# Patient Record
Sex: Female | Born: 1935
Health system: Southern US, Community
[De-identification: ages and names within clinical notes are randomized; demographics above are authoritative.]

## PROBLEM LIST (undated history)

## (undated) DIAGNOSIS — Z860100 Personal history of colon polyps, unspecified: Secondary | ICD-10-CM

## (undated) DIAGNOSIS — Z8619 Personal history of other infectious and parasitic diseases: Secondary | ICD-10-CM

## (undated) DIAGNOSIS — Z9289 Personal history of other medical treatment: Secondary | ICD-10-CM

## (undated) DIAGNOSIS — K449 Diaphragmatic hernia without obstruction or gangrene: Secondary | ICD-10-CM

## (undated) DIAGNOSIS — K766 Portal hypertension: Secondary | ICD-10-CM

## (undated) DIAGNOSIS — H9190 Unspecified hearing loss, unspecified ear: Secondary | ICD-10-CM

## (undated) DIAGNOSIS — K8050 Calculus of bile duct without cholangitis or cholecystitis without obstruction: Secondary | ICD-10-CM

## (undated) DIAGNOSIS — M51369 Other intervertebral disc degeneration, lumbar region without mention of lumbar back pain or lower extremity pain: Secondary | ICD-10-CM

## (undated) DIAGNOSIS — Z9889 Other specified postprocedural states: Secondary | ICD-10-CM

## (undated) DIAGNOSIS — K648 Other hemorrhoids: Secondary | ICD-10-CM

## (undated) DIAGNOSIS — K279 Peptic ulcer, site unspecified, unspecified as acute or chronic, without hemorrhage or perforation: Secondary | ICD-10-CM

## (undated) DIAGNOSIS — K5790 Diverticulosis of intestine, part unspecified, without perforation or abscess without bleeding: Secondary | ICD-10-CM

## (undated) DIAGNOSIS — N812 Incomplete uterovaginal prolapse: Secondary | ICD-10-CM

## (undated) DIAGNOSIS — I8500 Esophageal varices without bleeding: Secondary | ICD-10-CM

## (undated) DIAGNOSIS — K7469 Other cirrhosis of liver: Secondary | ICD-10-CM

## (undated) DIAGNOSIS — M5136 Other intervertebral disc degeneration, lumbar region: Secondary | ICD-10-CM

## (undated) DIAGNOSIS — D126 Benign neoplasm of colon, unspecified: Secondary | ICD-10-CM

## (undated) DIAGNOSIS — M1990 Unspecified osteoarthritis, unspecified site: Secondary | ICD-10-CM

## (undated) DIAGNOSIS — K219 Gastro-esophageal reflux disease without esophagitis: Secondary | ICD-10-CM

## (undated) DIAGNOSIS — E785 Hyperlipidemia, unspecified: Secondary | ICD-10-CM

## (undated) DIAGNOSIS — D509 Iron deficiency anemia, unspecified: Secondary | ICD-10-CM

## (undated) DIAGNOSIS — Z973 Presence of spectacles and contact lenses: Secondary | ICD-10-CM

## (undated) DIAGNOSIS — Z974 Presence of external hearing-aid: Secondary | ICD-10-CM

## (undated) HISTORY — DX: Other intervertebral disc degeneration, lumbar region without mention of lumbar back pain or lower extremity pain: M51.369

## (undated) HISTORY — DX: Other intervertebral disc degeneration, lumbar region: M51.36

## (undated) HISTORY — DX: Portal hypertension: K76.6

## (undated) HISTORY — DX: Benign neoplasm of colon, unspecified: D12.6

## (undated) HISTORY — DX: Diaphragmatic hernia without obstruction or gangrene: K44.9

## (undated) HISTORY — DX: Diverticulosis of intestine, part unspecified, without perforation or abscess without bleeding: K57.90

## (undated) HISTORY — DX: Esophageal varices without bleeding: I85.00

## (undated) HISTORY — DX: Other hemorrhoids: K64.8

## (undated) HISTORY — DX: Calculus of bile duct without cholangitis or cholecystitis without obstruction: K80.50

## (undated) HISTORY — DX: Unspecified osteoarthritis, unspecified site: M19.90

## (undated) HISTORY — DX: Other cirrhosis of liver: K74.69

## (undated) HISTORY — DX: Gastro-esophageal reflux disease without esophagitis: K21.9

---

## 1958-11-14 HISTORY — PX: APPENDECTOMY: SHX54

## 1978-03-15 HISTORY — PX: EXTERNAL EAR SURGERY: SHX627

## 1988-03-15 HISTORY — PX: LUMBAR DISC SURGERY: SHX700

## 1999-01-13 ENCOUNTER — Encounter (INDEPENDENT_AMBULATORY_CARE_PROVIDER_SITE_OTHER): Payer: Self-pay | Admitting: *Deleted

## 1999-01-13 ENCOUNTER — Ambulatory Visit (HOSPITAL_COMMUNITY): Admission: RE | Admit: 1999-01-13 | Discharge: 1999-01-13 | Payer: Self-pay | Admitting: *Deleted

## 1999-01-25 ENCOUNTER — Encounter: Payer: Self-pay | Admitting: Emergency Medicine

## 1999-01-26 ENCOUNTER — Inpatient Hospital Stay (HOSPITAL_COMMUNITY): Admission: EM | Admit: 1999-01-26 | Discharge: 1999-01-27 | Payer: Self-pay | Admitting: Emergency Medicine

## 2000-01-29 ENCOUNTER — Other Ambulatory Visit: Admission: RE | Admit: 2000-01-29 | Discharge: 2000-01-29 | Payer: Self-pay | Admitting: *Deleted

## 2000-09-20 ENCOUNTER — Emergency Department (HOSPITAL_COMMUNITY): Admission: EM | Admit: 2000-09-20 | Discharge: 2000-09-20 | Payer: Self-pay

## 2002-05-19 ENCOUNTER — Encounter: Payer: Self-pay | Admitting: Emergency Medicine

## 2002-05-19 ENCOUNTER — Emergency Department (HOSPITAL_COMMUNITY): Admission: EM | Admit: 2002-05-19 | Discharge: 2002-05-19 | Payer: Self-pay | Admitting: Emergency Medicine

## 2002-06-14 ENCOUNTER — Encounter: Payer: Self-pay | Admitting: Specialist

## 2002-06-19 ENCOUNTER — Ambulatory Visit (HOSPITAL_COMMUNITY): Admission: RE | Admit: 2002-06-19 | Discharge: 2002-06-19 | Payer: Self-pay | Admitting: Specialist

## 2002-06-19 HISTORY — PX: KNEE ARTHROSCOPY W/ MENISCECTOMY: SHX1879

## 2006-01-17 ENCOUNTER — Other Ambulatory Visit: Admission: RE | Admit: 2006-01-17 | Discharge: 2006-01-17 | Payer: Self-pay | Admitting: Family Medicine

## 2006-03-21 ENCOUNTER — Ambulatory Visit (HOSPITAL_COMMUNITY): Admission: RE | Admit: 2006-03-21 | Discharge: 2006-03-21 | Payer: Self-pay | Admitting: *Deleted

## 2006-03-21 ENCOUNTER — Encounter (INDEPENDENT_AMBULATORY_CARE_PROVIDER_SITE_OTHER): Payer: Self-pay | Admitting: *Deleted

## 2007-05-14 LAB — CONVERTED CEMR LAB

## 2008-06-18 ENCOUNTER — Encounter: Payer: Self-pay | Admitting: Family Medicine

## 2008-07-09 ENCOUNTER — Encounter (INDEPENDENT_AMBULATORY_CARE_PROVIDER_SITE_OTHER): Payer: Self-pay | Admitting: *Deleted

## 2008-07-09 ENCOUNTER — Ambulatory Visit (HOSPITAL_COMMUNITY): Admission: RE | Admit: 2008-07-09 | Discharge: 2008-07-09 | Payer: Self-pay | Admitting: *Deleted

## 2008-09-17 ENCOUNTER — Ambulatory Visit: Payer: Self-pay | Admitting: Family Medicine

## 2008-09-17 DIAGNOSIS — Q279 Congenital malformation of peripheral vascular system, unspecified: Secondary | ICD-10-CM | POA: Insufficient documentation

## 2008-09-17 DIAGNOSIS — M1990 Unspecified osteoarthritis, unspecified site: Secondary | ICD-10-CM

## 2008-09-17 DIAGNOSIS — Z860100 Personal history of colon polyps, unspecified: Secondary | ICD-10-CM | POA: Insufficient documentation

## 2008-09-17 DIAGNOSIS — M810 Age-related osteoporosis without current pathological fracture: Secondary | ICD-10-CM | POA: Insufficient documentation

## 2008-09-17 DIAGNOSIS — D509 Iron deficiency anemia, unspecified: Secondary | ICD-10-CM | POA: Insufficient documentation

## 2008-09-17 DIAGNOSIS — K219 Gastro-esophageal reflux disease without esophagitis: Secondary | ICD-10-CM | POA: Insufficient documentation

## 2008-09-17 DIAGNOSIS — K279 Peptic ulcer, site unspecified, unspecified as acute or chronic, without hemorrhage or perforation: Secondary | ICD-10-CM | POA: Insufficient documentation

## 2008-09-17 DIAGNOSIS — N812 Incomplete uterovaginal prolapse: Secondary | ICD-10-CM

## 2008-09-20 ENCOUNTER — Ambulatory Visit (HOSPITAL_COMMUNITY): Admission: RE | Admit: 2008-09-20 | Discharge: 2008-09-20 | Payer: Self-pay | Admitting: Family Medicine

## 2008-09-25 ENCOUNTER — Encounter (INDEPENDENT_AMBULATORY_CARE_PROVIDER_SITE_OTHER): Payer: Self-pay | Admitting: *Deleted

## 2008-10-18 ENCOUNTER — Telehealth: Payer: Self-pay | Admitting: Family Medicine

## 2009-06-04 ENCOUNTER — Telehealth: Payer: Self-pay | Admitting: Family Medicine

## 2009-06-06 ENCOUNTER — Ambulatory Visit: Payer: Self-pay | Admitting: Family Medicine

## 2009-06-06 DIAGNOSIS — E785 Hyperlipidemia, unspecified: Secondary | ICD-10-CM | POA: Insufficient documentation

## 2009-06-10 LAB — CONVERTED CEMR LAB
ALT: 22 units/L (ref 0–35)
Basophils Absolute: 0 10*3/uL (ref 0.0–0.1)
Bilirubin, Direct: 0.1 mg/dL (ref 0.0–0.3)
CO2: 31 meq/L (ref 19–32)
Chloride: 107 meq/L (ref 96–112)
Cholesterol: 201 mg/dL — ABNORMAL HIGH (ref 0–200)
Creatinine, Ser: 0.7 mg/dL (ref 0.4–1.2)
Direct LDL: 135.2 mg/dL
Eosinophils Absolute: 0.1 10*3/uL (ref 0.0–0.7)
Lymphocytes Relative: 24 % (ref 12.0–46.0)
MCHC: 33.4 g/dL (ref 30.0–36.0)
Neutrophils Relative %: 60.6 % (ref 43.0–77.0)
Platelets: 178 10*3/uL (ref 150.0–400.0)
RBC: 4.26 M/uL (ref 3.87–5.11)
RDW: 12.4 % (ref 11.5–14.6)
Total CHOL/HDL Ratio: 4
Total Protein: 6.1 g/dL (ref 6.0–8.3)

## 2009-06-11 LAB — CONVERTED CEMR LAB: Vit D, 25-Hydroxy: 25 ng/mL — ABNORMAL LOW (ref 30–89)

## 2009-06-13 ENCOUNTER — Ambulatory Visit: Payer: Self-pay | Admitting: Family Medicine

## 2009-06-13 DIAGNOSIS — I10 Essential (primary) hypertension: Secondary | ICD-10-CM | POA: Insufficient documentation

## 2009-06-13 LAB — CONVERTED CEMR LAB: Potassium: 4.1 meq/L (ref 3.5–5.1)

## 2009-06-24 ENCOUNTER — Ambulatory Visit: Payer: Self-pay | Admitting: Family Medicine

## 2009-06-24 DIAGNOSIS — E559 Vitamin D deficiency, unspecified: Secondary | ICD-10-CM | POA: Insufficient documentation

## 2009-09-12 ENCOUNTER — Ambulatory Visit: Payer: Self-pay | Admitting: Family Medicine

## 2009-09-17 ENCOUNTER — Encounter (INDEPENDENT_AMBULATORY_CARE_PROVIDER_SITE_OTHER): Payer: Self-pay | Admitting: *Deleted

## 2009-09-17 DIAGNOSIS — E875 Hyperkalemia: Secondary | ICD-10-CM

## 2009-09-17 LAB — CONVERTED CEMR LAB
ALT: 20 units/L (ref 0–35)
Albumin: 4 g/dL (ref 3.5–5.2)
CO2: 30 meq/L (ref 19–32)
Calcium: 9.1 mg/dL (ref 8.4–10.5)
Creatinine, Ser: 0.6 mg/dL (ref 0.4–1.2)
Glucose, Bld: 98 mg/dL (ref 70–99)
Total Protein: 6.3 g/dL (ref 6.0–8.3)

## 2009-09-23 ENCOUNTER — Ambulatory Visit (HOSPITAL_COMMUNITY): Admission: RE | Admit: 2009-09-23 | Discharge: 2009-09-23 | Payer: Self-pay | Admitting: Family Medicine

## 2009-09-23 ENCOUNTER — Telehealth: Payer: Self-pay | Admitting: Family Medicine

## 2009-09-23 ENCOUNTER — Encounter: Payer: Self-pay | Admitting: Family Medicine

## 2009-09-23 LAB — HM MAMMOGRAPHY: HM Mammogram: NEGATIVE

## 2009-09-26 ENCOUNTER — Ambulatory Visit: Payer: Self-pay | Admitting: Family Medicine

## 2009-09-29 LAB — CONVERTED CEMR LAB: Potassium: 5 meq/L (ref 3.5–5.1)

## 2010-04-13 ENCOUNTER — Encounter: Payer: Self-pay | Admitting: Family Medicine

## 2010-04-16 NOTE — Assessment & Plan Note (Signed)
Summary: CPX  CYD   Vital Signs:  Patient profile:   77 year old female Height:      59.5 inches Weight:      113 pounds BMI:     22.52 Temp:     98.0 degrees F oral Pulse rate:   72 / minute Pulse rhythm:   regular BP sitting:   118 / 78  (right arm) Cuff size:   regular  Vitals Entered By: Linde Gillis CMA Duncan Dull) (June 24, 2009 11:46 AM) CC: 30 minute exam, Hypertension Management   History of Present Illness: Doing well overall. Here for management of chornic issues.   Hypertension History:      She denies headache, chest pain, palpitations, dyspnea with exertion, orthopnea, peripheral edema, neurologic problems, syncope, and side effects from treatment.  Well controlled at home. Marland Kitchen        Positive major cardiovascular risk factors include female age 22 years old or older, hyperlipidemia, and hypertension.  Negative major cardiovascular risk factors include non-tobacco-user status.     Problems Prior to Update: 1)  Essential Hypertension, Benign  (ICD-401.1) 2)  Screening For Lipoid Disorders  (ICD-V77.91) 3)  Colonic Polyps, Benign, Hx of  (ICD-V12.72) 4)  Arteriovenous Malformation  (ICD-747.60) 5)  Other Screening Mammogram  (ICD-V76.12) 6)  Uterovaginal Prolapse, Incomplete  (ICD-618.2) 7)  Osteoporosis  (ICD-733.00) 8)  Positive Tb Skin Test, Without Tuberculosis  (ICD-795.5) 9)  Hx of Peptic Ulcer Disease  (ICD-533.90) 10)  Gerd  (ICD-530.81) 11)  Osteoarthritis  (ICD-715.90) 12)  Anemia-iron Deficiency  (ICD-280.9)  Current Medications (verified): 1)  Omeprazole 20 Mg Cpdr (Omeprazole) .... Take 1 Tablet By Mouth Two Times A Day 2)  Alendronate Sodium 70 Mg Tabs (Alendronate Sodium) .... Take 1 Tablet By Mouth Each Week. 3)  Calcium Carbonate-Vitamin D 600-400 Mg-Unit  Tabs (Calcium Carbonate-Vitamin D) .... Take 1 Tablet By Mouth Once A Day 4)  Multivitamins   Tabs (Multiple Vitamin) .... Take 1 Tablet By Mouth Once A Day 5)  Ferrous Sulfate 325 (65 Fe) Mg   Tabs (Ferrous Sulfate) .... 50 Mg. Take 1 Tablet By Mouth Three Times A Day 6)  Ring Pessary  Misc (Misc. Devices)  Allergies (verified): No Known Drug Allergies  Past History:  Past medical, surgical, family and social histories (including risk factors) reviewed, and no changes noted (except as noted below).  Past Medical History: Reviewed history from 09/17/2008 and no changes required. Anemia-iron deficiency Osteoarthritis, hands, knees and low back GERD Peptic ulcer disease Osteoporosis  Past Surgical History: Reviewed history from 09/17/2008 and no changes required. Ear surgery, left side 1980s Lumbar spine, ruptured disc 1990 knee surgery, arthroscopic 2004 Appendectomy  Family History: Reviewed history from 09/17/2008 and no changes required. father: CAD age 63s, high chol, DM mother: CAD late in life 8 sisters: liver issue, cirrhosis due to medicaitonin one sister and 1 brother, DM 3 brother:CAD, DM  Social History: Reviewed history from 09/17/2008 and no changes required. Retired: [employer] Married 3 grown children Never Smoked Alcohol use-no Drug use-no Regular exercise-yes, walk Diet: fruits and veggies  Review of Systems General:  Denies fatigue and fever. CV:  Denies chest pain or discomfort. Resp:  Denies shortness of breath. GI:  Denies abdominal pain, bloody stools, constipation, and diarrhea. GU:  Denies dysuria. Derm:  Denies rash. Psych:  Denies anxiety and depression.   Impression & Recommendations:  Problem # 1:  ESSENTIAL HYPERTENSION, BENIGN (ICD-401.1) Well controlled. Continue current medication. Encouraged exercise, weight loss,  healthy eating habits.   Problem # 2:  UNSPECIFIED VITAMIN D DEFICIENCY (ICD-268.9) Low vit D. Replete weekly x 6-12 weeks.  Orders: Prescription Created Electronically [phone])  Problem # 3:  OSTEOPOROSIS (ICD-733.00) Continue current meds. Add regular weight bearing exercise.  Her updated medication  list for this problem includes:    Alendronate Sodium 70 Mg Tabs (Alendronate sodium) .Marland Kitchen... Take 1 tablet by mouth each week.    Calcium Carbonate-vitamin D 600-400 Mg-unit Tabs (Calcium carbonate-vitamin d) .Marland Kitchen... Take 1 tablet by mouth once a day    Vitamin D (ergocalciferol) 50000 Unit Caps (Ergocalciferol) .Marland Kitchen... 1 tab by mouth weekly  Orders: Radiology Referral (Radiology)  Problem # 4:  GERD (ICD-530.81) Well controlled. Continue current medication.  Her updated medication list for this problem includes:    Omeprazole 20 Mg Cpdr (Omeprazole) .Marland Kitchen... Take 1 tablet by mouth two times a day  Problem # 5:  HYPERLIPIDEMIA (ICD-272.4)  Encouraged exercise, weight loss, healthy eating habits. Discussed low cholesterol diet. almost at goal LDL <130.  Recheck in 3 months.  Labs Reviewed: SGOT: 36 (06/06/2009)   SGPT: 22 (06/06/2009)  10 Yr Risk Heart Disease: 6 %   HDL:56.70 (06/06/2009)  LDL:135 (06/06/2009), 135 (06/06/2009)  Chol:201 (06/06/2009)  Trig:75.0 (06/06/2009)  Problem # 6:  Preventive Health Care (ICD-V70.0) Assessment: Comment Only  Reviewed preventive care protocols, scheduled due services, and updated immunizations.   Complete Medication List: 1)  Omeprazole 20 Mg Cpdr (Omeprazole) .... Take 1 tablet by mouth two times a day 2)  Alendronate Sodium 70 Mg Tabs (Alendronate sodium) .... Take 1 tablet by mouth each week. 3)  Calcium Carbonate-vitamin D 600-400 Mg-unit Tabs (Calcium carbonate-vitamin d) .... Take 1 tablet by mouth once a day 4)  Multivitamins Tabs (Multiple vitamin) .... Take 1 tablet by mouth once a day 5)  Ferrous Sulfate 325 (65 Fe) Mg Tabs (Ferrous sulfate) .... 50 mg. take 1 tablet by mouth three times a day 6)  Ring Pessary Misc (Misc. devices) 7)  Vitamin D (ergocalciferol) 50000 Unit Caps (Ergocalciferol) .Marland Kitchen.. 1 tab by mouth weekly  Hypertension Assessment/Plan:      The patient's hypertensive risk group is category B: At least one risk factor  (excluding diabetes) with no target organ damage.  Her calculated 10 year risk of coronary heart disease is 6 %.  Today's blood pressure is 118/78.  Her blood pressure goal is < 140/90.  Patient Instructions: 1)  Referral Appointment Information 2)  Day/Date: 3)  Time: 4)  Place/MD: 5)  Address: 6)  Phone/Fax: 7)  Patient given appointment information. Information/Orders faxed/mailed.  8)  May return to healthy diet, limit bannanas though.  9)  Start regular walking for exercsie.  10)  IN 3 month return for BMET, vit D Dx 401.1 Prescriptions: VITAMIN D (ERGOCALCIFEROL) 50000 UNIT CAPS (ERGOCALCIFEROL) 1 tab by mouth weekly  #6 x 0   Entered and Authorized by:   Kerby Nora MD   Signed by:   Kerby Nora MD on 06/24/2009   Method used:   [facility]  [place] [id number]       [address]       [place], [place]  [address]       Ph: [phone] or [phone]       Fax: [phone]   RxID:   [id number]   Current Allergies (reviewed today): No known allergies   Flu Vaccine Result Date:  01/13/2009 Flu Vaccine Result:  given Flu Vaccine Next Due:  1 yr LDL Result Date:  06/06/2009 LDL Result:  135 Colonoscopy Result Date:  11/13/2008 Colonoscopy Result:  normal Colonoscopy Next Due:  Not Indicated  Appended Document: Office Visit ([facility])      Allergies: No Known Drug Allergies    Physical Exam  General:  elderly thin appearing female in NAd Ears:  left hearing aid, no external deformities, nml TMs Nose:  External nasal examination shows no deformity or inflammation. Nasal mucosa are pink and moist without lesions or exudates. Mouth:  Dentures upper, pharynx pink and moist.   Neck:  no carotid bruit or thyromegaly no cervical or supraclavicular lymphadenopathy  Lungs:  Normal respiratory effort, chest expands symmetrically. Lungs are clear to auscultation, no crackles or wheezes. Heart:  Normal rate and regular rhythm.  S1 and S2 normal without gallop, murmur, click, rub or other extra sounds. Abdomen:  Bowel sounds positive,abdomen soft and non-tender without masses, organomegaly or hernias noted. Genitalia:  DVE: pessaryin place.normal introitus, no external lesions, no vaginal discharge, normal uterus size and position, and no adnexal masses or tenderness.   Pulses:  R and L posterior tibial pulses are full and equal bilaterally  Extremities:  no edmea  Skin:  Intact without suspicious lesions or rashes Psych:  Cognition and judgment appear intact. Alert and cooperative with normal attention span and concentration. No apparent delusions, illusions, hallucinations   Complete Medication List: 1)  Omeprazole 20 Mg Cpdr (Omeprazole) .... Take 1 tablet by mouth two times a day 2)  Alendronate Sodium 70 Mg Tabs (Alendronate sodium) .... Take 1 tablet by mouth each week. 3)  Calcium Carbonate-vitamin D 600-400 Mg-unit Tabs (Calcium carbonate-vitamin d) .... Take 1 tablet by mouth once a day 4)  Multivitamins Tabs (Multiple vitamin) .... Take 1 tablet by mouth once a day 5)  Ferrous Sulfate 325 (65 Fe) Mg Tabs (Ferrous sulfate) .... 50 mg. take 1 tablet by mouth three times a day 6)  Ring Pessary Misc (Misc. devices) 7)  Vitamin D (ergocalciferol) 50000 Unit Caps (Ergocalciferol) .Marland Kitchen.. 1 tab by mouth weekly

## 2010-04-16 NOTE — Progress Notes (Signed)
Summary: labs prior to appt?  Phone Note Call from Patient Call back at [phone]   Caller: Patient Call For: Kerby Nora MD Summary of Call: Pt has upcoming physical and she is asking if she should have labs prior to that appt.  Please advise. Initial call taken by: Lowella Petties CMA,  June 04, 2009 11:17 AM  Follow-up for Phone Call        CMET, LIPIDS fasting Dx v77.91, cbc Dx 280.9, vit D  Dx 733.0 Follow-up by: Kerby Nora MD,  June 04, 2009 11:21 AM  Additional Follow-up for Phone Call Additional follow up Details #1::        06-06-2009-8:40am  Patient advised.Consuello Masse CMA  Additional Follow-up by: Benny Lennert CMA Duncan Dull),  June 04, 2009 11:25 AM

## 2010-04-16 NOTE — Letter (Signed)
Summary: Generic Letter  [place] [facility]  [address] [place], [place] [address]   Phone: [phone]  Fax: [phone]    09/17/2009     Brianna Barnes [address] [place], [place]  [address]    Dear [name],  I tried to reach you by phone can you please call the office and schedule this as soon as possible...    Notify pt potasssium is high again.. Limit potassium supplement, multivitamin with potassium and high potassium. foods...recheck  potassium  in 2 days  Dx 276.7 Also LIPIDS were not ordered...have her fast when returns for labs and check LIPIDs Dx 272.0 Kerby Nora MD  September 17, 2009 2:17 PM   Vitamin D is in normal range you may now take calcium/600 and vitamin d 400 daily over the counter      Sincerely,   Benny Lennert CMA (AAMA)

## 2010-04-16 NOTE — Letter (Signed)
Summary: Generic Letter  [place] [facility]  [address] [place], [place] [address]   Phone: [phone]  Fax: [phone]    06/13/2009  ADIANNA DARWIN [address] [place], [place]  [address]  Dear Ms. Knittle,  This letter is to notify you that your potassium level is now within the normal range.  Please call our office with an updated phone number.  The number we have listed is disconnected.      Sincerely,   Linde Gillis CMA (AAMA)

## 2010-04-16 NOTE — Progress Notes (Signed)
Summary: Need medication refill  Phone Note Refill Request Call back at [phone]   Refills Requested: Medication #1:  ALENDRONATE SODIUM 70 MG TABS Take 1 tablet by mouth each week. Pt had cpx 3 mths ago. need refill on Alendronate Sodium 70mg  [facility] [place]   Method Requested: Electronic Initial call taken by: Daine Gip,  September 23, 2009 2:00 PM  Follow-up for Phone Call        Medicine sent to pharmacy. Follow-up by: Lowella Petties CMA,  September 23, 2009 2:08 PM    Prescriptions: ALENDRONATE SODIUM 70 MG TABS (ALENDRONATE SODIUM) Take 1 tablet by mouth each week.  #12 x 3   Entered by:   Lowella Petties CMA   Authorized by:   Kerby Nora MD   Signed by:   Lowella Petties CMA on 09/23/2009   Method used:   [facility]  [place]       [address]       [place], [place]  [address]       Ph: [phone] or [phone]       Fax: [phone]   RxID:   [id number]

## 2010-04-30 NOTE — Letter (Signed)
Summary: Historic Patient File  Historic Patient File   Imported By: Kassie Mends 04/21/2010 10:35:49  _____________________________________________________________________  External Attachment:    Type:   Image     Comment:   External Document

## 2010-06-27 ENCOUNTER — Other Ambulatory Visit: Payer: Self-pay | Admitting: Family Medicine

## 2010-07-28 NOTE — Op Note (Signed)
NAMEIDELL, HISSONG                ACCOUNT NO.:  [id number]   MEDICAL RECORD NO.:  [id number]          PATIENT TYPE:  AMB   LOCATION:  ENDO                         FACILITY:  [facility]   PHYSICIAN:  Georgiana Spinner, M.D.    DATE OF BIRTH:  Aug 12, 1928   DATE OF PROCEDURE:  07/09/2008  DATE OF DISCHARGE:                               OPERATIVE REPORT   PROCEDURE:  Colonoscopy with biopsy.   INDICATIONS:  Colon polyps.   ANESTHESIA:  Fentanyl 75 mcg, Versed 5 mg.   PROCEDURE:  With the patient mildly sedated in the left lateral  decubitus position, the Pentax pediatric colonoscope was inserted in the  rectum and passed under direct vision to the cecum identified by  ileocecal valve and appendiceal orifice, both which were photographed.  From this point, the colonoscope was slowly withdrawn taking  circumferential views of colonic mucosa, stopping first in the  transverse colon where a flat polyp was seen, photographed and removed  using hot biopsy forceps technique setting 20/150 blended current.  We  next stopped in the descending colon where a second polyp was seen.  It,  too, was photographed and removed in the same manner as was the third  polyp seen in the rectosigmoid area.  All were removed in the same  manner and retrieved for pathology.  Endoscope was withdrawn to the  rectum which appeared normal on direct and retroflexed view.  The  endoscope was straightened and withdrawn.  The patient's vital signs and  pulse oximeter remained stable.  The patient tolerated procedure well  without apparent complication.   FINDINGS:  Polyps of transverse colon, descending colon and rectosigmoid  area.  Await biopsy report.  The patient will call me for results and  follow-up with me as an outpatient.           ______________________________  Georgiana Spinner, M.D.     GMO/MEDQ  D:  07/09/2008  T:  07/09/2008  Job:  [id number]

## 2010-07-31 NOTE — Op Note (Signed)
NAMESHANETTE, TAMARGO NO.:  [id number]   MEDICAL RECORD NO.:  [id number]          PATIENT TYPE:  AMB   LOCATION:  ENDO                         FACILITY:  [facility]   PHYSICIAN:  Georgiana Spinner, M.D.    DATE OF BIRTH:  [birth year]   DATE OF PROCEDURE:  03/21/2006  DATE OF DISCHARGE:                               OPERATIVE REPORT   SURGEON:  Georgiana Spinner, M.D.   PROCEDURE:  Colonoscopy.   INDICATIONS:  Hemoccult positivity.   DESCRIPTION OF PROCEDURE:  With the patient mildly sedated in the left  lateral decubitus position, the Pentax videoscopic pediatric colonoscope  was inserted into the rectum and passed under direct vision to the  cecum, identified by the ileocecal valve and appendiceal orifice, both  of which were photographed.  From this point, the colonoscope was slowly  withdrawn, taking circumferential views of colonic mucosa, stopping  first at 50 cm from the anal verge, at which point 2 small polyps were  seen, photographed and removed using hot biopsy forceps technique.  We  next stopped at 20 cm from the anal verge, when 2 more polyps were seen,  photographed and removed using hot biopsy forceps technique.  And we  lastly stopped in the rectum, where a small polyp was seen on direct  view, which was removed using hot biopsy forceps technique, all with a  setting of 20/200 blended current.  Retroflex view showed internal  hemorrhoids.  The endoscope was standard and withdrawn.  The patient's  vital signs and pulse oximetry remained stable.  The patient tolerated  the procedure well and without apparent complications.   FINDINGS:  Colon polyps as noted at 50 and 20 cm from the anal verge,  and in the rectum.  Internal hemorrhoids were also noted.   PLAN:  Await biopsy report.  The patient will call me for results and  follow up with me as an outpatient.  See endoscopy note for further  details of followup.           ______________________________  Georgiana Spinner, M.D.     GMO/MEDQ  D:  03/21/2006  T:  03/21/2006  Job:  [id number]   cc:   Stacie Acres. Cliffton Asters, M.D.

## 2010-07-31 NOTE — Op Note (Signed)
NAME:  Brianna Barnes, Brianna Barnes                          ACCOUNT NO.:  [id number]   MEDICAL RECORD NO.:  [id number]                   PATIENT TYPE:  AMB   LOCATION:  DAY                                  FACILITY:  [facility]   PHYSICIAN:  Ronnell Guadalajara, M.D.                DATE OF BIRTH:  1928/06/02   DATE OF PROCEDURE:  06/19/2002  DATE OF DISCHARGE:                                 OPERATIVE REPORT   PREOPERATIVE DIAGNOSIS:  Torn medial meniscus, left knee.   POSTOPERATIVE DIAGNOSIS:  Torn medial meniscus, left knee.   OPERATIVE PROCEDURE:  Partial medial meniscectomy.   SURGEON:  Deloris Ping. Montez Morita, M.D.   DESCRIPTION OF PROCEDURE:  After suitable general anesthesia, the leg was  exsanguinated with an Esmarch, and an upper thigh tourniquet was inflated to  350 mmHg.  The leg was then placed in the arthroscopy leg holder and prepped  and draped routinely.  An arthroscope was brought in through an  anterolateral portal with the portals injected with 1% Xylocaine with  adrenalin, and the inflow was connected to it and brought up into the  suprapatellar pouch where the patella appeared to look quite good in both it  and the groove on the femur where it articulates.  We then took a look at  the lateral meniscus, and the lateral joint looked quite good.  Pertinent  pathology was confined to the medial joint space where there was not a great  deal of arthritic wear which was good but a terrible tear of the meniscus  and at the junction of the posterior third and the middle third with a long  flap extending posteriorly in the posterior horn and a small flap extending  far from the middle.  It was necessary to remove this whole large flap with  a combination of small and large punch forceps and removed them with a 4.2  burrowing meniscotome.  This large meniscotome was then used to trim up the  anteromedial flap.  Then we brought in the small 3.5 rotary meniscotome to  trim.  She was left with a piece  of meniscus that extended all the way  posteriorly.  There was a good stable rim.  In coming forward, she had two-  thirds of the meniscus basically intact with a trim done on that middle  third to taper it as it went back toward the posterior portion.  At the end  of the procedure, the three wounds were sutured with nylon, and 20 mL of  0.5% plain Marcaine were put into the knee with adrenalin.  All of the  portals were injected with the 1% Xylocaine with a little adrenalin.  Tourniquet was let down at 30 minutes, nice compression dressing, ice pack.  She goes to recovery in good condition.  Ronnell Guadalajara, M.D.    PC/MEDQ  D:  06/19/2002  T:  06/19/2002  Job:  [id number]

## 2010-07-31 NOTE — Op Note (Signed)
Brianna Barnes, Brianna Barnes                ACCOUNT NO.:  [id number]   MEDICAL RECORD NO.:  [id number]          PATIENT TYPE:  AMB   LOCATION:  ENDO                         FACILITY:  [facility]   PHYSICIAN:  Georgiana Spinner, M.D.    DATE OF BIRTH:  [birth year]   DATE OF PROCEDURE:  03/21/2006  DATE OF DISCHARGE:                               OPERATIVE REPORT   PROCEDURE:  Upper endoscopy.   INDICATIONS:  Hemoccult positivity.   ANESTHESIA:  Fentanyl 50 mcg and Versed 4 mg.   PROCEDURE:  With the patient mildly sedated in the left lateral  decubitus position, the Pentax videoscopic endoscope was inserted in the  mouth, passed under direct vision through the esophagus which appeared  normal until we reached distal esophagus and there were questionable  changes of Barrett's photographed and biopsied.  We entered into the  stomach.  The fundus, body and antrum appeared normal.  Duodenal bulb  and second portion duodenum were visualized and four small AVMs were  noted and photographed.  From this point, the endoscope was slowly  withdrawn taking circumferential views of duodenal mucosa until the  endoscope had been pulled back into the stomach, placed in retroflexion  to view the stomach from below.  The endoscope was straightened and  withdrawn taking circumferential views of the remaining gastric and  esophageal mucosa.  The patient's vital signs and pulse oximeter  remained stable.  The patient tolerated the procedure well without  apparent complication.   FINDINGS:  AVMs as noted, question of Barrett's esophagus biopsied.  Await biopsy report.  The patient will call me for results and follow-up  with me as an outpatient.  Proceed to colonoscopy.           ______________________________  Georgiana Spinner, M.D.     GMO/MEDQ  D:  03/21/2006  T:  03/21/2006  Job:  [id number]   cc:   Stacie Acres. Cliffton Asters, M.D.

## 2010-09-06 ENCOUNTER — Other Ambulatory Visit: Payer: Self-pay | Admitting: Family Medicine

## 2010-09-07 NOTE — Telephone Encounter (Signed)
Overdue for CPX.Marland Kitchen Schedule and refill until that time.

## 2010-09-07 NOTE — Telephone Encounter (Signed)
Patient not seen in 1 year please advise

## 2010-10-06 ENCOUNTER — Encounter: Payer: Self-pay | Admitting: Family Medicine

## 2010-10-21 ENCOUNTER — Encounter: Payer: Self-pay | Admitting: Family Medicine

## 2010-10-21 LAB — HM SIGMOIDOSCOPY

## 2010-10-27 ENCOUNTER — Encounter: Payer: Self-pay | Admitting: Family Medicine

## 2010-10-27 ENCOUNTER — Telehealth: Payer: Self-pay | Admitting: Family Medicine

## 2010-10-27 DIAGNOSIS — D509 Iron deficiency anemia, unspecified: Secondary | ICD-10-CM

## 2010-10-27 DIAGNOSIS — E559 Vitamin D deficiency, unspecified: Secondary | ICD-10-CM

## 2010-10-27 DIAGNOSIS — M810 Age-related osteoporosis without current pathological fracture: Secondary | ICD-10-CM

## 2010-10-27 DIAGNOSIS — E875 Hyperkalemia: Secondary | ICD-10-CM

## 2010-10-27 DIAGNOSIS — E785 Hyperlipidemia, unspecified: Secondary | ICD-10-CM

## 2010-10-27 NOTE — Telephone Encounter (Signed)
Message copied by Excell Seltzer on Tue Oct 27, 2010  5:14 PM ------      Message from: Alvina Chou      Created: Tue Oct 27, 2010 12:30 PM       Patient is scheduled for CPX labs Wednesday,8-15, please order future labs, Thanks , Brianna Barnes

## 2010-10-28 ENCOUNTER — Other Ambulatory Visit (INDEPENDENT_AMBULATORY_CARE_PROVIDER_SITE_OTHER): Payer: Medicare Other | Admitting: Family Medicine

## 2010-10-28 DIAGNOSIS — E785 Hyperlipidemia, unspecified: Secondary | ICD-10-CM

## 2010-10-28 DIAGNOSIS — D509 Iron deficiency anemia, unspecified: Secondary | ICD-10-CM

## 2010-10-28 DIAGNOSIS — M810 Age-related osteoporosis without current pathological fracture: Secondary | ICD-10-CM

## 2010-10-28 DIAGNOSIS — E875 Hyperkalemia: Secondary | ICD-10-CM

## 2010-10-28 DIAGNOSIS — E559 Vitamin D deficiency, unspecified: Secondary | ICD-10-CM

## 2010-10-28 LAB — LIPID PANEL
HDL: 55.3 mg/dL (ref >39.00)
Total CHOL/HDL Ratio: 3
VLDL: 12.6 mg/dL (ref 0.0–40.0)

## 2010-10-28 LAB — CBC WITH DIFFERENTIAL/PLATELET
Basophils Relative: 0.7 % (ref 0.0–3.0)
Eosinophils Relative: 3.4 % (ref 0.0–5.0)
HCT: 38.5 % (ref 36.0–46.0)
Lymphs Abs: 1.1 10*3/uL (ref 0.7–4.0)
MCV: 93 fl (ref 78.0–100.0)
Monocytes Absolute: 0.4 10*3/uL (ref 0.1–1.0)
RBC: 4.14 Mil/uL (ref 3.87–5.11)
WBC: 3.9 10*3/uL — ABNORMAL LOW (ref 4.5–10.5)

## 2010-10-28 LAB — COMPREHENSIVE METABOLIC PANEL
AST: 40 U/L — ABNORMAL HIGH (ref 0–37)
Albumin: 3.9 g/dL (ref 3.5–5.2)
Alkaline Phosphatase: 68 U/L (ref 39–117)
BUN: 11 mg/dL (ref 6–23)
Creatinine, Ser: 0.7 mg/dL (ref 0.4–1.2)
Potassium: 4.4 mEq/L (ref 3.5–5.1)

## 2010-10-29 ENCOUNTER — Ambulatory Visit (INDEPENDENT_AMBULATORY_CARE_PROVIDER_SITE_OTHER): Payer: Medicare Other | Admitting: Family Medicine

## 2010-10-29 ENCOUNTER — Encounter: Payer: Self-pay | Admitting: Family Medicine

## 2010-10-29 DIAGNOSIS — R7303 Prediabetes: Secondary | ICD-10-CM

## 2010-10-29 DIAGNOSIS — E785 Hyperlipidemia, unspecified: Secondary | ICD-10-CM

## 2010-10-29 DIAGNOSIS — E559 Vitamin D deficiency, unspecified: Secondary | ICD-10-CM

## 2010-10-29 DIAGNOSIS — Z0000 Encounter for general adult medical examination without abnormal findings: Secondary | ICD-10-CM

## 2010-10-29 DIAGNOSIS — Z8249 Family history of ischemic heart disease and other diseases of the circulatory system: Secondary | ICD-10-CM | POA: Insufficient documentation

## 2010-10-29 DIAGNOSIS — I10 Essential (primary) hypertension: Secondary | ICD-10-CM

## 2010-10-29 DIAGNOSIS — Z1231 Encounter for screening mammogram for malignant neoplasm of breast: Secondary | ICD-10-CM

## 2010-10-29 DIAGNOSIS — D509 Iron deficiency anemia, unspecified: Secondary | ICD-10-CM

## 2010-10-29 DIAGNOSIS — M810 Age-related osteoporosis without current pathological fracture: Secondary | ICD-10-CM

## 2010-10-29 NOTE — Assessment & Plan Note (Addendum)
Discussed cardiac prevention. Start baby aspirin. Optimize HDL, start fish oil daily.  EKG today NSR. Assymptomatic Offered referral for cardiology.Marland Kitchen She will consider.

## 2010-10-29 NOTE — Assessment & Plan Note (Signed)
Stable on vit D supplementation.

## 2010-10-29 NOTE — Progress Notes (Signed)
Subjective:    Patient ID: Brianna Barnes, female    DOB: [birth year], 77 y.o.   MRN: [id number]  HPI I have personally reviewed the Medicare Annual Wellness questionnaire and have noted 1. The patient's medical and social history 2. Their use of alcohol, tobacco or illicit drugs 3. Their current medications and supplements 4. The patient's functional ability including ADL's, fall risks, home safety risks and hearing or visual             impairment. 5. Diet and physical activities 6. Evidence for depression or mood disorders The patients weight, height, BMI and visual acuity have been recorded in the chart I have made referrals, counseling and provided education to the patient based review of the above and I have provided the pt with a written personalized care plan for preventive services.  Hypertension:  Well controlled on no medication.  Using medication without problems or lightheadedness:  Chest pain with exertion:None Edema:None Short of breath:None Average home BPs: Other issues: Sister age 55 recetnyl diagnosed with CAD.Marland Kitchen Had CABG x 4.  Elevated Cholesterol: Well controlled on no medicition.  Osteoporosis: On fosamax. Vit D def, resolved s/p supplementation with rx dosing. On twice daily 400 mg D3  She is concerned about sisters recent CAD dx. No current sympotms. Last catherterization 20 years.. Looked normal.  Last EKG 2009ish.. nml.  2 sisters with liver disease, unknown type...today LFT slightly elevated.      Review of Systems  Constitutional: Negative for fever and fatigue.  HENT: Negative for ear pain.   Eyes: Negative for pain.  Respiratory: Negative for cough, shortness of breath and wheezing.   Cardiovascular: Negative for chest pain, palpitations and leg swelling.  Gastrointestinal: Positive for constipation. Negative for nausea, abdominal pain, diarrhea and blood in stool.       Occassionally  Genitourinary: Negative for dysuria, vaginal bleeding and  vaginal pain.  Skin: Negative for rash.  Neurological: Negative for dizziness, syncope and light-headedness.  Psychiatric/Behavioral: Negative for dysphoric mood. The patient is not nervous/anxious.        Objective:   Physical Exam  Constitutional: Vital signs are normal. She appears well-developed and well-nourished. She is cooperative.  Non-toxic appearance. She does not appear ill. No distress.  HENT:  Head: Normocephalic.  Right Ear: Hearing, tympanic membrane, external ear and ear canal normal.  Left Ear: Hearing, tympanic membrane, external ear and ear canal normal.  Nose: Nose normal.  Eyes: Conjunctivae, EOM and lids are normal. Pupils are equal, round, and reactive to light. No foreign bodies found.  Neck: Trachea normal and normal range of motion. Neck supple. Carotid bruit is not present. No mass and no thyromegaly present.  Cardiovascular: Normal rate, regular rhythm, S1 normal, S2 normal, normal heart sounds and intact distal pulses.  Exam reveals no gallop.   No murmur heard. Pulmonary/Chest: Effort normal and breath sounds normal. No respiratory distress. She has no wheezes. She has no rhonchi. She has no rales.  Abdominal: Soft. Normal appearance and bowel sounds are normal. She exhibits no distension, no fluid wave, no abdominal bruit and no mass. There is no hepatosplenomegaly. There is no tenderness. There is no rebound, no guarding and no CVA tenderness. No hernia.  Genitourinary: No breast swelling, tenderness, discharge or bleeding. Pelvic exam was performed with patient prone.  Lymphadenopathy:    She has no cervical adenopathy.    She has no axillary adenopathy.  Neurological: She is alert. She has normal strength. No cranial nerve deficit or  sensory deficit.  Skin: Skin is warm, dry and intact. No rash noted.  Psychiatric: Her speech is normal and behavior is normal. Judgment normal. Her mood appears not anxious. Cognition and memory are normal. She does not  exhibit a depressed mood.          Assessment & Plan:  Annual medicare Wellness: The patient's preventative maintenance and recommended screening tests for an annual wellness exam were reviewed in full today. Brought up to date unless services declined.  Counselled on the importance of diet, exercise, and its role in overall health and mortality. The patient's FH and SH was reviewed, including their home life, tobacco status, and drug and alcohol status.   UTD with Td and PNa, discussed shingles vaccine.  Last DXA: 2011 Last colonoscopy: 2010, no repeat needed. Mammogram due.

## 2010-10-29 NOTE — Assessment & Plan Note (Signed)
Well controlled. Continue current medication.  

## 2010-10-29 NOTE — Assessment & Plan Note (Signed)
Re-eval in 6 months. If still elevated consider further work up.  No ETOH, limited tylenol.

## 2010-10-29 NOTE — Assessment & Plan Note (Signed)
Encouraged exercise, weight loss, healthy eating habits. ? ?

## 2010-10-29 NOTE — Patient Instructions (Addendum)
For cardiac prevention: Start baby asprin daily 81 mg, ENTERIC Coated. Stop if stomach irritation. Consider starting fish/flax oil.. 2000 mg divided daily. DHA, EPA, ALA. Decrease sugar and sweets in diet, decrease carbohydrate intake.  Lab visit in 6 months for re-eval LFTs. Call if interested in cardiology referral.

## 2010-10-29 NOTE — Assessment & Plan Note (Signed)
Well controlled on diet.  Discussed ways to increase HDL to lower CAD risk.

## 2010-10-29 NOTE — Assessment & Plan Note (Signed)
Stable

## 2010-11-11 ENCOUNTER — Ambulatory Visit (HOSPITAL_COMMUNITY)
Admission: RE | Admit: 2010-11-11 | Discharge: 2010-11-11 | Disposition: A | Discharge status: Home / Self Care | Payer: Medicare Other | Source: Ambulatory Visit | Attending: Family Medicine | Admitting: Family Medicine

## 2010-11-11 DIAGNOSIS — Z1231 Encounter for screening mammogram for malignant neoplasm of breast: Secondary | ICD-10-CM | POA: Insufficient documentation

## 2011-01-30 ENCOUNTER — Other Ambulatory Visit: Payer: Self-pay | Admitting: Family Medicine

## 2011-04-19 ENCOUNTER — Ambulatory Visit: Payer: Medicare Other | Admitting: Family Medicine

## 2011-04-22 ENCOUNTER — Ambulatory Visit: Payer: Medicare Other | Admitting: Family Medicine

## 2011-05-01 ENCOUNTER — Encounter (HOSPITAL_COMMUNITY): Payer: Self-pay | Admitting: *Deleted

## 2011-05-01 ENCOUNTER — Emergency Department (HOSPITAL_COMMUNITY): Payer: Medicare Other

## 2011-05-01 ENCOUNTER — Other Ambulatory Visit: Payer: Self-pay

## 2011-05-01 ENCOUNTER — Emergency Department (HOSPITAL_COMMUNITY)
Admission: EM | Admit: 2011-05-01 | Discharge: 2011-05-01 | Disposition: A | Discharge status: Home / Self Care | Payer: Medicare Other | Attending: Emergency Medicine | Admitting: Emergency Medicine

## 2011-05-01 DIAGNOSIS — R109 Unspecified abdominal pain: Secondary | ICD-10-CM | POA: Insufficient documentation

## 2011-05-01 DIAGNOSIS — R110 Nausea: Secondary | ICD-10-CM | POA: Insufficient documentation

## 2011-05-01 DIAGNOSIS — K838 Other specified diseases of biliary tract: Secondary | ICD-10-CM | POA: Insufficient documentation

## 2011-05-01 DIAGNOSIS — R161 Splenomegaly, not elsewhere classified: Secondary | ICD-10-CM | POA: Insufficient documentation

## 2011-05-01 DIAGNOSIS — Z8711 Personal history of peptic ulcer disease: Secondary | ICD-10-CM | POA: Insufficient documentation

## 2011-05-01 DIAGNOSIS — R10819 Abdominal tenderness, unspecified site: Secondary | ICD-10-CM | POA: Insufficient documentation

## 2011-05-01 LAB — COMPREHENSIVE METABOLIC PANEL WITH GFR
ALT: 88 U/L — ABNORMAL HIGH (ref 0–35)
Alkaline Phosphatase: 197 U/L — ABNORMAL HIGH (ref 39–117)
CO2: 24 meq/L (ref 19–32)
Chloride: 102 meq/L (ref 96–112)
GFR calc Af Amer: >90 mL/min (ref >90)
Glucose, Bld: 87 mg/dL (ref 70–99)
Potassium: 4 meq/L (ref 3.5–5.1)
Sodium: 137 meq/L (ref 135–145)
Total Bilirubin: 0.7 mg/dL (ref 0.3–1.2)
Total Protein: 6.4 g/dL (ref 6.0–8.3)

## 2011-05-01 LAB — URINALYSIS, ROUTINE W REFLEX MICROSCOPIC
Bilirubin Urine: NEGATIVE
Glucose, UA: NEGATIVE mg/dL
Hgb urine dipstick: NEGATIVE
Ketones, ur: NEGATIVE mg/dL
Leukocytes, UA: NEGATIVE
Nitrite: NEGATIVE
Protein, ur: NEGATIVE mg/dL
Specific Gravity, Urine: 1.013 (ref 1.005–1.030)
Urobilinogen, UA: 0.2 mg/dL (ref 0.0–1.0)
pH: 7 (ref 5.0–8.0)

## 2011-05-01 LAB — CBC
HCT: 36.5 % (ref 36.0–46.0)
Hemoglobin: 12.8 g/dL (ref 12.0–15.0)
MCH: 30.9 pg (ref 26.0–34.0)
MCHC: 35.1 g/dL (ref 30.0–36.0)
MCV: 88.2 fL (ref 78.0–100.0)
Platelets: 188 K/uL (ref 150–400)
RBC: 4.14 MIL/uL (ref 3.87–5.11)
RDW: 13.4 % (ref 11.5–15.5)
WBC: 7 K/uL (ref 4.0–10.5)

## 2011-05-01 LAB — DIFFERENTIAL
Basophils Absolute: 0 10*3/uL (ref 0.0–0.1)
Basophils Relative: 0 % (ref 0–1)
Eosinophils Absolute: 0.1 K/uL (ref 0.0–0.7)
Eosinophils Relative: 1 % (ref 0–5)
Lymphocytes Relative: 10 % — ABNORMAL LOW (ref 12–46)
Lymphs Abs: 0.7 K/uL (ref 0.7–4.0)
Monocytes Absolute: 0.8 10*3/uL (ref 0.1–1.0)
Monocytes Relative: 12 % (ref 3–12)
Neutro Abs: 5.5 K/uL (ref 1.7–7.7)
Neutrophils Relative %: 78 % — ABNORMAL HIGH (ref 43–77)

## 2011-05-01 LAB — COMPREHENSIVE METABOLIC PANEL
AST: 287 U/L — ABNORMAL HIGH (ref 0–37)
Albumin: 3.6 g/dL (ref 3.5–5.2)
BUN: 8 mg/dL (ref 6–23)
Calcium: 9.4 mg/dL (ref 8.4–10.5)
Creatinine, Ser: 0.49 mg/dL — ABNORMAL LOW (ref 0.50–1.10)
GFR calc non Af Amer: 90 mL/min — ABNORMAL LOW (ref >90)

## 2011-05-01 LAB — LIPASE, BLOOD: Lipase: 40 U/L (ref 11–59)

## 2011-05-01 LAB — TROPONIN I: Troponin I: <0.3 ng/mL (ref <0.30)

## 2011-05-01 MED ORDER — SODIUM CHLORIDE 0.9 % IV SOLN
Freq: Once | INTRAVENOUS | Status: AC
  Administered 2011-05-01: 13:00:00 via INTRAVENOUS

## 2011-05-01 MED ORDER — PANTOPRAZOLE SODIUM 40 MG IV SOLR
40.0000 mg | Freq: Once | INTRAVENOUS | Status: AC
  Administered 2011-05-01: 40 mg via INTRAVENOUS
  Filled 2011-05-01: qty 40

## 2011-05-01 MED ORDER — FENTANYL CITRATE 0.05 MG/ML IJ SOLN
INTRAMUSCULAR | Status: AC
  Administered 2011-05-01: 16:00:00
  Filled 2011-05-01: qty 2

## 2011-05-01 NOTE — ED Notes (Signed)
[id number]<AV> Expected date:<BR> Expected time:<BR> Means of arrival:<BR> Comments:<BR> EMS abd pain

## 2011-05-01 NOTE — ED Provider Notes (Signed)
History     CSN: [id number]  Arrival date & time 05/01/11  1212   First MD Initiated Contact with Patient 05/01/11 1219      Chief Complaint  Patient presents with  . Abdominal Pain    (Consider location/radiation/quality/duration/timing/severity/associated sxs/prior treatment) HPI Comments: Patient reports that she had woken up feeling well, and then developed some upper epigastric and right upper quadrant abdominal pain that was quite severe. She reports that his has eased off on its own but is still slightly present. She did have an episode of nausea but no vomiting. She does endorse some reflux-type discomfort especially when she was lying on the stretcher on the ambulance on the way here. She does take a proton pump inhibitor as needed although she is supposed to take it daily. She has not taken it today. Otherwise she reports that she has been feeling her usual self. However she notes about 2 days ago she does use a bladder pessary and reports that it may have shifted a couple days ago. She denies any dysuria, urinary frequency, hematuria. Denies prior history of kidney stone. Past surgical history includes appendectomy. Otherwise she takes medication for the reflux and for osteoporosis. She denies any obvious fevers or chills. She denies radiation of pain into her chest but denies any frank chest pain, shortness of breath, pleurisy, sweating. She denies any exertional symptoms recently.  Patient is a 78 y.o. female presenting with abdominal pain. The history is provided by the patient and a relative.  Abdominal Pain The primary symptoms of the illness include abdominal pain.    Past Medical History  Diagnosis Date  . Anemia   . Osteoarthritis     hands, knees, and low back  . GERD (gastroesophageal reflux disease)   . Ulcer     peptic  . Osteoporosis     Past Surgical History  Procedure Date  . External ear surgery 1980    left side  . Spine surgery 1990    lumbar,  ruptured disc  . Knee surgery 2004    arthroscopic  . Appendectomy     Family History  Problem Relation Age of Onset  . Heart disease Mother   . Heart disease Father 5  . Hyperlipidemia Father   . Diabetes Father   . Cirrhosis Sister   . Diabetes Brother   . Heart disease Brother     History  Substance Use Topics  . Smoking status: Never Smoker   . Smokeless tobacco: Not on file  . Alcohol Use: No    OB History    Grav Para Term Preterm Abortions TAB SAB Ect Mult Living                  Review of Systems  Gastrointestinal: Positive for abdominal pain.  All other systems reviewed and are negative.    Allergies  Review of patient's allergies indicates no known allergies.  Home Medications   Current Outpatient Rx  Name Route Sig Dispense Refill  . ACETAMINOPHEN 325 MG PO TABS Oral Take 650 mg by mouth every 6 (six) hours as needed. For tooth pain    . ALENDRONATE SODIUM 70 MG PO TABS      . CALCIUM CARBONATE 600 MG PO TABS Oral Take 600 mg by mouth 2 (two) times daily with a meal.      . VITAMIN D3 400 UNITS PO CAPS Oral Take 400 mg by mouth 2 (two) times daily.      Marland Kitchen  FERROUS SULFATE 325 (65 FE) MG PO TABS Oral Take 325 mg by mouth 3 (three) times daily.      Marland Kitchen OMEPRAZOLE 20 MG PO CPDR  TAKE 1 CAPSULE TWICE DAILY 90 capsule 2    BP 161/84  Pulse 98  Temp(Src) 97.8 F (36.6 C) (Oral)  Resp 20  SpO2 98%  Physical Exam  Nursing note and vitals reviewed. Constitutional: She appears well-developed and well-nourished.  HENT:  Head: Normocephalic and atraumatic.  Eyes: Pupils are equal, round, and reactive to light. No scleral icterus.  Neck: Normal range of motion.  Cardiovascular: Normal rate.   Pulmonary/Chest: Effort normal and breath sounds normal. No respiratory distress.  Abdominal: Soft. She exhibits no distension. There is tenderness in the right upper quadrant and epigastric area. There is no rebound, no guarding, no CVA tenderness and negative  Murphy's sign.       Minimal tenderness in the upper epigastric and right upper quadrant region without any guarding or rebound.  Musculoskeletal: She exhibits no edema and no tenderness.  Skin: Skin is warm and dry. No erythema.  Psychiatric: She has a normal mood and affect.    ED Course  Procedures (including critical care time)   Labs Reviewed  URINALYSIS, ROUTINE W REFLEX MICROSCOPIC  CBC  DIFFERENTIAL  COMPREHENSIVE METABOLIC PANEL  LIPASE, BLOOD  TROPONIN I   No results found.   No diagnosis found.  RA sat is 98% and normal  ECG at time 12:48, shows a normal sinus rhythm at a rate of 93. Normal axis and intervals. No ST or T-wave abnormalities. No significant change compared to EKG from 06/14/2002  MDM  IVF's, IV protonix.  ECG is ok, will get troponin, LFT's, lipase, UA and consider U/S versus CT Scan based on lab results.        UA showed no hematuria, however LFT's were elevate.  Normal WBC.  No fever.  Pt given a small amount of fentanyl for mild general HA.  Abd u/S ordered given suspicion initially for possible biliary colic symptoms associated with elevated LFT's which appear to be new compared to prior blood tests.  Pt remains relatively pain controlled otherwise and pt is signed out to Dr. Nino Parsley for recheck and disposition.    Gavin Pound. Dorathea Faerber, MD 05/02/11 1106

## 2011-05-01 NOTE — ED Notes (Signed)
Pt ambulated to restroom with 2 person assist 

## 2011-05-01 NOTE — ED Notes (Signed)
Pt sts heart burn is returning.

## 2011-05-01 NOTE — ED Notes (Signed)
Per EMS: Pt from home experiencing upper GI pain. No vomiting or fever. Pt with hx of GERD, has not taken medication for it today. LBM today.

## 2011-05-01 NOTE — ED Provider Notes (Signed)
History     CSN: [id number]  Arrival date & time 05/01/11  1212   First MD Initiated Contact with Patient 05/01/11 1219      Chief Complaint  Patient presents with  . Abdominal Pain    (Consider location/radiation/quality/duration/timing/severity/associated sxs/prior treatment) HPI  Past Medical History  Diagnosis Date  . Anemia   . Osteoarthritis     hands, knees, and low back  . GERD (gastroesophageal reflux disease)   . Ulcer     peptic  . Osteoporosis     Past Surgical History  Procedure Date  . External ear surgery 1980    left side  . Spine surgery 1990    lumbar, ruptured disc  . Knee surgery 2004    arthroscopic  . Appendectomy     Family History  Problem Relation Age of Onset  . Heart disease Mother   . Heart disease Father 8  . Hyperlipidemia Father   . Diabetes Father   . Cirrhosis Sister   . Diabetes Brother   . Heart disease Brother     History  Substance Use Topics  . Smoking status: Never Smoker   . Smokeless tobacco: Not on file  . Alcohol Use: No    OB History    Grav Para Term Preterm Abortions TAB SAB Ect Mult Living                  Review of Systems  Allergies  Review of patient's allergies indicates no known allergies.  Home Medications   Current Outpatient Rx  Name Route Sig Dispense Refill  . ACETAMINOPHEN 325 MG PO TABS Oral Take 650 mg by mouth every 6 (six) hours as needed. For tooth pain    . ALENDRONATE SODIUM 70 MG PO TABS      . CALCIUM CARBONATE 600 MG PO TABS Oral Take 600 mg by mouth 2 (two) times daily with a meal.      . VITAMIN D3 400 UNITS PO CAPS Oral Take 400 mg by mouth 2 (two) times daily.      Marland Kitchen FERROUS SULFATE 325 (65 FE) MG PO TABS Oral Take 325 mg by mouth 3 (three) times daily.      Marland Kitchen OMEPRAZOLE 20 MG PO CPDR  TAKE 1 CAPSULE TWICE DAILY 90 capsule 2    BP 147/78  Pulse 90  Temp(Src) 98.1 F (36.7 C) (Oral)  Resp 20  SpO2 98%  Physical Exam  ED Course  Procedures (including  critical care time)  Labs Reviewed  DIFFERENTIAL - Abnormal; Notable for the following:    Neutrophils Relative 78 (*)    Lymphocytes Relative 10 (*)    All other components within normal limits  COMPREHENSIVE METABOLIC PANEL - Abnormal; Notable for the following:    Creatinine, Ser 0.49 (*)    AST 287 (*)    ALT 88 (*)    Alkaline Phosphatase 197 (*)    GFR calc non Af Amer 90 (*)    All other components within normal limits  URINALYSIS, ROUTINE W REFLEX MICROSCOPIC  CBC  LIPASE, BLOOD  TROPONIN I   US Abdomen Complete  05/01/2011  *RADIOLOGY REPORT*  Clinical Data:  Abdominal pain  ABDOMINAL ULTRASOUND COMPLETE  Comparison:  None.  Findings:  Gallbladder:  There is prominent amount of sludge within the gallbladder lumen.  No discrete gallstone is seen.  Gallbladder wall thickness is within normal limits, at 2.7 mm.  The sonographic Murphy's sign is negative.  Common Bile Duct:  Measures 8 mm in diameter.  Liver: Small 8 mm cyst noted in the right lobe the liver. Otherwise, within normal limits for echogenicity. Within normal limits in parenchymal echogenicity.The portal vein is patent and demonstrates a patent pedal flow.  IVC:  Appears normal.  Pancreas:  Although the pancreas is difficult to visualize in its entirety, no focal pancreatic abnormality is identified.  Spleen:  In this plane is enlarged.  The splenic volume is at least 643 cm cubed.  The splenic length is estimated to be 12 cm, although it is difficult to include the spleen entirely on the ultrasound image.  No focal splenic mass.  Right kidney:  Normal in size and parenchymal echogenicity.  No evidence of mass.  Slight fullness of the renal pelvis, without discrete hydronephrosis.  Left kidney:  Normal in size and parenchymal echogenicity.  No evidence of mass.  Slight fullness of the renal pelvis, without discrete hydronephrosis.  Abdominal Aorta:  No aneurysm identified.  IMPRESSION:  1.  Splenomegaly. 2.  Prominent amount of  gallbladder sludge. 3.  Common bile duct is upper normal for patient age, to mildly prominent.  Measures 8 mm.  Original Report Authenticated By: Britta Mccreedy, M.D.     No diagnosis found.  I spoke with Dr. Daphine Deutscher.  He agrees to see.  The patient in the office.   MDM  Abdominal pain, with biliary sludge.  No acute abdomen.  No fever, or leukocytosis.  No signs of acute cholecystitis.        Nicholes Stairs, MD 05/01/11 1736

## 2011-05-01 NOTE — Discharge Instructions (Signed)
Your ultrasound shows her to have sludge in her gallbladder, but she did not have gallstones.  You do not have signs of infection.  Followup with the general surgeon, for reevaluation to discuss the need for surgery.  Call for appointment time.  Next week.  Return for worse or uncontrolled symptoms.

## 2011-05-28 ENCOUNTER — Ambulatory Visit (INDEPENDENT_AMBULATORY_CARE_PROVIDER_SITE_OTHER): Payer: Medicare Other | Admitting: Surgery

## 2011-05-28 ENCOUNTER — Encounter (INDEPENDENT_AMBULATORY_CARE_PROVIDER_SITE_OTHER): Payer: Self-pay | Admitting: Surgery

## 2011-05-28 VITALS — BP 118/78 | HR 80 | Temp 98.2°F | Resp 18 | Ht 59.0 in | Wt 108.6 lb

## 2011-05-28 DIAGNOSIS — K8020 Calculus of gallbladder without cholecystitis without obstruction: Secondary | ICD-10-CM

## 2011-05-28 NOTE — Patient Instructions (Signed)
Return if recurrent pain

## 2011-05-28 NOTE — Progress Notes (Signed)
Chief Complaint:  Mid epigastric pain one month ago  History of Present Illness:  Brianna Barnes is an 78 y.o. female who had an attack of Bandlike midepigastric pain that lasted for several hours. She went to the emergency department where an ultrasound showed sludge but no evidence of acute cholecystitis. Her bilirubin was normal but her alkaline phosphatase and transaminases were in the low 100. She denies any jaundice or dark urine. Since that attack she has done fine. She's not had any further pain and there was a first occurrence.  I gave her gallbladder book and went over the procedure in some detail including the risk of common bile duct injury bleeding bile leaks. She's aware of the risk. I think since she is asymptomatic at 78 years old she may will think about having this done electively or wait and see if he becomes symptomatic again. She chose the latter will contact should she began having symptoms.  Past Medical History  Diagnosis Date  . Anemia   . Osteoarthritis     hands, knees, and low back  . GERD (gastroesophageal reflux disease)   . Ulcer     peptic  . Osteoporosis     Past Surgical History  Procedure Date  . External ear surgery 1980    left side  . Spine surgery 1990    lumbar, ruptured disc  . Knee surgery 2004    arthroscopic  . Appendectomy     Current Outpatient Prescriptions  Medication Sig Dispense Refill  . acetaminophen (TYLENOL) 325 MG tablet Take 650 mg by mouth every 6 (six) hours as needed. For tooth pain      . alendronate (FOSAMAX) 70 MG tablet       . calcium carbonate (OS-CAL) 600 MG TABS Take 600 mg by mouth 2 (two) times daily with a meal.        . Cholecalciferol (VITAMIN D3) 400 UNITS CAPS Take 400 mg by mouth 2 (two) times daily.        . ferrous sulfate 325 (65 FE) MG tablet Take 325 mg by mouth 3 (three) times daily.        Marland Kitchen omeprazole (PRILOSEC) 20 MG capsule TAKE 1 CAPSULE TWICE DAILY  90 capsule  2   Review of patient's allergies  indicates no known allergies. Family History  Problem Relation Age of Onset  . Heart disease Mother   . Heart disease Father 24  . Hyperlipidemia Father   . Diabetes Father   . Cirrhosis Sister   . Diabetes Brother   . Heart disease Brother    Social History:   reports that she has never smoked. She does not have any smokeless tobacco history on file. She reports that she does not drink alcohol or use illicit drugs.   REVIEW OF SYSTEMS - PERTINENT POSITIVES ONLY: Negative for prior attacks  Physical Exam:   Blood pressure 118/78, pulse 80, temperature 98.2 F (36.8 C), temperature source Temporal, resp. rate 18, height 4\' 11"  (1.499 m), weight 108 lb 9.6 oz (49.261 kg). Body mass index is 21.93 kg/(m^2).  Gen:  WDWN white female  NAD  Neurological: Alert and oriented to person, place, and time. Motor and sensory function is grossly intact  Head: Normocephalic and atraumatic.  Eyes: Conjunctivae are normal. Pupils are equal, round, and reactive to light. No scleral icterus.  Neck: Normal range of motion. Neck supple. No tracheal deviation or thyromegaly present.  Cardiovascular:  SR without murmurs or gallops.  No  carotid bruits Respiratory: Effort normal.  No respiratory distress. No chest wall tenderness. Breath sounds normal.  No wheezes, rales or rhonchi.  Abdomen:  nontender GU: Musculoskeletal: Normal range of motion. Extremities are nontender. No cyanosis, edema or clubbing noted Lymphadenopathy: No cervical, preauricular, postauricular or axillary adenopathy is present Skin: Skin is warm and dry. No rash noted. No diaphoresis. No erythema. No pallor. Pscyh: Normal mood and affect. Behavior is normal. Judgment and thought content normal.   LABORATORY RESULTS: No results found for this or any previous visit (from the past 48 hour(s)).  RADIOLOGY RESULTS: No results found.  Problem List: Patient Active Problem List  Diagnoses  . UNSPECIFIED VITAMIN D DEFICIENCY  .  HYPERLIPIDEMIA  . ANEMIA-IRON DEFICIENCY  . ESSENTIAL HYPERTENSION, BENIGN  . GERD  . PEPTIC ULCER DISEASE  . UTEROVAGINAL PROLAPSE, INCOMPLETE  . OSTEOARTHRITIS  . OSTEOPOROSIS  . ARTERIOVENOUS MALFORMATION  . POSITIVE TB SKIN TEST, WITHOUT TUBERCULOSIS  . COLONIC POLYPS, BENIGN, HX OF  . Prediabetes  . Family history of early CAD  . Elevated LFTs    Assessment & Plan: Younger than stated age white female with gallbladder sludge and elevated liver function studies. Has been asymptomatic for one month. I think is reasonable to watch her and I will see her again in as needed.    Matt B. Daphine Deutscher, MD, [facility] [phone] beeper [phone]  05/28/2011 10:32 AM

## 2011-07-16 ENCOUNTER — Ambulatory Visit (INDEPENDENT_AMBULATORY_CARE_PROVIDER_SITE_OTHER): Payer: Medicare Other | Admitting: Family Medicine

## 2011-07-16 ENCOUNTER — Encounter: Payer: Self-pay | Admitting: Family Medicine

## 2011-07-16 VITALS — BP 110/70 | HR 78 | Temp 97.9°F | Ht 59.0 in | Wt 110.8 lb

## 2011-07-16 DIAGNOSIS — R7309 Other abnormal glucose: Secondary | ICD-10-CM

## 2011-07-16 DIAGNOSIS — R7303 Prediabetes: Secondary | ICD-10-CM

## 2011-07-16 DIAGNOSIS — E785 Hyperlipidemia, unspecified: Secondary | ICD-10-CM

## 2011-07-16 DIAGNOSIS — Z23 Encounter for immunization: Secondary | ICD-10-CM

## 2011-07-16 DIAGNOSIS — I10 Essential (primary) hypertension: Secondary | ICD-10-CM

## 2011-07-16 DIAGNOSIS — R7989 Other specified abnormal findings of blood chemistry: Secondary | ICD-10-CM

## 2011-07-16 DIAGNOSIS — H6121 Impacted cerumen, right ear: Secondary | ICD-10-CM | POA: Insufficient documentation

## 2011-07-16 LAB — HEPATIC FUNCTION PANEL
AST: 32 U/L (ref 0–37)
Albumin: 4 g/dL (ref 3.5–5.2)
Alkaline Phosphatase: 74 U/L (ref 39–117)
Total Bilirubin: 0.7 mg/dL (ref 0.3–1.2)

## 2011-07-16 MED ORDER — ZOSTER VACCINE LIVE 19400 UNT/0.65ML ~~LOC~~ SOLR: 0.6500 mL | Freq: Once | SUBCUTANEOUS | Status: AC

## 2011-07-16 NOTE — Assessment & Plan Note (Addendum)
Well controlled. Encouraged exercise, weight maintanance, healthy eating habits.

## 2011-07-16 NOTE — Patient Instructions (Signed)
Stop at lab on way out. Follow up appt in 6 months for medicare Wellness, subsequent.

## 2011-07-16 NOTE — Progress Notes (Signed)
Subjective:    Patient ID: Brianna Barnes, female    DOB: 1929/03/02, 78 y.o.   MRN: [id number]  HPI 78 year old female presents for 6 month follow up.  Hypertension:  Well controlled on no med.  Using medication without problems or lightheadedness:  No  Chest pain with exertion:No Edema:No Short of breath:No  Average home BPs:Not checking. Other issues:  Elevated Cholesterol:At goal at last check on no med Diet compliance: Good, no fatty foods Exercise: Walking 2 days a week. Other complaints:   Elevated LFTs:  One slightly elevated at last OV 6 months ago  Since then in 2/13 had an attack of Bandlike midepigastric pain that lasted for several hours. She went to the emergency department where an ultrasound showed sludge but no evidence of acute cholecystitis. Her bilirubin was normal but her alkaline phosphatase and transaminases were in the low 100. She denies any jaundice or dark urine. Since that attack she has done fine. She's not had any further pain and there was a first occurrence.  She has seen Gen surgeon Dr. Daphine Deutscher and has chosen to hold treatment until any further worsening.   She has gotten a new hearing aid... She has had wax in both ears.. Wishes to have this cleaned out.    Review of Systems  Constitutional: Negative for fever and fatigue.  HENT: Negative for ear pain and congestion.        Only mild pain when hearing aid in.. Think it may be from wax pressing.  Eyes: Negative for pain.  Respiratory: Negative for cough and shortness of breath.   Cardiovascular: Negative for chest pain, palpitations and leg swelling.  Gastrointestinal: Negative for constipation, blood in stool and abdominal distention.       Objective:   Physical Exam  Constitutional: Vital signs are normal. She appears well-developed and well-nourished. She is cooperative.  Non-toxic appearance. She does not appear ill. No distress.  HENT:  Head: Normocephalic.  Right Ear: Hearing,  tympanic membrane, external ear and ear canal normal. Tympanic membrane is not erythematous, not retracted and not bulging.  Left Ear: Hearing, tympanic membrane, external ear and ear canal normal. Tympanic membrane is not erythematous, not retracted and not bulging.  Nose: No mucosal edema or rhinorrhea. Right sinus exhibits no maxillary sinus tenderness and no frontal sinus tenderness. Left sinus exhibits no maxillary sinus tenderness and no frontal sinus tenderness.  Mouth/Throat: Uvula is midline, oropharynx is clear and moist and mucous membranes are normal.       Cerumen B TMs , right TM blocked/impacted  Eyes: Conjunctivae, EOM and lids are normal. Pupils are equal, round, and reactive to light. No foreign bodies found.  Neck: Trachea normal and normal range of motion. Neck supple. Carotid bruit is not present. No mass and no thyromegaly present.  Cardiovascular: Normal rate, regular rhythm, S1 normal, S2 normal, normal heart sounds, intact distal pulses and normal pulses.  Exam reveals no gallop and no friction rub.   No murmur heard. Pulmonary/Chest: Effort normal and breath sounds normal. Not tachypneic. No respiratory distress. She has no decreased breath sounds. She has no wheezes. She has no rhonchi. She has no rales.  Abdominal: Soft. Normal appearance and bowel sounds are normal. There is no tenderness.  Neurological: She is alert.  Skin: Skin is warm, dry and intact. No rash noted.  Psychiatric: Her speech is normal and behavior is normal. Judgment and thought content normal. Her mood appears not anxious. Cognition and memory are  normal. She does not exhibit a depressed mood.          Assessment & Plan:

## 2011-07-16 NOTE — Progress Notes (Signed)
Addended by: Consuello Masse on: 07/16/2011 09:34 AM   Modules accepted: Orders

## 2011-07-16 NOTE — Assessment & Plan Note (Signed)
Likely due to gallbladder disease. Recheck today.

## 2011-07-16 NOTE — Assessment & Plan Note (Signed)
Irrigated with water, TMs clear following.

## 2011-07-16 NOTE — Assessment & Plan Note (Signed)
At goal at last check... Recheck in 6 mnths.

## 2011-08-24 ENCOUNTER — Encounter (HOSPITAL_COMMUNITY): Payer: Self-pay | Admitting: *Deleted

## 2011-08-24 ENCOUNTER — Emergency Department (HOSPITAL_COMMUNITY): Payer: Medicare Other

## 2011-08-24 ENCOUNTER — Inpatient Hospital Stay (HOSPITAL_COMMUNITY)
Admission: EM | Admit: 2011-08-24 | Discharge: 2011-08-27 | DRG: 419 | Disposition: A | Discharge status: Home / Self Care | Payer: Medicare Other | Attending: General Surgery | Admitting: General Surgery

## 2011-08-24 DIAGNOSIS — IMO0002 Reserved for concepts with insufficient information to code with codable children: Secondary | ICD-10-CM | POA: Diagnosis present

## 2011-08-24 DIAGNOSIS — M19049 Primary osteoarthritis, unspecified hand: Secondary | ICD-10-CM | POA: Diagnosis present

## 2011-08-24 DIAGNOSIS — K8020 Calculus of gallbladder without cholecystitis without obstruction: Principal | ICD-10-CM | POA: Diagnosis present

## 2011-08-24 DIAGNOSIS — M1710 Unilateral primary osteoarthritis, unspecified knee: Secondary | ICD-10-CM | POA: Diagnosis present

## 2011-08-24 DIAGNOSIS — K8050 Calculus of bile duct without cholangitis or cholecystitis without obstruction: Secondary | ICD-10-CM

## 2011-08-24 DIAGNOSIS — I10 Essential (primary) hypertension: Secondary | ICD-10-CM | POA: Diagnosis present

## 2011-08-24 DIAGNOSIS — K219 Gastro-esophageal reflux disease without esophagitis: Secondary | ICD-10-CM | POA: Diagnosis present

## 2011-08-24 DIAGNOSIS — M1990 Unspecified osteoarthritis, unspecified site: Secondary | ICD-10-CM | POA: Diagnosis present

## 2011-08-24 LAB — COMPREHENSIVE METABOLIC PANEL
Alkaline Phosphatase: 64 U/L (ref 39–117)
BUN: 12 mg/dL (ref 6–23)
CO2: 24 mEq/L (ref 19–32)
Chloride: 108 mEq/L (ref 96–112)
Creatinine, Ser: 0.59 mg/dL (ref 0.50–1.10)
GFR calc Af Amer: >90 mL/min (ref >90)
GFR calc non Af Amer: 84 mL/min — ABNORMAL LOW (ref >90)
Glucose, Bld: 123 mg/dL — ABNORMAL HIGH (ref 70–99)
Potassium: 4 mEq/L (ref 3.5–5.1)
Total Bilirubin: 0.4 mg/dL (ref 0.3–1.2)

## 2011-08-24 LAB — POCT I-STAT TROPONIN I: Troponin i, poc: 0 ng/mL (ref 0.00–0.08)

## 2011-08-24 LAB — DIFFERENTIAL
Basophils Absolute: 0 10*3/uL (ref 0.0–0.1)
Eosinophils Relative: 1 % (ref 0–5)
Lymphocytes Relative: 11 % — ABNORMAL LOW (ref 12–46)
Monocytes Absolute: 0.6 10*3/uL (ref 0.1–1.0)
Monocytes Relative: 9 % (ref 3–12)
Neutro Abs: 4.6 10*3/uL (ref 1.7–7.7)

## 2011-08-24 LAB — CBC
HCT: 32.9 % — ABNORMAL LOW (ref 36.0–46.0)
Hemoglobin: 11.2 g/dL — ABNORMAL LOW (ref 12.0–15.0)
MCV: 88.2 fL (ref 78.0–100.0)
RDW: 13.4 % (ref 11.5–15.5)
WBC: 5.9 10*3/uL (ref 4.0–10.5)

## 2011-08-24 MED ORDER — ONDANSETRON HCL 4 MG/2ML IJ SOLN
INTRAMUSCULAR | Status: AC
  Filled 2011-08-24: qty 2

## 2011-08-24 MED ORDER — ONDANSETRON HCL 4 MG/2ML IJ SOLN
4.0000 mg | Freq: Once | INTRAMUSCULAR | Status: AC
  Administered 2011-08-25: 4 mg via INTRAVENOUS

## 2011-08-24 MED ORDER — FENTANYL CITRATE 0.05 MG/ML IJ SOLN
50.0000 ug | Freq: Once | INTRAMUSCULAR | Status: AC
  Administered 2011-08-25: 50 ug via INTRAVENOUS
  Filled 2011-08-24: qty 2

## 2011-08-24 NOTE — ED Notes (Signed)
MD (Dr. Radford Pax) at bedside.

## 2011-08-24 NOTE — ED Notes (Signed)
Substernal chest pain that radiates to her back 10/10. Nausea and vomiting on scene by EMS.  5 nitro, 4 zofran, 325 asa given enroute. IV established. 300cc NS given.

## 2011-08-24 NOTE — ED Notes (Signed)
Complaining of pain under right breast. States similar to pain that brought her to hospital. EDP notified. Will continue to monitor patient

## 2011-08-25 ENCOUNTER — Encounter (HOSPITAL_COMMUNITY): Admission: EM | Disposition: A | Discharge status: Home / Self Care | Payer: Self-pay | Source: Home / Self Care

## 2011-08-25 ENCOUNTER — Encounter (HOSPITAL_COMMUNITY): Payer: Self-pay | Admitting: Certified Registered Nurse Anesthetist

## 2011-08-25 ENCOUNTER — Inpatient Hospital Stay (HOSPITAL_COMMUNITY): Payer: Medicare Other | Admitting: Certified Registered Nurse Anesthetist

## 2011-08-25 ENCOUNTER — Encounter (HOSPITAL_COMMUNITY): Payer: Self-pay | Admitting: Surgery

## 2011-08-25 ENCOUNTER — Inpatient Hospital Stay (HOSPITAL_COMMUNITY): Payer: Medicare Other

## 2011-08-25 ENCOUNTER — Emergency Department (HOSPITAL_COMMUNITY): Payer: Medicare Other

## 2011-08-25 DIAGNOSIS — K8010 Calculus of gallbladder with chronic cholecystitis without obstruction: Secondary | ICD-10-CM

## 2011-08-25 HISTORY — PX: CHOLECYSTECTOMY: SHX55

## 2011-08-25 LAB — SURGICAL PCR SCREEN: MRSA, PCR: NEGATIVE

## 2011-08-25 SURGERY — LAPAROSCOPIC CHOLECYSTECTOMY WITH INTRAOPERATIVE CHOLANGIOGRAM
Anesthesia: General

## 2011-08-25 MED ORDER — HYDROMORPHONE HCL PF 1 MG/ML IJ SOLN
INTRAMUSCULAR | Status: AC
  Administered 2011-08-25: 01:00:00
  Filled 2011-08-25: qty 1

## 2011-08-25 MED ORDER — HEMOSTATIC AGENTS (NO CHARGE) OPTIME
TOPICAL | Status: DC | PRN
  Administered 2011-08-25: 1 via TOPICAL

## 2011-08-25 MED ORDER — NEOSTIGMINE METHYLSULFATE 1 MG/ML IJ SOLN
INTRAMUSCULAR | Status: DC | PRN
  Administered 2011-08-25: 3 mg via INTRAVENOUS

## 2011-08-25 MED ORDER — LACTATED RINGERS IV SOLN
INTRAVENOUS | Status: DC | PRN
  Administered 2011-08-25 (×2): via INTRAVENOUS

## 2011-08-25 MED ORDER — HYDROMORPHONE HCL PF 1 MG/ML IJ SOLN
0.2500 mg | INTRAMUSCULAR | Status: DC | PRN
  Administered 2011-08-25 (×3): 0.5 mg via INTRAVENOUS

## 2011-08-25 MED ORDER — HYDROMORPHONE HCL PF 1 MG/ML IJ SOLN
1.0000 mg | INTRAMUSCULAR | Status: DC | PRN
  Administered 2011-08-25: 0.5 mg via INTRAVENOUS
  Administered 2011-08-26: 1 mg via INTRAVENOUS
  Filled 2011-08-25 (×2): qty 1

## 2011-08-25 MED ORDER — ONDANSETRON HCL 4 MG/2ML IJ SOLN: 4.0000 mg | Freq: Four times a day (QID) | INTRAMUSCULAR | Status: DC | PRN

## 2011-08-25 MED ORDER — FENTANYL CITRATE 0.05 MG/ML IJ SOLN
INTRAMUSCULAR | Status: DC | PRN
Start: 2011-08-25 — End: 2011-08-25
  Administered 2011-08-25 (×2): 50 ug via INTRAVENOUS
  Administered 2011-08-25: 25 ug via INTRAVENOUS
  Administered 2011-08-25: 50 ug via INTRAVENOUS
  Administered 2011-08-25: 25 ug via INTRAVENOUS

## 2011-08-25 MED ORDER — KCL IN DEXTROSE-NACL 20-5-0.9 MEQ/L-%-% IV SOLN
INTRAVENOUS | Status: DC
  Administered 2011-08-25 – 2011-08-26 (×2): via INTRAVENOUS
  Filled 2011-08-25 (×5): qty 1000

## 2011-08-25 MED ORDER — ONDANSETRON HCL 4 MG/2ML IJ SOLN: 4.0000 mg | Freq: Once | INTRAMUSCULAR | Status: DC | PRN

## 2011-08-25 MED ORDER — BUPIVACAINE-EPINEPHRINE 0.25% -1:200000 IJ SOLN
INTRAMUSCULAR | Status: DC | PRN
  Administered 2011-08-25: 18 mL

## 2011-08-25 MED ORDER — PROPOFOL 10 MG/ML IV EMUL
INTRAVENOUS | Status: DC | PRN
  Administered 2011-08-25: 100 mg via INTRAVENOUS

## 2011-08-25 MED ORDER — HYDROMORPHONE HCL PF 1 MG/ML IJ SOLN
INTRAMUSCULAR | Status: AC
  Filled 2011-08-25: qty 1

## 2011-08-25 MED ORDER — SODIUM CHLORIDE 0.9 % IV SOLN
INTRAVENOUS | Status: DC | PRN
  Administered 2011-08-25: 16:00:00

## 2011-08-25 MED ORDER — GLYCOPYRROLATE 0.2 MG/ML IJ SOLN
INTRAMUSCULAR | Status: DC | PRN
  Administered 2011-08-25: .4 mg via INTRAVENOUS

## 2011-08-25 MED ORDER — LIDOCAINE HCL (CARDIAC) 20 MG/ML IV SOLN
INTRAVENOUS | Status: DC | PRN
  Administered 2011-08-25: 40 mg via INTRAVENOUS

## 2011-08-25 MED ORDER — ROCURONIUM BROMIDE 100 MG/10ML IV SOLN
INTRAVENOUS | Status: DC | PRN
  Administered 2011-08-25: 30 mg via INTRAVENOUS
  Administered 2011-08-25: 10 mg via INTRAVENOUS

## 2011-08-25 MED ORDER — HYDROMORPHONE HCL PF 1 MG/ML IJ SOLN: 0.2500 mg | INTRAMUSCULAR | Status: DC | PRN

## 2011-08-25 MED ORDER — SODIUM CHLORIDE 0.9 % IR SOLN
Status: DC | PRN
  Administered 2011-08-25: 1000 mL

## 2011-08-25 MED ORDER — LACTATED RINGERS IV SOLN
INTRAVENOUS | Status: DC
  Administered 2011-08-25: 13:00:00 via INTRAVENOUS

## 2011-08-25 MED ORDER — ONDANSETRON HCL 4 MG/2ML IJ SOLN: 4.0000 mg | Freq: Once | INTRAMUSCULAR | Status: AC | PRN

## 2011-08-25 MED ORDER — ONDANSETRON HCL 4 MG/2ML IJ SOLN
INTRAMUSCULAR | Status: DC | PRN
  Administered 2011-08-25: 4 mg via INTRAVENOUS

## 2011-08-25 MED ORDER — ENOXAPARIN SODIUM 40 MG/0.4ML ~~LOC~~ SOLN
40.0000 mg | SUBCUTANEOUS | Status: DC
  Filled 2011-08-25: qty 0.4

## 2011-08-25 MED ORDER — SODIUM CHLORIDE 0.9 % IR SOLN
Status: DC | PRN
  Administered 2011-08-25 (×2): 1000 mL

## 2011-08-25 MED ORDER — CIPROFLOXACIN IN D5W 400 MG/200ML IV SOLN
400.0000 mg | Freq: Two times a day (BID) | INTRAVENOUS | Status: DC
  Filled 2011-08-25 (×3): qty 200

## 2011-08-25 SURGICAL SUPPLY — 54 items
ADH SKN CLS APL DERMABOND .7 (GAUZE/BANDAGES/DRESSINGS) ×1
APPLIER CLIP 5 13 M/L LIGAMAX5 (MISCELLANEOUS) ×2
APPLIER CLIP ROT 10 11.4 M/L (STAPLE)
APR CLP MED LRG 11.4X10 (STAPLE)
APR CLP MED LRG 5 ANG JAW (MISCELLANEOUS) ×1
BAG SPEC RTRVL LRG 6X4 10 (ENDOMECHANICALS) ×1
BLADE SURG ROTATE 9660 (MISCELLANEOUS) IMPLANT
CANISTER SUCTION 2500CC (MISCELLANEOUS) ×2 IMPLANT
CHLORAPREP W/TINT 26ML (MISCELLANEOUS) ×2 IMPLANT
CLIP APPLIE 5 13 M/L LIGAMAX5 (MISCELLANEOUS) ×1 IMPLANT
CLIP APPLIE ROT 10 11.4 M/L (STAPLE) IMPLANT
CLOTH BEACON ORANGE TIMEOUT ST (SAFETY) ×2 IMPLANT
COVER MAYO STAND STRL (DRAPES) ×2 IMPLANT
COVER SURGICAL LIGHT HANDLE (MISCELLANEOUS) ×2 IMPLANT
DECANTER SPIKE VIAL GLASS SM (MISCELLANEOUS) ×2 IMPLANT
DERMABOND ADVANCED (GAUZE/BANDAGES/DRESSINGS) ×1
DERMABOND ADVANCED .7 DNX12 (GAUZE/BANDAGES/DRESSINGS) ×1 IMPLANT
DRAPE C-ARM 42X72 X-RAY (DRAPES) ×2 IMPLANT
DRAPE UTILITY 15X26 W/TAPE STR (DRAPE) ×4 IMPLANT
ELECT REM PT RETURN 9FT ADLT (ELECTROSURGICAL) ×2
ELECTRODE REM PT RTRN 9FT ADLT (ELECTROSURGICAL) ×1 IMPLANT
FILTER SMOKE EVAC LAPAROSHD (FILTER) IMPLANT
GLOVE BIO SURGEON STRL SZ 6.5 (GLOVE) ×1 IMPLANT
GLOVE BIO SURGEON STRL SZ7.5 (GLOVE) ×1 IMPLANT
GLOVE BIO SURGEON STRL SZ8 (GLOVE) ×2 IMPLANT
GLOVE BIOGEL PI IND STRL 7.0 (GLOVE) IMPLANT
GLOVE BIOGEL PI IND STRL 8 (GLOVE) ×1 IMPLANT
GLOVE BIOGEL PI INDICATOR 7.0 (GLOVE) ×2
GLOVE BIOGEL PI INDICATOR 8 (GLOVE) ×1
GLOVE ECLIPSE 6.5 STRL STRAW (GLOVE) ×2 IMPLANT
GLOVE ECLIPSE 7.5 STRL STRAW (GLOVE) ×1 IMPLANT
GLOVE SS BIOGEL STRL SZ 7 (GLOVE) IMPLANT
GLOVE SUPERSENSE BIOGEL SZ 7 (GLOVE) ×1
GOWN PREVENTION PLUS XLARGE (GOWN DISPOSABLE) ×3 IMPLANT
GOWN STRL NON-REIN LRG LVL3 (GOWN DISPOSABLE) ×7 IMPLANT
HEMOSTAT SNOW SURGICEL 2X4 (HEMOSTASIS) ×1 IMPLANT
KIT BASIN OR (CUSTOM PROCEDURE TRAY) ×2 IMPLANT
KIT ROOM TURNOVER OR (KITS) ×2 IMPLANT
NS IRRIG 1000ML POUR BTL (IV SOLUTION) ×2 IMPLANT
PAD ARMBOARD 7.5X6 YLW CONV (MISCELLANEOUS) ×3 IMPLANT
POUCH SPECIMEN RETRIEVAL 10MM (ENDOMECHANICALS) ×2 IMPLANT
SCISSORS LAP 5X35 DISP (ENDOMECHANICALS) IMPLANT
SET CHOLANGIOGRAPH 5 50 .035 (SET/KITS/TRAYS/PACK) ×2 IMPLANT
SET IRRIG TUBING LAPAROSCOPIC (IRRIGATION / IRRIGATOR) ×2 IMPLANT
SLEEVE ADV FIXATION 5X100MM (TROCAR) ×4 IMPLANT
SPECIMEN JAR SMALL (MISCELLANEOUS) ×2 IMPLANT
SUT VIC AB 4-0 PS2 27 (SUTURE) ×2 IMPLANT
TOWEL OR 17X24 6PK STRL BLUE (TOWEL DISPOSABLE) ×2 IMPLANT
TOWEL OR 17X26 10 PK STRL BLUE (TOWEL DISPOSABLE) ×2 IMPLANT
TRAY LAPAROSCOPIC (CUSTOM PROCEDURE TRAY) ×2 IMPLANT
TROCAR HASSON GELL 12X100 (TROCAR) ×2 IMPLANT
TROCAR Z-THREAD FIOS 11X100 BL (TROCAR) IMPLANT
TROCAR Z-THREAD FIOS 5X100MM (TROCAR) ×2 IMPLANT
WATER STERILE IRR 1000ML POUR (IV SOLUTION) IMPLANT

## 2011-08-25 NOTE — Preoperative (Signed)
Beta Blockers   Reason not to administer Beta Blockers:Not Applicable 

## 2011-08-25 NOTE — Interval H&P Note (Signed)
History and Physical Interval Note:  08/25/2011 1:59 PM  Brianna Barnes  has presented today for surgery, with the diagnosis of biliary colic  The various methods of treatment have been discussed with the patient and family. After consideration of risks, benefits and other options for treatment, the patient has consented to  Procedure(s) (LRB): LAPAROSCOPIC CHOLECYSTECTOMY WITH INTRAOPERATIVE CHOLANGIOGRAM (N/A) as a surgical intervention .  The patients' history has been reviewed, patient examined, no change in status, stable for surgery.  I have reviewed the patients' chart and labs.  Questions were answered to the patient's satisfaction.   Please also see my note from today  Mina Babula E

## 2011-08-25 NOTE — ED Notes (Signed)
Patient transported to ultrasound.

## 2011-08-25 NOTE — Progress Notes (Signed)
Subjective: Pain better  Objective: Vital signs in last 24 hours: Temp:  [98.1 F (36.7 C)-98.3 F (36.8 C)] 98.3 F (36.8 C) (06/12 0606) Pulse Rate:  [70-90] 71  (06/12 0606) Resp:  [16-20] 16  (06/12 0606) BP: (97-134)/(43-58) 134/56 mmHg (06/12 0606) SpO2:  [94 %-99 %] 96 % (06/12 0606) Weight:  [50 kg (110 lb 3.7 oz)] 50 kg (110 lb 3.7 oz) (06/12 0504)    Intake/Output from previous day: 06/11 0701 - 06/12 0700 In: -  Out: 250 [Urine:250] Intake/Output this shift:    General appearance: alert and cooperative Resp: clear to auscultation bilaterally Cardio: regular rate and rhythm GI: soft, tender RUQ without guarding, no mass  Lab Results:   [facility] 08/24/11 2026  WBC 5.9  HGB 11.2*  HCT 32.9*  PLT 164   BMET  Basename 08/24/11 2026  NA 141  K 4.0  CL 108  CO2 24  GLUCOSE 123*  BUN 12  CREATININE 0.59  CALCIUM 8.3*   PT/INR No results found for this basename: LABPROT:2,INR:2 in the last 72 hours ABG No results found for this basename: PHART:2,PCO2:2,PO2:2,HCO3:2 in the last 72 hours  Studies/Results: Dg Chest 2 View  08/24/2011  *RADIOLOGY REPORT*  Clinical Data: Chest pain  CHEST - 2 VIEW  Comparison: None  Findings: The heart size and mediastinal contours are within normal limits.  Both lungs are clear.  The visualized skeletal structures are unremarkable.  IMPRESSION: No active cardiopulmonary abnormalities.  Original Report Authenticated By: Rosealee Albee, M.D.   US Abdomen Complete  08/25/2011  *RADIOLOGY REPORT*  Clinical Data:  Abdominal pain  COMPLETE ABDOMINAL ULTRASOUND  Comparison:  05/01/2011 ultrasound  Findings:  Gallbladder:  Distended, containing sludge and/or no shadowing stones.  Echogenic focus along the wall non dependently measures 4 mm.  No appreciable associated vascularity.  May represent to effective sludge or a polyp.  Wall thickness at 3 mm is upper normal.  Negative sonographic Murphy's sign.  Common bile duct:   Prominent at 1 cm.  The distal duct is obscured by overlying bowel gas artifact.  Liver:  Poorly visualized due to artifact/overlying bowel.  IVC:  Appears normal.  Pancreas:  Poorly visualized due to overlying bowel gas artifact.  Spleen:  Splenomegaly is again noted, measuring up to 13 cm in length.  Right Kidney:  Measures 10.2 cm.  No hydronephrosis or focal abnormality.  Left Kidney:  Measures 10.1 cm.  No hydronephrosis or focal abnormality.  Abdominal aorta:  Scattered atherosclerotic calcification. Portions of the aorta are obscured by overlying bowel gas artifact. Where seen, measures up to 2 cm in diameter.  IMPRESSION: Distended gallbladder containing sludge and/or nonshadowing stones. Wall thickness at upper normal limits.  Negative sonographic Murphy's sign.  If clinical concern persists, recommend HIDA scan follow up.  4 mm tumefactive sludge versus polyp along the gallbladder wall non dependently.  Prominent common bile duct at 1 cm.  Distal duct is obscured by overlying bowel gas artifact. Choledocholithiasis is not excluded. If warranted, follow-up with ERCP or MRCP.  Splenomegaly.  Original Report Authenticated By: Waneta Martins, M.D.    Anti-infectives: Anti-infectives     Start     Dose/Rate Route Frequency Ordered Stop   08/25/11 0515   ciprofloxacin (CIPRO) IVPB 400 mg        400 mg 200 mL/hr over 60 Minutes Intravenous Every 12 hours 08/25/11 0514            Assessment/Plan: s/p Procedure(s) (LRB): LAPAROSCOPIC CHOLECYSTECTOMY  WITH INTRAOPERATIVE CHOLANGIOGRAM (N/A) Biliary colic - to OR for lap chole/IOC. I discussed the procedure in detail.  We discussed the risks and benefits of a laparoscopic cholecystectomy and possible cholangiogram including, but not limited to bleeding, infection, injury to surrounding structures such as the intestine or liver, bile leak, retained gallstones, need to convert to an open procedure, prolonged diarrhea, blood clots such as  DVT,  common bile duct injury, anesthesia risks, and possible need for additional procedures.  The likelihood of improvement in symptoms and return to the patient's normal status is good. We discussed the typical post-operative recovery course.   LOS: 1 day    Ceylon Arenson E 08/25/2011

## 2011-08-25 NOTE — ED Notes (Signed)
Returned from ultrasound.

## 2011-08-25 NOTE — Anesthesia Procedure Notes (Signed)
Procedure Name: Intubation Date/Time: 08/25/2011 2:35 PM Performed by: Margaree Mackintosh Pre-anesthesia Checklist: Patient identified, Timeout performed, Emergency Drugs available, Suction available and Patient being monitored Patient Re-evaluated:Patient Re-evaluated prior to inductionOxygen Delivery Method: Circle system utilized Preoxygenation: Pre-oxygenation with 100% oxygen Intubation Type: IV induction Ventilation: Mask ventilation without difficulty and Oral airway inserted - appropriate to patient size Laryngoscope Size: Mac and 3 Grade View: Grade II Tube type: Oral Tube size: 7.0 mm Number of attempts: 1 Airway Equipment and Method: Stylet Placement Confirmation: ETT inserted through vocal cords under direct vision,  positive ETCO2 and breath sounds checked- equal and bilateral Secured at: 20 cm Tube secured with: Tape Dental Injury: Teeth and Oropharynx as per pre-operative assessment

## 2011-08-25 NOTE — Anesthesia Preprocedure Evaluation (Signed)
Anesthesia Evaluation  Patient identified by MRN, date of birth, ID band Patient awake    Reviewed: Allergy & Precautions, H&P , NPO status , Patient's Chart, lab work & pertinent test results  Airway Mallampati: I TM Distance: >3 FB Neck ROM: Full    Dental   Pulmonary          Cardiovascular hypertension, Pt. on medications     Neuro/Psych    GI/Hepatic GERD-  Medicated and Controlled,  Endo/Other    Renal/GU      Musculoskeletal   Abdominal   Peds  Hematology   Anesthesia Other Findings   Reproductive/Obstetrics                           Anesthesia Physical Anesthesia Plan  ASA: II  Anesthesia Plan: General   Post-op Pain Management:    Induction: Intravenous  Airway Management Planned: Oral ETT  Additional Equipment:   Intra-op Plan:   Post-operative Plan: Extubation in OR  Informed Consent: I have reviewed the patients History and Physical, chart, labs and discussed the procedure including the risks, benefits and alternatives for the proposed anesthesia with the patient or authorized representative who has indicated his/her understanding and acceptance.     Plan Discussed with: CRNA and Surgeon  Anesthesia Plan Comments:         Anesthesia Quick Evaluation  

## 2011-08-25 NOTE — Op Note (Signed)
08/24/2011 - 08/25/2011  4:00 PM  PATIENT:  Brianna Barnes  78 y.o. female  PRE-OPERATIVE DIAGNOSIS:  biliary colic  POST-OPERATIVE DIAGNOSIS:  biliary colic  PROCEDURE:  Procedure(s): LAPAROSCOPIC CHOLECYSTECTOMY WITH INTRAOPERATIVE CHOLANGIOGRAM  SURGEON:  Surgeon(s): Liz Malady, MD  PHYSICIAN ASSISTANT:   ASSISTANTS: Rachel Rutfield, PAS   ANESTHESIA:   local and general  EBL:  Total I/O In: 1000 [I.V.:1000] Out: -   BLOOD ADMINISTERED:none  DRAINS: none   SPECIMEN:  Excision  DISPOSITION OF SPECIMEN:  PATHOLOGY  COUNTS:  YES  DICTATION: .Dragon Dictation patient was admitted with gallstones and abdominal pain. Patient is brought for cholecystectomy. She was identified in the preop holding area. Informed consent was obtained. She is receiving intravenous antibiotics. She was brought to the operating room and general endotracheal anesthesia was administered by the anesthesia staff. Abdomen was prepped and draped in sterile fashion. We did a time out procedure. Infraumbilical region was infiltrated with quarter percent Marcaine with epinephrine. An umbilical incision was made. Subcutaneous tissues were dissected down revealing the anterior fascia. This was divided along the midline. Peritoneal cavity was entered under direct vision. 0 Vicryl pursestring suture was placed on the fascial opening. Hassan trocar was inserted into the abdomen. Abdomen was insufflated with carbon dioxide in standard fashion. Under direct vision a 5 mm epigastric and 25 mm right lateral ports were placed. Local was used at each port site. Abdomen the gallbladder was retracted superior medially. Multiple filmy omental adhesions were freed up and dissected off of the gallbladder revealing the body and part of the infundibulum. The duodenum was also stuck to the infundibulum. This was carefully dissected away using sharp dissection through filmy adhesions freeing up from the infundibulum completely.  Dissection then began laterally. Infundibulum was retracted inferolaterally. Dissection continued until a window was created between the cystic duct the infundibulum and the liver. Once we had an excellent Critical view there, a clip was placed proximally on the into the cystic duct junction. A small nick was made in the cystic duct. Cholangiogram catheter was inserted. Intraoperative cholangiogram was obtained demonstrating some dilatation of the common bile duct and what appeared to be 2 small air bubbles but no large filling defects. Contrast flowed into the duodenum. Cholangiogram catheter was removed. 3 clips were placed proximally on the cystic duct and it was divided. Further dissection of the cystic artery anterior branch. This was clipped twice proximally once distally and divided. Gallbladder was taken off the liver bed with Bovie cautery. We did encounter a posterior branch of the cystic artery. This was clipped twice proximally and divided distally with cautery. Gallbladder was taken to [facility] off the liver bed and placed in an Endo Catch bag. It was removed from the abdomen via the infraumbilical port site. The liver bed was then copiously irrigated. Cautery was used to achieve excellent hemostasis there. The area was irrigated again,  irrigation fluid returned clear. Liver bed was dry and clips remain in good position. A piece of Surgicel snow was applied to the liver bed. Remainder of irrigation fluid was evacuated. Pneumoperitoneum was released after removing ports under direct vision. Informed local fascia was closed by tying the 0 Vicryl pursestring suture with care not to trap the nature abdominal contents. All 4 wounds were copiously irrigated and the skin of each was closed with running 4 Vicryl subcuticular stitch followed by Dermabond. All counts were correct. Patient tolerated procedure well without apparent complication was taken recovery in stable condition.  PATIENT  DISPOSITION:  PACU -  hemodynamically stable.   Delay start of Pharmacological VTE agent (>24hrs) due to surgical blood loss or risk of bleeding:  no  Violeta Gelinas, MD, MPH, FACS Pager: [phone]  6/12/20134:00 PM

## 2011-08-25 NOTE — H&P (Signed)
Brianna Barnes is an 78 y.o. female.   Chief Complaint: abdominal pain HPI: pt presents with history of one day RUQ abdominal pain.  She had an episode a couple of months ago and was seen by Dr Daphine Deutscher.  U/S showed sludge and she opted to watch her symptoms.  Yesterday she developed severe RUQ abdominal pain with radiation to her back.  The pain would not go away bringer her to the ER.  U/S repeated which shows a distended gallbladder with negative Murphy's sign and CBD  1 cm.  Her pain will not completely resolve with pain meds and we are asked to consult at the request of DR Hyacinth Meeker.    Past Medical History  Diagnosis Date  . Anemia   . Osteoarthritis     hands, knees, and low back  . GERD (gastroesophageal reflux disease)   . Ulcer     peptic  . Osteoporosis   . Gallstone     Past Surgical History  Procedure Date  . External ear surgery 1980    left side  . Spine surgery 1990    lumbar, ruptured disc  . Knee surgery 2004    arthroscopic  . Appendectomy     Family History  Problem Relation Age of Onset  . Heart disease Mother   . Heart disease Father 38  . Hyperlipidemia Father   . Diabetes Father   . Cirrhosis Sister   . Diabetes Brother   . Heart disease Brother    Social History:  reports that she has never smoked. She does not have any smokeless tobacco history on file. She reports that she does not drink alcohol or use illicit drugs.  Allergies: No Known Allergies   (Not in a hospital admission)  Results for orders placed during the hospital encounter of 08/24/11 (from the past 48 hour(s))  CBC     Status: Abnormal   Collection Time   08/24/11  8:26 PM      Component Value Range Comment   WBC 5.9  4.0 - 10.5 K/uL    RBC 3.73 (*) 3.87 - 5.11 MIL/uL    Hemoglobin 11.2 (*) 12.0 - 15.0 g/dL    HCT 78.2 (*) 95.6 - 46.0 %    MCV 88.2  78.0 - 100.0 fL    MCH 30.0  26.0 - 34.0 pg    MCHC 34.0  30.0 - 36.0 g/dL    RDW 21.3  08.6 - 57.8 %    Platelets 164  150 - 400  K/uL   DIFFERENTIAL     Status: Abnormal   Collection Time   08/24/11  8:26 PM      Component Value Range Comment   Neutrophils Relative 78 (*) 43 - 77 %    Neutro Abs 4.6  1.7 - 7.7 K/uL    Lymphocytes Relative 11 (*) 12 - 46 %    Lymphs Abs 0.7  0.7 - 4.0 K/uL    Monocytes Relative 9  3 - 12 %    Monocytes Absolute 0.6  0.1 - 1.0 K/uL    Eosinophils Relative 1  0 - 5 %    Eosinophils Absolute 0.1  0.0 - 0.7 K/uL    Basophils Relative 0  0 - 1 %    Basophils Absolute 0.0  0.0 - 0.1 K/uL   LIPASE, BLOOD     Status: Normal   Collection Time   08/24/11  8:26 PM      Component Value Range  Comment   Lipase 38  11 - 59 U/L   COMPREHENSIVE METABOLIC PANEL     Status: Abnormal   Collection Time   08/24/11  8:26 PM      Component Value Range Comment   Sodium 141  135 - 145 mEq/L    Potassium 4.0  3.5 - 5.1 mEq/L    Chloride 108  96 - 112 mEq/L    CO2 24  19 - 32 mEq/L    Glucose, Bld 123 (*) 70 - 99 mg/dL    BUN 12  6 - 23 mg/dL    Creatinine, Ser 4.01  0.50 - 1.10 mg/dL    Calcium 8.3 (*) 8.4 - 10.5 mg/dL    Total Protein 5.8 (*) 6.0 - 8.3 g/dL    Albumin 3.3 (*) 3.5 - 5.2 g/dL    AST 33  0 - 37 U/L HEMOLYSIS AT THIS LEVEL MAY AFFECT RESULT   ALT 17  0 - 35 U/L    Alkaline Phosphatase 64  39 - 117 U/L    Total Bilirubin 0.4  0.3 - 1.2 mg/dL    GFR calc non Af Amer 84 (*) >90 mL/min    GFR calc Af Amer >90  >90 mL/min   POCT I-STAT TROPONIN I     Status: Normal   Collection Time   08/24/11  8:43 PM      Component Value Range Comment   Troponin i, poc 0.00  0.00 - 0.08 ng/mL    Comment 3            TROPONIN I     Status: Normal   Collection Time   08/25/11 12:00 AM      Component Value Range Comment   Troponin I <0.30  <0.30 ng/mL    Dg Chest 2 View  08/24/2011  *RADIOLOGY REPORT*  Clinical Data: Chest pain  CHEST - 2 VIEW  Comparison: None  Findings: The heart size and mediastinal contours are within normal limits.  Both lungs are clear.  The visualized skeletal structures are  unremarkable.  IMPRESSION: No active cardiopulmonary abnormalities.  Original Report Authenticated By: Rosealee Albee, M.D.   US Abdomen Complete  08/25/2011  *RADIOLOGY REPORT*  Clinical Data:  Abdominal pain  COMPLETE ABDOMINAL ULTRASOUND  Comparison:  05/01/2011 ultrasound  Findings:  Gallbladder:  Distended, containing sludge and/or no shadowing stones.  Echogenic focus along the wall non dependently measures 4 mm.  No appreciable associated vascularity.  May represent to effective sludge or a polyp.  Wall thickness at 3 mm is upper normal.  Negative sonographic Murphy's sign.  Common bile duct:  Prominent at 1 cm.  The distal duct is obscured by overlying bowel gas artifact.  Liver:  Poorly visualized due to artifact/overlying bowel.  IVC:  Appears normal.  Pancreas:  Poorly visualized due to overlying bowel gas artifact.  Spleen:  Splenomegaly is again noted, measuring up to 13 cm in length.  Right Kidney:  Measures 10.2 cm.  No hydronephrosis or focal abnormality.  Left Kidney:  Measures 10.1 cm.  No hydronephrosis or focal abnormality.  Abdominal aorta:  Scattered atherosclerotic calcification. Portions of the aorta are obscured by overlying bowel gas artifact. Where seen, measures up to 2 cm in diameter.  IMPRESSION: Distended gallbladder containing sludge and/or nonshadowing stones. Wall thickness at upper normal limits.  Negative sonographic Murphy's sign.  If clinical concern persists, recommend HIDA scan follow up.  4 mm tumefactive sludge versus polyp along the gallbladder wall non  dependently.  Prominent common bile duct at 1 cm.  Distal duct is obscured by overlying bowel gas artifact. Choledocholithiasis is not excluded. If warranted, follow-up with ERCP or MRCP.  Splenomegaly.  Original Report Authenticated By: Waneta Martins, M.D.    Review of Systems  Constitutional: Negative for fever and chills.  HENT: Negative.   Eyes: Negative.   Respiratory: Negative.   Cardiovascular:  Negative.   Gastrointestinal: Positive for abdominal pain.  Genitourinary: Negative.   Musculoskeletal: Negative.   Skin: Negative for itching and rash.  Neurological: Negative.   Endo/Heme/Allergies: Negative.   Psychiatric/Behavioral: Negative.     Blood pressure 113/58, pulse 81, temperature 98.1 F (36.7 C), temperature source Oral, resp. rate 18, SpO2 94.00%. Physical Exam  Constitutional: She is oriented to person, place, and time. She appears well-developed and well-nourished.  HENT:  Head: Normocephalic and atraumatic.  Eyes: EOM are normal. Pupils are equal, round, and reactive to light.  Neck: Normal range of motion. Neck supple.  Cardiovascular: Normal rate and regular rhythm.   Respiratory: Effort normal and breath sounds normal.  GI: She exhibits no distension and no mass. There is tenderness. There is no rebound and no guarding.  Musculoskeletal: Normal range of motion.  Neurological: She is alert and oriented to person, place, and time.  Skin: Skin is warm and dry.  Psychiatric: She has a normal mood and affect. Her behavior is normal. Judgment and thought content normal.     Assessment/Plan Biliary colic/  Gallbladder sludge. Since her pain will not resolve she will be admitted for pain management ,  IVF  And made NPO.  She is agreement to proceed with laparoscopic cholecystectomy during this admission.  Damaria Stofko A. 08/25/2011, 5:00 AM

## 2011-08-25 NOTE — Anesthesia Postprocedure Evaluation (Signed)
Anesthesia Post Note  Patient: Brianna Barnes  Procedure(s) Performed: Procedure(s) (LRB): LAPAROSCOPIC CHOLECYSTECTOMY WITH INTRAOPERATIVE CHOLANGIOGRAM (N/A)  Anesthesia type: general  Patient location: PACU  Post pain: Pain level controlled  Post assessment: Patient's Cardiovascular Status Stable  Last Vitals:  Filed Vitals:   08/25/11 1645  BP:   Pulse: 75  Temp:   Resp: 27    Post vital signs: Reviewed and stable  Level of consciousness: sedated  Complications: No apparent anesthesia complications

## 2011-08-25 NOTE — ED Provider Notes (Signed)
  Physical Exam  BP 113/58  Pulse 81  Temp 98.1 F (36.7 C) (Oral)  Resp 18  SpO2 94%  Physical Exam  ED Course  Procedures  MDM Abdomen reexamined, nontender in the right upper quadrant, sleeping peacefully without complaint. Labs reviewed, ultrasound reviewed, discussed case with patient and with the general surgeon on call who agrees to evaluate the patient.      Vida Roller, MD 08/25/11 [phone]

## 2011-08-25 NOTE — ED Provider Notes (Signed)
History     CSN: [id number]  Arrival date & time 08/24/11  1943   First MD Initiated Contact with Patient 08/24/11 1959      Chief Complaint  Patient presents with  . Chest Pain     HPI Patient presented with epigastric lower chest pain which radiated to her l mid back area which started just prior to arrival.  Patient has had previous history of signing and was determined that it was her gallbladder.  This occurred a few months ago.  As patient arrived in emergency apartment her pain was gone.  She denies fever chills diaphoresis.  Denies shortness of breath. Past Medical History  Diagnosis Date  . Anemia   . Osteoarthritis     hands, knees, and low back  . GERD (gastroesophageal reflux disease)   . Ulcer     peptic  . Osteoporosis   . Gallstone     Past Surgical History  Procedure Date  . External ear surgery 1980    left side  . Spine surgery 1990    lumbar, ruptured disc  . Knee surgery 2004    arthroscopic  . Appendectomy   . Cholecystectomy 08/25/2011    Procedure: LAPAROSCOPIC CHOLECYSTECTOMY WITH INTRAOPERATIVE CHOLANGIOGRAM;  Surgeon: Liz Malady, MD;  Location: [facility] OR;  Service: General;  Laterality: N/A;    Family History  Problem Relation Age of Onset  . Heart disease Mother   . Heart disease Father 83  . Hyperlipidemia Father   . Diabetes Father   . Cirrhosis Sister   . Diabetes Brother   . Heart disease Brother     History  Substance Use Topics  . Smoking status: Never Smoker   . Smokeless tobacco: Not on file  . Alcohol Use: No    OB History    Grav Para Term Preterm Abortions TAB SAB Ect Mult Living                  Review of Systems  All other systems reviewed and are negative.    Allergies  Review of patient's allergies indicates no known allergies.  Home Medications   Current Outpatient Rx  Name Route Sig Dispense Refill  . ACETAMINOPHEN 325 MG PO TABS Oral Take 325 mg by mouth every 6 (six) hours as needed. For pain     . ASPIRIN EC 81 MG PO TBEC Oral Take 81 mg by mouth daily.    Marland Kitchen CALCIUM CARBONATE-VITAMIN D 500-200 MG-UNIT PO TABS Oral Take 1 tablet by mouth daily.    Marland Kitchen VITAMIN D3 400 UNITS PO CAPS Oral Take 400 mg by mouth 2 (two) times daily.      Marland Kitchen FERROUS SULFATE 325 (65 FE) MG PO TABS Oral Take 325 mg by mouth daily with breakfast.     . OMEPRAZOLE 20 MG PO CPDR Oral Take 20 mg by mouth 2 (two) times daily.    Marland Kitchen VITAMIN E 400 UNITS PO CAPS Oral Take 400 Units by mouth daily.    . ALENDRONATE SODIUM 70 MG PO TABS Oral Take 70 mg by mouth every 7 (seven) days. On Friday    . OXYCODONE HCL 5 MG PO TABS Oral Take 1-2 tablets (5-10 mg total) by mouth every 4 (four) hours as needed for pain. 30 tablet 0    BP 105/47  Pulse 96  Temp 100.2 F (37.9 C) (Oral)  Resp 16  Ht 4\' 11"  (1.499 m)  Wt 110 lb 3.7 oz (50 kg)  BMI 22.26 kg/m2  SpO2 98%  Physical Exam  Nursing note and vitals reviewed. Constitutional: She is oriented to person, place, and time. She appears well-developed and well-nourished. No distress.  HENT:  Head: Normocephalic and atraumatic.  Eyes: Pupils are equal, round, and reactive to light.  Neck: Normal range of motion.  Cardiovascular: Normal rate and intact distal pulses.   Pulmonary/Chest: No respiratory distress.  Abdominal: Soft. Normal appearance and bowel sounds are normal. She exhibits no distension. There is tenderness in the right upper quadrant.    Musculoskeletal: Normal range of motion.  Neurological: She is alert and oriented to person, place, and time. No cranial nerve deficit.  Skin: Skin is warm and dry. No rash noted.  Psychiatric: She has a normal mood and affect. Her behavior is normal.    ED Course  Procedures (including critical care time) Scheduled Meds:   Continuous Infusions:   PRN Meds:.    Labs Reviewed  CBC - Abnormal; Notable for the following:    RBC 3.73 (*)     Hemoglobin 11.2 (*)     HCT 32.9 (*)     All other components within  normal limits  DIFFERENTIAL - Abnormal; Notable for the following:    Neutrophils Relative 78 (*)     Lymphocytes Relative 11 (*)     All other components within normal limits  COMPREHENSIVE METABOLIC PANEL - Abnormal; Notable for the following:    Glucose, Bld 123 (*)     Calcium 8.3 (*)     Total Protein 5.8 (*)     Albumin 3.3 (*)     GFR calc non Af Amer 84 (*)     All other components within normal limits  COMPREHENSIVE METABOLIC PANEL - Abnormal; Notable for the following:    Glucose, Bld 121 (*)     BUN 5 (*)     Calcium 8.1 (*)     Total Protein 5.1 (*)     Albumin 2.9 (*)     AST 106 (*)     ALT 73 (*)     GFR calc non Af Amer 85 (*)     All other components within normal limits  CBC - Abnormal; Notable for the following:    RBC 3.64 (*)     Hemoglobin 11.2 (*)     HCT 33.2 (*)     Platelets 127 (*)     All other components within normal limits  LIPASE, BLOOD  POCT I-STAT TROPONIN I  TROPONIN I  SURGICAL PCR SCREEN  SURGICAL PATHOLOGY  LAB REPORT - SCANNED     Results of gallbladder ultrasound    IMPRESSION: Distended gallbladder containing sludge and/or nonshadowing stones. Wall thickness at upper normal limits. Negative sonographic Murphy's sign. If clinical concern persists, recommend HIDA scan follow up.  4 mm tumefactive sludge versus polyp along the gallbladder wall non dependently.  Prominent common bile duct at 1 cm. Distal duct is obscured by overlying bowel gas artifact. Choledocholithiasis is not excluded. If warranted, follow-up with ERCP or MRCP.  Splenomegaly.  Original Report Authenticated By: Waneta Martins, M.D   1. Biliary colic       MDM        Nelia Shi, MD 08/29/11 1534

## 2011-08-25 NOTE — Transfer of Care (Signed)
Immediate Anesthesia Transfer of Care Note  Patient: Brianna Barnes  Procedure(s) Performed: Procedure(s) (LRB): LAPAROSCOPIC CHOLECYSTECTOMY WITH INTRAOPERATIVE CHOLANGIOGRAM (N/A)  Patient Location: PACU  Anesthesia Type: General  Level of Consciousness: awake and alert   Airway & Oxygen Therapy: Patient Spontanous Breathing and Patient connected to nasal cannula oxygen  Post-op Assessment: Report given to PACU RN and Post -op Vital signs reviewed and stable  Post vital signs: Reviewed and stable  Complications: No apparent anesthesia complications

## 2011-08-26 LAB — CBC
MCV: 91.2 fL (ref 78.0–100.0)
Platelets: 127 10*3/uL — ABNORMAL LOW (ref 150–400)
RBC: 3.64 MIL/uL — ABNORMAL LOW (ref 3.87–5.11)
WBC: 6.5 10*3/uL (ref 4.0–10.5)

## 2011-08-26 LAB — COMPREHENSIVE METABOLIC PANEL
Albumin: 2.9 g/dL — ABNORMAL LOW (ref 3.5–5.2)
BUN: 5 mg/dL — ABNORMAL LOW (ref 6–23)
Chloride: 105 mEq/L (ref 96–112)
Creatinine, Ser: 0.57 mg/dL (ref 0.50–1.10)
Total Bilirubin: 0.8 mg/dL (ref 0.3–1.2)
Total Protein: 5.1 g/dL — ABNORMAL LOW (ref 6.0–8.3)

## 2011-08-26 MED ORDER — ACETAMINOPHEN 325 MG PO TABS
650.0000 mg | ORAL_TABLET | Freq: Four times a day (QID) | ORAL | Status: DC | PRN
  Administered 2011-08-26: 650 mg via ORAL
  Filled 2011-08-26: qty 2

## 2011-08-26 MED ORDER — PHENOL 1.4 % MT LIQD
1.0000 | OROMUCOSAL | Status: DC | PRN
  Filled 2011-08-26: qty 177

## 2011-08-26 MED ORDER — ONDANSETRON HCL 4 MG PO TABS: 4.0000 mg | ORAL_TABLET | Freq: Four times a day (QID) | ORAL | Status: DC | PRN

## 2011-08-26 MED ORDER — PANTOPRAZOLE SODIUM 40 MG PO TBEC
40.0000 mg | DELAYED_RELEASE_TABLET | Freq: Every day | ORAL | Status: DC
  Administered 2011-08-26: 40 mg via ORAL
  Filled 2011-08-26: qty 1

## 2011-08-26 MED ORDER — OXYCODONE HCL 5 MG PO TABS
5.0000 mg | ORAL_TABLET | ORAL | Status: DC | PRN
  Administered 2011-08-26: 10 mg via ORAL
  Administered 2011-08-26: 5 mg via ORAL
  Administered 2011-08-26: 10 mg via ORAL
  Administered 2011-08-26: 5 mg via ORAL
  Administered 2011-08-27: 10 mg via ORAL
  Filled 2011-08-26 (×2): qty 2
  Filled 2011-08-26: qty 1
  Filled 2011-08-26 (×2): qty 2
  Filled 2011-08-26: qty 1

## 2011-08-26 NOTE — Progress Notes (Signed)
1 Day Post-Op  Subjective: Generally feels good this am, sore generalized soreness from the surgery, but no c/o of bloating,nausea,emesis. Positive flatus, no BM. Objective: Vital signs in last 24 hours: Temp:  [97.5 F (36.4 C)-99.3 F (37.4 C)] 99.3 F (37.4 C) (06/13 0658) Pulse Rate:  [59-102] 101  (06/13 0658) Resp:  [12-27] 18  (06/13 0658) BP: (110-142)/(53-77) 110/53 mmHg (06/13 0658) SpO2:  [89 %-100 %] 89 % (06/13 0658) Last BM Date: 08/24/11  Intake/Output from previous day: 06/12 0701 - 06/13 0700 In: 2584.5 [P.O.:240; I.V.:2344.5] Out: 605 [Urine:575; Blood:30] Intake/Output this shift:    General appearance: alert, cooperative, appears stated age and no distress Abdomen: All surgical incision sites appear well approximated, some ecchymosis seen around umbilicus, BS present, no bloating. Tender to palpation around surgical wound sites only.  Lab Results:   [facility] 08/26/11 0635 08/24/11 2026  WBC 6.5 5.9  HGB 11.2* 11.2*  HCT 33.2* 32.9*  PLT 127* 164   BMET  Basename 08/26/11 0635 08/24/11 2026  NA 138 141  K 4.0 4.0  CL 105 108  CO2 27 24  GLUCOSE 121* 123*  BUN 5* 12  CREATININE 0.57 0.59  CALCIUM 8.1* 8.3*   PT/INR No results found for this basename: LABPROT:2,INR:2 in the last 72 hours ABG No results found for this basename: PHART:2,PCO2:2,PO2:2,HCO3:2 in the last 72 hours  Studies/Results: Dg Chest 2 View  08/24/2011  *RADIOLOGY REPORT*  Clinical Data: Chest pain  CHEST - 2 VIEW  Comparison: None  Findings: The heart size and mediastinal contours are within normal limits.  Both lungs are clear.  The visualized skeletal structures are unremarkable.  IMPRESSION: No active cardiopulmonary abnormalities.  Original Report Authenticated By: Rosealee Albee, M.D.   Dg Cholangiogram Operative  08/25/2011  *RADIOLOGY REPORT*  Clinical Data:   Lap cholecystectomy.  View cystic and common duct.  INTRAOPERATIVE CHOLANGIOGRAM  Technique:   Cholangiographic images from the C-arm fluoroscopic device were submitted for interpretation post-operatively.  Please see the procedural report for the amount of contrast and the fluoroscopy time utilized.  Comparison:  Ultrasound abdomen dated 08/25/2011.  Findings:  Cholecystectomy clips.  Very mild intrahepatic ductal dilatation.  Common duct is mildly dilated and irregular distally near its insertion at the ampulla.  A few small mobile filling defects are present in the distal common bile duct, at least one of which deforms as contrast passes through into the proximal small bowel.  While indeterminate, this appearance suggests air bubbles rather than true common duct stones.  IMPRESSION: Status post cholecystectomy.  Mild intrahepatic and extrahepatic ductal dilatation.  A few mobile filling defects in the distal common bile duct, favored to reflect gas bubbles rather than true stones.  Contrast passes successfully into the proximal small bowel.  Original Report Authenticated By: Charline Bills, M.D.   US Abdomen Complete  08/25/2011  *RADIOLOGY REPORT*  Clinical Data:  Abdominal pain  COMPLETE ABDOMINAL ULTRASOUND  Comparison:  05/01/2011 ultrasound  Findings:  Gallbladder:  Distended, containing sludge and/or no shadowing stones.  Echogenic focus along the wall non dependently measures 4 mm.  No appreciable associated vascularity.  May represent to effective sludge or a polyp.  Wall thickness at 3 mm is upper normal.  Negative sonographic Murphy's sign.  Common bile duct:  Prominent at 1 cm.  The distal duct is obscured by overlying bowel gas artifact.  Liver:  Poorly visualized due to artifact/overlying bowel.  IVC:  Appears normal.  Pancreas:  Poorly visualized due to  overlying bowel gas artifact.  Spleen:  Splenomegaly is again noted, measuring up to 13 cm in length.  Right Kidney:  Measures 10.2 cm.  No hydronephrosis or focal abnormality.  Left Kidney:  Measures 10.1 cm.  No hydronephrosis or focal  abnormality.  Abdominal aorta:  Scattered atherosclerotic calcification. Portions of the aorta are obscured by overlying bowel gas artifact. Where seen, measures up to 2 cm in diameter.  IMPRESSION: Distended gallbladder containing sludge and/or nonshadowing stones. Wall thickness at upper normal limits.  Negative sonographic Murphy's sign.  If clinical concern persists, recommend HIDA scan follow up.  4 mm tumefactive sludge versus polyp along the gallbladder wall non dependently.  Prominent common bile duct at 1 cm.  Distal duct is obscured by overlying bowel gas artifact. Choledocholithiasis is not excluded. If warranted, follow-up with ERCP or MRCP.  Splenomegaly.  Original Report Authenticated By: Waneta Martins, M.D.    Anti-infectives: Anti-infectives     Start     Dose/Rate Route Frequency Ordered Stop   08/25/11 0515   ciprofloxacin (CIPRO) IVPB 400 mg  Status:  Discontinued        400 mg 200 mL/hr over 60 Minutes Intravenous Every 12 hours 08/25/11 0514 08/25/11 1716          Assessment/Plan: s/p Procedure(s) (LRB): LAPAROSCOPIC CHOLECYSTECTOMY WITH INTRAOPERATIVE CHOLANGIOGRAM (N/A) 1. Probable discharge later today 2. Await Dr. Carollee Massed opinion as LFT's remain elevated.  LOS: 2 days    Brianna Barnes 08/26/2011

## 2011-08-26 NOTE — Progress Notes (Signed)
Still some nausea Will keep until tomorrow Patient examined and I agree with the assessment and plan  Violeta Gelinas, MD, MPH, FACS Pager: [phone]  08/26/2011 4:00 PM

## 2011-08-26 NOTE — Discharge Summary (Signed)
Physician Discharge Summary  Patient ID: Brianna Barnes MRN: [id number] DOB/AGE: 08-22-1928 78 y.o.  Admit date: 08/24/2011 Discharge date: 08/27/11  Admission Diagnoses: abdominal pain  Discharge Diagnoses: status post cholecystectomy   Discharged Condition: stable  Hospital Course: presents with history of one day RUQ abdominal pain. She had an episode a couple of months ago and was seen by Dr Daphine Deutscher. U/S showed sludge and she opted to watch her symptoms. Yesterday she developed severe RUQ abdominal pain with radiation to her back. The pain would not go away bringer her to the ER. U/S repeated which shows a distended gallbladder with negative Murphy's sign and CBD 1 cm. Her pain will not completely resolve with pain meds and we are asked to consult at the request of DR Hyacinth Meeker.    Consults: None  Significant Diagnostic Studies: labs and microbiology  Treatments: IV hydration, antibiotics, analgesia, and surgery  Discharge Exam: General appearance: alert, cooperative, appears stated age and no distress Abdomen: all surgical wounds are well approximated, abdomen is non distended, tender to palpation over surgical wounds only. Positive BS, Positive for flatus, no bloating,emesis. Disposition: 01-Home or Self Care with follow-up with DR. Thompson in 3 weeks time.  Discharge Orders    Future Appointments: Provider: Department: Dept Phone: Center:   01/18/2012 10:00 AM [name] Michelle Nasuti, MD [facility] [phone] [id number]     Medication List  As of 08/26/2011 11:02 AM   ASK your doctor about these medications         acetaminophen 325 MG tablet   Commonly known as: TYLENOL   Take 325 mg by mouth every 6 (six) hours as needed. For pain      alendronate 70 MG tablet   Commonly known as: FOSAMAX   Take 70 mg by mouth every 7 (seven) days. On Friday      aspirin EC 81 MG tablet   Take 81 mg by mouth daily.      calcium-vitamin D 500-200 MG-UNIT per tablet   Commonly known as:  OSCAL WITH D   Take 1 tablet by mouth daily.      ferrous sulfate 325 (65 FE) MG tablet   Take 325 mg by mouth daily with breakfast.      omeprazole 20 MG capsule   Commonly known as: PRILOSEC   Take 20 mg by mouth 2 (two) times daily.      Vitamin D3 400 UNITS Caps   Take 400 mg by mouth 2 (two) times daily.      vitamin E 400 UNIT capsule   Take 400 Units by mouth daily.             Signed:

## 2011-08-27 ENCOUNTER — Encounter (HOSPITAL_COMMUNITY): Payer: Self-pay | Admitting: General Surgery

## 2011-08-27 MED ORDER — OXYCODONE HCL 5 MG PO TABS: 5.0000 mg | ORAL_TABLET | ORAL | Status: AC | PRN

## 2011-08-27 NOTE — Progress Notes (Signed)
Doing much better today Patient examined and I agree with the assessment and plan  Violeta Gelinas, MD, MPH, FACS Pager: [phone]  08/27/2011 4:23 PM

## 2011-08-27 NOTE — Discharge Instructions (Signed)
Biliary Colic  Biliary colic is a steady or irregular pain in the upper abdomen. It is usually under the right side of the rib cage. It happens when gallstones interfere with the normal flow of bile from the gallbladder. Bile is a liquid that helps to digest fats. Bile is made in the liver and stored in the gallbladder. When you eat a meal, bile passes from the gallbladder through the cystic duct and the common bile duct into the small intestine. There, it mixes with partially digested food. If a gallstone blocks either of these ducts, the normal flow of bile is blocked. The muscle cells in the bile duct contract forcefully to try to move the stone. This causes the pain of biliary colic.  SYMPTOMS   A person with biliary colic usually complains of pain in the upper abdomen. This pain can be:   In the center of the upper abdomen just below the breastbone.   In the upper-right part of the abdomen, near the gallbladder and liver.   Spread back toward the right shoulder blade.   Nausea and vomiting.   The pain usually occurs after eating.   Biliary colic is usually triggered by the digestive system's demand for bile. The demand for bile is high after fatty meals. Symptoms can also occur when a person who has been fasting suddenly eats a very large meal. Most episodes of biliary colic pass after 1 to 5 hours. After the most intense pain passes, your abdomen may continue to ache mildly for about 24 hours.  DIAGNOSIS  After you describe your symptoms, your caregiver will perform a physical exam. He or she will pay attention to the upper right portion of your belly (abdomen). This is the area of your liver and gallbladder. An ultrasound will help your caregiver look for gallstones. Specialized scans of the gallbladder may also be done. Blood tests may be done, especially if you have fever or if your pain persists. PREVENTION  Biliary colic can be prevented by controlling the risk factors for gallstones.  Some of these risk factors, such as heredity, increasing age, and pregnancy are a normal part of life. Obesity and a high-fat diet are risk factors you can change through a healthy lifestyle. Women going through menopause who take hormone replacement therapy (estrogen) are also more likely to develop biliary colic. TREATMENT   Pain medication may be prescribed.   You may be encouraged to eat a fat-free diet.   If the first episode of biliary colic is severe, or episodes of colic keep retuning, surgery to remove the gallbladder (cholecystectomy) is usually recommended. This procedure can be done through small incisions using an instrument called a laparoscope. The procedure often requires a brief stay in the hospital. Some people can leave the hospital the same day. It is the most widely used treatment in people troubled by painful gallstones. It is effective and safe, with no complications in more than 90% of cases.   If surgery cannot be done, medication that dissolves gallstones may be used. This medication is expensive and can take months or years to work. Only small stones will dissolve.   Rarely, medication to dissolve gallstones is combined with a procedure called shock-wave lithotripsy. This procedure uses carefully aimed shock waves to break up gallstones. In many people treated with this procedure, gallstones form again within a few years.  PROGNOSIS  If gallstones block your cystic duct or common bile duct, you are at risk for repeated episodes of   duct, you are at risk for repeated episodes of biliary colic. There is also a 25% chance that you will develop a gallbladder infection(acute cholecystitis), or some other complication of gallstones within 10 to 20 years. If you have surgery, schedule it at a time that is convenient for you and at a time when you are not sick.  HOME CARE INSTRUCTIONS    Drink plenty of clear fluids.   Avoid fatty, greasy or fried foods, or any foods that make your pain worse.   Take medications as directed.  SEEK MEDICAL  CARE IF:    You develop a fever over 100.5 F (38.1 C).   Your pain gets worse over time.   You develop nausea that prevents you from eating and drinking.   You develop vomiting.  SEEK IMMEDIATE MEDICAL CARE IF:    You have continuous or severe belly (abdominal) pain which is not relieved with medications.   You develop nausea and vomiting which is not relieved with medications.   You have symptoms of biliary colic and you suddenly develop a fever and shaking chills. This may signal cholecystitis. Call your caregiver immediately.   You develop a yellow color to your skin or the white part of your eyes (jaundice).  Document Released: 08/02/2005 Document Revised: 02/18/2011 Document Reviewed: 10/12/2007  Exit[employer] Patient Information 2012 [employer].

## 2011-08-27 NOTE — Progress Notes (Signed)
2 Days Post-Op  Subjective: Feels good this morning, offers no new c/o, slept well last night .  Objective: Vital signs in last 24 hours: Temp:  [98 F (36.7 C)-100.5 F (38.1 C)] 100.2 F (37.9 C) (06/14 5366) Pulse Rate:  [96-101] 96  (06/14 0608) Resp:  [16-18] 16  (06/14 0608) BP: (105-122)/(47-61) 105/47 mmHg (06/14 0608) SpO2:  [92 %-98 %] 98 % (06/14 0608) Last BM Date: 08/24/11  Intake/Output from previous day: 06/13 0701 - 06/14 0700 In: 100 [P.O.:100] Out: 225 [Urine:225] Intake/Output this shift:    General appearance: alert, cooperative, appears stated age and no distress Abdomen: all surgical incision sites appear well approximated, mild ecchymosis noted over umbilicus; unchanged from previous examination. No bloating,nausea,emesis reported. Positive BS,Flatus. Chest: CTA bilaterally Cardiac: NRR, no murmurs,rubs or gallops heard, S1-S2.  Lab Results:   [facility] 08/26/11 0635 08/24/11 2026  WBC 6.5 5.9  HGB 11.2* 11.2*  HCT 33.2* 32.9*  PLT 127* 164   BMET  Basename 08/26/11 0635 08/24/11 2026  NA 138 141  K 4.0 4.0  CL 105 108  CO2 27 24  GLUCOSE 121* 123*  BUN 5* 12  CREATININE 0.57 0.59  CALCIUM 8.1* 8.3*   PT/INR No results found for this basename: LABPROT:2,INR:2 in the last 72 hours ABG No results found for this basename: PHART:2,PCO2:2,PO2:2,HCO3:2 in the last 72 hours  Studies/Results: Dg Cholangiogram Operative  08/25/2011  *RADIOLOGY REPORT*  Clinical Data:   Lap cholecystectomy.  View cystic and common duct.  INTRAOPERATIVE CHOLANGIOGRAM  Technique:  Cholangiographic images from the C-arm fluoroscopic device were submitted for interpretation post-operatively.  Please see the procedural report for the amount of contrast and the fluoroscopy time utilized.  Comparison:  Ultrasound abdomen dated 08/25/2011.  Findings:  Cholecystectomy clips.  Very mild intrahepatic ductal dilatation.  Common duct is mildly dilated and irregular distally near  its insertion at the ampulla.  A few small mobile filling defects are present in the distal common bile duct, at least one of which deforms as contrast passes through into the proximal small bowel.  While indeterminate, this appearance suggests air bubbles rather than true common duct stones.  IMPRESSION: Status post cholecystectomy.  Mild intrahepatic and extrahepatic ductal dilatation.  A few mobile filling defects in the distal common bile duct, favored to reflect gas bubbles rather than true stones.  Contrast passes successfully into the proximal small bowel.  Original Report Authenticated By: Charline Bills, M.D.    Anti-infectives: Anti-infectives     Start     Dose/Rate Route Frequency Ordered Stop   08/25/11 0515   ciprofloxacin (CIPRO) IVPB 400 mg  Status:  Discontinued        400 mg 200 mL/hr over 60 Minutes Intravenous Every 12 hours 08/25/11 0514 08/25/11 1716          Assessment/Plan: s/p Procedure(s) (LRB): LAPAROSCOPIC CHOLECYSTECTOMY WITH INTRAOPERATIVE CHOLANGIOGRAM (N/A) Discharge to home with follow up with Dr. Janee Morn in 3 weeks.   LOS: 3 days    Brianna Barnes 08/27/2011

## 2011-08-27 NOTE — Discharge Summary (Signed)
Brianna Kueker, MD, MPH, FACS Pager: [phone]  

## 2011-08-27 NOTE — Discharge Summary (Signed)
Physician Discharge Summary  Patient ID: Brianna Barnes MRN: [id number] DOB/AGE: 09/24/1928 78 y.o.  Admit date: 08/24/2011 Discharge date: 08/27/2011  Admission Diagnoses:Biliary Colic  Discharge Diagnoses: status post cholysetectomy  Discharged Condition: stable  Hospital Course: patient presented to [facility] with one day hx abdominal pain with an earlier episode a couple of months ago and was seen by Dr Daphine Deutscher. U/S showed sludge and she opted to watch her symptoms. Prior to presenting to [facility] she developed severe RUQ abdominal pain with radiation to her back. The pain would not go away which prompted her presentation to the ER. An U/S which was repeated and showed a distended gallbladder with CBD 1 cm. Since her pain was not alleviated with pain meds; surgical service was consulted and she was then scheduled for an elective cholecystectomy which she had done. Postoperatively she has progressed well, has had only minimal nausea initially which has resolved and has been able to ambulate and tolerate a diet. She has remained hemodynamically stable and afebrile postoperatively.  Consults: none  Significant Diagnostic Studies:   Treatments: Surgery,US, antibiotics,IVF,analgesia,antiemetics, microbiology.  Discharge Exam: Blood pressure 105/47, pulse 96, temperature 100.2 F (37.9 C), temperature source Oral, resp. rate 16, height 4\' 11"  (1.499 m), weight 110 lb 3.7 oz (50 kg), SpO2 98.00%. General appearance: alert, cooperative, appears stated age and no distress Abdomen: soft, non-tender, no bloating, positive bowel signs, no report of emesis. All surgical wounds appear well approximated, minimal ecchymosis is noted over the umbilicus; but this is unchanged from previous examinations. Chest: CTA bilaterally Cardiac: S1-S2, RRR.  Disposition: 01-Home or Self Care folllow up with Dr. Janee Morn in 3 weeks.  Discharge Orders    Future Appointments: Provider: Department: Dept Phone: Center:   01/18/2012 10:00 AM [name] Michelle Nasuti, MD [facility] [phone] [id number]     Medication List  As of 08/27/2011  8:47 AM   ASK your doctor about these medications         acetaminophen 325 MG tablet   Commonly known as: TYLENOL   Take 325 mg by mouth every 6 (six) hours as needed. For pain      alendronate 70 MG tablet   Commonly known as: FOSAMAX   Take 70 mg by mouth every 7 (seven) days. On Friday      aspirin EC 81 MG tablet   Take 81 mg by mouth daily.      calcium-vitamin D 500-200 MG-UNIT per tablet   Commonly known as: OSCAL WITH D   Take 1 tablet by mouth daily.      ferrous sulfate 325 (65 FE) MG tablet   Take 325 mg by mouth daily with breakfast.      omeprazole 20 MG capsule   Commonly known as: PRILOSEC   Take 20 mg by mouth 2 (two) times daily.      Vitamin D3 400 UNITS Caps   Take 400 mg by mouth 2 (two) times daily.      vitamin E 400 UNIT capsule   Take 400 Units by mouth daily.           Follow-up Information    Follow up with [facility], MD in 3 weeks. (If symptoms worsen call office)    Contact information:   [facility] [place] [address] [place] [address] [phone]          Signed: Blenda Mounts 08/27/2011, 8:47 AM

## 2011-08-27 NOTE — Progress Notes (Signed)
Patient and family given discharge information and prescription. Taken by wheelchair to personal vehicle.

## 2011-08-27 NOTE — Discharge Summary (Signed)
Brianna Giroux, MD, MPH, FACS Pager: [phone]  

## 2011-08-30 ENCOUNTER — Telehealth (INDEPENDENT_AMBULATORY_CARE_PROVIDER_SITE_OTHER): Payer: Self-pay

## 2011-08-30 NOTE — Telephone Encounter (Signed)
I called the pt and gave her a post op appt for 7/10.

## 2011-08-31 ENCOUNTER — Other Ambulatory Visit: Payer: Self-pay | Admitting: Family Medicine

## 2011-09-20 ENCOUNTER — Other Ambulatory Visit: Payer: Self-pay | Admitting: *Deleted

## 2011-09-20 MED ORDER — OMEPRAZOLE 20 MG PO CPDR: 20.0000 mg | DELAYED_RELEASE_CAPSULE | Freq: Every day | ORAL | Status: DC

## 2011-09-22 ENCOUNTER — Encounter (INDEPENDENT_AMBULATORY_CARE_PROVIDER_SITE_OTHER): Payer: Self-pay | Admitting: General Surgery

## 2011-09-22 ENCOUNTER — Ambulatory Visit (INDEPENDENT_AMBULATORY_CARE_PROVIDER_SITE_OTHER): Payer: Medicare Other | Admitting: General Surgery

## 2011-09-22 VITALS — BP 121/88 | HR 80 | Temp 97.2°F | Resp 16 | Ht <= 58 in | Wt 107.4 lb

## 2011-09-22 DIAGNOSIS — Z9889 Other specified postprocedural states: Secondary | ICD-10-CM

## 2011-09-22 DIAGNOSIS — Z9049 Acquired absence of other specified parts of digestive tract: Secondary | ICD-10-CM | POA: Insufficient documentation

## 2011-09-22 NOTE — Progress Notes (Signed)
Subjective:     Patient ID: Brianna Barnes, female   DOB: [birth year], 78 y.o.   MRN: [id number]  HPI The patient is status post laparoscopic cholecystectomy on June 17. She is doing well postoperatively. She is no longer taking any pain medication. She initially had some constipation but that has resolved. She has a little soreness to her umbilicus.  Review of Systems     Objective:   Physical Exam Abdomen soft and nontender. Incisions are healing well. There is no evidence of hernia.    Assessment:     Doing well after laparoscopic cholecystectomy    Plan:     Patient's pathology report showed chronic cholecystitis was discussed with her and her daughter. I think the soreness to her umbilicus is muscular and she gradually improved with time. I will see her back as needed.

## 2011-11-02 ENCOUNTER — Other Ambulatory Visit: Payer: Self-pay | Admitting: Family Medicine

## 2011-11-03 ENCOUNTER — Other Ambulatory Visit: Payer: Self-pay | Admitting: Family Medicine

## 2011-12-06 ENCOUNTER — Other Ambulatory Visit: Payer: Self-pay | Admitting: Family Medicine

## 2011-12-06 DIAGNOSIS — Z1231 Encounter for screening mammogram for malignant neoplasm of breast: Secondary | ICD-10-CM

## 2011-12-10 ENCOUNTER — Telehealth: Payer: Self-pay | Admitting: Family Medicine

## 2011-12-10 NOTE — Telephone Encounter (Signed)
The patient called and is hoping to get her rx of Fosamax 70mg  moved from [facility] to the [facility] pharmacy in [place].  She is hoping her pharmacy can be changed to [facility] for all future rx refills.  Thanks!

## 2011-12-13 MED ORDER — ALENDRONATE SODIUM 70 MG PO TABS: ORAL_TABLET | ORAL | Status: DC

## 2011-12-13 NOTE — Telephone Encounter (Signed)
Fosamax called to [facility].

## 2011-12-15 ENCOUNTER — Ambulatory Visit (HOSPITAL_COMMUNITY)
Admission: RE | Admit: 2011-12-15 | Discharge: 2011-12-15 | Disposition: A | Discharge status: Home / Self Care | Payer: Medicare Other | Source: Ambulatory Visit | Attending: Family Medicine | Admitting: Family Medicine

## 2011-12-15 DIAGNOSIS — Z1231 Encounter for screening mammogram for malignant neoplasm of breast: Secondary | ICD-10-CM | POA: Insufficient documentation

## 2012-01-15 ENCOUNTER — Other Ambulatory Visit: Payer: Self-pay | Admitting: Family Medicine

## 2012-01-17 NOTE — Telephone Encounter (Signed)
Refilled x 1 year per protocol

## 2012-01-18 ENCOUNTER — Encounter: Payer: Self-pay | Admitting: Family Medicine

## 2012-01-18 ENCOUNTER — Ambulatory Visit (INDEPENDENT_AMBULATORY_CARE_PROVIDER_SITE_OTHER): Payer: Medicare Other | Admitting: Family Medicine

## 2012-01-18 VITALS — BP 128/78 | HR 72 | Temp 97.8°F | Ht 58.5 in | Wt 110.0 lb

## 2012-01-18 DIAGNOSIS — R7989 Other specified abnormal findings of blood chemistry: Secondary | ICD-10-CM

## 2012-01-18 DIAGNOSIS — Z0000 Encounter for general adult medical examination without abnormal findings: Secondary | ICD-10-CM

## 2012-01-18 DIAGNOSIS — M810 Age-related osteoporosis without current pathological fracture: Secondary | ICD-10-CM

## 2012-01-18 NOTE — Progress Notes (Signed)
I have personally reviewed the Medicare Annual Wellness questionnaire and have noted  1. The patient's medical and social history  2. Their use of alcohol, tobacco or illicit drugs  3. Their current medications and supplements  4. The patient's functional ability including ADL's, fall risks, home safety risks and hearing or visual  impairment.  5. Diet and physical activities  6. Evidence for depression or mood disorders  The patients weight, height, BMI and visual acuity have been recorded in the chart  I have made referrals, counseling and provided education to the patient based review of the above and I have provided the pt with a written personalized care plan for preventive services.   08/2011 had cholecystectomy. Liver function tets back normal following this.  Hypertension: Well controlled on no medication.  Using medication without problems or lightheadedness:  Chest pain with exertion:None  Edema:None  Short of breath:None  Average home BPs: not checking at home Other issues: Sister age 79  diagnosed with CAD.Marland Kitchen Had CABG x 4.   Elevated Cholesterol: Well controlled previously on no medicaition.  Lab Results  Component Value Date   CHOL 180 10/28/2010   HDL 55.30 10/28/2010   LDLCALC 112* 10/28/2010   LDLDIRECT 135.2 06/06/2009   TRIG 63.0 10/28/2010   CHOLHDL 3 10/28/2010    Osteoporosis: On fosamax. Has been on this medication for 10 years.  Vit D def, resolved s/p supplementation with rx dosing. On twice daily 400 mg D3    Review of Systems  Constitutional: Negative for fever and fatigue.  HENT: Negative for ear pain.  Eyes: Negative for pain.  Respiratory: Negative for cough, shortness of breath and wheezing.  Cardiovascular: Negative for chest pain, palpitations and leg swelling.  Gastrointestinal: negative for constipation. Negative for nausea, abdominal pain, diarrhea and blood in stool.  Genitourinary: Negative for dysuria, vaginal bleeding and vaginal pain.    Skin: Negative for rash.  Neurological: Negative for dizziness, syncope and light-headedness.  Psychiatric/Behavioral: Negative for dysphoric mood. The patient is not nervous/anxious.    Objective:   Physical Exam  Constitutional: Vital signs are normal. She appears well-developed and well-nourished. She is cooperative. Non-toxic appearance. She does not appear ill. No distress.  HENT:  Head: Normocephalic.  Right Ear: Hearing, tympanic membrane, external ear and ear canal normal.  Left Ear: Hearing, tympanic membrane, external ear and ear canal normal.  Nose: Nose normal.  Eyes: Conjunctivae, EOM and lids are normal. Pupils are equal, round, and reactive to light. No foreign bodies found.  Neck: Trachea normal and normal range of motion. Neck supple. Carotid bruit is not present. No mass and no thyromegaly present.  Cardiovascular: Normal rate, regular rhythm, S1 normal, S2 normal, normal heart sounds and intact distal pulses. Exam reveals no gallop.  No murmur heard.  Pulmonary/Chest: Effort normal and breath sounds normal. No respiratory distress. She has no wheezes. She has no rhonchi. She has no rales.  Abdominal: Soft. Normal appearance and bowel sounds are normal. She exhibits no distension, no fluid wave, no abdominal bruit and no mass. There is no hepatosplenomegaly. There is no tenderness. There is no rebound, no guarding and no CVA tenderness. No hernia.  Genitourinary: No breast swelling, tenderness, discharge or bleeding. Pelvic exam was  Not performed. Lymphadenopathy:  She has no cervical adenopathy.  She has no axillary adenopathy.  Neurological: She is alert. She has normal strength. No cranial nerve deficit or sensory deficit.  Skin: Skin is warm, dry and intact. No rash noted.  Psychiatric: Her speech is normal and behavior is normal. Judgment normal. Her mood appears not anxious. Cognition and memory are normal. She does not exhibit a depressed mood.    Assessment &  Plan:   Annual medicare Wellness: The patient's preventative maintenance and recommended screening tests for an annual wellness exam were reviewed in full today.  Brought up to date unless services declined.  Counselled on the importance of diet, exercise, and its role in overall health and mortality.  The patient's FH and SH was reviewed, including their home life, tobacco status, and drug and alcohol status.   UPTD with Td and PNa, has had shingles vaccine.  Flu given at [facility] this fall. Last DXA: 2011 on fosamax for 10 years, will stop this year. Last colonoscopy: 2010, no repeat needed.  Mammogram 12/06/2011 nml  Will check lipids, vit D every other year. Reviewed labs from hospital from this summer, CMET cbc.

## 2012-01-18 NOTE — Patient Instructions (Addendum)
Stop fosamax given on for last 10 year. Stop at front desk on your way out to set up bone density. Review living will  And heath care power attorney with your family and set up.

## 2012-01-18 NOTE — Assessment & Plan Note (Signed)
Eval bone density this year for baseline on fosamax. Stop fosamax given risk of SE outweigh benefits.

## 2012-01-24 ENCOUNTER — Ambulatory Visit (HOSPITAL_COMMUNITY)
Admission: RE | Admit: 2012-01-24 | Discharge: 2012-01-24 | Disposition: A | Discharge status: Home / Self Care | Payer: Medicare Other | Source: Ambulatory Visit | Attending: Family Medicine | Admitting: Family Medicine

## 2012-01-24 DIAGNOSIS — Z780 Asymptomatic menopausal state: Secondary | ICD-10-CM | POA: Insufficient documentation

## 2012-01-24 DIAGNOSIS — Z13820 Encounter for screening for osteoporosis: Secondary | ICD-10-CM | POA: Insufficient documentation

## 2012-01-24 DIAGNOSIS — M810 Age-related osteoporosis without current pathological fracture: Secondary | ICD-10-CM

## 2012-01-24 DIAGNOSIS — M899 Disorder of bone, unspecified: Secondary | ICD-10-CM | POA: Insufficient documentation

## 2012-05-09 ENCOUNTER — Encounter: Payer: Self-pay | Admitting: Family Medicine

## 2012-05-09 ENCOUNTER — Ambulatory Visit (INDEPENDENT_AMBULATORY_CARE_PROVIDER_SITE_OTHER): Payer: Medicare Other | Admitting: Family Medicine

## 2012-05-09 VITALS — BP 120/84 | HR 90 | Temp 98.2°F | Ht <= 58 in | Wt 113.5 lb

## 2012-05-09 DIAGNOSIS — N63 Unspecified lump in unspecified breast: Secondary | ICD-10-CM

## 2012-05-09 DIAGNOSIS — N6320 Unspecified lump in the left breast, unspecified quadrant: Secondary | ICD-10-CM | POA: Insufficient documentation

## 2012-05-09 NOTE — Patient Instructions (Signed)
Stop at front desk to set up mammogram. 

## 2012-05-09 NOTE — Progress Notes (Signed)
  Subjective:    Patient ID: Brianna Barnes, female    DOB: 07-Aug-1928, 79 y.o.   MRN: [id number]  HPI  [age]year old female with history of CAD, prediabetes, HTN presents with  New lesion in left  left upper breast x 3 weeks. No change in size in 3 weeks, no tenderness, no discharge, no redness  No injury. No night sweats. No unexpected weight loss. No fever. No SOB, No CP.  Recent viral cold, resolved now.  Nml mammogram in 12/2011  Daughter with uterine cancer.  Family history of breast cancer.   Review of Systems  Constitutional: Negative for fever and fatigue.  HENT: Negative for ear pain.   Eyes: Negative for pain.  Respiratory: Negative for chest tightness and shortness of breath.   Cardiovascular: Negative for chest pain, palpitations and leg swelling.  Gastrointestinal: Negative for abdominal pain.  Genitourinary: Negative for dysuria.       Objective:   Physical Exam  Constitutional: She appears well-developed and well-nourished.  Eyes: Conjunctivae are normal. Pupils are equal, round, and reactive to light.  Neck: Normal range of motion. Neck supple. No thyromegaly present.  Cardiovascular: Normal rate, regular rhythm and normal heart sounds.  Exam reveals no friction rub.   No murmur heard. Pulmonary/Chest: Effort normal and breath sounds normal.  Abdominal: Soft. Bowel sounds are normal.  Genitourinary: No breast swelling, tenderness, discharge or bleeding.  Mass mobile 1 cm x 0.5 cm, cyst like at 3 oclock in left breast          Assessment & Plan:

## 2012-05-09 NOTE — Assessment & Plan Note (Signed)
Most likely benign cyst, but given pt concern and anxiety level.. Will eval with repeat mammogram diagnostic and likely [place].

## 2012-05-18 ENCOUNTER — Ambulatory Visit
Admission: RE | Admit: 2012-05-18 | Discharge: 2012-05-18 | Disposition: A | Discharge status: Home / Self Care | Payer: Medicare Other | Source: Ambulatory Visit | Attending: Family Medicine | Admitting: Family Medicine

## 2012-06-07 ENCOUNTER — Ambulatory Visit: Payer: Self-pay | Admitting: Ophthalmology

## 2012-06-26 ENCOUNTER — Other Ambulatory Visit: Payer: Self-pay | Admitting: *Deleted

## 2012-06-26 MED ORDER — OMEPRAZOLE 20 MG PO CPDR: 20.0000 mg | DELAYED_RELEASE_CAPSULE | Freq: Every day | ORAL | Status: DC

## 2012-07-05 ENCOUNTER — Ambulatory Visit: Payer: Self-pay | Admitting: Ophthalmology

## 2012-07-18 ENCOUNTER — Ambulatory Visit (INDEPENDENT_AMBULATORY_CARE_PROVIDER_SITE_OTHER): Payer: Medicare Other | Admitting: Family Medicine

## 2012-07-18 ENCOUNTER — Encounter: Payer: Self-pay | Admitting: Family Medicine

## 2012-07-18 VITALS — BP 120/82 | HR 87 | Temp 98.1°F | Ht <= 58 in | Wt 113.0 lb

## 2012-07-18 DIAGNOSIS — E785 Hyperlipidemia, unspecified: Secondary | ICD-10-CM

## 2012-07-18 DIAGNOSIS — I10 Essential (primary) hypertension: Secondary | ICD-10-CM

## 2012-07-18 DIAGNOSIS — N63 Unspecified lump in unspecified breast: Secondary | ICD-10-CM

## 2012-07-18 DIAGNOSIS — R7303 Prediabetes: Secondary | ICD-10-CM

## 2012-07-18 DIAGNOSIS — N6320 Unspecified lump in the left breast, unspecified quadrant: Secondary | ICD-10-CM

## 2012-07-18 DIAGNOSIS — R7309 Other abnormal glucose: Secondary | ICD-10-CM

## 2012-07-18 NOTE — Assessment & Plan Note (Signed)
Diet controlled.  

## 2012-07-18 NOTE — Assessment & Plan Note (Signed)
ONe BP elevation 152/90 with nurse housecall... All others have been normal. Get Bp cuff for home and follow BPs.

## 2012-07-18 NOTE — Progress Notes (Signed)
79 year old female presents for 6 month follow up.  Hypertension: Well controlled on no medication.  Using medication without problems or lightheadedness:  Chest pain with exertion:None  Edema:None  Short of breath:None  Average home BPs: not checking at home  Other issues: Sister age 31 diagnosed with CAD.Marland Kitchen Had CABG x 4.   Elevated Cholesterol: Well controlled previously on no medicaition.  No longer checking given age.  Osteoporosis: On fosamax. Has been on this medication for 10 years.   Vit D def, resolved s/p supplementation with rx dosing. On twice daily 400 mg D3   At last OV left breast mass noted ... [place] and mammogram were unremarkable.. Repeat recommended in 12/2012.  Has not changed at all per pt. No pain.  Review of Systems  Constitutional: Negative for fever and fatigue.  HENT: Negative for ear pain.  Eyes: Negative for pain.  Respiratory: Negative for cough, shortness of breath and wheezing.  Cardiovascular: Negative for chest pain, palpitations and leg swelling.  Gastrointestinal: negative for constipation. Negative for nausea, abdominal pain, diarrhea and blood in stool.  Genitourinary: Negative for dysuria, vaginal bleeding and vaginal pain.  Skin: Negative for rash.  Neurological: Negative for dizziness, syncope and light-headedness.  Psychiatric/Behavioral: Negative for dysphoric mood. The patient is not nervous/anxious.  Objective:   Physical Exam  Constitutional: Vital signs are normal. She appears well-developed and well-nourished. She is cooperative. Non-toxic appearance. She does not appear ill. No distress.  HENT:  Head: Normocephalic.  Right Ear: Hearing, tympanic membrane, external ear and ear canal normal.  Left Ear: Hearing, tympanic membrane, external ear and ear canal normal.  Nose: Nose normal.  Eyes: Conjunctivae, EOM and lids are normal. Pupils are equal, round, and reactive to light. No foreign bodies found.  Neck: Trachea normal and normal  range of motion. Neck supple. Carotid bruit is not present. No mass and no thyromegaly present.  Cardiovascular: Normal rate, regular rhythm, S1 normal, S2 normal, normal heart sounds and intact distal pulses. Exam reveals no gallop.  No murmur heard.  Pulmonary/Chest: Effort normal and breath sounds normal. No respiratory distress. She has no wheezes. She has no rhonchi. She has no rales.  Abdominal: Soft. Normal appearance and bowel sounds are normal. She exhibits no distension, no fluid wave, no abdominal bruit and no mass. There is no hepatosplenomegaly. There is no tenderness. There is no rebound, no guarding and no CVA tenderness. No hernia.  Genitourinary: No breast swelling, tenderness, discharge or bleeding. Pelvic exam was Not performed. Lymphadenopathy:  She has no cervical adenopathy.  She has no axillary adenopathy.  Neurological: She is alert. She has normal strength. No cranial nerve deficit or sensory deficit.  Skin: Skin is warm, dry and intact. No rash noted.  Psychiatric: Her speech is normal and behavior is normal. Judgment normal. Her mood appears not anxious. Cognition and memory are normal. She does not exhibit a depressed mood.

## 2012-07-18 NOTE — Assessment & Plan Note (Signed)
Re-eval in 6 months. 

## 2012-07-18 NOTE — Assessment & Plan Note (Signed)
Continue to follow.

## 2012-07-18 NOTE — Patient Instructions (Addendum)
Follow up for annual wellness in 6 months with fasting labs prior.  Look into Tetanus cost... Call if interested. Look into setting up living will and advance directives.

## 2012-10-12 ENCOUNTER — Other Ambulatory Visit: Payer: Self-pay | Admitting: Family Medicine

## 2012-10-12 DIAGNOSIS — Z1231 Encounter for screening mammogram for malignant neoplasm of breast: Secondary | ICD-10-CM

## 2012-11-16 ENCOUNTER — Encounter: Payer: Self-pay | Admitting: Family Medicine

## 2012-11-16 ENCOUNTER — Ambulatory Visit (INDEPENDENT_AMBULATORY_CARE_PROVIDER_SITE_OTHER): Payer: Medicare Other | Admitting: Family Medicine

## 2012-11-16 VITALS — BP 110/78 | HR 108 | Temp 98.5°F | Ht <= 58 in | Wt 109.5 lb

## 2012-11-16 DIAGNOSIS — E785 Hyperlipidemia, unspecified: Secondary | ICD-10-CM

## 2012-11-16 DIAGNOSIS — R51 Headache: Secondary | ICD-10-CM

## 2012-11-16 DIAGNOSIS — R17 Unspecified jaundice: Secondary | ICD-10-CM

## 2012-11-16 DIAGNOSIS — R5381 Other malaise: Secondary | ICD-10-CM

## 2012-11-16 DIAGNOSIS — R7989 Other specified abnormal findings of blood chemistry: Secondary | ICD-10-CM

## 2012-11-16 DIAGNOSIS — K8050 Calculus of bile duct without cholangitis or cholecystitis without obstruction: Secondary | ICD-10-CM

## 2012-11-16 DIAGNOSIS — R748 Abnormal levels of other serum enzymes: Secondary | ICD-10-CM

## 2012-11-16 DIAGNOSIS — G4700 Insomnia, unspecified: Secondary | ICD-10-CM

## 2012-11-16 DIAGNOSIS — K7460 Unspecified cirrhosis of liver: Secondary | ICD-10-CM

## 2012-11-16 LAB — BASIC METABOLIC PANEL
Calcium: 8.7 mg/dL (ref 8.4–10.5)
GFR: 114.9 mL/min (ref >60.00)
Glucose, Bld: 88 mg/dL (ref 70–99)
Potassium: 4 mEq/L (ref 3.5–5.1)
Sodium: 135 mEq/L (ref 135–145)

## 2012-11-16 LAB — CBC WITH DIFFERENTIAL/PLATELET
Basophils Absolute: 0 10*3/uL (ref 0.0–0.1)
Eosinophils Relative: 1.3 % (ref 0.0–5.0)
HCT: 33.8 % — ABNORMAL LOW (ref 36.0–46.0)
Hemoglobin: 11.3 g/dL — ABNORMAL LOW (ref 12.0–15.0)
Lymphocytes Relative: 8.8 % — ABNORMAL LOW (ref 12.0–46.0)
Monocytes Relative: 10.8 % (ref 3.0–12.0)
Neutro Abs: 6.8 10*3/uL (ref 1.4–7.7)
Platelets: 215 10*3/uL (ref 150.0–400.0)
RDW: 15.9 % — ABNORMAL HIGH (ref 11.5–14.6)
WBC: 8.7 10*3/uL (ref 4.5–10.5)

## 2012-11-16 LAB — HEPATIC FUNCTION PANEL
ALT: 71 U/L — ABNORMAL HIGH (ref 0–35)
AST: 77 U/L — ABNORMAL HIGH (ref 0–37)
Alkaline Phosphatase: 306 U/L — ABNORMAL HIGH (ref 39–117)
Total Bilirubin: 1.5 mg/dL — ABNORMAL HIGH (ref 0.3–1.2)

## 2012-11-16 MED ORDER — AMITRIPTYLINE HCL 25 MG PO TABS: 25.0000 mg | ORAL_TABLET | Freq: Every day | ORAL | Status: DC

## 2012-11-16 NOTE — Progress Notes (Addendum)
Nature conservation officer at [facility] [address] [place] [address] Phone: [phone] Fax: [phone]  Date:  11/16/2012   Name:  Brianna Barnes   DOB:  [birth year]   MRN:  [id number] Gender: female Age: 79 y.o.  Primary Physician:  Kerby Nora, MD  Evaluating MD: Hannah Beat, MD   Chief Complaint: Trouble Sleeping and Headache   History of Present Illness:  Brianna Barnes is a 79 y.o. pleasant patient who presents with the following:  The patient presents and overall has not felt particularly well over the last 2 weeks. She has had a URI type syndrome over the last 7-10 days and had some runny nose and cough with production of mucus. She additionally has had some trouble sleeping and she is felt tired and overall not all that well.  She has had some headaches additionally, but she has had a long-standing history of having some headaches and has her problems with light and sound, and these headaches are improved with lying down in a dark. She's never been given a formal diagnosis of migraine.  History is also significant for a history of cholecystectomy last year approximately one year ago.  Has not felt all that good for a couple of weeks. Was spitting up some mucous. Lasted for about a week. Has had some bites as well. Not sleeping well. Has trouble with light and sound, first one on tues in a long time.   *RADIOLOGY REPORT*   Clinical Data:   Lap cholecystectomy.  View cystic and common duct.   INTRAOPERATIVE CHOLANGIOGRAM   Technique:  Cholangiographic images from the C-arm fluoroscopic device were submitted for interpretation post-operatively.  Please see the procedural report for the amount of contrast and the fluoroscopy time utilized.   Comparison:  Ultrasound abdomen dated 08/25/2011.   Findings:  Cholecystectomy clips.   Very mild intrahepatic ductal dilatation.   Common duct is mildly dilated and irregular distally near its insertion at the  ampulla.   A few small mobile filling defects are present in the distal common bile duct, at least one of which deforms as contrast passes through into the proximal small bowel.  While indeterminate, this appearance suggests air bubbles rather than true common duct stones.   IMPRESSION: Status post cholecystectomy.   Mild intrahepatic and extrahepatic ductal dilatation.   A few mobile filling defects in the distal common bile duct, favored to reflect gas bubbles rather than true stones.   Contrast passes successfully into the proximal small bowel.   Original Report Authenticated By: Brianna Barnes, M.D.   Patient Active Problem List   Diagnosis Date Noted  . Left breast mass 05/09/2012  . Prediabetes 10/29/2010  . Family history of early CAD 10/29/2010  . UNSPECIFIED VITAMIN D DEFICIENCY 06/24/2009  . ESSENTIAL HYPERTENSION, BENIGN 06/13/2009  . HYPERLIPIDEMIA 06/06/2009  . ANEMIA-IRON DEFICIENCY 09/17/2008  . GERD 09/17/2008  . PEPTIC ULCER DISEASE 09/17/2008  . UTEROVAGINAL PROLAPSE, INCOMPLETE 09/17/2008  . OSTEOARTHRITIS 09/17/2008  . OSTEOPOROSIS 09/17/2008  . ARTERIOVENOUS MALFORMATION 09/17/2008  . POSITIVE TB SKIN TEST, WITHOUT TUBERCULOSIS 09/17/2008  . COLONIC POLYPS, BENIGN, HX OF 09/17/2008    Past Medical History  Diagnosis Date  . Anemia   . Osteoarthritis     hands, knees, and low back  . GERD (gastroesophageal reflux disease)   . Ulcer     peptic  . Osteoporosis   . Gallstone     Past Surgical History  Procedure Laterality Date  . External  ear surgery  1980    left side  . Spine surgery  1990    lumbar, ruptured disc  . Knee surgery  2004    arthroscopic  . Appendectomy    . Cholecystectomy  08/25/2011    Procedure: LAPAROSCOPIC CHOLECYSTECTOMY WITH INTRAOPERATIVE CHOLANGIOGRAM;  Surgeon: Liz Malady, MD;  Location: [facility] OR;  Service: General;  Laterality: N/A;    History   Social History  . Marital Status: Widowed    Spouse  Name: N/A    Number of Children: 3  . Years of Education: N/A   Occupational History  . Retired, [employer]    Social History Main Topics  . Smoking status: Never Smoker   . Smokeless tobacco: Never Used  . Alcohol Use: No  . Drug Use: No  . Sexual Activity: Not on file   Other Topics Concern  . Not on file   Social History Narrative   Regular exercise, walk 15-20 min 3 times a week..  Diet: fruits and veggies.    Looking into setting up living will.  Info given.     Family History  Problem Relation Age of Onset  . Heart disease Mother   . Heart disease Father 64  . Hyperlipidemia Father   . Diabetes Father   . Cirrhosis Sister   . Diabetes Brother   . Heart disease Brother     No Known Allergies  Medication list has been reviewed and updated.  Outpatient Prescriptions Prior to Visit  Medication Sig Dispense Refill  . acetaminophen (TYLENOL) 325 MG tablet Take 325 mg by mouth every 6 (six) hours as needed. For pain      . aspirin EC 81 MG tablet Take 81 mg by mouth daily.      . calcium-vitamin D (OSCAL WITH D) 500-200 MG-UNIT per tablet Take 1 tablet by mouth daily.      . Cholecalciferol (VITAMIN D3) 400 UNITS CAPS Take 400 mg by mouth 2 (two) times daily.        Marland Kitchen omeprazole (PRILOSEC) 20 MG capsule Take 1 capsule (20 mg total) by mouth daily.  90 capsule  1  . vitamin E 400 UNIT capsule Take 400 Units by mouth daily.       No facility-administered medications prior to visit.    Review of Systems:  The patient denies chest pain, abdominal pain. Poor appetite. Urinating normal. Normal bowel movement. Scattered intermittent cough. Otherwise review of systems, 10 point is negative.  Physical Examination: BP 110/78  Pulse 108  Temp(Src) 98.5 F (36.9 C) (Oral)  Ht 4\' 10"  (1.473 m)  Wt 109 lb 8 oz (49.669 kg)  BMI 22.89 kg/m2  Ideal Body Weight: Weight in (lb) to have BMI = 25: 119.4  GEN: WDWN, NAD, Non-toxic, A & O x 3 HEENT: Atraumatic, Normocephalic.  Neck supple. No masses, No LAD. Ears and Nose: No external deformity. CV: RRR, No M/G/R. No JVD. No thrill. No extra heart sounds. PULM: CTA B, no wheezes, crackles, rhonchi. No retractions. No resp. distress. No accessory muscle use. ABD: S, NT, ND, +BS. No rebound. No HSM. EXTR: No c/c/e NEURO Normal gait.  PSYCH: Normally interactive. Conversant. Not depressed or anxious appearing.  Calm demeanor.    Assessment and Plan:  Elevated bilirubin  Other malaise and fatigue - Plan: Basic metabolic panel, CBC with Differential, Hepatic function panel, TSH  Elevated alkaline phosphatase level  Elevated liver function tests  Insomnia  Headache(784.0)  >40 minutes spent in face  to face time with patient, >50% spent in counselling or coordination of care: spent in review of prior notes, films, operative notes, and overall coordination of care. Discussed case with 2 other MD's.   At the time of this dictation, all laboratories have returned which are more concerning. Elevated direct and indirect bilirubin. Markedly elevated alkaline phosphatase. Elevated AST and ALT. Status post cholecystectomy. Cannot rule out choledocholithiasis. Cannot rule out potential mass effect on common bile duct. Cannot exclude pancreatic neoplasm. Obtain a CT of the abdomen and pelvis with and without contrast to evaluate.  Additionally, the patient is anemic. Obtain ferritin, iron panel, B12, and folate.  Depending on results of studies, additional imaging or procedures will likely be needed to differentiate causes particularly with bilirubin and liver.  Carbon copy Dr. Ermalene Searing. She and I will have to work together to help ensure that this patient has full and complete workup.  Addendum: Given CT results, will consult GI. Cirrhosis of unknown cause and probable CBD stone seen on CT with diffusely elevated liver studies, bilirubin, alk phos, and GGT.  Ct Abdomen Pelvis W Wo Contrast  11/20/2012   CLINICAL DATA:   Elevated LFTs, bili Rubin, alkaline phosphatase. For left pancreatic or hepatic neoplasm. Prior cholecystectomy.  EXAM: CT ABDOMEN AND PELVIS WITHOUT AND WITH CONTRAST  TECHNIQUE: Multidetector CT imaging of the abdomen and pelvis was performed without contrast material in one or both body regions, followed by contrast material(s) and further sections in one or both body regions.  CONTRAST:  OMNIPAQUE IOHEXOL 350 MG/ML SOLN  COMPARISON:  08/25/2011 ultrasound.  FINDINGS: Mild cardiomegaly. Lung bases are clear. No effusions.  Small hiatal hernia. There are prominent vessels adjacent to the distal esophagus compatible with esophageal varices. There is enlargement of the left hepatic lobe and atrophy of the right hepatic lobe with mildly nodular contours compatible with cirrhosis. Small hypodensities are noted in the dome of the liver, most likely small cysts. The largest measures 10 mm on image 7 of series 6.  Prior cholecystectomy. Intrahepatic and extrahepatic biliary ductal dilatation. The common bile duct measures up to 11 mm. Somewhat high density material noted within the distal common bile duct. Cannot exclude distal CBD stones. 6 mm rounded higher density area noted within the distal CBD on image 21 of series 7.  No pancreatic abnormality. Spleen is enlarged. Adrenals and kidneys are unremarkable.  Aorta and iliac vessels are calcified, non aneurysmal. There is recanalization of the umbilical vein with prominent varices in the umbilicus.  Uterus, adnexae and urinary bladder are unremarkable. Pessary device noted within the vagina. There are scattered sigmoid diverticula. No active diverticulitis. Small bowel is decompressed.  No acute bony abnormality.  IMPRESSION: Changes of cirrhosis with associated splenomegaly, esophageal varices and recanalization of the umbilical vein.  The common bile duct is mildly prominent post cholecystectomy. Higher density material is noted within the distal common bile  duct. Cannot exclude distal CBD stone. Consider further evaluation with MRCP or ERCP.   Electronically Signed   By: Charlett Nose M.D.   On: 11/20/2012 16:09    Results for orders placed in visit on 11/16/12  BASIC METABOLIC PANEL      Result Value Range   Sodium 135  135 - 145 mEq/L   Potassium 4.0  3.5 - 5.1 mEq/L   Chloride 102  96 - 112 mEq/L   CO2 27  19 - 32 mEq/L   Glucose, Bld 88  70 - 99 mg/dL   BUN  9  6 - 23 mg/dL   Creatinine, Ser 0.5  0.4 - 1.2 mg/dL   Calcium 8.7  8.4 - 16.1 mg/dL   GFR 096.04  >54.09 mL/min  CBC WITH DIFFERENTIAL      Result Value Range   WBC 8.7  4.5 - 10.5 K/uL   RBC 4.08  3.87 - 5.11 Mil/uL   Hemoglobin 11.3 (*) 12.0 - 15.0 g/dL   HCT 81.1 (*) 91.4 - 78.2 %   MCV 83.0  78.0 - 100.0 fl   MCHC 33.5  30.0 - 36.0 g/dL   RDW 95.6 (*) 21.3 - 08.6 %   Platelets 215.0  150.0 - 400.0 K/uL   Neutrophils Relative % 78.8 (*) 43.0 - 77.0 %   Lymphocytes Relative 8.8 (*) 12.0 - 46.0 %   Monocytes Relative 10.8  3.0 - 12.0 %   Eosinophils Relative 1.3  0.0 - 5.0 %   Basophils Relative 0.3  0.0 - 3.0 %   Neutro Abs 6.8  1.4 - 7.7 K/uL   Lymphs Abs 0.8  0.7 - 4.0 K/uL   Monocytes Absolute 0.9  0.1 - 1.0 K/uL   Eosinophils Absolute 0.1  0.0 - 0.7 K/uL   Basophils Absolute 0.0  0.0 - 0.1 K/uL  HEPATIC FUNCTION PANEL      Result Value Range   Total Bilirubin 1.5 (*) 0.3 - 1.2 mg/dL   Bilirubin, Direct 0.5 (*) 0.0 - 0.3 mg/dL   Alkaline Phosphatase 306 (*) 39 - 117 U/L   AST 77 (*) 0 - 37 U/L   ALT 71 (*) 0 - 35 U/L   Total Protein 6.4  6.0 - 8.3 g/dL   Albumin 3.4 (*) 3.5 - 5.2 g/dL  TSH      Result Value Range   TSH 0.79  0.35 - 5.50 uIU/mL     Orders Today:  Orders Placed This Encounter  Procedures  . Basic metabolic panel  . CBC with Differential  . Hepatic function panel  . TSH    Updated Medication List: (Includes new medications, updates to list, dose adjustments) Meds ordered this encounter  Medications  . amitriptyline (ELAVIL) 25 MG  tablet    Sig: Take 1 tablet (25 mg total) by mouth at bedtime.    Dispense:  30 tablet    Refill:  3    Medications Discontinued: There are no discontinued medications.    Signed, Elpidio Galea. Caitrin Pendergraph, MD 11/16/2012 10:31 AM

## 2012-11-17 ENCOUNTER — Ambulatory Visit: Payer: Medicare Other

## 2012-11-17 DIAGNOSIS — R748 Abnormal levels of other serum enzymes: Secondary | ICD-10-CM

## 2012-11-17 DIAGNOSIS — D509 Iron deficiency anemia, unspecified: Secondary | ICD-10-CM

## 2012-11-17 LAB — LIPASE: Lipase: 19 U/L (ref 11.0–59.0)

## 2012-11-17 LAB — IBC PANEL: Transferrin: 279.1 mg/dL (ref 212.0–360.0)

## 2012-11-17 LAB — FOLATE: Folate: 11.7 ng/mL (ref >5.9)

## 2012-11-20 ENCOUNTER — Ambulatory Visit (INDEPENDENT_AMBULATORY_CARE_PROVIDER_SITE_OTHER)
Admission: RE | Admit: 2012-11-20 | Discharge: 2012-11-20 | Disposition: A | Discharge status: Home / Self Care | Payer: Medicare Other | Source: Ambulatory Visit | Attending: Family Medicine | Admitting: Family Medicine

## 2012-11-20 DIAGNOSIS — R7989 Other specified abnormal findings of blood chemistry: Secondary | ICD-10-CM

## 2012-11-20 DIAGNOSIS — R748 Abnormal levels of other serum enzymes: Secondary | ICD-10-CM

## 2012-11-20 DIAGNOSIS — R17 Unspecified jaundice: Secondary | ICD-10-CM

## 2012-11-20 DIAGNOSIS — R5381 Other malaise: Secondary | ICD-10-CM

## 2012-11-20 MED ORDER — IOHEXOL 350 MG/ML SOLN
100.0000 mL | Freq: Once | INTRAVENOUS | Status: AC | PRN
  Administered 2012-11-20: 100 mL via INTRAVENOUS

## 2012-11-20 NOTE — Addendum Note (Signed)
Addended by: Hannah Beat on: 11/20/2012 06:06 PM   Modules accepted: Orders

## 2012-11-22 ENCOUNTER — Ambulatory Visit (INDEPENDENT_AMBULATORY_CARE_PROVIDER_SITE_OTHER): Payer: Medicare Other | Admitting: Internal Medicine

## 2012-11-22 ENCOUNTER — Encounter: Payer: Self-pay | Admitting: Internal Medicine

## 2012-11-22 ENCOUNTER — Other Ambulatory Visit (INDEPENDENT_AMBULATORY_CARE_PROVIDER_SITE_OTHER): Payer: Medicare Other

## 2012-11-22 VITALS — BP 118/68 | HR 95 | Ht 59.0 in | Wt 108.0 lb

## 2012-11-22 DIAGNOSIS — K766 Portal hypertension: Secondary | ICD-10-CM

## 2012-11-22 DIAGNOSIS — K7460 Unspecified cirrhosis of liver: Secondary | ICD-10-CM

## 2012-11-22 DIAGNOSIS — R7989 Other specified abnormal findings of blood chemistry: Secondary | ICD-10-CM

## 2012-11-22 DIAGNOSIS — Z86010 Personal history of colonic polyps: Secondary | ICD-10-CM

## 2012-11-22 DIAGNOSIS — K7469 Other cirrhosis of liver: Secondary | ICD-10-CM | POA: Insufficient documentation

## 2012-11-22 DIAGNOSIS — R933 Abnormal findings on diagnostic imaging of other parts of digestive tract: Secondary | ICD-10-CM

## 2012-11-22 LAB — COMPREHENSIVE METABOLIC PANEL
ALT: 52 U/L — ABNORMAL HIGH (ref 0–35)
AST: 54 U/L — ABNORMAL HIGH (ref 0–37)
Alkaline Phosphatase: 431 U/L — ABNORMAL HIGH (ref 39–117)
Creatinine, Ser: 0.6 mg/dL (ref 0.4–1.2)
Sodium: 136 mEq/L (ref 135–145)
Total Bilirubin: 1 mg/dL (ref 0.3–1.2)

## 2012-11-22 LAB — CBC
MCHC: 33 g/dL (ref 30.0–36.0)
MCV: 82.4 fl (ref 78.0–100.0)

## 2012-11-22 LAB — PROTIME-INR
INR: 1.1 ratio — ABNORMAL HIGH (ref 0.8–1.0)
Prothrombin Time: 11.7 s (ref 10.2–12.4)

## 2012-11-22 NOTE — Progress Notes (Signed)
Patient ID: Brianna Barnes, female   DOB: 12-Apr-1928, 79 y.o.   MRN: [id number] HPI: [name] is an 79 yo female with PMH of osteoarthritis, GERD with peptic ulcer disease, osteoporosis, gallstones status post cholecystectomy in summer 2013, anemia, and new diagnosis of cirrhosis who is seen in consultation at the request of Dr. Patsy Lager for evaluation of liver disease.  She is here today with her daughter.  The patient reports being told that she may have liver disease and that she needed to meet with a GI doctor.  She does not recall ever being told that her liver was abnormal or her liver tests elevated. She denies a history of jaundice, ascites, lower extremity edema, GI bleeding or confusion. She has noted over the last several months fatigue, decreased energy level, decreased appetite and low-grade fever. She denies nausea or vomiting. She does have a history of heartburn but this has been well controlled with omeprazole 20 mg daily. She reports feeling full fairly quickly with eating. She reports a history of prior blood transfusion prior to 1990. No tattoos. No history of alcohol use.  Interestingly she does have 2 siblings with chronic liver disease/cirrhosis. This is from unclear cause though she does recall both of them having issues with hepatic encephalopathy ("ammonia")  She had her gallbladder removed in June of 2013. She does not recall being told of any cirrhosis or liver scarring at that time  In the past several months she has been started on iron replacement over-the-counter and also amitriptyline for sleep. No other new medications.  Past Medical History  Diagnosis Date  . Anemia   . Osteoarthritis     hands, knees, and low back  . GERD (gastroesophageal reflux disease)   . Ulcer     peptic  . Osteoporosis   . Gallstone     Past Surgical History  Procedure Laterality Date  . External ear surgery  1980    left side  . Spine surgery  1990    lumbar, ruptured disc  .  Knee surgery  2004    arthroscopic  . Appendectomy    . Cholecystectomy  08/25/2011    Procedure: LAPAROSCOPIC CHOLECYSTECTOMY WITH INTRAOPERATIVE CHOLANGIOGRAM;  Surgeon: Liz Malady, MD;  Location: [facility] OR;  Service: General;  Laterality: N/A;    Current Outpatient Prescriptions  Medication Sig Dispense Refill  . acetaminophen (TYLENOL) 325 MG tablet Take 325 mg by mouth every 6 (six) hours as needed. For pain      . amitriptyline (ELAVIL) 25 MG tablet Take 1 tablet (25 mg total) by mouth at bedtime.  30 tablet  3  . aspirin EC 81 MG tablet Take 81 mg by mouth daily.      . calcium-vitamin D (OSCAL WITH D) 500-200 MG-UNIT per tablet Take 1 tablet by mouth daily.      . Cholecalciferol (VITAMIN D3) 400 UNITS CAPS Take 400 mg by mouth 2 (two) times daily.        Marland Kitchen omeprazole (PRILOSEC) 20 MG capsule Take 1 capsule (20 mg total) by mouth daily.  90 capsule  1  . vitamin E 400 UNIT capsule Take 400 Units by mouth daily.       No current facility-administered medications for this visit.    No Known Allergies  Family History  Problem Relation Age of Onset  . Heart disease Mother   . Heart disease Father 71  . Hyperlipidemia Father   . Diabetes Father   . Cirrhosis Sister   .  Diabetes Brother   . Heart disease Brother     History  Substance Use Topics  . Smoking status: Never Smoker   . Smokeless tobacco: Never Used  . Alcohol Use: No    ROS: As per history of present illness, otherwise negative  BP 118/68  Pulse 95  Ht 4\' 11"  (1.499 m)  Wt 108 lb (48.988 kg)  BMI 21.8 kg/m2 Constitutional: Well-developed and well-nourished. No distress. HEENT: Normocephalic and atraumatic. Oropharynx is clear and moist. No oropharyngeal exudate. Conjunctivae are somewhat pale.  No scleral icterus. Neck: Neck supple. Trachea midline. Cardiovascular: Normal rate, regular rhythm and intact distal pulses.  Pulmonary/chest: Effort normal and breath sounds normal. No wheezing, rales or  rhonchi. Abdominal: Soft, nontender, nondistended. Bowel sounds active throughout. There are no masses palpable.  Extremities: no clubbing, cyanosis, or edema Lymphadenopathy: No cervical adenopathy noted. Neurological: Alert and oriented to person place and time. No asterixis Skin: Skin is warm and dry. No rashes noted. Psychiatric: Normal mood and affect. Behavior is normal.  RELEVANT LABS AND IMAGING: CBC    Component Value Date/Time   WBC 8.7 11/16/2012 1049   RBC 4.08 11/16/2012 1049   HGB 11.3* 11/16/2012 1049   HCT 33.8* 11/16/2012 1049   PLT 215.0 11/16/2012 1049   MCV 83.0 11/16/2012 1049   MCH 30.8 08/26/2011 0635   MCHC 33.5 11/16/2012 1049   RDW 15.9* 11/16/2012 1049   LYMPHSABS 0.8 11/16/2012 1049   MONOABS 0.9 11/16/2012 1049   EOSABS 0.1 11/16/2012 1049   BASOSABS 0.0 11/16/2012 1049    CMP     Component Value Date/Time   NA 135 11/16/2012 1049   K 4.0 11/16/2012 1049   CL 102 11/16/2012 1049   CO2 27 11/16/2012 1049   GLUCOSE 88 11/16/2012 1049   BUN 9 11/16/2012 1049   CREATININE 0.5 11/16/2012 1049   CALCIUM 8.7 11/16/2012 1049   PROT 6.4 11/16/2012 1049   ALBUMIN 3.4* 11/16/2012 1049   AST 77* 11/16/2012 1049   ALT 71* 11/16/2012 1049   ALKPHOS 306* 11/16/2012 1049   BILITOT 1.5* 11/16/2012 1049   GFRNONAA 85* 08/26/2011 0635   GFRAA >90 08/26/2011 0635   GGT - 313  TSH normal  Iron/TIBC/Ferritin    Component Value Date/Time   IRON 30* 11/17/2012 1135   FERRITIN 34.1 11/17/2012 1135    ___________________________________________________________________________________________________ CLINICAL DATA:  Elevated LFTs, bili Rubin, alkaline phosphatase. For left pancreatic or hepatic neoplasm. Prior cholecystectomy.   EXAM: CT ABDOMEN AND PELVIS WITHOUT AND WITH CONTRAST -- Sept 2014   TECHNIQUE: Multidetector CT imaging of the abdomen and pelvis was performed without contrast material in one or both body regions, followed by contrast material(s) and further sections in one or both  body regions.   CONTRAST:  OMNIPAQUE IOHEXOL 350 MG/ML SOLN   COMPARISON:  08/25/2011 ultrasound.   FINDINGS: Mild cardiomegaly. Lung bases are clear. No effusions.   Small hiatal hernia. There are prominent vessels adjacent to the distal esophagus compatible with esophageal varices. There is enlargement of the left hepatic lobe and atrophy of the right hepatic lobe with mildly nodular contours compatible with cirrhosis. Small hypodensities are noted in the dome of the liver, most likely small cysts. The largest measures 10 mm on image 7 of series 6.   Prior cholecystectomy. Intrahepatic and extrahepatic biliary ductal dilatation. The common bile duct measures up to 11 mm. Somewhat high density material noted within the distal common bile duct. Cannot exclude distal CBD stones. 6  mm rounded higher density area noted within the distal CBD on image 21 of series 7.   No pancreatic abnormality. Spleen is enlarged. Adrenals and kidneys are unremarkable.   Aorta and iliac vessels are calcified, non aneurysmal. There is recanalization of the umbilical vein with prominent varices in the umbilicus.   Uterus, adnexae and urinary bladder are unremarkable. Pessary device noted within the vagina. There are scattered sigmoid diverticula. No active diverticulitis. Small bowel is decompressed.   No acute bony abnormality.   IMPRESSION: Changes of cirrhosis with associated splenomegaly, esophageal varices and recanalization of the umbilical vein.   The common bile duct is mildly prominent post cholecystectomy. Higher density material is noted within the distal common bile duct. Cannot exclude distal CBD stone. Consider further evaluation with MRCP or ERCP. ___________________________________________________________________________________  INTRAOPERATIVE CHOLANGIOGRAM -- June 2013   Technique:  Cholangiographic images from the C-arm fluoroscopic device were submitted for  interpretation post-operatively.  Please see the procedural report for the amount of contrast and the fluoroscopy time utilized.   Comparison:  Ultrasound abdomen dated 08/25/2011.   Findings:  Cholecystectomy clips.   Very mild intrahepatic ductal dilatation.   Common duct is mildly dilated and irregular distally near its insertion at the ampulla.   A few small mobile filling defects are present in the distal common bile duct, at least one of which deforms as contrast passes through into the proximal small bowel.  While indeterminate, this appearance suggests air bubbles rather than true common duct stones.   IMPRESSION: Status post cholecystectomy.   Mild intrahepatic and extrahepatic ductal dilatation.   A few mobile filling defects in the distal common bile duct, favored to reflect gas bubbles rather than true stones.   Contrast passes successfully into the proximal small bowel. __________________________________________________________________________________  COMPLETE ABDOMINAL ULTRASOUND - June 2013   Comparison:  05/01/2011 ultrasound   Findings:   Gallbladder:  Distended, containing sludge and/or no shadowing stones.  Echogenic focus along the wall non dependently measures 4 mm.  No appreciable associated vascularity.  May represent to effective sludge or a polyp.  Wall thickness at 3 mm is upper normal.  Negative sonographic Murphy's sign.   Common bile duct:  Prominent at 1 cm.  The distal duct is obscured by overlying bowel gas artifact.   Liver:  Poorly visualized due to artifact/overlying bowel.   IVC:  Appears normal.   Pancreas:  Poorly visualized due to overlying bowel gas artifact.   Spleen:  Splenomegaly is again noted, measuring up to 13 cm in length.   Right Kidney:  Measures 10.2 cm.  No hydronephrosis or focal abnormality.   Left Kidney:  Measures 10.1 cm.  No hydronephrosis or focal abnormality.   Abdominal aorta:  Scattered  atherosclerotic calcification. Portions of the aorta are obscured by overlying bowel gas artifact. Where seen, measures up to 2 cm in diameter.   IMPRESSION: Distended gallbladder containing sludge and/or nonshadowing stones. Wall thickness at upper normal limits.  Negative sonographic Murphy's sign.  If clinical concern persists, recommend HIDA scan follow up.   4 mm tumefactive sludge versus polyp along the gallbladder wall non dependently.   Prominent common bile duct at 1 cm.  Distal duct is obscured by overlying bowel gas artifact. Choledocholithiasis is not excluded. If warranted, follow-up with ERCP or MRCP.   Splenomegaly.  Prior endoscopies Colonoscopy 07/09/2008, Dr. Sabino Gasser -- polyps of the transverse colon, descending colon and rectosigmoid colon. Pathology = tubular adenoma x2, hyperplastic polyp EGD and colonoscopy 03/21/2006,  Dr. Sabino Gasser -- 4 small AVMs seen in the duodenal bulb, question of Barrett's esophagus biopsied. Colonoscopy report not available the pathology report available. Pathology = distal esophagus = GE with mild inflammation no metaplasia dysplasia or malignancy.  Colon polyps 2 adenomatous polyps. 2 hyperplastic polyps.    ASSESSMENT/PLAN: 79 yo female with PMH of osteoarthritis, GERD with peptic ulcer disease, osteoporosis, gallstones status post cholecystectomy in summer 2013, anemia, and new diagnosis of cirrhosis who is seen in consultation at the request of Dr. Patsy Lager for evaluation of liver disease.  1.  Cryptogenic Cirrhosis -- the patient has imaging consistent with cirrhosis with portal hypertension as manifested by intra-abdominal varices. I do not know what her INR is, but it is pending.  There is evidence of splenomegaly but not thrombocytopenia. Her disease is very well compensated at this point. She also has a very compelling family history of cirrhosis.  We spoke at length today about cirrhosis and chronic liver disease including  causes, manifestations, and natural history. I have recommended additional lab test to evaluate for other causes of cirrhosis, especially hereditary once given her family history. I would also like to rule out viral hepatitis given her history of remote blood transfusion, though this is felt less likely.  ANA, IgG, AMSA, AMA, ceruloplasmin, alpha-1 antitrypsin, viral serologies.  I have also recommended an upper endoscopy to screen for esophageal varices, but will hold on this for now until we exclude common bile duct stones, see #2. I will see her back for ongoing cirrhosis management once we sort out the possibility of retained bile duct stone leading to low grade obstruction. --May need vaccination, depending on viral serologies --HCC screening up to date with recent CT --Esophageal varices screening -- to be done see above  2.  Hx of gallstones/possible choledocholithiasis -- her LFTs are elevated, and there is somewhat of a mixed picture. At this time they appear to be cholestatic predominant.  Her common bile duct is enlarged, which could be due to post cholecystectomy state, but the CT also raises questions of possible retained stone.  I have reviewed her intraoperative cholangiogram from last summer, and there was distal duct defects which was felt to be gas/air.  I have recommended MRCP for further evaluation of common bile duct stone. If stones or sludge are present, ERCP would be recommended. We did discuss ERCP at length today, but I would like to proceed with MRCP first given the higher risks associated with ERCP.    3.  Hx of adenomatous colon polyps -- last colonoscopy 2010. Based on her age and other ongoing issues as described above screening colonoscopy is not recommended at this time  Time was provided for questions and answers today. I will see her back after imaging and laboratory tests. She is asked to call if she has any questions or concerns prior to followup

## 2012-11-22 NOTE — Patient Instructions (Addendum)
Your physician has requested that you go to the basement for lab work before leaving today.  You have been scheduled for an MRI/MRCP at [facility]  on 11/28/2012. Your appointment time is 8:00am. Please arrive 15 minutes prior to your appointment time for registration purposes. Nothing to eat or Drink after midnight. However, if you have any metal in your body, have a pacemaker or defibrillator, please be sure to let your ordering physician know. This test typically takes 45 minutes to 1 hour to complete.                                               We are excited to introduce MyChart, a new best-in-class service that provides you online access to important information in your electronic medical record. We want to make it easier for you to view your health information - all in one secure location - when and where you need it. We expect MyChart will enhance the quality of care and service we provide.  When you register for MyChart, you can:    View your test results.    Request appointments and receive appointment reminders via email.    Request medication renewals.    View your medical history, allergies, medications and immunizations.    Communicate with your physician's office through a password-protected site.    Conveniently print information such as your medication lists.  To find out if MyChart is right for you, please talk to a member of our clinical staff today. We will gladly answer your questions about this free health and wellness tool.  If you are age 49 or older and want a member of your family to have access to your record, you must provide written consent by completing a proxy form available at our office. Please speak to our clinical staff about guidelines regarding accounts for patients younger than age 59.  As you activate your MyChart account and need any technical assistance, please call the MyChart technical support line at [phone]-[phone]) or email your  question to [email or web address]. If you email your question(s), please include your name, a return phone number and the best time to reach you.  If you have non-urgent health-related questions, you can send a message to our office through MyChart at [email or web address]. If you have a medical emergency, call 911.  Thank you for using MyChart as your new health and wellness resource!   MyChart licensed from [employer],  [id number]. Patents Pending.

## 2012-11-23 LAB — HEPATITIS A ANTIBODY, TOTAL: Hep A Total Ab: POSITIVE — AB

## 2012-11-23 LAB — ANA: Anti Nuclear Antibody(ANA): NEGATIVE

## 2012-11-23 LAB — HEPATITIS B SURFACE ANTIBODY, QUANTITATIVE: Hepatitis B-Post: 0 m[IU]/mL

## 2012-11-23 LAB — HEPATITIS B SURFACE ANTIBODY,QUALITATIVE: Hep B S Ab: NEGATIVE

## 2012-11-24 LAB — ANTI-SMOOTH MUSCLE ANTIBODY, IGG: Smooth Muscle Ab: 12 U (ref <20)

## 2012-11-24 LAB — CERULOPLASMIN: Ceruloplasmin: 44 mg/dL (ref 20–60)

## 2012-11-24 LAB — ALPHA-1-ANTITRYPSIN: A-1 Antitrypsin, Ser: 282 mg/dL — ABNORMAL HIGH (ref 90–200)

## 2012-11-25 LAB — HEPATITIS B DNA, QUALITATIVE: Hepatitis B Virus DNA Qual: NOT DETECTED

## 2012-11-28 ENCOUNTER — Other Ambulatory Visit: Payer: Self-pay | Admitting: Internal Medicine

## 2012-11-28 ENCOUNTER — Other Ambulatory Visit: Payer: Self-pay

## 2012-11-28 ENCOUNTER — Encounter (HOSPITAL_COMMUNITY): Payer: Self-pay | Admitting: *Deleted

## 2012-11-28 ENCOUNTER — Ambulatory Visit (HOSPITAL_COMMUNITY)
Admission: RE | Admit: 2012-11-28 | Discharge: 2012-11-28 | Disposition: A | Discharge status: Home / Self Care | Payer: Medicare Other | Source: Ambulatory Visit | Attending: Internal Medicine | Admitting: Internal Medicine

## 2012-11-28 ENCOUNTER — Telehealth: Payer: Self-pay | Admitting: Internal Medicine

## 2012-11-28 ENCOUNTER — Encounter: Payer: Self-pay | Admitting: Internal Medicine

## 2012-11-28 DIAGNOSIS — K8050 Calculus of bile duct without cholangitis or cholecystitis without obstruction: Secondary | ICD-10-CM

## 2012-11-28 DIAGNOSIS — R161 Splenomegaly, not elsewhere classified: Secondary | ICD-10-CM | POA: Insufficient documentation

## 2012-11-28 DIAGNOSIS — K7689 Other specified diseases of liver: Secondary | ICD-10-CM | POA: Insufficient documentation

## 2012-11-28 DIAGNOSIS — K7460 Unspecified cirrhosis of liver: Secondary | ICD-10-CM

## 2012-11-28 DIAGNOSIS — R7989 Other specified abnormal findings of blood chemistry: Secondary | ICD-10-CM | POA: Insufficient documentation

## 2012-11-28 DIAGNOSIS — R945 Abnormal results of liver function studies: Secondary | ICD-10-CM | POA: Insufficient documentation

## 2012-11-28 DIAGNOSIS — K766 Portal hypertension: Secondary | ICD-10-CM | POA: Insufficient documentation

## 2012-11-28 DIAGNOSIS — I8500 Esophageal varices without bleeding: Secondary | ICD-10-CM | POA: Insufficient documentation

## 2012-11-28 MED ORDER — GADOBENATE DIMEGLUMINE 529 MG/ML IV SOLN
10.0000 mL | Freq: Once | INTRAVENOUS | Status: AC | PRN
  Administered 2012-11-28: 9 mL via INTRAVENOUS

## 2012-11-28 NOTE — Telephone Encounter (Signed)
Pt's daughter called and was given the instructions for her mothers procedure.  Her meds and allergies have been reviewed.  She will call with any questions or concerns

## 2012-11-29 ENCOUNTER — Encounter (HOSPITAL_COMMUNITY): Payer: Self-pay | Admitting: Pharmacy Technician

## 2012-11-30 ENCOUNTER — Encounter (HOSPITAL_COMMUNITY): Payer: Self-pay | Admitting: *Deleted

## 2012-11-30 ENCOUNTER — Ambulatory Visit (HOSPITAL_COMMUNITY)
Admission: RE | Admit: 2012-11-30 | Discharge: 2012-11-30 | Disposition: A | Discharge status: Home / Self Care | Payer: Medicare Other | Source: Ambulatory Visit | Attending: Gastroenterology | Admitting: Gastroenterology

## 2012-11-30 ENCOUNTER — Encounter (HOSPITAL_COMMUNITY)
Admission: RE | Disposition: A | Discharge status: Home / Self Care | Payer: Medicare Other | Source: Ambulatory Visit | Attending: Gastroenterology

## 2012-11-30 ENCOUNTER — Encounter (HOSPITAL_COMMUNITY): Payer: Self-pay | Admitting: Anesthesiology

## 2012-11-30 ENCOUNTER — Ambulatory Visit (HOSPITAL_COMMUNITY): Payer: Medicare Other

## 2012-11-30 ENCOUNTER — Ambulatory Visit (HOSPITAL_COMMUNITY): Payer: Medicare Other | Admitting: Anesthesiology

## 2012-11-30 DIAGNOSIS — K449 Diaphragmatic hernia without obstruction or gangrene: Secondary | ICD-10-CM | POA: Insufficient documentation

## 2012-11-30 DIAGNOSIS — K838 Other specified diseases of biliary tract: Secondary | ICD-10-CM | POA: Insufficient documentation

## 2012-11-30 DIAGNOSIS — Z79899 Other long term (current) drug therapy: Secondary | ICD-10-CM | POA: Insufficient documentation

## 2012-11-30 DIAGNOSIS — K5730 Diverticulosis of large intestine without perforation or abscess without bleeding: Secondary | ICD-10-CM | POA: Insufficient documentation

## 2012-11-30 DIAGNOSIS — M810 Age-related osteoporosis without current pathological fracture: Secondary | ICD-10-CM | POA: Insufficient documentation

## 2012-11-30 DIAGNOSIS — Z8711 Personal history of peptic ulcer disease: Secondary | ICD-10-CM | POA: Insufficient documentation

## 2012-11-30 DIAGNOSIS — Z7982 Long term (current) use of aspirin: Secondary | ICD-10-CM | POA: Insufficient documentation

## 2012-11-30 DIAGNOSIS — R161 Splenomegaly, not elsewhere classified: Secondary | ICD-10-CM | POA: Insufficient documentation

## 2012-11-30 DIAGNOSIS — K7460 Unspecified cirrhosis of liver: Secondary | ICD-10-CM | POA: Insufficient documentation

## 2012-11-30 DIAGNOSIS — D649 Anemia, unspecified: Secondary | ICD-10-CM | POA: Insufficient documentation

## 2012-11-30 DIAGNOSIS — K319 Disease of stomach and duodenum, unspecified: Secondary | ICD-10-CM | POA: Insufficient documentation

## 2012-11-30 DIAGNOSIS — K8050 Calculus of bile duct without cholangitis or cholecystitis without obstruction: Secondary | ICD-10-CM

## 2012-11-30 DIAGNOSIS — I517 Cardiomegaly: Secondary | ICD-10-CM | POA: Insufficient documentation

## 2012-11-30 DIAGNOSIS — Z9089 Acquired absence of other organs: Secondary | ICD-10-CM | POA: Insufficient documentation

## 2012-11-30 DIAGNOSIS — K7469 Other cirrhosis of liver: Secondary | ICD-10-CM

## 2012-11-30 DIAGNOSIS — K219 Gastro-esophageal reflux disease without esophagitis: Secondary | ICD-10-CM | POA: Insufficient documentation

## 2012-11-30 DIAGNOSIS — I8510 Secondary esophageal varices without bleeding: Secondary | ICD-10-CM | POA: Insufficient documentation

## 2012-11-30 HISTORY — PX: ENDOSCOPIC RETROGRADE CHOLANGIOPANCREATOGRAPHY (ERCP) WITH PROPOFOL: SHX5810

## 2012-11-30 SURGERY — ENDOSCOPIC RETROGRADE CHOLANGIOPANCREATOGRAPHY (ERCP) WITH PROPOFOL
Anesthesia: General

## 2012-11-30 MED ORDER — CIPROFLOXACIN IN D5W 400 MG/200ML IV SOLN
INTRAVENOUS | Status: DC | PRN
  Administered 2012-11-30: 400 mg via INTRAVENOUS

## 2012-11-30 MED ORDER — LIDOCAINE HCL (CARDIAC) 20 MG/ML IV SOLN
INTRAVENOUS | Status: DC | PRN
  Administered 2012-11-30: 50 mg via INTRAVENOUS

## 2012-11-30 MED ORDER — LACTATED RINGERS IV SOLN
INTRAVENOUS | Status: DC
  Administered 2012-11-30: 12:00:00 via INTRAVENOUS

## 2012-11-30 MED ORDER — ONDANSETRON HCL 4 MG/2ML IJ SOLN
INTRAMUSCULAR | Status: DC | PRN
  Administered 2012-11-30: 4 mg via INTRAVENOUS

## 2012-11-30 MED ORDER — GLYCOPYRROLATE 0.2 MG/ML IJ SOLN
INTRAMUSCULAR | Status: DC | PRN
  Administered 2012-11-30: 0.2 mg via INTRAVENOUS

## 2012-11-30 MED ORDER — SODIUM CHLORIDE 0.9 % IV SOLN
INTRAVENOUS | Status: DC
  Administered 2012-11-30: 15:00:00 via INTRAVENOUS

## 2012-11-30 MED ORDER — SUCCINYLCHOLINE CHLORIDE 20 MG/ML IJ SOLN
INTRAMUSCULAR | Status: DC | PRN
  Administered 2012-11-30: 80 mg via INTRAVENOUS

## 2012-11-30 MED ORDER — FENTANYL CITRATE 0.05 MG/ML IJ SOLN
INTRAMUSCULAR | Status: DC | PRN
  Administered 2012-11-30: 25 ug via INTRAVENOUS
  Administered 2012-11-30: 50 ug via INTRAVENOUS

## 2012-11-30 MED ORDER — SODIUM CHLORIDE 0.9 % IV SOLN
INTRAVENOUS | Status: DC | PRN
  Administered 2012-11-30: 15:00:00

## 2012-11-30 MED ORDER — CIPROFLOXACIN IN D5W 400 MG/200ML IV SOLN
INTRAVENOUS | Status: AC
  Filled 2012-11-30: qty 200

## 2012-11-30 MED ORDER — PROPOFOL 10 MG/ML IV BOLUS
INTRAVENOUS | Status: DC | PRN
  Administered 2012-11-30: 150 mg via INTRAVENOUS

## 2012-11-30 NOTE — Op Note (Signed)
[facility] [address] [place], [address]   ERCP PROCEDURE REPORT  PATIENT: Brianna Barnes, Brianna Barnes.  MR# :[id number] BIRTHDATE: [birth year]  GENDER: Female ENDOSCOPIST: Rachael Fee, MD REFERRED BY: Erick Blinks, MD PROCEDURE DATE:  11/30/2012 PROCEDURE:   ERCP with sphincterotomy/papillotomy and ERCP with removal of calculus/calculi ASA CLASS:   Class III INDICATIONS:lap chole 2013, recently elevated liver tests (obstructive pattern), MRCP showed several small stones in CBD; also known cirrhosis. MEDICATIONS: General endotracheal anesthesia (GETA) and Cipro 400 mg IV TOPICAL ANESTHETIC: none  DESCRIPTION OF PROCEDURE:   After the risks benefits and alternatives of the procedure were thoroughly explained, informed consent was obtained.  The Pentax ERCP [id number]  endoscope was introduced through the mouth  and advanced to the second portion of the duodenum.  There was a single trunk of Grade 1 distal esophageal varices. There was mild protal gastropathy without gastric varices evident.  The major papilla was normal, expressing normal appearing yellow bile. The bile duct was cannulated using a 44 Autotome over a .035 hydrawire and contrast was injected. Cholangiogram revealed several small mobile filling defects, no strictures. The extrahepatic bile duct was slightly dilated (7mm). An adequate biliary sphincterotomy was performed over the biliary wire and then a balloon was used to extract several small yellowish pigmented stones and stone debris.  Completion occlusion cholangiogram showed no further filling defects. The apex of the sphincterotomy oozed more than usual amount of blood but this stopped with 1-2 minutes of observation, flushes with saline.  The pancreatic duct was never cannulated or injected with dye.  The scope was then completely withdrawn from the patient and the procedure terminated.   COMPLICATIONS: no immediate complications  ENDOSCOPIC  IMPRESSION: Choledocholiathiasis, treated with biliary sphincterotomy and balloon sweeping. Single Grade 1 distal esophagus varix. Mild portal gastropathy. No gastric varices.  RECOMMENDATIONS: Follow clinically.  _______________________________ eSignedRachael Fee, MD 11/30/2012 2:53 PM

## 2012-11-30 NOTE — Anesthesia Postprocedure Evaluation (Signed)
  Anesthesia Post-op Note  Patient: Brianna Barnes  Procedure(s) Performed: Procedure(s) (LRB): ENDOSCOPIC RETROGRADE CHOLANGIOPANCREATOGRAPHY (ERCP) WITH PROPOFOL (N/A)  Patient Location: PACU  Anesthesia Type: General  Level of Consciousness: awake and alert   Airway and Oxygen Therapy: Patient Spontanous Breathing  Post-op Pain: mild  Post-op Assessment: Post-op Vital signs reviewed, Patient's Cardiovascular Status Stable, Respiratory Function Stable, Patent Airway and No signs of Nausea or vomiting  Last Vitals:  Filed Vitals:   11/30/12 1510  BP: 145/85  Pulse:   Temp:   Resp: 16    Post-op Vital Signs: stable   Complications: No apparent anesthesia complications

## 2012-11-30 NOTE — Interval H&P Note (Signed)
History and Physical Interval Note:  11/30/2012 1:39 PM  Brianna Barnes  has presented today for surgery, with the diagnosis of Gall stones, common bile duct [574.50]  The various methods of treatment have been discussed with the patient and family. After consideration of risks, benefits and other options for treatment, the patient has consented to  Procedure(s): ENDOSCOPIC RETROGRADE CHOLANGIOPANCREATOGRAPHY (ERCP) WITH PROPOFOL (N/A) as a surgical intervention .  The patient's history has been reviewed, patient examined, no change in status, stable for surgery.  I have reviewed the patient's chart and labs.  Questions were answered to the patient's satisfaction.     Rachael Fee

## 2012-11-30 NOTE — Transfer of Care (Signed)
Immediate Anesthesia Transfer of Care Note  Patient: Brianna Barnes  Procedure(s) Performed: Procedure(s): ENDOSCOPIC RETROGRADE CHOLANGIOPANCREATOGRAPHY (ERCP) WITH PROPOFOL (N/A)  Patient Location: PACU  Anesthesia Type:MAC  Level of Consciousness: awake, alert , oriented and patient cooperative  Airway & Oxygen Therapy: Patient Spontanous Breathing and Patient connected to nasal cannula oxygen  Post-op Assessment: Report given to PACU RN, Post -op Vital signs reviewed and stable and Patient moving all extremities X 4  Post vital signs: stable  Complications: No apparent anesthesia complications

## 2012-11-30 NOTE — H&P (View-Only) (Signed)
Patient ID: Brianna Barnes, female   DOB: 03/26/1928, 79 y.o.   MRN: [id number] HPI: [name] is an 79 yo female with PMH of osteoarthritis, GERD with peptic ulcer disease, osteoporosis, gallstones status post cholecystectomy in summer 2013, anemia, and new diagnosis of cirrhosis who is seen in consultation at the request of Dr. Copland for evaluation of liver disease.  She is here today with her daughter.  The patient reports being told that she may have liver disease and that she needed to meet with a GI doctor.  She does not recall ever being told that her liver was abnormal or her liver tests elevated. She denies a history of jaundice, ascites, lower extremity edema, GI bleeding or confusion. She has noted over the last several months fatigue, decreased energy level, decreased appetite and low-grade fever. She denies nausea or vomiting. She does have a history of heartburn but this has been well controlled with omeprazole 20 mg daily. She reports feeling full fairly quickly with eating. She reports a history of prior blood transfusion prior to 1990. No tattoos. No history of alcohol use.  Interestingly she does have 2 siblings with chronic liver disease/cirrhosis. This is from unclear cause though she does recall both of them having issues with hepatic encephalopathy ("ammonia")  She had her gallbladder removed in June of 2013. She does not recall being told of any cirrhosis or liver scarring at that time  In the past several months she has been started on iron replacement over-the-counter and also amitriptyline for sleep. No other new medications.  Past Medical History  Diagnosis Date  . Anemia   . Osteoarthritis     hands, knees, and low back  . GERD (gastroesophageal reflux disease)   . Ulcer     peptic  . Osteoporosis   . Gallstone     Past Surgical History  Procedure Laterality Date  . External ear surgery  1980    left side  . Spine surgery  1990    lumbar, ruptured disc  .  Knee surgery  2004    arthroscopic  . Appendectomy    . Cholecystectomy  08/25/2011    Procedure: LAPAROSCOPIC CHOLECYSTECTOMY WITH INTRAOPERATIVE CHOLANGIOGRAM;  Surgeon: Burke E Thompson, MD;  Location: [facility] OR;  Service: General;  Laterality: N/A;    Current Outpatient Prescriptions  Medication Sig Dispense Refill  . acetaminophen (TYLENOL) 325 MG tablet Take 325 mg by mouth every 6 (six) hours as needed. For pain      . amitriptyline (ELAVIL) 25 MG tablet Take 1 tablet (25 mg total) by mouth at bedtime.  30 tablet  3  . aspirin EC 81 MG tablet Take 81 mg by mouth daily.      . calcium-vitamin D (OSCAL WITH D) 500-200 MG-UNIT per tablet Take 1 tablet by mouth daily.      . Cholecalciferol (VITAMIN D3) 400 UNITS CAPS Take 400 mg by mouth 2 (two) times daily.        . omeprazole (PRILOSEC) 20 MG capsule Take 1 capsule (20 mg total) by mouth daily.  90 capsule  1  . vitamin E 400 UNIT capsule Take 400 Units by mouth daily.       No current facility-administered medications for this visit.    No Known Allergies  Family History  Problem Relation Age of Onset  . Heart disease Mother   . Heart disease Father 60  . Hyperlipidemia Father   . Diabetes Father   . Cirrhosis Sister   .   Diabetes Brother   . Heart disease Brother     History  Substance Use Topics  . Smoking status: Never Smoker   . Smokeless tobacco: Never Used  . Alcohol Use: No    ROS: As per history of present illness, otherwise negative  BP 118/68  Pulse 95  Ht 4' 11" (1.499 m)  Wt 108 lb (48.988 kg)  BMI 21.8 kg/m2 Constitutional: Well-developed and well-nourished. No distress. HEENT: Normocephalic and atraumatic. Oropharynx is clear and moist. No oropharyngeal exudate. Conjunctivae are somewhat pale.  No scleral icterus. Neck: Neck supple. Trachea midline. Cardiovascular: Normal rate, regular rhythm and intact distal pulses.  Pulmonary/chest: Effort normal and breath sounds normal. No wheezing, rales or  rhonchi. Abdominal: Soft, nontender, nondistended. Bowel sounds active throughout. There are no masses palpable.  Extremities: no clubbing, cyanosis, or edema Lymphadenopathy: No cervical adenopathy noted. Neurological: Alert and oriented to person place and time. No asterixis Skin: Skin is warm and dry. No rashes noted. Psychiatric: Normal mood and affect. Behavior is normal.  RELEVANT LABS AND IMAGING: CBC    Component Value Date/Time   WBC 8.7 11/16/2012 1049   RBC 4.08 11/16/2012 1049   HGB 11.3* 11/16/2012 1049   HCT 33.8* 11/16/2012 1049   PLT 215.0 11/16/2012 1049   MCV 83.0 11/16/2012 1049   MCH 30.8 08/26/2011 0635   MCHC 33.5 11/16/2012 1049   RDW 15.9* 11/16/2012 1049   LYMPHSABS 0.8 11/16/2012 1049   MONOABS 0.9 11/16/2012 1049   EOSABS 0.1 11/16/2012 1049   BASOSABS 0.0 11/16/2012 1049    CMP     Component Value Date/Time   NA 135 11/16/2012 1049   K 4.0 11/16/2012 1049   CL 102 11/16/2012 1049   CO2 27 11/16/2012 1049   GLUCOSE 88 11/16/2012 1049   BUN 9 11/16/2012 1049   CREATININE 0.5 11/16/2012 1049   CALCIUM 8.7 11/16/2012 1049   PROT 6.4 11/16/2012 1049   ALBUMIN 3.4* 11/16/2012 1049   AST 77* 11/16/2012 1049   ALT 71* 11/16/2012 1049   ALKPHOS 306* 11/16/2012 1049   BILITOT 1.5* 11/16/2012 1049   GFRNONAA 85* 08/26/2011 0635   GFRAA >90 08/26/2011 0635   GGT - 313  TSH normal  Iron/TIBC/Ferritin    Component Value Date/Time   IRON 30* 11/17/2012 1135   FERRITIN 34.1 11/17/2012 1135    ___________________________________________________________________________________________________ CLINICAL DATA:  Elevated LFTs, bili Rubin, alkaline phosphatase. For left pancreatic or hepatic neoplasm. Prior cholecystectomy.   EXAM: CT ABDOMEN AND PELVIS WITHOUT AND WITH CONTRAST -- Sept 2014   TECHNIQUE: Multidetector CT imaging of the abdomen and pelvis was performed without contrast material in one or both body regions, followed by contrast material(s) and further sections in one or both  body regions.   CONTRAST:  100mL OMNIPAQUE IOHEXOL 350 MG/ML SOLN   COMPARISON:  08/25/2011 ultrasound.   FINDINGS: Mild cardiomegaly. Lung bases are clear. No effusions.   Small hiatal hernia. There are prominent vessels adjacent to the distal esophagus compatible with esophageal varices. There is enlargement of the left hepatic lobe and atrophy of the right hepatic lobe with mildly nodular contours compatible with cirrhosis. Small hypodensities are noted in the dome of the liver, most likely small cysts. The largest measures 10 mm on image 7 of series 6.   Prior cholecystectomy. Intrahepatic and extrahepatic biliary ductal dilatation. The common bile duct measures up to 11 mm. Somewhat high density material noted within the distal common bile duct. Cannot exclude distal CBD stones. 6   mm rounded higher density area noted within the distal CBD on image 21 of series 7.   No pancreatic abnormality. Spleen is enlarged. Adrenals and kidneys are unremarkable.   Aorta and iliac vessels are calcified, non aneurysmal. There is recanalization of the umbilical vein with prominent varices in the umbilicus.   Uterus, adnexae and urinary bladder are unremarkable. Pessary device noted within the vagina. There are scattered sigmoid diverticula. No active diverticulitis. Small bowel is decompressed.   No acute bony abnormality.   IMPRESSION: Changes of cirrhosis with associated splenomegaly, esophageal varices and recanalization of the umbilical vein.   The common bile duct is mildly prominent post cholecystectomy. Higher density material is noted within the distal common bile duct. Cannot exclude distal CBD stone. Consider further evaluation with MRCP or ERCP. ___________________________________________________________________________________  INTRAOPERATIVE CHOLANGIOGRAM -- June 2013   Technique:  Cholangiographic images from the C-arm fluoroscopic device were submitted for  interpretation post-operatively.  Please see the procedural report for the amount of contrast and the fluoroscopy time utilized.   Comparison:  Ultrasound abdomen dated 08/25/2011.   Findings:  Cholecystectomy clips.   Very mild intrahepatic ductal dilatation.   Common duct is mildly dilated and irregular distally near its insertion at the ampulla.   A few small mobile filling defects are present in the distal common bile duct, at least one of which deforms as contrast passes through into the proximal small bowel.  While indeterminate, this appearance suggests air bubbles rather than true common duct stones.   IMPRESSION: Status post cholecystectomy.   Mild intrahepatic and extrahepatic ductal dilatation.   A few mobile filling defects in the distal common bile duct, favored to reflect gas bubbles rather than true stones.   Contrast passes successfully into the proximal small bowel. __________________________________________________________________________________  COMPLETE ABDOMINAL ULTRASOUND - June 2013   Comparison:  05/01/2011 ultrasound   Findings:   Gallbladder:  Distended, containing sludge and/or no shadowing stones.  Echogenic focus along the wall non dependently measures 4 mm.  No appreciable associated vascularity.  May represent to effective sludge or a polyp.  Wall thickness at 3 mm is upper normal.  Negative sonographic Murphy's sign.   Common bile duct:  Prominent at 1 cm.  The distal duct is obscured by overlying bowel gas artifact.   Liver:  Poorly visualized due to artifact/overlying bowel.   IVC:  Appears normal.   Pancreas:  Poorly visualized due to overlying bowel gas artifact.   Spleen:  Splenomegaly is again noted, measuring up to 13 cm in length.   Right Kidney:  Measures 10.2 cm.  No hydronephrosis or focal abnormality.   Left Kidney:  Measures 10.1 cm.  No hydronephrosis or focal abnormality.   Abdominal aorta:  Scattered  atherosclerotic calcification. Portions of the aorta are obscured by overlying bowel gas artifact. Where seen, measures up to 2 cm in diameter.   IMPRESSION: Distended gallbladder containing sludge and/or nonshadowing stones. Wall thickness at upper normal limits.  Negative sonographic Murphy's sign.  If clinical concern persists, recommend HIDA scan follow up.   4 mm tumefactive sludge versus polyp along the gallbladder wall non dependently.   Prominent common bile duct at 1 cm.  Distal duct is obscured by overlying bowel gas artifact. Choledocholithiasis is not excluded. If warranted, follow-up with ERCP or MRCP.   Splenomegaly.  Prior endoscopies Colonoscopy 07/09/2008, Dr. George Orr -- polyps of the transverse colon, descending colon and rectosigmoid colon. Pathology = tubular adenoma x2, hyperplastic polyp EGD and colonoscopy 03/21/2006,   Dr. George Orr -- 4 small AVMs seen in the duodenal bulb, question of Barrett's esophagus biopsied. Colonoscopy report not available the pathology report available. Pathology = distal esophagus = GE with mild inflammation no metaplasia dysplasia or malignancy.  Colon polyps 2 adenomatous polyps. 2 hyperplastic polyps.    ASSESSMENT/PLAN: 79 yo female with PMH of osteoarthritis, GERD with peptic ulcer disease, osteoporosis, gallstones status post cholecystectomy in summer 2013, anemia, and new diagnosis of cirrhosis who is seen in consultation at the request of Dr. Copland for evaluation of liver disease.  1.  Cryptogenic Cirrhosis -- the patient has imaging consistent with cirrhosis with portal hypertension as manifested by intra-abdominal varices. I do not know what her INR is, but it is pending.  There is evidence of splenomegaly but not thrombocytopenia. Her disease is very well compensated at this point. She also has a very compelling family history of cirrhosis.  We spoke at length today about cirrhosis and chronic liver disease including  causes, manifestations, and natural history. I have recommended additional lab test to evaluate for other causes of cirrhosis, especially hereditary once given her family history. I would also like to rule out viral hepatitis given her history of remote blood transfusion, though this is felt less likely.  ANA, IgG, AMSA, AMA, ceruloplasmin, alpha-1 antitrypsin, viral serologies.  I have also recommended an upper endoscopy to screen for esophageal varices, but will hold on this for now until we exclude common bile duct stones, see #2. I will see her back for ongoing cirrhosis management once we sort out the possibility of retained bile duct stone leading to low grade obstruction. --May need vaccination, depending on viral serologies --HCC screening up to date with recent CT --Esophageal varices screening -- to be done see above  2.  Hx of gallstones/possible choledocholithiasis -- her LFTs are elevated, and there is somewhat of a mixed picture. At this time they appear to be cholestatic predominant.  Her common bile duct is enlarged, which could be due to post cholecystectomy state, but the CT also raises questions of possible retained stone.  I have reviewed her intraoperative cholangiogram from last summer, and there was distal duct defects which was felt to be gas/air.  I have recommended MRCP for further evaluation of common bile duct stone. If stones or sludge are present, ERCP would be recommended. We did discuss ERCP at length today, but I would like to proceed with MRCP first given the higher risks associated with ERCP.    3.  Hx of adenomatous colon polyps -- last colonoscopy 2010. Based on her age and other ongoing issues as described above screening colonoscopy is not recommended at this time  Time was provided for questions and answers today. I will see her back after imaging and laboratory tests. She is asked to call if she has any questions or concerns prior to followup      

## 2012-11-30 NOTE — Anesthesia Preprocedure Evaluation (Addendum)
Anesthesia Evaluation  Patient identified by MRN, date of birth, ID band Patient awake    Reviewed: Allergy & Precautions, H&P , NPO status , Patient's Chart, lab work & pertinent test results  Airway Mallampati: II TM Distance: >3 FB Neck ROM: Full    Dental  (+) Upper Dentures and Lower Dentures   Pulmonary neg pulmonary ROS,  breath sounds clear to auscultation  Pulmonary exam normal       Cardiovascular negative cardio ROS  Rhythm:Regular Rate:Normal  Patient and daughter deny h/o htn.   Neuro/Psych negative neurological ROS  negative psych ROS   GI/Hepatic PUD, GERD-  Medicated,(+) Cirrhosis -       , Cryptogenic cirrhosis.   Endo/Other  Denies h/o prediabetes.  Renal/GU negative Renal ROS  negative genitourinary   Musculoskeletal negative musculoskeletal ROS (+)   Abdominal   Peds negative pediatric ROS (+)  Hematology negative hematology ROS (+)   Anesthesia Other Findings   Reproductive/Obstetrics negative OB ROS                         Anesthesia Physical Anesthesia Plan  ASA: II  Anesthesia Plan: General   Post-op Pain Management:    Induction: Intravenous  Airway Management Planned: Oral ETT  Additional Equipment:   Intra-op Plan:   Post-operative Plan: Extubation in OR  Informed Consent: I have reviewed the patients History and Physical, chart, labs and discussed the procedure including the risks, benefits and alternatives for the proposed anesthesia with the patient or authorized representative who has indicated his/her understanding and acceptance.   Dental advisory given  Plan Discussed with: CRNA  Anesthesia Plan Comments:       Anesthesia Quick Evaluation

## 2012-12-01 ENCOUNTER — Telehealth: Payer: Self-pay | Admitting: *Deleted

## 2012-12-01 ENCOUNTER — Encounter (HOSPITAL_COMMUNITY): Payer: Self-pay | Admitting: Gastroenterology

## 2012-12-01 NOTE — Telephone Encounter (Signed)
Need office followup in 3 weeks or so JMP ----- Message ----- From: Rachael Fee, MD Sent: 11/30/2012 2:54 PM To: Beverley Fiedler, MD Vonna Kotyk, Just completed ERCP. Went smoothly. Thanks [employer] with pt who states she is OK today; informed her she needs a f/u appt with Dr Rhea Belton and she stated understanding. Pt mailed a letter with appt for 12/26/12 at 0900am.

## 2012-12-05 ENCOUNTER — Other Ambulatory Visit: Payer: Self-pay | Admitting: Gastroenterology

## 2012-12-05 DIAGNOSIS — D649 Anemia, unspecified: Secondary | ICD-10-CM

## 2012-12-26 ENCOUNTER — Ambulatory Visit: Payer: Medicare Other | Admitting: Internal Medicine

## 2012-12-29 ENCOUNTER — Ambulatory Visit: Payer: Medicare Other | Admitting: Internal Medicine

## 2013-01-02 ENCOUNTER — Ambulatory Visit (HOSPITAL_COMMUNITY)
Admission: RE | Admit: 2013-01-02 | Discharge: 2013-01-02 | Disposition: A | Discharge status: Home / Self Care | Payer: Medicare Other | Source: Ambulatory Visit | Attending: Family Medicine | Admitting: Family Medicine

## 2013-01-02 ENCOUNTER — Other Ambulatory Visit (INDEPENDENT_AMBULATORY_CARE_PROVIDER_SITE_OTHER): Payer: Medicare Other

## 2013-01-02 DIAGNOSIS — Z1231 Encounter for screening mammogram for malignant neoplasm of breast: Secondary | ICD-10-CM

## 2013-01-02 DIAGNOSIS — D509 Iron deficiency anemia, unspecified: Secondary | ICD-10-CM

## 2013-01-02 DIAGNOSIS — D649 Anemia, unspecified: Secondary | ICD-10-CM

## 2013-01-02 LAB — CBC
MCHC: 33.3 g/dL (ref 30.0–36.0)
MCV: 83.1 fl (ref 78.0–100.0)
Platelets: 182 10*3/uL (ref 150.0–400.0)

## 2013-01-02 LAB — HEPATIC FUNCTION PANEL
ALT: 30 U/L (ref 0–35)
Alkaline Phosphatase: 182 U/L — ABNORMAL HIGH (ref 39–117)
Bilirubin, Direct: 0.2 mg/dL (ref 0.0–0.3)
Total Protein: 5.9 g/dL — ABNORMAL LOW (ref 6.0–8.3)

## 2013-01-02 LAB — BASIC METABOLIC PANEL
BUN: 10 mg/dL (ref 6–23)
Calcium: 8.6 mg/dL (ref 8.4–10.5)
GFR: 114.87 mL/min (ref >60.00)
Potassium: 4.2 mEq/L (ref 3.5–5.1)
Sodium: 140 mEq/L (ref 135–145)

## 2013-01-02 LAB — PROTIME-INR: Prothrombin Time: 11.8 s (ref 10.2–12.4)

## 2013-01-03 ENCOUNTER — Encounter: Payer: Self-pay | Admitting: Internal Medicine

## 2013-01-04 ENCOUNTER — Encounter: Payer: Self-pay | Admitting: Internal Medicine

## 2013-01-04 ENCOUNTER — Ambulatory Visit (INDEPENDENT_AMBULATORY_CARE_PROVIDER_SITE_OTHER): Payer: Medicare Other | Admitting: Internal Medicine

## 2013-01-04 VITALS — BP 120/72 | HR 72 | Ht <= 58 in | Wt 110.8 lb

## 2013-01-04 DIAGNOSIS — K7469 Other cirrhosis of liver: Secondary | ICD-10-CM

## 2013-01-04 DIAGNOSIS — R197 Diarrhea, unspecified: Secondary | ICD-10-CM

## 2013-01-04 DIAGNOSIS — K219 Gastro-esophageal reflux disease without esophagitis: Secondary | ICD-10-CM

## 2013-01-04 DIAGNOSIS — K7460 Unspecified cirrhosis of liver: Secondary | ICD-10-CM

## 2013-01-04 DIAGNOSIS — K8050 Calculus of bile duct without cholangitis or cholecystitis without obstruction: Secondary | ICD-10-CM

## 2013-01-04 NOTE — Patient Instructions (Signed)
Your physician has requested that you go to the basement for the following lab work before you follow up with Dr. Rhea Belton in January. Please go to the lab at your convenience 1 week prior to your follow up appointment.  Wear compression stalkings when traveling.   Use imodium on days you have loose stool.  Keep a food journal to see if you are having loose stools after eating/drinking certain foods.  Dr. Rhea Belton recommends getting a Hep B vaccine.                                                We are excited to introduce MyChart, a new best-in-class service that provides you online access to important information in your electronic medical record. We want to make it easier for you to view your health information - all in one secure location - when and where you need it. We expect MyChart will enhance the quality of care and service we provide.  When you register for MyChart, you can:    View your test results.    Request appointments and receive appointment reminders via email.    Request medication renewals.    View your medical history, allergies, medications and immunizations.    Communicate with your physician's office through a password-protected site.    Conveniently print information such as your medication lists.  To find out if MyChart is right for you, please talk to a member of our clinical staff today. We will gladly answer your questions about this free health and wellness tool.  If you are age 79 or older and want a member of your family to have access to your record, you must provide written consent by completing a proxy form available at our office. Please speak to our clinical staff about guidelines regarding accounts for patients younger than age 73.  As you activate your MyChart account and need any technical assistance, please call the MyChart technical support line at [phone]) or email your question to [email or web address]. If you email your  question(s), please include your name, a return phone number and the best time to reach you.  If you have non-urgent health-related questions, you can send a message to our office through MyChart at [email or web address]. If you have a medical emergency, call 911.  Thank you for using MyChart as your new health and wellness resource!   MyChart licensed from [employer],  [id number]. Patents Pending.

## 2013-01-04 NOTE — Progress Notes (Signed)
Subjective:    Patient ID: Brianna Barnes, female    DOB: 12/16/1928, 79 y.o.   MRN: [id number]  HPI Mrs. Trachtenberg is an 79 yo female with PMH of cryptogenic cirrhosis, choledocholithiasis status post ERCP for stone extraction, osteoarthritis, GERD with peptic ulcer disease, osteoporosis, gallstones status post cholecystectomy in summer 2013, anemia, who is seen in followup. She is here today with her daughter. She had an ERCP with sphincterotomy and removal of CBD stone on 11/30/2012. Dr. Christella Hartigan performed this test. She had mild portal hypertensive gastropathy and a single grade 1 esophageal varix.  She reports she did well after this procedure. Today she denies abdominal pain. No jaundice. She denies increasing abdominal girth, but did note several days of mild swelling of her ankles after a recent car trip to [place]. The swelling has since resolved. She reports a good appetite. No heartburn when she is using her omeprazole daily. She denies nausea. She does report intermittent diarrhea. This seems to occur approximately 2 days per week and at other times she is having normal formed stools. On her diarrhea days she reports loose watery stools which are nonbloody and non-melenic, occurring 5-7 times daily. She also notices bloating on these days.  She has not been able to identify a trigger. No confusion or increased sleepiness. She is planning a trip to [place] early next month for her granddaughter's wedding.  Review of Systems As per history of present illness, otherwise negative  Current Medications, Allergies, Past Medical History, Past Surgical History, Family History and Social History were reviewed in [employer] record.     Objective:   Physical Exam BP 120/72  Pulse 72  Ht 4\' 10"  (1.473 m)  Wt 110 lb 12.8 oz (50.259 kg)  BMI 23.16 kg/m2 Constitutional: Well-developed and well-nourished. No distress. HEENT: Normocephalic and atraumatic. Oropharynx is clear and  moist. No oropharyngeal exudate. Conjunctivae are normal.  No scleral icterus. Neck: Neck supple. Trachea midline. Cardiovascular: Normal rate, regular rhythm and intact distal pulses.  Pulmonary/chest: Effort normal and breath sounds normal. No wheezing, rales or rhonchi. Abdominal: Soft, nontender, nondistended. Bowel sounds active throughout. No appreciable ascites Extremities: no clubbing, cyanosis, or edema Lymphadenopathy: No cervical adenopathy noted. Neurological: Alert and oriented to person place and time. No asterixis Skin: Skin is warm and dry. No rashes noted. Psychiatric: Normal mood and affect. Behavior is normal.  CBC    Component Value Date/Time   WBC 4.0* 01/02/2013 1133   RBC 3.98 01/02/2013 1133   HGB 11.0* 01/02/2013 1133   HCT 33.1* 01/02/2013 1133   PLT 182.0 01/02/2013 1133   MCV 83.1 01/02/2013 1133   MCH 30.8 08/26/2011 0635   MCHC 33.3 01/02/2013 1133   RDW 16.8* 01/02/2013 1133   LYMPHSABS 0.8 11/16/2012 1049   MONOABS 0.9 11/16/2012 1049   EOSABS 0.1 11/16/2012 1049   BASOSABS 0.0 11/16/2012 1049    CMP     Component Value Date/Time   NA 140 01/02/2013 1133   K 4.2 01/02/2013 1133   CL 104 01/02/2013 1133   CO2 28 01/02/2013 1133   GLUCOSE 100* 01/02/2013 1133   BUN 10 01/02/2013 1133   CREATININE 0.5 01/02/2013 1133   CALCIUM 8.6 01/02/2013 1133   PROT 5.9* 01/02/2013 1133   ALBUMIN 3.2* 01/02/2013 1133   AST 47* 01/02/2013 1133   ALT 30 01/02/2013 1133   ALKPHOS 182* 01/02/2013 1133   BILITOT 0.8 01/02/2013 1133   GFRNONAA 85* 08/26/2011 0635   GFRAA >90  08/26/2011 0635   Results for AIREANNA, LUELLEN (MRN [id number]) as of 01/04/2013 11:29  Ref. Range 08/26/2011 06:35 11/16/2012 10:49 11/17/2012 11:35 11/22/2012 10:14 01/02/2013 11:33  Alkaline Phosphatase Latest Range: 39-117 U/L 63 306 (H)  431 (H) 182 (H)  Albumin Latest Range: 3.5-5.2 g/dL 2.9 (L) 3.4 (L)  3.7 3.2 (L)  Amylase Latest Range: 27-131 U/L   42    Lipase Latest Range: 11.0-59.0 U/L    19.0    AST Latest Range: 0-37 U/L 106 (H) 77 (H)  54 (H) 47 (H)  ALT Latest Range: 0-35 U/L 73 (H) 71 (H)  52 (H) 30  Total Protein Latest Range: 6.0-8.3 g/dL 5.1 (L) 6.4  7.2 5.9 (L)  Bilirubin, Direct Latest Range: 0.0-0.3 mg/dL  0.5 (H)   0.2  Total Bilirubin Latest Range: 0.3-1.2 mg/dL 0.8 1.5 (H)  1.0 0.8    INR 1.1  ANA neg, IGG normal Alpha one - not low Hep A immune Hep B neg, not immune Hep C neg Anti-smooth muscle - negative Ceruloplasmin - normal Ferritin - normal     Assessment & Plan:  79 yo female with PMH of cryptogenic cirrhosis, choledocholithiasis status post ERCP for stone extraction, osteoarthritis, GERD with peptic ulcer disease, osteoporosis, gallstones status post cholecystectomy in summer 2013, anemia, who is seen in followup.  1.  Choledocholithiasis is post ERCP with sphincterotomy and stone extraction -- she did well after her ERCP and her liver enzymes have decreased appropriately. Her bilirubin has normalized and her alkaline phosphatase decline significantly. Her AST remains very slightly elevated at 47 but ALT normalized. Given her history of cholecystectomy, this will likely not be an issue for her in the future.  2.  Cryptogenic cirrhosis -- after lab testing no genetic or other cause was found to explain her cirrhosis. Her liver disease remains very well compensated, without evidence for volume overload, jaundice, encephalopathy. Recent upper endoscopy revealed grade 1 esophageal varices. We have discussed the addition of a nonselective beta blocker today at length. Given that her disease is well compensated and the varix was very small, we will not initiate beta blockers today. I am somewhat concerned that the addition of this medication may lead to more fatigue and put her more at risk for falling. Should her disease become decompensated, we can rediscuss beta blocker initiation at that time, because decompensated cirrhosis increases portal hypertension  and likely degree of varices.   They understand and agree with this recommendation. She is up-to-date on HCC screening. We discussed hepatitis B vaccination and she would like to discuss this further with her other daughter. She can have this done either my office or her primary care office. She has already had a flu vaccine --No evidence of lower extremity edema today I expect this was secondary to her long car ride. I have recommended knee-high compression stockings on her travel days, especially with her upcoming trip to [place]  3.  Diarrhea -- occurring sporadically. She has recently started drinking Ensure. I wonder if this is causing an osmotic diarrhea. I've asked that she keep a food diary to determine if there are any triggers that cause her diarrhea. She can use Imodium on the days she is having diarrhea per box instructions. She has tried this in the past and it helped. If her diarrhea continues, worsens, we will evaluate further, likely starting the stool studies. My suspicion for infection is quite low given that it is so sporadic.    4.  GERD --  well controlled with omeprazole 20 mg daily. She will continue with this dose  Followup in 3 months, sooner if necessary, labs 1 week before appt with me.

## 2013-01-08 ENCOUNTER — Other Ambulatory Visit (INDEPENDENT_AMBULATORY_CARE_PROVIDER_SITE_OTHER): Payer: Medicare Other

## 2013-01-08 DIAGNOSIS — E785 Hyperlipidemia, unspecified: Secondary | ICD-10-CM

## 2013-01-08 DIAGNOSIS — D649 Anemia, unspecified: Secondary | ICD-10-CM

## 2013-01-08 LAB — CBC WITH DIFFERENTIAL/PLATELET
Eosinophils Absolute: 0.2 10*3/uL (ref 0.0–0.7)
MCHC: 33.2 g/dL (ref 30.0–36.0)
MCV: 83.6 fl (ref 78.0–100.0)
Monocytes Absolute: 0.6 10*3/uL (ref 0.1–1.0)
Neutrophils Relative %: 60.8 % (ref 43.0–77.0)
Platelets: 175 10*3/uL (ref 150.0–400.0)
WBC: 5 10*3/uL (ref 4.5–10.5)

## 2013-01-08 LAB — LIPID PANEL
HDL: 49.1 mg/dL (ref >39.00)
LDL Cholesterol: 106 mg/dL — ABNORMAL HIGH (ref 0–99)
Triglycerides: 90 mg/dL (ref 0.0–149.0)
VLDL: 18 mg/dL (ref 0.0–40.0)

## 2013-01-11 ENCOUNTER — Other Ambulatory Visit: Payer: Medicare Other

## 2013-01-18 ENCOUNTER — Ambulatory Visit (INDEPENDENT_AMBULATORY_CARE_PROVIDER_SITE_OTHER): Payer: Medicare Other | Admitting: Family Medicine

## 2013-01-18 ENCOUNTER — Other Ambulatory Visit: Payer: Self-pay

## 2013-01-18 ENCOUNTER — Encounter: Payer: Self-pay | Admitting: Family Medicine

## 2013-01-18 VITALS — BP 130/64 | HR 92 | Temp 98.3°F | Ht 58.5 in | Wt 109.8 lb

## 2013-01-18 DIAGNOSIS — K7460 Unspecified cirrhosis of liver: Secondary | ICD-10-CM

## 2013-01-18 DIAGNOSIS — Z0000 Encounter for general adult medical examination without abnormal findings: Secondary | ICD-10-CM

## 2013-01-18 DIAGNOSIS — M79605 Pain in left leg: Secondary | ICD-10-CM

## 2013-01-18 DIAGNOSIS — I10 Essential (primary) hypertension: Secondary | ICD-10-CM

## 2013-01-18 DIAGNOSIS — E785 Hyperlipidemia, unspecified: Secondary | ICD-10-CM

## 2013-01-18 DIAGNOSIS — M79609 Pain in unspecified limb: Secondary | ICD-10-CM

## 2013-01-18 DIAGNOSIS — K7469 Other cirrhosis of liver: Secondary | ICD-10-CM

## 2013-01-18 NOTE — Assessment & Plan Note (Signed)
No clear origin in low back, neg SLR, some arthritis in hips but no pain there, no knee pain.  ? Due to restless leg?  Will have her use tylenol in limited fashion for pain given liver issues.. Start stretching.  If not improving.. Try trial of requip. If still not better look into low back pathology with X-ray etc.

## 2013-01-18 NOTE — Assessment & Plan Note (Signed)
Followed by GI, compensated.

## 2013-01-18 NOTE — Assessment & Plan Note (Signed)
Well controlled on no med. 

## 2013-01-18 NOTE — Assessment & Plan Note (Signed)
Well controlled on no med., 

## 2013-01-18 NOTE — Patient Instructions (Addendum)
Start gentle stretchiung, use tyelnol in a limited fashion for pain. Call if not improving and we can consider trial of a restless leg medication.. Called requip. Follow up in 6 months.

## 2013-01-18 NOTE — Progress Notes (Signed)
Pre-visit discussion using our clinic review tool. No additional management support is needed unless otherwise documented below in the visit note.  

## 2013-01-18 NOTE — Progress Notes (Signed)
I have personally reviewed the Medicare Annual Wellness questionnaire and have noted  1. The patient's medical and social history  2. Their use of alcohol, tobacco or illicit drugs  3. Their current medications and supplements  4. The patient's functional ability including ADL's, fall risks, home safety risks and hearing or visual  impairment.  5. Diet and physical activities  6. Evidence for depression or mood disorders  The patients weight, height, BMI and visual acuity have been recorded in the chart  I have made referrals, counseling and provided education to the patient based review of the above and I have provided the pt with a written personalized care plan for preventive services.   Cryptogenic cirrhosis: folled by Dr. Rhea Belton After lab testing no genetic or other cause was found to explain her cirrhosis. Her liver disease remains very well compensated, without evidence for volume overload, jaundice, encephalopathy. Recent upper endoscopy revealed grade 1 esophageal varices. We have discussed the addition of a nonselective beta blocker today at length. Given that her disease is well compensated and the varix was very small, we will not initiate beta blockers today. I am somewhat concerned that the addition of this medication may lead to more fatigue and put her more at risk for falling. Should her disease become decompensated, we can rediscuss beta blocker initiation at that time, because decompensated cirrhosis increases portal hypertension and likely degree of varices.   Choledocholithiasis status post ERCP  With sphincterotomy and removal of CBD stone on 11/30/2012 Liver function tets improved after procedure. Followed by Dr. Rhea Belton, GI.  Hypertension: Well controlled on no medication.  BP Readings from Last 3 Encounters:  01/18/13 130/64  01/04/13 120/72  11/30/12 145/76  Using medication without problems or lightheadedness:  Chest pain with exertion:None  Edema: yes with trip to  [place]... Resolved with compression hose. Short of breath:None  Average home BPs: not checking at home  Other issues: Sister age 103 diagnosed with CAD.Marland Kitchen Had CABG x 4.   Elevated Cholesterol: Well controlled previously on no medicaition.  Lab Results  Component Value Date   CHOL 173 01/08/2013   HDL 49.10 01/08/2013   LDLCALC 106* 01/08/2013   LDLDIRECT 135.2 06/06/2009   TRIG 90.0 01/08/2013   CHOLHDL 4 01/08/2013   Osteoporosis: On fosamax. Has been on this medication for 10 years.   Vit D def, resolved s/p supplementation with rx dosing. On twice daily 400 mg D3   Intermittant pain in left thigh (ongoing for years, started as burning) now more pain, worse at night. Tylenol helps somes. No low back pain.  No creepy crawly feeling, but feels as she has to move leg. Movement helps.    Review of Systems  Constitutional: Negative for fever and fatigue.  HENT: Negative for ear pain.  Eyes: Negative for pain.  Respiratory: Negative for cough, shortness of breath and wheezing.  Cardiovascular: Negative for chest pain, palpitations and leg swelling.  Gastrointestinal: negative for constipation. Negative for nausea, abdominal pain, diarrhea and blood in stool.  Genitourinary: Negative for dysuria, vaginal bleeding and vaginal pain.  Skin: Negative for rash.  Neurological: Negative for dizziness, syncope and light-headedness.  Psychiatric/Behavioral: Negative for dysphoric mood. The patient is not nervous/anxious.  Objective:   Physical Exam  Constitutional: Vital signs are normal. She appears well-developed and well-nourished. She is cooperative. Non-toxic appearance. She does not appear ill. No distress.  HENT: Head: Normocephalic.  Right Ear: Hearing, tympanic membrane, external ear and ear canal normal.  Left Ear: Hearing,  tympanic membrane, external ear and ear canal normal.  Nose: Nose normal.  Eyes: Conjunctivae, EOM and lids are normal. Pupils are equal, round, and  reactive to light. No foreign bodies found.  Neck: Trachea normal and normal range of motion. Neck supple. Carotid bruit is not present. No mass and no thyromegaly present. Neg spurlings Cardiovascular: Normal rate, regular rhythm, S1 normal, S2 normal, normal heart sounds and intact distal pulses. Exam reveals no gallop.  No murmur heard.  Pulmonary/Chest: Effort normal and breath sounds normal. No respiratory distress. She has no wheezes. She has no rhonchi. She has no rales.  Abdominal: Soft. Normal appearance and bowel sounds are normal. She exhibits no distension, no fluid wave, no abdominal bruit and no mass. There is no hepatosplenomegaly. There is no tenderness. There is no rebound, no guarding and no CVA tenderness. No hernia.  Genitourinary: No breast swelling, tenderness, discharge or bleeding. Pelvic exam was Not performed. Lymphadenopathy:  She has no cervical adenopathy.  She has no axillary adenopathy.  Neurological: She is alert. She has normal strength. No cranial nerve deficit or sensory deficit.  Skin: Skin is warm, dry and intact. No rash noted.  Psychiatric: Her speech is normal and behavior is normal. Judgment normal. Her mood appears not anxious. Cognition and memory are normal. She does not exhibit a depressed mood.  No low back ttp, no focal ttp in hip or left leg, but decreased ROM in B hips, strength 5/5 B lower ext. Assessment & Plan:   Annual medicare Wellness: The patient's preventative maintenance and recommended screening tests for an annual wellness exam were reviewed in full today.  Brought up to date unless services declined.  Counselled on the importance of diet, exercise, and its role in overall health and mortality.  The patient's FH and SH was reviewed, including their home life, tobacco status, and drug and alcohol status.   UPTD with Td and PNa, has had shingles vaccine. Flu [facility].  Last DXA: 2011, on fosamax for 10 years, will  stopped in 2013. Plan repeat in 2015. Last colonoscopy: 2010, no repeat needed.  Mammogram 2014 nml   Will check lipids, vit D every other year.

## 2013-02-09 ENCOUNTER — Telehealth: Payer: Self-pay | Admitting: *Deleted

## 2013-02-09 ENCOUNTER — Other Ambulatory Visit: Payer: Self-pay | Admitting: Family Medicine

## 2013-02-09 MED ORDER — ROPINIROLE HCL 0.25 MG PO TABS: 0.2500 mg | ORAL_TABLET | Freq: Every day | ORAL | Status: DC

## 2013-02-09 NOTE — Telephone Encounter (Signed)
done

## 2013-02-09 NOTE — Telephone Encounter (Signed)
Deborah left voicemail stating Brianna Barnes was seen for her physical on 01/18/2013.  At that office visit Brianna Barnes complained about leg pain. Dr. Ermalene Searing instructed her to take Tylenol for that pain and if it continued to call us back.  Gavin Pound calling in today because the leg pain still continues. Wanting to know what to do now.  I returned Deborah's call and advised per Dr. Daphine Deutscher office note the next step would be to try a trial of Requip for restless legs.  Gavin Pound is agreeable to that plan.  Financial planner.  Will forward to Dr. Patsy Lager to see if he is willing to send in presrciption since Dr. Ermalene Searing is out of the office until Tuesday.

## 2013-02-16 ENCOUNTER — Other Ambulatory Visit: Payer: Self-pay | Admitting: Family Medicine

## 2013-03-29 ENCOUNTER — Encounter: Payer: Self-pay | Admitting: Internal Medicine

## 2013-04-02 ENCOUNTER — Encounter: Payer: Self-pay | Admitting: Internal Medicine

## 2013-04-02 ENCOUNTER — Ambulatory Visit (INDEPENDENT_AMBULATORY_CARE_PROVIDER_SITE_OTHER): Payer: Managed Care, Other (non HMO) | Admitting: Internal Medicine

## 2013-04-02 ENCOUNTER — Other Ambulatory Visit (INDEPENDENT_AMBULATORY_CARE_PROVIDER_SITE_OTHER): Payer: Managed Care, Other (non HMO)

## 2013-04-02 VITALS — BP 146/78 | HR 72 | Ht <= 58 in | Wt 115.0 lb

## 2013-04-02 DIAGNOSIS — K7460 Unspecified cirrhosis of liver: Secondary | ICD-10-CM

## 2013-04-02 DIAGNOSIS — K7469 Other cirrhosis of liver: Secondary | ICD-10-CM

## 2013-04-02 DIAGNOSIS — R195 Other fecal abnormalities: Secondary | ICD-10-CM

## 2013-04-02 DIAGNOSIS — Z9889 Other specified postprocedural states: Secondary | ICD-10-CM

## 2013-04-02 DIAGNOSIS — K219 Gastro-esophageal reflux disease without esophagitis: Secondary | ICD-10-CM

## 2013-04-02 LAB — COMPREHENSIVE METABOLIC PANEL
ALK PHOS: 143 U/L — AB (ref 39–117)
ALT: 24 U/L (ref 0–35)
AST: 41 U/L — AB (ref 0–37)
Albumin: 3.5 g/dL (ref 3.5–5.2)
BILIRUBIN TOTAL: 0.9 mg/dL (ref 0.3–1.2)
BUN: 8 mg/dL (ref 6–23)
CALCIUM: 8.9 mg/dL (ref 8.4–10.5)
CO2: 28 mEq/L (ref 19–32)
Chloride: 108 mEq/L (ref 96–112)
Creatinine, Ser: 0.6 mg/dL (ref 0.4–1.2)
GFR: 105.71 mL/min (ref >60.00)
Glucose, Bld: 95 mg/dL (ref 70–99)
Potassium: 4.4 mEq/L (ref 3.5–5.1)
Sodium: 142 mEq/L (ref 135–145)
Total Protein: 6.4 g/dL (ref 6.0–8.3)

## 2013-04-02 LAB — CBC
HEMATOCRIT: 34.7 % — AB (ref 36.0–46.0)
HEMOGLOBIN: 11.4 g/dL — AB (ref 12.0–15.0)
MCHC: 33 g/dL (ref 30.0–36.0)
MCV: 84.1 fl (ref 78.0–100.0)
PLATELETS: 154 10*3/uL (ref 150.0–400.0)
RBC: 4.12 Mil/uL (ref 3.87–5.11)
RDW: 15.9 % — AB (ref 11.5–14.6)
WBC: 3.5 10*3/uL — ABNORMAL LOW (ref 4.5–10.5)

## 2013-04-02 LAB — PROTIME-INR
INR: 1.1 ratio — ABNORMAL HIGH (ref 0.8–1.0)
PROTHROMBIN TIME: 11.9 s (ref 10.2–12.4)

## 2013-04-02 NOTE — Progress Notes (Signed)
Subjective:    Patient ID: Brianna Barnes, female    DOB: 01-Oct-1928, 80 y.o.   MRN: [id number]  HPI Brianna Barnes is an 80 yo female with PMH of cryptogenic cirrhosis with portal HTN (small varices and splenomegaly), choledocholithiasis status post ERCP for stone extraction, osteoarthritis, GERD with peptic ulcer disease, osteoporosis, gallstones status post cholecystectomy in summer 2013, anemia, who is seen in followup. She is here today with her daughter. She is feeling very well. Since being here last she has been to [place] further wedding of her granddaughter and to [place] on a trip to [employer]. She reports she is feeling well. No abdominal pain. No abdominal distention or lower extremity edema. No itching. No bleeding including rectal bleeding or melena. No more diarrhea abdominal Versed and regular without constipation. Appetite is good without nausea or vomiting. No significant heartburn, dysphagia or odynophagia. No confusion or increased sleepiness. She is very happy with the way she feels at present  Review of Systems As per HPI, otherwise normal  Current Medications, Allergies, Past Medical History, Past Surgical History, Family History and Social History were reviewed in [employer] record.     Objective:   Physical Exam BP 146/78  Pulse 72  Ht 4\' 10"  (1.473 m)  Wt 115 lb (52.164 kg)  BMI 24.04 kg/m2 Constitutional: Well-developed and well-nourished. No distress. HEENT: Normocephalic and atraumatic. Oropharynx is clear and moist. No oropharyngeal exudate. Conjunctivae are normal.  No scleral icterus. Cardiovascular: Normal rate, regular rhythm and intact distal pulses. Pulmonary/chest: Effort normal and breath sounds normal. No wheezing, rales or rhonchi. Abdominal: Soft, nontender, nondistended. Bowel sounds active throughout. There are no masses palpable. Extremities: no clubbing, cyanosis, or edema Neurological: Alert and oriented to person  place and time. Skin: Skin is warm and dry. No rashes noted. Psychiatric: Normal mood and affect. Behavior is normal.  Labs pending today MRI/MRCP from Sept 2014 reviewed, no liver lesions (HCC screening)  ANA neg, IGG normal  Alpha one - not low  Hep A immune  Hep B neg, not immune  Hep C neg  Anti-smooth muscle - negative  Ceruloplasmin - normal  Ferritin - normal     Assessment & Plan:  80 yo female with PMH of cryptogenic cirrhosis with portal HTN (small varices and splenomegaly), choledocholithiasis status post ERCP for stone extraction, osteoarthritis, GERD with peptic ulcer disease, osteoporosis, gallstones status post cholecystectomy in summer 2013, anemia, who is seen in followup.   1. Cryptogenic cirrhosis -- her liver disease remains very well compensated. There is no evidence for ascites or lower extremity edema at this time. No jaundice. Now we will continue conservative management we have discussed cirrhosis again today. I've asked that she notify me should she develop jaundice, dark urine, increasing swelling in her abdomen or legs, itching or bleeding. She voices understanding.  --Variceal screening  -- up to date. Small esophageal varices. Beta blocker deferred due to age and concern of her dizziness and possible falling.  Can reconsider if disease becomes decompensated  --HCC screening  -- up-to-date by MRI as of September 2014.  We'll likely plan ultrasound in September 2015 with ultrasounds every 6 months thereafter.  If cross-sectional imaging is performed in September then we can monitor yearly for [facility]  --Vaccination  -- hep A immune, hep B not immune vaccination deferred at this time due to age and very low risk factors, flu vaccine 2014  --labs today  2.  Choledocholithiasis is post ERCP with  sphincterotomy and stone extraction -- issue resolved after ERCP.  Checking labs today    3. Diarrhea -- resolved.  She will notify me if this recurs.  4. GERD -- well  controlled with omeprazole 20 mg daily. She will continue with this dose

## 2013-04-02 NOTE — Patient Instructions (Signed)
Your physician has requested that you go to the basement for the following lab work before leaving today: CBC, CMP, INR  Follow up with Dr. Hilarie Fredrickson in office in 6 months                                               We are excited to introduce MyChart, a new best-in-class service that provides you online access to important information in your electronic medical record. We want to make it easier for you to view your health information - all in one secure location - when and where you need it. We expect MyChart will enhance the quality of care and service we provide.  When you register for MyChart, you can:    View your test results.    Request appointments and receive appointment reminders via email.    Request medication renewals.    View your medical history, allergies, medications and immunizations.    Communicate with your physician's office through a password-protected site.    Conveniently print information such as your medication lists.  To find out if MyChart is right for you, please talk to a member of our clinical staff today. We will gladly answer your questions about this free health and wellness tool.  If you are age 78 or older and want a member of your family to have access to your record, you must provide written consent by completing a proxy form available at our office. Please speak to our clinical staff about guidelines regarding accounts for patients younger than age 78.  As you activate your MyChart account and need any technical assistance, please call the MyChart technical support line at [phone]) or email your question to [email or web address]. If you email your question(s), please include your name, a return phone number and the best time to reach you.  If you have non-urgent health-related questions, you can send a message to our office through [place] at [email or web address]. If you have a medical emergency, call 911.  Thank you for  using MyChart as your new health and wellness resource!   MyChart licensed from [employer],  1999-2010. Patents Pending.

## 2013-05-10 ENCOUNTER — Ambulatory Visit: Payer: Managed Care, Other (non HMO) | Admitting: Family Medicine

## 2013-05-14 ENCOUNTER — Encounter: Payer: Self-pay | Admitting: Family Medicine

## 2013-05-14 ENCOUNTER — Ambulatory Visit (INDEPENDENT_AMBULATORY_CARE_PROVIDER_SITE_OTHER): Payer: Managed Care, Other (non HMO) | Admitting: Family Medicine

## 2013-05-14 ENCOUNTER — Ambulatory Visit (INDEPENDENT_AMBULATORY_CARE_PROVIDER_SITE_OTHER)
Admission: RE | Admit: 2013-05-14 | Discharge: 2013-05-14 | Disposition: A | Discharge status: Home / Self Care | Payer: Managed Care, Other (non HMO) | Source: Ambulatory Visit | Attending: Family Medicine | Admitting: Family Medicine

## 2013-05-14 VITALS — BP 120/86 | HR 87 | Temp 98.2°F | Ht <= 58 in | Wt 120.0 lb

## 2013-05-14 DIAGNOSIS — M810 Age-related osteoporosis without current pathological fracture: Secondary | ICD-10-CM

## 2013-05-14 DIAGNOSIS — M25532 Pain in left wrist: Secondary | ICD-10-CM

## 2013-05-14 DIAGNOSIS — M25539 Pain in unspecified wrist: Secondary | ICD-10-CM

## 2013-05-14 MED ORDER — MELOXICAM 15 MG PO TABS: 15.0000 mg | ORAL_TABLET | Freq: Every day | ORAL | Status: DC

## 2013-05-14 NOTE — Progress Notes (Signed)
Pre visit review using our clinic review tool, if applicable. No additional management support is needed unless otherwise documented below in the visit note. 

## 2013-05-14 NOTE — Assessment & Plan Note (Signed)
Given osteoporosis and age.. eval for fracture.  IF negative... Treat with meloxicam, wrist brace given, home ROM exercises and follow up in 2 weeks.

## 2013-05-14 NOTE — Progress Notes (Signed)
   Subjective:    Patient ID: Brianna Barnes, female    DOB: [birth year], 80 y.o.   MRN: [id number]  Wrist Pain  Pertinent negatives include no fever.    80 year old female with history of osteoporosis presents with  3 weeks of left wrist pain.  No sure about injury. No fall. Pain greatest over left lateral ulnar wrist... Runs up arm at times.  Pain with twisting, flexing and lifting. No numbness. No redness, no swelling. Has tried tylenol for pain, helps a little temporarily.  Hx of fracture of left wrist 15 years ago.. Since then limited ROM.     Review of Systems  Constitutional: Negative for fever and fatigue.  HENT: Negative for ear pain.   Eyes: Negative for pain.  Respiratory: Negative for chest tightness and shortness of breath.   Cardiovascular: Negative for chest pain, palpitations and leg swelling.  Gastrointestinal: Negative for abdominal pain.  Genitourinary: Negative for dysuria.       Objective:   Physical Exam  Constitutional: Vital signs are normal. She appears well-developed and well-nourished. She is cooperative.  Non-toxic appearance. She does not appear ill. No distress.  HENT:  Head: Normocephalic.  Right Ear: Hearing, tympanic membrane, external ear and ear canal normal. Tympanic membrane is not erythematous, not retracted and not bulging.  Left Ear: Hearing, tympanic membrane, external ear and ear canal normal. Tympanic membrane is not erythematous, not retracted and not bulging.  Nose: No mucosal edema or rhinorrhea. Right sinus exhibits no maxillary sinus tenderness and no frontal sinus tenderness. Left sinus exhibits no maxillary sinus tenderness and no frontal sinus tenderness.  Mouth/Throat: Uvula is midline, oropharynx is clear and moist and mucous membranes are normal.  Eyes: Conjunctivae, EOM and lids are normal. Pupils are equal, round, and reactive to light. Lids are everted and swept, no foreign bodies found.  Neck: Trachea normal and normal  range of motion. Neck supple. Carotid bruit is not present. No mass and no thyromegaly present.  Cardiovascular: Normal rate, regular rhythm, S1 normal, S2 normal, normal heart sounds, intact distal pulses and normal pulses.  Exam reveals no gallop and no friction rub.   No murmur heard. Pulmonary/Chest: Effort normal and breath sounds normal. Not tachypneic. No respiratory distress. She has no decreased breath sounds. She has no wheezes. She has no rhonchi. She has no rales.  Abdominal: Soft. Normal appearance and bowel sounds are normal. There is no tenderness.  Musculoskeletal:       Left elbow: Normal.       Left wrist: She exhibits decreased range of motion, tenderness and bony tenderness. She exhibits no swelling and no effusion.       Left hand: Normal.  Neurological: She is alert.  Skin: Skin is warm, dry and intact. No rash noted.  Psychiatric: Her speech is normal and behavior is normal. Judgment and thought content normal. Her mood appears not anxious. Cognition and memory are normal. She does not exhibit a depressed mood.          Assessment & Plan:

## 2013-05-14 NOTE — Patient Instructions (Addendum)
We will call with X-ray results.  Start meloxicam for pain and inflammation daily. While on this increase prilose to 2 x 20 mg tabs daily. If no fracture start home ROM exercises. Wear brace. Follow up in 2 weeks.

## 2013-06-01 ENCOUNTER — Ambulatory Visit: Payer: Managed Care, Other (non HMO) | Admitting: Family Medicine

## 2013-07-17 ENCOUNTER — Encounter: Payer: Self-pay | Admitting: Internal Medicine

## 2013-07-20 ENCOUNTER — Encounter: Payer: Self-pay | Admitting: Family Medicine

## 2013-07-20 ENCOUNTER — Telehealth: Payer: Self-pay | Admitting: Family Medicine

## 2013-07-20 ENCOUNTER — Ambulatory Visit (INDEPENDENT_AMBULATORY_CARE_PROVIDER_SITE_OTHER): Payer: Managed Care, Other (non HMO) | Admitting: Family Medicine

## 2013-07-20 VITALS — BP 140/76 | HR 81 | Temp 98.1°F | Ht <= 58 in | Wt 118.5 lb

## 2013-07-20 DIAGNOSIS — I10 Essential (primary) hypertension: Secondary | ICD-10-CM

## 2013-07-20 DIAGNOSIS — E785 Hyperlipidemia, unspecified: Secondary | ICD-10-CM

## 2013-07-20 NOTE — Telephone Encounter (Signed)
Relevant patient education assigned to patient using Emmi. ° °

## 2013-07-20 NOTE — Progress Notes (Signed)
Pre visit review using our clinic review tool, if applicable. No additional management support is needed unless otherwise documented below in the visit note. 

## 2013-07-20 NOTE — Assessment & Plan Note (Signed)
Well controlled at last check. Encouraged exercise, weight loss, healthy eating habits.

## 2013-07-20 NOTE — Patient Instructions (Signed)
Keep on eating healthy foods, walking. Occasionally check blood pressure... BP goal < 140/90. Call if any concerns.    Managing Your High Blood Pressure Blood pressure is a measurement of how forceful your blood is pressing against the walls of the arteries. Arteries are muscular tubes within the circulatory system. Blood pressure does not stay the same. Blood pressure rises when you are active, excited, or nervous; and it lowers during sleep and relaxation. If the numbers measuring your blood pressure stay above normal most of the time, you are at risk for health problems. High blood pressure (hypertension) is a long-term (chronic) condition in which blood pressure is elevated. A blood pressure reading is recorded as two numbers, such as 120 over 80 (or 120/80). The first, higher number is called the systolic pressure. It is a measure of the pressure in your arteries as the heart beats. The second, lower number is called the diastolic pressure. It is a measure of the pressure in your arteries as the heart relaxes between beats.  Keeping your blood pressure in a normal range is important to your overall health and prevention of health problems, such as heart disease and stroke. When your blood pressure is uncontrolled, your heart has to work harder than normal. High blood pressure is a very common condition in adults because blood pressure tends to rise with age. Men and women are equally likely to have hypertension but at different times in life. Before age 40, men are more likely to have hypertension. After 78 years of age, women are more likely to have it. Hypertension is especially common in African Americans. This condition often has no signs or symptoms. The cause of the condition is usually not known. Your caregiver can help you come up with a plan to keep your blood pressure in a normal, healthy range. BLOOD PRESSURE STAGES Blood pressure is classified into four stages: normal, prehypertension,  stage 1, and stage 2. Your blood pressure reading will be used to determine what type of treatment, if any, is necessary. Appropriate treatment options are tied to these four stages:  Normal  Systolic pressure (mm Hg): below 120.  Diastolic pressure (mm Hg): below 80. Prehypertension  Systolic pressure (mm Hg): 120 to 139.  Diastolic pressure (mm Hg): 80 to 89. Stage1  Systolic pressure (mm Hg): 140 to 159.  Diastolic pressure (mm Hg): 90 to 99. Stage2  Systolic pressure (mm Hg): 160 or above.  Diastolic pressure (mm Hg): 100 or above. RISKS RELATED TO HIGH BLOOD PRESSURE Managing your blood pressure is an important responsibility. Uncontrolled high blood pressure can lead to:  A heart attack.  A stroke.  A weakened blood vessel (aneurysm).  Heart failure.  Kidney damage.  Eye damage.  Metabolic syndrome.  Memory and concentration problems. HOW TO MANAGE YOUR BLOOD PRESSURE Blood pressure can be managed effectively with lifestyle changes and medicines (if needed). Your caregiver will help you come up with a plan to bring your blood pressure within a normal range. Your plan should include the following: Education  Read all information provided by your caregivers about how to control blood pressure.  Educate yourself on the latest guidelines and treatment recommendations. New research is always being done to further define the risks and treatments for high blood pressure. Lifestylechanges  Control your weight.  Avoid smoking.  Stay physically active.  Reduce the amount of salt in your diet.  Reduce stress.  Control any chronic conditions, such as high cholesterol or diabetes.  Reduce  your alcohol intake. Medicines  Several medicines (antihypertensive medicines) are available, if needed, to bring blood pressure within a normal range. Communication  Review all the medicines you take with your caregiver because there may be side effects or  interactions.  Talk with your caregiver about your diet, exercise habits, and other lifestyle factors that may be contributing to high blood pressure.  See your caregiver regularly. Your caregiver can help you create and adjust your plan for managing high blood pressure. RECOMMENDATIONS FOR TREATMENT AND FOLLOW-UP  The following recommendations are based on current guidelines for managing high blood pressure in nonpregnant adults. Use these recommendations to identify the proper follow-up period or treatment option based on your blood pressure reading. You can discuss these options with your caregiver.  Systolic pressure of 357 to 017 or diastolic pressure of 80 to 89: Follow up with your caregiver as directed.  Systolic pressure of 793 to 903 or diastolic pressure of 90 to 100: Follow up with your caregiver within 2 months.  Systolic pressure above 009 or diastolic pressure above 233: Follow up with your caregiver within 1 month.  Systolic pressure above 007 or diastolic pressure above 622: Consider antihypertensive therapy; follow up with your caregiver within 1 week.  Systolic pressure above 633 or diastolic pressure above 354: Begin antihypertensive therapy; follow up with your caregiver within 1 week. Document Released: 11/24/2011 Document Reviewed: 11/24/2011 [facility] Patient Information 2014 [place], [place].

## 2013-07-20 NOTE — Progress Notes (Signed)
Cryptogenic cirrhosis: folled by Dr. Hilarie Fredrickson  After lab testing no genetic or other cause was found to explain her cirrhosis. Her liver disease remains very well compensated, without evidence for volume overload, jaundice, encephalopathy.  Choledocholithiasis status post ERCP With sphincterotomy and removal of CBD stone on 11/30/2012  Liver function tets improved after procedure.  Followed by Dr. Hilarie Fredrickson, GI.   Hypertension: Well controlled on no medication.  BP Readings from Last 3 Encounters:  07/20/13 140/76  05/14/13 120/86  04/02/13 146/78  Using medication without problems or lightheadedness: None Chest pain with exertion:None  Edema:none Short of breath:None  Average home BPs: not checking at home  Other issues: Sister age 69 diagnosed with CAD.Marland Kitchen Had CABG x 4.   Elevated Cholesterol: Well controlled previously on no medicaition.  Lab Results   Component  Value  Date    CHOL  173  01/08/2013    HDL  49.10  01/08/2013    LDLCALC  106*  01/08/2013    LDLDIRECT  135.2  06/06/2009    TRIG  90.0  01/08/2013    CHOLHDL  4  01/08/2013    Osteoporosis: On fosamax. Has been on this medication for 10 years.  Vit D def, resolved s/p supplementation with rx dosing. On twice daily 400 mg D3   Review of Systems  Constitutional: Negative for fever and fatigue.  HENT: Negative for ear pain.  Eyes: Negative for pain.  Respiratory: Negative for cough, shortness of breath and wheezing.  Cardiovascular: Negative for chest pain, palpitations and leg swelling.  Gastrointestinal: negative for constipation. Negative for nausea, abdominal pain, diarrhea and blood in stool.  Genitourinary: Negative for dysuria, vaginal bleeding and vaginal pain.  Skin: Negative for rash.  Neurological: Negative for dizziness, syncope and light-headedness.  Psychiatric/Behavioral: Negative for dysphoric mood. The patient is not nervous/anxious.  Objective:   Physical Exam  Constitutional: Vital signs are normal.  She appears well-developed and well-nourished. She is cooperative. Non-toxic appearance. She does not appear ill. No distress.  HENT:  Head: Normocephalic.  Right Ear: Hearing, tympanic membrane, external ear and ear canal normal.  Left Ear: Hearing, tympanic membrane, external ear and ear canal normal.  Nose: Nose normal.  Eyes: Conjunctivae, EOM and lids are normal. Pupils are equal, round, and reactive to light. No foreign bodies found.  Neck: Trachea normal and normal range of motion. Neck supple. Carotid bruit is not present. No mass and no thyromegaly present. Neg spurlings  Cardiovascular: Normal rate, regular rhythm, S1 normal, S2 normal, normal heart sounds and intact distal pulses. Exam reveals no gallop.  No murmur heard.  Pulmonary/Chest: Effort normal and breath sounds normal. No respiratory distress. She has no wheezes. She has no rhonchi. She has no rales.  Abdominal: Soft. Normal appearance and bowel sounds are normal. She exhibits no distension, no fluid wave, no abdominal bruit and no mass. There is no hepatosplenomegaly. There is no tenderness. There is no rebound, no guarding and no CVA tenderness. No hernia.  Lymphadenopathy:  She has no cervical adenopathy.  She has no axillary adenopathy.  Neurological: She is alert. She has normal strength. No cranial nerve deficit or sensory deficit.  Skin: Skin is warm, dry and intact. No rash noted.  Psychiatric: Her speech is normal and behavior is normal. Judgment normal. Her mood appears not anxious. Cognition and memory are normal. She does not exhibit a depressed mood.

## 2013-07-20 NOTE — Assessment & Plan Note (Signed)
Well controlled (slightly borderline control, follow at home). Continue current medication.

## 2013-08-13 ENCOUNTER — Other Ambulatory Visit: Payer: Self-pay | Admitting: Family Medicine

## 2013-08-27 ENCOUNTER — Other Ambulatory Visit: Payer: Self-pay | Admitting: Urology

## 2013-09-07 ENCOUNTER — Encounter (HOSPITAL_BASED_OUTPATIENT_CLINIC_OR_DEPARTMENT_OTHER): Payer: Self-pay | Admitting: *Deleted

## 2013-09-10 ENCOUNTER — Encounter (HOSPITAL_BASED_OUTPATIENT_CLINIC_OR_DEPARTMENT_OTHER): Payer: Self-pay | Admitting: *Deleted

## 2013-09-10 NOTE — Progress Notes (Signed)
SPOKE W/ PT DAUGHTER, [place], PT VERY HOH.  NPO AFTER MN. ARRIVE AT 0700. NEEDS HG. WILL TAKE PRILOSEC AM DOS W/ SIPS OF WATER.

## 2013-09-12 ENCOUNTER — Encounter: Payer: Self-pay | Admitting: Internal Medicine

## 2013-09-13 ENCOUNTER — Encounter (HOSPITAL_BASED_OUTPATIENT_CLINIC_OR_DEPARTMENT_OTHER): Payer: Medicare HMO | Admitting: Anesthesiology

## 2013-09-13 ENCOUNTER — Ambulatory Visit (HOSPITAL_BASED_OUTPATIENT_CLINIC_OR_DEPARTMENT_OTHER)
Admission: RE | Admit: 2013-09-13 | Discharge: 2013-09-13 | Disposition: A | Discharge status: Home / Self Care | Payer: Medicare HMO | Source: Ambulatory Visit | Attending: Urology | Admitting: Urology

## 2013-09-13 ENCOUNTER — Encounter (HOSPITAL_BASED_OUTPATIENT_CLINIC_OR_DEPARTMENT_OTHER)
Admission: RE | Disposition: A | Discharge status: Home / Self Care | Payer: Self-pay | Source: Ambulatory Visit | Attending: Urology

## 2013-09-13 ENCOUNTER — Ambulatory Visit (HOSPITAL_BASED_OUTPATIENT_CLINIC_OR_DEPARTMENT_OTHER): Payer: Medicare HMO | Admitting: Anesthesiology

## 2013-09-13 ENCOUNTER — Encounter (HOSPITAL_BASED_OUTPATIENT_CLINIC_OR_DEPARTMENT_OTHER): Payer: Self-pay | Admitting: Anesthesiology

## 2013-09-13 DIAGNOSIS — IMO0002 Reserved for concepts with insufficient information to code with codable children: Secondary | ICD-10-CM | POA: Insufficient documentation

## 2013-09-13 DIAGNOSIS — K219 Gastro-esophageal reflux disease without esophagitis: Secondary | ICD-10-CM | POA: Insufficient documentation

## 2013-09-13 DIAGNOSIS — I10 Essential (primary) hypertension: Secondary | ICD-10-CM | POA: Insufficient documentation

## 2013-09-13 DIAGNOSIS — T8389XA Other specified complication of genitourinary prosthetic devices, implants and grafts, initial encounter: Secondary | ICD-10-CM | POA: Insufficient documentation

## 2013-09-13 DIAGNOSIS — K279 Peptic ulcer, site unspecified, unspecified as acute or chronic, without hemorrhage or perforation: Secondary | ICD-10-CM | POA: Insufficient documentation

## 2013-09-13 DIAGNOSIS — K7460 Unspecified cirrhosis of liver: Secondary | ICD-10-CM | POA: Insufficient documentation

## 2013-09-13 DIAGNOSIS — N812 Incomplete uterovaginal prolapse: Secondary | ICD-10-CM

## 2013-09-13 HISTORY — DX: Personal history of colon polyps, unspecified: Z86.0100

## 2013-09-13 HISTORY — DX: Peptic ulcer, site unspecified, unspecified as acute or chronic, without hemorrhage or perforation: K27.9

## 2013-09-13 HISTORY — DX: Unspecified hearing loss, unspecified ear: H91.90

## 2013-09-13 HISTORY — DX: Portal hypertension: K76.6

## 2013-09-13 HISTORY — DX: Presence of external hearing-aid: Z97.4

## 2013-09-13 HISTORY — DX: Other specified postprocedural states: Z98.890

## 2013-09-13 HISTORY — PX: REMOVAL OF URINARY SLING: SHX6218

## 2013-09-13 HISTORY — DX: Iron deficiency anemia, unspecified: D50.9

## 2013-09-13 HISTORY — DX: Hyperlipidemia, unspecified: E78.5

## 2013-09-13 HISTORY — DX: Presence of spectacles and contact lenses: Z97.3

## 2013-09-13 HISTORY — DX: Incomplete uterovaginal prolapse: N81.2

## 2013-09-13 HISTORY — DX: Other specified postprocedural states: Z86.010

## 2013-09-13 HISTORY — DX: Personal history of other medical treatment: Z92.89

## 2013-09-13 HISTORY — DX: Other cirrhosis of liver: K74.69

## 2013-09-13 SURGERY — REMOVAL, URETHRAL SLING, FOR STRESS INCONTINENCE
Anesthesia: Monitor Anesthesia Care | Site: Vagina

## 2013-09-13 MED ORDER — FENTANYL CITRATE 0.05 MG/ML IJ SOLN
INTRAMUSCULAR | Status: AC
  Filled 2013-09-13: qty 4

## 2013-09-13 MED ORDER — FENTANYL CITRATE 0.05 MG/ML IJ SOLN
INTRAMUSCULAR | Status: DC | PRN
  Administered 2013-09-13: 50 ug via INTRAVENOUS

## 2013-09-13 MED ORDER — PROPOFOL 10 MG/ML IV EMUL
INTRAVENOUS | Status: DC | PRN
  Administered 2013-09-13: 120 ug/kg/min via INTRAVENOUS

## 2013-09-13 MED ORDER — LACTATED RINGERS IV SOLN
INTRAVENOUS | Status: DC
  Administered 2013-09-13: 07:00:00 via INTRAVENOUS
  Filled 2013-09-13: qty 1000

## 2013-09-13 MED ORDER — SODIUM CHLORIDE 0.9 % IR SOLN
Status: DC | PRN
  Administered 2013-09-13: 500 mL

## 2013-09-13 MED ORDER — FENTANYL CITRATE 0.05 MG/ML IJ SOLN
25.0000 ug | INTRAMUSCULAR | Status: DC | PRN
  Filled 2013-09-13: qty 1

## 2013-09-13 MED ORDER — LACTATED RINGERS IV SOLN
INTRAVENOUS | Status: DC
  Filled 2013-09-13: qty 1000

## 2013-09-13 MED ORDER — BACITRACIN-NEOMYCIN-POLYMYXIN OINTMENT TUBE
TOPICAL_OINTMENT | CUTANEOUS | Status: DC | PRN
  Administered 2013-09-13: 1 via TOPICAL

## 2013-09-13 SURGICAL SUPPLY — 14 items
BAG DRAIN CYSTO-URO STER (DRAIN) ×2 IMPLANT
CANISTER SUCT LVC 12 LTR MEDI- (MISCELLANEOUS) ×2 IMPLANT
CANISTER SUCTION 1200CC (MISCELLANEOUS) ×2 IMPLANT
GLOVE BIO SURGEON STRL SZ8 (GLOVE) ×3 IMPLANT
GLOVE BIOGEL PI IND STRL 6.5 (GLOVE) IMPLANT
GLOVE BIOGEL PI INDICATOR 6.5 (GLOVE) ×2
GLOVE INDICATOR 6.5 STRL GRN (GLOVE) ×2 IMPLANT
GOWN STRL REUS W/ TWL XL LVL3 (GOWN DISPOSABLE) IMPLANT
GOWN STRL REUS W/TWL XL LVL3 (GOWN DISPOSABLE) ×6
NS IRRIG 500ML POUR BTL (IV SOLUTION) ×2 IMPLANT
PACK CYSTOSCOPY (CUSTOM PROCEDURE TRAY) ×3 IMPLANT
PACKING VAGINAL (PACKING) ×2 IMPLANT
SET BERKELEY SUCTION TUBING (SUCTIONS) ×2 IMPLANT
YANKAUER SUCT BULB TIP NO VENT (SUCTIONS) ×2 IMPLANT

## 2013-09-13 NOTE — Anesthesia Postprocedure Evaluation (Signed)
  Anesthesia Post-op Note  Patient: Brianna Barnes  Procedure(s) Performed: Procedure(s) (LRB): REMOVAL OF RETAINED PESSARY (N/A)  Patient Location: PACU  Anesthesia Type: MAC  Level of Consciousness: awake and alert   Airway and Oxygen Therapy: Patient Spontanous Breathing  Post-op Pain: mild  Post-op Assessment: Post-op Vital signs reviewed, Patient's Cardiovascular Status Stable, Respiratory Function Stable, Patent Airway and No signs of Nausea or vomiting  Last Vitals:  Filed Vitals:   09/13/13 0945  BP: 148/78  Pulse: 71  Temp: 36.1 C  Resp: 16    Post-op Vital Signs: stable   Complications: No apparent anesthesia complications

## 2013-09-13 NOTE — Transfer of Care (Signed)
Immediate Anesthesia Transfer of Care Note  Patient: Brianna Barnes  Procedure(s) Performed: Procedure(s) (LRB): REMOVAL OF RETAINED PESSARY (N/A)  Patient Location: PACU  Anesthesia Type: MAC  Level of Consciousness: awake, alert , oriented and patient cooperative  Airway & Oxygen Therapy: Patient Spontanous Breathing and Patient connected to face mask oxygen  Post-op Assessment: Report given to PACU RN and Post -op Vital signs reviewed and stable  Post vital signs: Reviewed and stable  Complications: No apparent anesthesia complications

## 2013-09-13 NOTE — Op Note (Signed)
PATIENT:  Brianna Barnes  PRE-OPERATIVE DIAGNOSIS: Retained pessary  POST-OPERATIVE DIAGNOSIS: Same  PROCEDURE: Removal of pessary  SURGEON:  Lillette Boxer. Jakeb Lamping, M.D.  ANESTHESIA:  Sedation  EBL:  Minimal  DRAINS: None  LOCAL MEDICATIONS USED:  None  SPECIMEN:  Pessary  INDICATION: Brianna Barnes is  an 80 year old female with pelvic prolapse. This is been treated for years with a flexible Gellhorn pessary. Recent office visit for routine pessary check revealed that the pessary was retained. I was unable to extract this. The patient had significant discomfort with attempts. She presents at this time for removal under sedation.  Description of procedure: The patient was properly identified and marked (if applicable) in the holding area. They were then  taken to the operating room and placed on the table in a supine position. General anesthesia was then administered. Once fully anesthetized the patient was moved to the dorsolithotomy position and the genitalia and perineum were sterilely prepped and draped in standard fashion. An official timeout was then performed.  I grasped the tip of the pessary with a Kocher clamp. With a bit of difficulty, I was eventually able, with placing my finger around the base of the pessary, to extract the pessary. This was associated with a minor amount of bleeding and some mild tearing of the vaginal epithelium. This was not significant, however. I irrigated the vagina copiously with saline following this. I then placed a vaginal pack with triple antibiotic cream.  She tolerated procedure well and was taken to the PACU in stable condition.    PLAN OF CARE: Discharge to home after PACU  PATIENT DISPOSITION:  PACU - hemodynamically stable.

## 2013-09-13 NOTE — Discharge Instructions (Signed)
I placed a small amount of gauze packing in your vagina. This essentially works as a tampon. It has some antibiotic cream on it. This will decrease the amount of bloody drainage that you have for the next day or so. It is okay to remove that on Friday morning.   If you have any concerns or questions prior to your followup, call my office at [phone].  Have a great Fourth of July! Post Anesthesia Home Care Instructions  Activity: Get plenty of rest for the remainder of the day. A responsible adult should stay with you for 24 hours following the procedure.  For the next 24 hours, DO NOT: -Drive a car -Paediatric nurse -Drink alcoholic beverages -Take any medication unless instructed by your physician -Make any legal decisions or sign important papers.  Meals: Start with liquid foods such as gelatin or soup. Progress to regular foods as tolerated. Avoid greasy, spicy, heavy foods. If nausea and/or vomiting occur, drink only clear liquids until the nausea and/or vomiting subsides. Call your physician if vomiting continues.  Special Instructions/Symptoms: Your throat may feel dry or sore from the anesthesia or the breathing tube placed in your throat during surgery. If this causes discomfort, gargle with warm salt water. The discomfort should disappear within 24 hours.

## 2013-09-13 NOTE — Anesthesia Preprocedure Evaluation (Addendum)
Anesthesia Evaluation  Patient identified by MRN, date of birth, ID band Patient awake    Reviewed: Allergy & Precautions, H&P , NPO status , Patient's Chart, lab work & pertinent test results  Airway Mallampati: II TM Distance: >3 FB Neck ROM: full    Dental  (+) Edentulous Upper, Edentulous Lower, Dental Advisory Given   Pulmonary neg pulmonary ROS,  breath sounds clear to auscultation  Pulmonary exam normal       Cardiovascular Exercise Tolerance: Good hypertension, Rhythm:regular Rate:Normal  AVM   Neuro/Psych negative neurological ROS  negative psych ROS   GI/Hepatic negative GI ROS, PUD, GERD-  Medicated and Controlled,(+) Cirrhosis -      ,   Endo/Other  negative endocrine ROSprediabetes  Renal/GU negative Renal ROS  negative genitourinary   Musculoskeletal   Abdominal   Peds  Hematology negative hematology ROS (+)   Anesthesia Other Findings   Reproductive/Obstetrics negative OB ROS                          Anesthesia Physical Anesthesia Plan  ASA: III  Anesthesia Plan: MAC   Post-op Pain Management:    Induction:   Airway Management Planned: Simple Face Mask  Additional Equipment:   Intra-op Plan:   Post-operative Plan:   Informed Consent: I have reviewed the patients History and Physical, chart, labs and discussed the procedure including the risks, benefits and alternatives for the proposed anesthesia with the patient or authorized representative who has indicated his/her understanding and acceptance.   Dental Advisory Given  Plan Discussed with: CRNA and Surgeon  Anesthesia Plan Comments:         Anesthesia Quick Evaluation

## 2013-09-13 NOTE — H&P (Signed)
  H&P  Chief Complaint: Retained pessary  History of Present Illness: Brianna Barnes is a 80 y.o. year old female who has been using a pessary-a flexible Gellhorn-for several years. She gets this changed every 4-6 months. It is cleansed in the office and replaced. At her recent office visit, I was unable to remove the pessary. Because it was quite uncomfortable the patient, I thought it worthwhile to remove this pessary under sedation, and perhaps down the Road changed type of pessary use. She presents at this time for that procedure.  Past Medical History  Diagnosis Date  . Osteoarthritis     hands, knees, and low back  . GERD (gastroesophageal reflux disease)   . Osteoporosis   . History of positive PPD     + TB SKIN TEST WITHOUT TB  . PUD (peptic ulcer disease)   . History of colonoscopy with polypectomy     ADENOMATOUS AND HYPERPLASIA POLYPS  (2008  &  2010)  . Cirrhosis, cryptogenic     FOLLOWED BY DR PYRTLE-- LOV 04-02-2013 IN EPIC--  STABLE PER NOTE  . Iron deficiency anemia   . Uterovaginal prolapse, incomplete   . Hypertension, portal     SECONDARY TO CRYPTOGENIC CIRRHOSIS  . Hyperlipidemia   . Wears glasses   . Wears hearing aid     left ear only  . Loss of hearing     right ear    Past Surgical History  Procedure Laterality Date  . External ear surgery Left 1980  . Cholecystectomy  08/25/2011    Procedure: LAPAROSCOPIC CHOLECYSTECTOMY WITH INTRAOPERATIVE CHOLANGIOGRAM;  Surgeon: Zenovia Jarred, MD;  Location: [facility];  Service: General;  Laterality: N/A;  . Endoscopic retrograde cholangiopancreatography (ercp) with propofol N/A 11/30/2012    Procedure: ENDOSCOPIC RETROGRADE CHOLANGIOPANCREATOGRAPHY (ERCP) WITH PROPOFOL;  Surgeon: Milus Banister, MD;  Location: [facility];  Service: Endoscopy;  Laterality: N/A;  . Lumbar disc surgery  1990  . Appendectomy  1960'S  . Knee arthroscopy w/ meniscectomy Right 06-19-2002    Home Medications:  No prescriptions prior to  admission    Allergies: No Known Allergies  Family History  Problem Relation Age of Onset  . Heart disease Mother   . Heart disease Father 55  . Hyperlipidemia Father   . Diabetes Father   . Cirrhosis Sister   . Diabetes Brother   . Heart disease Brother     Social History:  reports that she has never smoked. She has never used smokeless tobacco. She reports that she does not drink alcohol or use illicit drugs.  ROS: A complete review of systems was performed.  All systems are negative except for pertinent findings as noted.  Physical Exam:  Vital signs in last 24 hours:   General:  Alert and oriented, No acute distress HEENT: Normocephalic, atraumatic Neck: No JVD or lymphadenopathy Cardiovascular: Regular rate and rhythm Lungs: Clear bilaterally Abdomen: Soft, nontender, nondistended, no abdominal masses Back: No CVA tenderness Extremities: No edema Neurologic: Grossly intact  Laboratory Data:  No results found for this or any previous visit (from the past 24 hour(s)). No results found for this or any previous visit (from the past 240 hour(s)). Creatinine: No results found for this basename: CREATININE,  in the last 168 hours  Radiologic Imaging: No results found.  Impression/Assessment:  Retained pessary-this is used for pelvic prolapse  Plan:  Removal of pessary under sedation.  Jorja Loa 09/13/2013, 12:42 AM  Lillette Boxer. Keonte Daubenspeck MD

## 2013-09-17 ENCOUNTER — Encounter (HOSPITAL_BASED_OUTPATIENT_CLINIC_OR_DEPARTMENT_OTHER): Payer: Self-pay | Admitting: Urology

## 2013-09-17 LAB — POCT HEMOGLOBIN-HEMACUE: Hemoglobin: 12.9 g/dL (ref 12.0–15.0)

## 2013-09-18 ENCOUNTER — Ambulatory Visit (INDEPENDENT_AMBULATORY_CARE_PROVIDER_SITE_OTHER): Payer: Medicare HMO | Admitting: Internal Medicine

## 2013-09-18 ENCOUNTER — Encounter: Payer: Self-pay | Admitting: Internal Medicine

## 2013-09-18 VITALS — BP 112/64 | HR 70 | Ht <= 58 in | Wt 120.0 lb

## 2013-09-18 DIAGNOSIS — K7469 Other cirrhosis of liver: Secondary | ICD-10-CM

## 2013-09-18 DIAGNOSIS — R1032 Left lower quadrant pain: Secondary | ICD-10-CM

## 2013-09-18 DIAGNOSIS — K7460 Unspecified cirrhosis of liver: Secondary | ICD-10-CM

## 2013-09-18 DIAGNOSIS — K219 Gastro-esophageal reflux disease without esophagitis: Secondary | ICD-10-CM

## 2013-09-18 NOTE — Patient Instructions (Signed)
Your physician has requested that you go to the basement for  lab work before leaving today  You have been scheduled for an abdominal ultrasound at [facility] (1st floor of hospital) on 11/22/2013 at 8:00am. Please arrive 15 minutes prior to your appointment for registration. Make certain not to have anything to eat or drink 6 hours prior to your appointment. Should you need to reschedule your appointment, please contact radiology at [phone]. This test typically takes about 30 minutes to perform.

## 2013-09-18 NOTE — Progress Notes (Signed)
Subjective:    Patient ID: Brianna Barnes, female    DOB: Sep 16, 1928, 80 y.o.   MRN: [id number]  HPI Brianna Barnes is an 80 year old female with a past medical history of cryptogenic cirrhosis with portal hypertension (small varices and splint megaly), CBD stone status post ERCP with stone extraction, osteoarthritis and GERD who is seen in followup. She is here today with her daughter. She reports she is feeling well. She had a pessary removed last week and has followup scheduled with Dr. Diona Fanti.  She denies hepatobiliary complaints including jaundice, itching. No abdominal swelling or lower extremity edema. Denies confusion or sleepiness. She does occasionally have fleeting left lower quadrant abdominal discomfort which lasts a few seconds. This can be sharp in nature and resolves spontaneously. It does not interfere with her activity. Normal bowel movements which are soft and formed occurring on a daily basis. She denies blood in her stool or melena. No fevers or chills. Appetite has been good she has gained 5 pounds since last visit here. She continues to take daily omeprazole which completely controls her heartburn. She denies trouble swallowing or painful swallowing.   Review of Systems As per history of present illness, otherwise negative  Current Medications, Allergies, Past Medical History, Past Surgical History, Family History and Social History were reviewed in [employer] record.      Objective:   Physical Exam BP 112/64  Pulse 70  Ht 4\' 10"  (1.473 m)  Wt 120 lb (54.432 kg)  BMI 25.09 kg/m2 Constitutional: Well-developed and well-nourished. No distress.  HEENT: Normocephalic and atraumatic. Oropharynx is clear and moist. No oropharyngeal exudate. Conjunctivae are normal. No scleral icterus.  Cardiovascular: Normal rate, regular rhythm and intact distal pulses.  Pulmonary/chest: Effort normal and breath sounds normal. No wheezing, rales or rhonchi.    Abdominal: Soft, nontender, nondistended. Bowel sounds active throughout. There are no masses palpable.  Extremities: no clubbing, cyanosis, or edema  Neurological: Alert and oriented to person place and time. No asterixis Skin: Skin is warm and dry. No rashes noted.  Psychiatric: Normal mood and affect. Behavior is normal.   Labs pending today  MRI/MRCP from Sept 2014 reviewed, no liver lesions (HCC screening)   ANA neg, IGG normal  Alpha one - normal  Hep A immune  Hep B neg, not immune  Hep C neg  Anti-smooth muscle - negative  Ceruloplasmin - normal  Ferritin - normal      Assessment & Plan:  80 year old female with a past medical history of cryptogenic cirrhosis with portal hypertension (small varices and splint megaly), CBD stone status post ERCP with stone extraction, osteoarthritis and GERD who is seen in followup.  1. Cryptogenic cirrhosis -- liver disease remains very well compensated without evidence for ascites or lower extremity edema. No jaundice. We are continuing supportive care with conservative management. She is aware of the clinical manifestations of decompensation and will notify me should this occur. --Variceal screening -- up-to-date, small varices were seen. Beta blocker deferred due to fall risk. --[place] screening, MRI September 2014, I recommended ultrasound to be performed in September --Vaccination -- annual flu vaccine recommended, hep A immune, hep B nonimmune but low risk, vaccine deferred --Labs today, CBC, CMP and INR  2. LLQ pain -- sporadic and very short lived. Unclear etiology. No clinical evidence for diverticulitis or colitis. We'll monitor for now. I asked that she notify me if this pain continues or worsens in anyway. She voices understanding.  3.  GERD --  well-controlled without alarm symptoms. Continue omeprazole 20 mg daily  Followup in 6 months, ultrasound in September

## 2013-09-21 ENCOUNTER — Other Ambulatory Visit (INDEPENDENT_AMBULATORY_CARE_PROVIDER_SITE_OTHER): Payer: Medicare HMO

## 2013-09-21 DIAGNOSIS — K7460 Unspecified cirrhosis of liver: Secondary | ICD-10-CM

## 2013-09-21 DIAGNOSIS — K7469 Other cirrhosis of liver: Secondary | ICD-10-CM

## 2013-09-21 LAB — COMPREHENSIVE METABOLIC PANEL
ALK PHOS: 92 U/L (ref 39–117)
ALT: 21 U/L (ref 0–35)
AST: 37 U/L (ref 0–37)
Albumin: 3.3 g/dL — ABNORMAL LOW (ref 3.5–5.2)
BILIRUBIN TOTAL: 0.7 mg/dL (ref 0.2–1.2)
BUN: 9 mg/dL (ref 6–23)
CO2: 28 mEq/L (ref 19–32)
CREATININE: 0.6 mg/dL (ref 0.4–1.2)
Calcium: 9.2 mg/dL (ref 8.4–10.5)
Chloride: 102 mEq/L (ref 96–112)
GFR: 99.62 mL/min (ref >60.00)
Glucose, Bld: 101 mg/dL — ABNORMAL HIGH (ref 70–99)
Potassium: 4.6 mEq/L (ref 3.5–5.1)
Sodium: 135 mEq/L (ref 135–145)
Total Protein: 5.7 g/dL — ABNORMAL LOW (ref 6.0–8.3)

## 2013-09-21 LAB — PROTIME-INR
INR: 1 ratio (ref 0.8–1.0)
Prothrombin Time: 11.3 s (ref 9.6–13.1)

## 2013-09-21 LAB — CBC WITH DIFFERENTIAL/PLATELET
BASOS ABS: 0 10*3/uL (ref 0.0–0.1)
Basophils Relative: 0.4 % (ref 0.0–3.0)
EOS ABS: 0.1 10*3/uL (ref 0.0–0.7)
Eosinophils Relative: 3.2 % (ref 0.0–5.0)
HEMATOCRIT: 37.9 % (ref 36.0–46.0)
HEMOGLOBIN: 12.7 g/dL (ref 12.0–15.0)
LYMPHS ABS: 1.2 10*3/uL (ref 0.7–4.0)
LYMPHS PCT: 27 % (ref 12.0–46.0)
MCHC: 33.5 g/dL (ref 30.0–36.0)
MCV: 90.3 fl (ref 78.0–100.0)
Monocytes Absolute: 0.6 10*3/uL (ref 0.1–1.0)
Monocytes Relative: 12.5 % — ABNORMAL HIGH (ref 3.0–12.0)
NEUTROS ABS: 2.5 10*3/uL (ref 1.4–7.7)
Neutrophils Relative %: 56.9 % (ref 43.0–77.0)
Platelets: 166 10*3/uL (ref 150.0–400.0)
RBC: 4.19 Mil/uL (ref 3.87–5.11)
RDW: 14.1 % (ref 11.5–15.5)
WBC: 4.5 10*3/uL (ref 4.0–10.5)

## 2013-11-22 ENCOUNTER — Ambulatory Visit (HOSPITAL_COMMUNITY)
Admission: RE | Admit: 2013-11-22 | Discharge: 2013-11-22 | Disposition: A | Discharge status: Home / Self Care | Payer: Medicare HMO | Source: Ambulatory Visit | Attending: Internal Medicine | Admitting: Internal Medicine

## 2013-11-22 DIAGNOSIS — R143 Flatulence: Secondary | ICD-10-CM | POA: Diagnosis not present

## 2013-11-22 DIAGNOSIS — K7469 Other cirrhosis of liver: Secondary | ICD-10-CM

## 2013-11-22 DIAGNOSIS — K7460 Unspecified cirrhosis of liver: Secondary | ICD-10-CM | POA: Insufficient documentation

## 2013-11-22 DIAGNOSIS — R141 Gas pain: Secondary | ICD-10-CM | POA: Diagnosis not present

## 2013-11-22 DIAGNOSIS — R142 Eructation: Secondary | ICD-10-CM

## 2013-12-21 ENCOUNTER — Other Ambulatory Visit: Payer: Self-pay | Admitting: Family Medicine

## 2013-12-21 DIAGNOSIS — Z1231 Encounter for screening mammogram for malignant neoplasm of breast: Secondary | ICD-10-CM

## 2013-12-28 ENCOUNTER — Other Ambulatory Visit: Payer: Self-pay

## 2014-01-04 ENCOUNTER — Other Ambulatory Visit: Payer: Self-pay | Admitting: Family Medicine

## 2014-01-10 ENCOUNTER — Ambulatory Visit (HOSPITAL_COMMUNITY)
Admission: RE | Admit: 2014-01-10 | Discharge: 2014-01-10 | Disposition: A | Discharge status: Home / Self Care | Payer: Medicare HMO | Source: Ambulatory Visit | Attending: Family Medicine | Admitting: Family Medicine

## 2014-01-10 DIAGNOSIS — Z1231 Encounter for screening mammogram for malignant neoplasm of breast: Secondary | ICD-10-CM | POA: Insufficient documentation

## 2014-01-18 ENCOUNTER — Telehealth: Payer: Self-pay | Admitting: Family Medicine

## 2014-01-18 ENCOUNTER — Other Ambulatory Visit (INDEPENDENT_AMBULATORY_CARE_PROVIDER_SITE_OTHER): Payer: Medicare HMO

## 2014-01-18 DIAGNOSIS — E785 Hyperlipidemia, unspecified: Secondary | ICD-10-CM

## 2014-01-18 DIAGNOSIS — E559 Vitamin D deficiency, unspecified: Secondary | ICD-10-CM

## 2014-01-18 DIAGNOSIS — M810 Age-related osteoporosis without current pathological fracture: Secondary | ICD-10-CM

## 2014-01-18 DIAGNOSIS — R7303 Prediabetes: Secondary | ICD-10-CM

## 2014-01-18 DIAGNOSIS — R7309 Other abnormal glucose: Secondary | ICD-10-CM

## 2014-01-18 DIAGNOSIS — D509 Iron deficiency anemia, unspecified: Secondary | ICD-10-CM

## 2014-01-18 LAB — CBC WITH DIFFERENTIAL/PLATELET
Basophils Absolute: 0 10*3/uL (ref 0.0–0.1)
Basophils Relative: 0.6 % (ref 0.0–3.0)
Eosinophils Absolute: 0.2 10*3/uL (ref 0.0–0.7)
Eosinophils Relative: 5.8 % — ABNORMAL HIGH (ref 0.0–5.0)
HCT: 39.7 % (ref 36.0–46.0)
HEMOGLOBIN: 13 g/dL (ref 12.0–15.0)
LYMPHS PCT: 32 % (ref 12.0–46.0)
Lymphs Abs: 1.1 10*3/uL (ref 0.7–4.0)
MCHC: 32.8 g/dL (ref 30.0–36.0)
MCV: 90.5 fl (ref 78.0–100.0)
MONOS PCT: 10.7 % (ref 3.0–12.0)
Monocytes Absolute: 0.4 10*3/uL (ref 0.1–1.0)
NEUTROS ABS: 1.8 10*3/uL (ref 1.4–7.7)
Neutrophils Relative %: 50.9 % (ref 43.0–77.0)
Platelets: 155 10*3/uL (ref 150.0–400.0)
RBC: 4.39 Mil/uL (ref 3.87–5.11)
RDW: 13.2 % (ref 11.5–15.5)
WBC: 3.5 10*3/uL — ABNORMAL LOW (ref 4.0–10.5)

## 2014-01-18 LAB — COMPREHENSIVE METABOLIC PANEL
ALBUMIN: 3 g/dL — AB (ref 3.5–5.2)
ALK PHOS: 111 U/L (ref 39–117)
ALT: 15 U/L (ref 0–35)
AST: 33 U/L (ref 0–37)
BUN: 9 mg/dL (ref 6–23)
CO2: 27 meq/L (ref 19–32)
Calcium: 9.3 mg/dL (ref 8.4–10.5)
Chloride: 107 mEq/L (ref 96–112)
Creatinine, Ser: 0.6 mg/dL (ref 0.4–1.2)
GFR: 99.54 mL/min (ref >60.00)
Glucose, Bld: 84 mg/dL (ref 70–99)
POTASSIUM: 4.2 meq/L (ref 3.5–5.1)
SODIUM: 141 meq/L (ref 135–145)
TOTAL PROTEIN: 5.8 g/dL — AB (ref 6.0–8.3)
Total Bilirubin: 0.6 mg/dL (ref 0.2–1.2)

## 2014-01-18 LAB — HEMOGLOBIN A1C: HEMOGLOBIN A1C: 4.9 % (ref 4.6–6.5)

## 2014-01-18 LAB — LIPID PANEL
CHOL/HDL RATIO: 4
Cholesterol: 197 mg/dL (ref 0–200)
HDL: 46.7 mg/dL (ref >39.00)
LDL Cholesterol: 136 mg/dL — ABNORMAL HIGH (ref 0–99)
NONHDL: 150.3
Triglycerides: 74 mg/dL (ref 0.0–149.0)
VLDL: 14.8 mg/dL (ref 0.0–40.0)

## 2014-01-18 LAB — VITAMIN D 25 HYDROXY (VIT D DEFICIENCY, FRACTURES): VITD: 36.52 ng/mL (ref 30.00–100.00)

## 2014-01-18 NOTE — Telephone Encounter (Signed)
-----   Message from Ellamae Sia sent at 01/09/2014  6:43 PM EDT ----- Regarding: Lab orders for Friday, 11.6.15 Patient is scheduled for CPX labs, please order future labs, Thanks , Karna Christmas

## 2014-01-25 ENCOUNTER — Ambulatory Visit (INDEPENDENT_AMBULATORY_CARE_PROVIDER_SITE_OTHER): Payer: Medicare HMO | Admitting: Family Medicine

## 2014-01-25 ENCOUNTER — Encounter: Payer: Self-pay | Admitting: Family Medicine

## 2014-01-25 VITALS — BP 140/60 | HR 77 | Temp 97.9°F | Ht 58.5 in | Wt 116.5 lb

## 2014-01-25 DIAGNOSIS — Z0000 Encounter for general adult medical examination without abnormal findings: Secondary | ICD-10-CM

## 2014-01-25 DIAGNOSIS — Z23 Encounter for immunization: Secondary | ICD-10-CM

## 2014-01-25 DIAGNOSIS — I10 Essential (primary) hypertension: Secondary | ICD-10-CM

## 2014-01-25 DIAGNOSIS — Z7189 Other specified counseling: Secondary | ICD-10-CM

## 2014-01-25 DIAGNOSIS — E46 Unspecified protein-calorie malnutrition: Secondary | ICD-10-CM

## 2014-01-25 DIAGNOSIS — M810 Age-related osteoporosis without current pathological fracture: Secondary | ICD-10-CM

## 2014-01-25 DIAGNOSIS — E785 Hyperlipidemia, unspecified: Secondary | ICD-10-CM

## 2014-01-25 MED ORDER — OMEPRAZOLE 20 MG PO CPDR: 20.0000 mg | DELAYED_RELEASE_CAPSULE | Freq: Every day | ORAL | Status: DC

## 2014-01-25 NOTE — Progress Notes (Signed)
I have personally reviewed the Medicare Annual Wellness questionnaire and have noted  1. The patient's medical and social history  2. Their use of alcohol, tobacco or illicit drugs  3. Their current medications and supplements  4. The patient's functional ability including ADL's, fall risks, home safety risks and hearing or visual  impairment.  5. Diet and physical activities  6. Evidence for depression or mood disorders  The patients weight, height, BMI and visual acuity have been recorded in the chart  I have made referrals, counseling and provided education to the patient based review of the above and I have provided the pt with a written personalized care plan for preventive services.   Cryptogenic cirrhosis: folled by Dr. Hilarie Fredrickson After lab testing no genetic or other cause was found to explain her cirrhosis. Her liver disease remains very well compensated, without evidence for volume overload, jaundice, encephalopathy. Recent upper endoscopy revealed grade 1 esophageal varices. We have discussed the addition of a nonselective beta blocker today at length. Given that her disease is well compensated and the varix was very small, we will not initiate beta blockers today. I am somewhat concerned that the addition of this medication may lead to more fatigue and put her more at risk for falling. Should her disease become decompensated, we can rediscuss beta blocker initiation at that time, because decompensated cirrhosis increases portal hypertension and likely degree of varices.   Choledocholithiasis status post ERCP With sphincterotomy and removal of CBD stone on 11/30/2012 Liver function tets improved after procedure. Followed by Dr. Hilarie Fredrickson, GI.  Hypertension: Well controlled on no medication.  BP Readings from Last 3 Encounters:  01/25/14 140/60  09/18/13 112/64  09/13/13 148/78  Chest pain with exertion:None  Edema: noneShort of breath:None  Average home BPs: not checking at  home  Other issues: Sister age 93 diagnosed with CAD.Marland Kitchen Had CABG x 4.   Elevated Cholesterol: Well controlled previously on no medicaition.  Lab Results  Component Value Date   CHOL 197 01/18/2014   HDL 46.70 01/18/2014   LDLCALC 136* 01/18/2014   LDLDIRECT 135.2 06/06/2009   TRIG 74.0 01/18/2014   CHOLHDL 4 01/18/2014                                        Osteoporosis: On fosamax. Has been on this medication for 10 years.   Vit D def, resolved s/p supplementation with rx dosing. On twice daily 400 mg D3   Has low protein. No red flags. Good appetitie Wt Readings from Last 3 Encounters:  01/25/14 116 lb 8 oz (52.844 kg)  09/18/13 120 lb (54.432 kg)  09/13/13 120 lb (54.432 kg)   No prediabetes. Lab Results  Component Value Date   HGBA1C 4.9 01/18/2014    GERD: uses prilosec occ 2 a day. Needs refill.   reports that she has never smoked. She has never used smokeless tobacco. She reports that she does not drink alcohol or use illicit drugs.  Review of Systems  Constitutional: Negative for fever and fatigue.  HENT: Negative for ear pain.  Eyes: Negative for pain.  Respiratory: Negative for cough, shortness of breath and wheezing.  Cardiovascular: Negative for chest pain, palpitations and leg swelling.  Gastrointestinal: negative for constipation. Negative for nausea, abdominal pain, diarrhea and blood in stool.  Genitourinary: Negative for dysuria, vaginal bleeding and vaginal pain.  Skin: Negative for rash.  Neurological:  Negative for dizziness, syncope and light-headedness.  Psychiatric/Behavioral: Negative for dysphoric mood. The patient is not nervous/anxious.  Objective:   Physical Exam  Constitutional: Vital signs are normal. She appears well-developed and well-nourished. She is cooperative. Non-toxic appearance. She does not appear ill. No distress.  HENT: Head: Normocephalic.  Right Ear: Hearing, tympanic membrane, external ear and ear  canal normal.  Left Ear: Hearing, tympanic membrane, external ear and ear canal normal.  Nose: Nose normal.  Eyes: Conjunctivae, EOM and lids are normal. Pupils are equal, round, and reactive to light. No foreign bodies found.  Neck: Trachea normal and normal range of motion. Neck supple. Carotid bruit is not present. No mass and no thyromegaly present. Neg spurlings Cardiovascular: Normal rate, regular rhythm, S1 normal, S2 normal, normal heart sounds and intact distal pulses. Exam reveals no gallop.  No murmur heard.  Pulmonary/Chest: Effort normal and breath sounds normal. No respiratory distress. She has no wheezes. She has no rhonchi. She has no rales.  Abdominal: Soft. Normal appearance and bowel sounds are normal. She exhibits no distension, no fluid wave, no abdominal bruit and no mass. There is no hepatosplenomegaly. There is no tenderness. There is no rebound, no guarding and no CVA tenderness. No hernia.  Genitourinary:Not performed this year.. Pelvic exam was Not performed. Lymphadenopathy:  She has no cervical adenopathy.  She has no axillary adenopathy.  Neurological: She is alert. She has normal strength. No cranial nerve deficit or sensory deficit.  Skin: Skin is warm, dry and intact. No rash noted.  Psychiatric: Her speech is normal and behavior is normal. Judgment normal. Her mood appears not anxious. Cognition and memory are normal. She does not exhibit a depressed mood.  No low back ttp, no focal ttp in hip or left leg, but decreased ROM in B hips, strength 5/5 B lower ext. Assessment & Plan:   Annual medicare Wellness: The patient's preventative maintenance and recommended screening tests for an annual wellness exam were reviewed in full today.  Brought up to date unless services declined.  Counselled on the importance of diet, exercise, and its role in overall health and mortality.  The patient's FH and SH was reviewed, including their home life, tobacco status,  and drug and alcohol status.   UPTD with Td and PNa, has had shingles vaccine. Flu given at [facility] this fall.  Given prevnar today. Last DXA: 2011, on fosamax for 10 years, will stopped in 2013. Plan repeat in 2015. Last colonoscopy: 2010, no repeat needed.  Mammogram 2015 nml, pt wishes to continue  Will check lipids, vit D every other year.

## 2014-01-25 NOTE — Progress Notes (Signed)
Pre visit review using our clinic review tool, if applicable. No additional management support is needed unless otherwise documented below in the visit note. 

## 2014-01-25 NOTE — Assessment & Plan Note (Signed)
Well controlled on no med. 

## 2014-01-25 NOTE — Assessment & Plan Note (Signed)
Well controlled. Continue current medication.  

## 2014-01-25 NOTE — Patient Instructions (Addendum)
Increase protein in diet, consider meal supplement. Stop at front desk to set up bone density.

## 2014-01-25 NOTE — Assessment & Plan Note (Signed)
Start protein supplement and increase protein in diet.

## 2014-01-29 ENCOUNTER — Ambulatory Visit (HOSPITAL_COMMUNITY)
Admission: RE | Admit: 2014-01-29 | Discharge: 2014-01-29 | Disposition: A | Discharge status: Home / Self Care | Payer: Medicare HMO | Source: Ambulatory Visit | Attending: Family Medicine | Admitting: Family Medicine

## 2014-01-29 DIAGNOSIS — Z13820 Encounter for screening for osteoporosis: Secondary | ICD-10-CM | POA: Insufficient documentation

## 2014-01-29 DIAGNOSIS — Z780 Asymptomatic menopausal state: Secondary | ICD-10-CM | POA: Diagnosis not present

## 2014-01-29 DIAGNOSIS — M810 Age-related osteoporosis without current pathological fracture: Secondary | ICD-10-CM

## 2014-03-01 ENCOUNTER — Encounter: Payer: Self-pay | Admitting: Family Medicine

## 2014-03-11 ENCOUNTER — Encounter: Payer: Self-pay | Admitting: Internal Medicine

## 2014-03-11 ENCOUNTER — Ambulatory Visit (INDEPENDENT_AMBULATORY_CARE_PROVIDER_SITE_OTHER): Payer: Medicare HMO | Admitting: Internal Medicine

## 2014-03-11 VITALS — BP 120/80 | HR 95 | Temp 98.2°F | Wt 115.0 lb

## 2014-03-11 DIAGNOSIS — J0100 Acute maxillary sinusitis, unspecified: Secondary | ICD-10-CM

## 2014-03-11 DIAGNOSIS — J019 Acute sinusitis, unspecified: Secondary | ICD-10-CM | POA: Insufficient documentation

## 2014-03-11 MED ORDER — AMOXICILLIN 500 MG PO TABS: 1000.0000 mg | ORAL_TABLET | Freq: Two times a day (BID) | ORAL | Status: DC

## 2014-03-11 NOTE — Assessment & Plan Note (Signed)
With early OM Worsening and sick over a week Discussed analgesics Amoxicillin

## 2014-03-11 NOTE — Progress Notes (Signed)
Subjective:    Patient ID: Brianna Barnes, female    DOB: Apr 04, 1928, 80 y.o.   MRN: [id number]  HPI Here with daughter Neoma Laming for ?sinus infection  Started getting sick ~9 days ago Head congestion Rhinorrhea and post nasal drip Coughs in spells--- small amount sputum  Low grade fever-- highest 99.6 No chills or sweats at night No SOB Some sore throat at beginning ---some better now No ear pain--but hearing is off. Left ear popped real loud and hearing got worse Frontal pressure and headache  Tried sinus and headache meds--- alka seltzer plus also. May have helped a little at first  Current Outpatient Prescriptions on File Prior to Visit  Medication Sig Dispense Refill  . acetaminophen (TYLENOL) 325 MG tablet Take 325 mg by mouth every 6 (six) hours as needed. For pain    . aspirin EC 81 MG tablet Take 81 mg by mouth daily.    . calcium-vitamin D (OSCAL WITH D) 500-200 MG-UNIT per tablet Take 1 tablet by mouth daily.    . Cholecalciferol (VITAMIN D3) 400 UNITS CAPS Take 400 mg by mouth 2 (two) times daily.      Marland Kitchen latanoprost (XALATAN) 0.005 % ophthalmic solution     . omeprazole (PRILOSEC) 20 MG capsule Take 1-2 capsules (20-40 mg total) by mouth daily. 180 capsule 3  . vitamin E 400 UNIT capsule Take 400 Units by mouth daily.     No current facility-administered medications on file prior to visit.    No Known Allergies  Past Medical History  Diagnosis Date  . Osteoarthritis     hands, knees, and low back  . GERD (gastroesophageal reflux disease)   . Osteoporosis   . History of positive PPD     + TB SKIN TEST WITHOUT TB  . PUD (peptic ulcer disease)   . History of colonoscopy with polypectomy     ADENOMATOUS AND HYPERPLASIA POLYPS  (2008  &  2010)  . Cirrhosis, cryptogenic     FOLLOWED BY DR PYRTLE-- LOV 04-02-2013 IN EPIC--  STABLE PER NOTE  . Iron deficiency anemia   . Uterovaginal prolapse, incomplete   . Hypertension, portal     SECONDARY TO CRYPTOGENIC  CIRRHOSIS  . Hyperlipidemia   . Wears glasses   . Wears hearing aid     left ear only  . Loss of hearing     right ear    Past Surgical History  Procedure Laterality Date  . External ear surgery Left 1980  . Cholecystectomy  08/25/2011    Procedure: LAPAROSCOPIC CHOLECYSTECTOMY WITH INTRAOPERATIVE CHOLANGIOGRAM;  Surgeon: Zenovia Jarred, MD;  Location: [facility];  Service: General;  Laterality: N/A;  . Endoscopic retrograde cholangiopancreatography (ercp) with propofol N/A 11/30/2012    Procedure: ENDOSCOPIC RETROGRADE CHOLANGIOPANCREATOGRAPHY (ERCP) WITH PROPOFOL;  Surgeon: Milus Banister, MD;  Location: [facility];  Service: Endoscopy;  Laterality: N/A;  . Lumbar disc surgery  1990  . Appendectomy  1960'S  . Knee arthroscopy w/ meniscectomy Right 06-19-2002  . Removal of urinary sling N/A 09/13/2013    Procedure: REMOVAL OF RETAINED PESSARY;  Surgeon: Jorja Loa, MD;  Location: [facility];  Service: Urology;  Laterality: N/A;    Family History  Problem Relation Age of Onset  . Heart disease Mother   . Heart disease Father 28  . Hyperlipidemia Father   . Diabetes Father   . Cirrhosis Sister   . Diabetes Brother   . Heart disease Brother  History   Social History  . Marital Status: Widowed    Spouse Name: N/A    Number of Children: 3  . Years of Education: N/A   Occupational History  . Retired, [employer]    Social History Main Topics  . Smoking status: Never Smoker   . Smokeless tobacco: Never Used  . Alcohol Use: No  . Drug Use: No  . Sexual Activity: Not on file   Other Topics Concern  . Not on file   Social History Narrative       Review of Systems  No rash No vomiting or diarrhea Appetite is off     Objective:   Physical Exam  Constitutional: She appears well-developed and well-nourished. No distress.  HENT:  Mouth/Throat: Oropharynx is clear and moist. No oropharyngeal exudate.  Moderate maxillary and frontal  tenderness Moderate nasal inflammation and some yellow mucus on left Left TM is red superiorly  Right TM normal   Neck: Normal range of motion. Neck supple. No thyromegaly present.  Pulmonary/Chest: Effort normal and breath sounds normal. No respiratory distress. She has no wheezes. She has no rales.  Lymphadenopathy:    She has no cervical adenopathy.          Assessment & Plan:

## 2014-03-11 NOTE — Progress Notes (Signed)
Pre visit review using our clinic review tool, if applicable. No additional management support is needed unless otherwise documented below in the visit note. 

## 2014-05-23 ENCOUNTER — Encounter: Payer: Self-pay | Admitting: *Deleted

## 2014-05-31 ENCOUNTER — Encounter: Payer: Self-pay | Admitting: *Deleted

## 2014-05-31 ENCOUNTER — Telehealth: Payer: Self-pay

## 2014-05-31 ENCOUNTER — Other Ambulatory Visit: Payer: Self-pay

## 2014-05-31 DIAGNOSIS — K769 Liver disease, unspecified: Secondary | ICD-10-CM

## 2014-05-31 NOTE — Telephone Encounter (Signed)
-----   Message from Maury Dus, RN sent at 11/23/2013  1:58 PM EDT ----- Regarding: ABD [place] Pt needs repeat US in March

## 2014-05-31 NOTE — Telephone Encounter (Signed)
Pt scheduled for [place] of abd at [facility] 06/07/14@11am , pt to arrive there at 10:45am. Pt to be NPO after midnight. Pt aware of appt.

## 2014-06-03 ENCOUNTER — Telehealth: Payer: Self-pay | Admitting: Internal Medicine

## 2014-06-03 NOTE — Telephone Encounter (Signed)
Pt to keep the appt tomorrow as scheduled. Will call pt with [place] results.

## 2014-06-04 ENCOUNTER — Encounter: Payer: Self-pay | Admitting: Internal Medicine

## 2014-06-04 ENCOUNTER — Other Ambulatory Visit (INDEPENDENT_AMBULATORY_CARE_PROVIDER_SITE_OTHER): Payer: Medicare HMO

## 2014-06-04 ENCOUNTER — Ambulatory Visit (INDEPENDENT_AMBULATORY_CARE_PROVIDER_SITE_OTHER): Payer: Medicare HMO | Admitting: Internal Medicine

## 2014-06-04 VITALS — BP 150/70 | HR 92 | Ht <= 58 in | Wt 119.2 lb

## 2014-06-04 DIAGNOSIS — N939 Abnormal uterine and vaginal bleeding, unspecified: Secondary | ICD-10-CM

## 2014-06-04 DIAGNOSIS — K7469 Other cirrhosis of liver: Secondary | ICD-10-CM

## 2014-06-04 DIAGNOSIS — K219 Gastro-esophageal reflux disease without esophagitis: Secondary | ICD-10-CM

## 2014-06-04 DIAGNOSIS — K766 Portal hypertension: Secondary | ICD-10-CM

## 2014-06-04 LAB — COMPREHENSIVE METABOLIC PANEL
ALT: 23 U/L (ref 0–35)
AST: 36 U/L (ref 0–37)
Albumin: 3.9 g/dL (ref 3.5–5.2)
Alkaline Phosphatase: 88 U/L (ref 39–117)
BILIRUBIN TOTAL: 0.7 mg/dL (ref 0.2–1.2)
BUN: 10 mg/dL (ref 6–23)
CALCIUM: 9.1 mg/dL (ref 8.4–10.5)
CO2: 31 mEq/L (ref 19–32)
CREATININE: 0.54 mg/dL (ref 0.40–1.20)
Chloride: 104 mEq/L (ref 96–112)
GFR: 114.47 mL/min (ref >60.00)
Glucose, Bld: 106 mg/dL — ABNORMAL HIGH (ref 70–99)
Potassium: 3.9 mEq/L (ref 3.5–5.1)
SODIUM: 139 meq/L (ref 135–145)
Total Protein: 6.5 g/dL (ref 6.0–8.3)

## 2014-06-04 LAB — CBC WITH DIFFERENTIAL/PLATELET
Basophils Absolute: 0 10*3/uL (ref 0.0–0.1)
Basophils Relative: 0.6 % (ref 0.0–3.0)
EOS PCT: 2.7 % (ref 0.0–5.0)
Eosinophils Absolute: 0.1 10*3/uL (ref 0.0–0.7)
HCT: 38.7 % (ref 36.0–46.0)
HEMOGLOBIN: 13.3 g/dL (ref 12.0–15.0)
Lymphocytes Relative: 26.9 % (ref 12.0–46.0)
Lymphs Abs: 1 10*3/uL (ref 0.7–4.0)
MCHC: 34.4 g/dL (ref 30.0–36.0)
MCV: 87.8 fl (ref 78.0–100.0)
MONOS PCT: 13.7 % — AB (ref 3.0–12.0)
Monocytes Absolute: 0.5 10*3/uL (ref 0.1–1.0)
Neutro Abs: 2.1 10*3/uL (ref 1.4–7.7)
Neutrophils Relative %: 56.1 % (ref 43.0–77.0)
PLATELETS: 163 10*3/uL (ref 150.0–400.0)
RBC: 4.4 Mil/uL (ref 3.87–5.11)
RDW: 13.7 % (ref 11.5–15.5)
WBC: 3.7 10*3/uL — ABNORMAL LOW (ref 4.0–10.5)

## 2014-06-04 LAB — PROTIME-INR
INR: 1.1 ratio — ABNORMAL HIGH (ref 0.8–1.0)
Prothrombin Time: 11.7 s (ref 9.6–13.1)

## 2014-06-04 NOTE — Patient Instructions (Addendum)
Your physician has requested that you go to the basement for the following lab work before leaving today: CBC, CMET, INR  We have changed your abdominal ultrasound to an MRI of the abdomen instead (for [place] screening and mid abdominal pain)  You have been scheduled for an MRI at [facility] (1st floor of hospital) on 06/12/14. Your appointment time is 7:00 am. Please arrive 15 minutes prior to your appointment time for registration purposes. Please make certain not to have anything to eat or drink 4 hours prior to your test. In addition, if you have any metal in your body, have a pacemaker or defibrillator, please be sure to let your ordering physician know. This test typically takes 45 minutes to 1 hour to complete.   You have been scheduled for an appointment with Dr Molli Posey at [facility] on tomorrow, 06/05/14 @ 11:00 am. You must arrive no later than 10:30 am. Please be sure to bring your insurance card and driver's license. Address: [address] [place], [place] [address]  [phone]  You have been scheduled for a follow up appointment with Dr Hilarie Fredrickson on Monday 07/29/14 @ 2:45 pm.

## 2014-06-04 NOTE — Progress Notes (Signed)
Subjective:    Patient ID: [name], female    DOB: [birth year], 81 y.o.   MRN: [id number]  HPI Mrs. Nordlund is an 81 year old female with a history of cryptogenic cirrhosis with portal hypertension (small varices and splenomegaly), CBD stone status post ERCP with stone extraction, osteoarthritis and GERD who is seen for follow-up. She's here today with her daughter. She was last seen in July 2015. Overall she reports she is feeling well from a GI standpoint. She reports a good appetite. No trouble swallowing. No abdominal pain. No swelling in her legs or abdomen. No jaundice. No blood in her stool or melena. Bowel movements a been regular though dark while on iron. Weight is stable. She is taking omeprazole 20-40 mg daily which helps control heartburn and reflux symptoms. She has over the last several months noticed vaginal bleeding which she describes as bright red. She was seen by urology and reportedly urine testing was okay. She has a history of bladder prolapse and may have another pessary placed soon. Vaginal bleeding is occurring on most days. Started in January 2016 and then stopped recurred over the last month. She does not have an active gynecologist she stopped her baby aspirin when she noticed the vaginal bleeding  Review of Systems As per HPI, otherwise negative  Current Medications, Allergies, Past Medical History, Past Surgical History, Family History and Social History were reviewed in [employer] record.     Objective:   Physical Exam BP 150/70 mmHg  Pulse 92  Ht 4\' 10"  (1.473 m)  Wt 119 lb 4 oz (54.091 kg)  BMI 24.93 kg/m2 Constitutional: Well-developed and well-nourished. No distress. HEENT: Normocephalic and atraumatic. Oropharynx is clear and moist. No oropharyngeal exudate. Conjunctivae are normal.  No scleral icterus. Neck: Neck supple. Trachea midline. Cardiovascular: Normal rate, regular rhythm and intact distal pulses. No  M/R/G Pulmonary/chest: Effort normal and breath sounds normal. No wheezing, rales or rhonchi. Abdominal: Soft, mild lower abdominal tenderness, nondistended. Bowel sounds active throughout.  Extremities: no clubbing, cyanosis, or edema Lymphadenopathy: No cervical adenopathy noted. Neurological: Alert and oriented to person place and time. No asterixis Skin: Skin is warm and dry. No rashes noted. Psychiatric: Normal mood and affect. Behavior is normal.  CBC    Component Value Date/Time   WBC 3.5* 01/18/2014 0847   RBC 4.39 01/18/2014 0847   HGB 13.0 01/18/2014 0847   HCT 39.7 01/18/2014 0847   PLT 155.0 01/18/2014 0847   MCV 90.5 01/18/2014 0847   MCH 30.8 08/26/2011 0635   MCHC 32.8 01/18/2014 0847   RDW 13.2 01/18/2014 0847   LYMPHSABS 1.1 01/18/2014 0847   MONOABS 0.4 01/18/2014 0847   EOSABS 0.2 01/18/2014 0847   BASOSABS 0.0 01/18/2014 0847    CMP     Component Value Date/Time   NA 141 01/18/2014 0847   K 4.2 01/18/2014 0847   CL 107 01/18/2014 0847   CO2 27 01/18/2014 0847   GLUCOSE 84 01/18/2014 0847   BUN 9 01/18/2014 0847   CREATININE 0.6 01/18/2014 0847   CALCIUM 9.3 01/18/2014 0847   PROT 5.8* 01/18/2014 0847   ALBUMIN 3.0* 01/18/2014 0847   AST 33 01/18/2014 0847   ALT 15 01/18/2014 0847   ALKPHOS 111 01/18/2014 0847   BILITOT 0.6 01/18/2014 0847   GFRNONAA 85* 08/26/2011 0635   GFRAA >90 08/26/2011 0635    Lab Results  Component Value Date   INR 1.0 09/21/2013   INR 1.1* 04/02/2013   INR  1.1* 01/02/2013       Assessment & Plan:  81 year old female with a history of cryptogenic cirrhosis with portal hypertension (small varices and splenomegaly), CBD stone status post ERCP with stone extraction, osteoarthritis and GERD who is seen for follow-up.   1. Cryptogenic cirrhosis -- liver disease remains well compensated. Continue supportive care. We discussed again the signs of decompensated liver disease and she will notify me should this  occur --Variceal screening - history of small varices, screening up-to-date. Beta blocker deferred due to fall risk --HCC screening - repeat MRI ordered today to evaluate for [facility] but also to evaluate lower abdominal pain --Labs -- CBC, CMP and INR today  2. Vaginal bleeding -- GYN referral strongly recommended and will be placed today. I'm concerned about uterine pathology  3. GERD -- well-controlled continue omeprazole  Follow me in 6 months, sooner if needed

## 2014-06-05 ENCOUNTER — Other Ambulatory Visit: Payer: Self-pay | Admitting: Obstetrics and Gynecology

## 2014-06-07 ENCOUNTER — Ambulatory Visit (HOSPITAL_COMMUNITY): Payer: Medicare HMO

## 2014-06-12 ENCOUNTER — Ambulatory Visit (HOSPITAL_COMMUNITY): Payer: Medicare HMO

## 2014-06-19 ENCOUNTER — Ambulatory Visit (HOSPITAL_COMMUNITY): Payer: Medicare HMO

## 2014-06-24 ENCOUNTER — Ambulatory Visit (HOSPITAL_COMMUNITY)
Admission: RE | Admit: 2014-06-24 | Discharge: 2014-06-24 | Disposition: A | Discharge status: Home / Self Care | Payer: Medicare HMO | Source: Ambulatory Visit | Attending: Internal Medicine | Admitting: Internal Medicine

## 2014-06-24 ENCOUNTER — Ambulatory Visit (HOSPITAL_COMMUNITY): Admission: RE | Admit: 2014-06-24 | Payer: Medicare HMO | Source: Ambulatory Visit

## 2014-06-24 ENCOUNTER — Other Ambulatory Visit: Payer: Self-pay | Admitting: Internal Medicine

## 2014-06-24 DIAGNOSIS — K7469 Other cirrhosis of liver: Secondary | ICD-10-CM

## 2014-06-24 DIAGNOSIS — K766 Portal hypertension: Secondary | ICD-10-CM | POA: Insufficient documentation

## 2014-06-24 DIAGNOSIS — I8500 Esophageal varices without bleeding: Secondary | ICD-10-CM | POA: Insufficient documentation

## 2014-06-24 DIAGNOSIS — I864 Gastric varices: Secondary | ICD-10-CM | POA: Diagnosis not present

## 2014-06-24 MED ORDER — GADOBENATE DIMEGLUMINE 529 MG/ML IV SOLN
10.0000 mL | Freq: Once | INTRAVENOUS | Status: AC | PRN
  Administered 2014-06-24: 10 mL via INTRAVENOUS

## 2014-07-08 NOTE — Progress Notes (Signed)
Brianna Barnes  DICTATION # [id number] CSN# [id number]   Margarette Asal, MD 07/08/2014 1:32 PM

## 2014-07-09 NOTE — H&P (Signed)
NAMEVERONIA, LAPRISE                ACCOUNT NO.:  [id number]  MEDICAL RECORD NO.:  [id number]  LOCATION:                                 FACILITY:  PHYSICIAN:  Ralene Bathe. Matthew Saras, M.D.DATE OF BIRTH:  04-25-1928  DATE OF ADMISSION: DATE OF DISCHARGE:                             HISTORY & PHYSICAL   CHIEF COMPLAINT:  Postmenopausal bleeding.  HISTORY OF PRESENT ILLNESS:  An 81 year old, G4, P3, postmenopausal patient who was referred to me in March by Dr. Zenovia Jarred for postmenopausal bleeding.  She has also been seeing Dr. Franchot Gallo for some urologic issues.  On evaluation in the office, endometrial biopsy showed scant superficial fragments of atrophic endometrial glands.  [place] on June 18, 2014 showed slightly thickened endometrium and what appeared to be a small 6 mm polyp within the fundus of the uterus. She presents now for D and C, hysteroscopy with MyoSure.  This procedure including specific risks related to bleeding, infection, adjacent organ injury, other complications such as perforation, may require additional surgery discussed, reviewed with her, which she understands and accepts.  ALLERGIES:  None.  CURRENT MEDICATIONS:  Baby aspirin daily, iron, latanoprost, omeprazole, vitamin D, and vitamin E supplement.  PRIOR SURGICAL HISTORY:  She has had appendectomy and knee surgery, cholecystectomy, three vaginal deliveries.  REVIEW OF SYSTEMS:  Significant for anemia, gallbladder disease, osteoporosis, arthritis.  FAMILY HISTORY:  Otherwise negative.  SOCIAL HISTORY:  Denies alcohol, tobacco, or drug use.  She is widowed. Eliezer Lofts, MD is her medical doctor.  PHYSICAL EXAMINATION:  VITAL SIGNS:  Temp 98.2, blood pressure 140/82. HEENT:  Unremarkable. NECK:  Supple without masses. LUNGS:  Clear. CARDIOVASCULAR:  Regular rate and rhythm without murmurs, rubs, gallops. BREASTS:  Without masses. ABDOMEN:  Soft, flat, nontender.  Vulva, vagina, cervix normal.   Uterus mid position, normal size.  Adnexa negative. NEUROLOGIC:  Unremarkable.  IMPRESSION:  Postmenopausal bleeding, endometrial polyp.  PLAN:  D and C, hysteroscopy, with MyoSure.  Procedure and risks discussed as above.     Stefania Goulart M. Matthew Saras, M.D.     [facility]/MEDQ  D:  07/08/2014  T:  07/09/2014  Job:  [id number]

## 2014-07-10 ENCOUNTER — Encounter (HOSPITAL_BASED_OUTPATIENT_CLINIC_OR_DEPARTMENT_OTHER): Payer: Self-pay | Admitting: *Deleted

## 2014-07-12 NOTE — Progress Notes (Addendum)
UNABLE TO REACH PT'S DAUGHTER, WHOM WE ARE TO CALL BECAUSE PT IS VERY HOH AND TRIED TO REACH PT ON HER PHONE.  LM ON PT'S DAUGHTER PHONE AND PT'S PHONE W/ INSTRUCTIONS TO [facility] AND ARRIVE AT 0600 AND NPO AFTER MN WITH EXCEPTION SIP OF WATER W/ PRILOSEC IF SHE STILL TAKES IT.  PT WILL NEEDS ISTAT AND EKG.

## 2014-07-15 ENCOUNTER — Encounter (HOSPITAL_BASED_OUTPATIENT_CLINIC_OR_DEPARTMENT_OTHER)
Admission: RE | Disposition: A | Discharge status: Home / Self Care | Payer: Self-pay | Source: Ambulatory Visit | Attending: Obstetrics and Gynecology

## 2014-07-15 ENCOUNTER — Encounter (HOSPITAL_BASED_OUTPATIENT_CLINIC_OR_DEPARTMENT_OTHER): Payer: Self-pay | Admitting: *Deleted

## 2014-07-15 ENCOUNTER — Ambulatory Visit (HOSPITAL_BASED_OUTPATIENT_CLINIC_OR_DEPARTMENT_OTHER): Payer: Medicare HMO | Admitting: Anesthesiology

## 2014-07-15 ENCOUNTER — Ambulatory Visit (HOSPITAL_BASED_OUTPATIENT_CLINIC_OR_DEPARTMENT_OTHER)
Admission: RE | Admit: 2014-07-15 | Discharge: 2014-07-15 | Disposition: A | Discharge status: Home / Self Care | Payer: Medicare HMO | Source: Ambulatory Visit | Attending: Obstetrics and Gynecology | Admitting: Obstetrics and Gynecology

## 2014-07-15 DIAGNOSIS — N814 Uterovaginal prolapse, unspecified: Secondary | ICD-10-CM | POA: Diagnosis not present

## 2014-07-15 DIAGNOSIS — N950 Postmenopausal bleeding: Secondary | ICD-10-CM | POA: Insufficient documentation

## 2014-07-15 DIAGNOSIS — Z9049 Acquired absence of other specified parts of digestive tract: Secondary | ICD-10-CM | POA: Insufficient documentation

## 2014-07-15 DIAGNOSIS — Z7982 Long term (current) use of aspirin: Secondary | ICD-10-CM | POA: Diagnosis not present

## 2014-07-15 DIAGNOSIS — M797 Fibromyalgia: Secondary | ICD-10-CM | POA: Insufficient documentation

## 2014-07-15 HISTORY — PX: HYSTEROSCOPY WITH D & C: SHX1775

## 2014-07-15 HISTORY — DX: Personal history of other infectious and parasitic diseases: Z86.19

## 2014-07-15 LAB — POCT I-STAT 4, (NA,K, GLUC, HGB,HCT)
Glucose, Bld: 93 mg/dL (ref 70–99)
HCT: 36 % (ref 36.0–46.0)
Hemoglobin: 12.2 g/dL (ref 12.0–15.0)
Potassium: 3.7 mmol/L (ref 3.5–5.1)
Sodium: 140 mmol/L (ref 135–145)

## 2014-07-15 SURGERY — DILATATION AND CURETTAGE /HYSTEROSCOPY
Anesthesia: General | Site: Vagina

## 2014-07-15 MED ORDER — DEXAMETHASONE SODIUM PHOSPHATE 4 MG/ML IJ SOLN
INTRAMUSCULAR | Status: DC | PRN
  Administered 2014-07-15: 5 mg via INTRAVENOUS

## 2014-07-15 MED ORDER — LACTATED RINGERS IV SOLN
INTRAVENOUS | Status: DC
  Administered 2014-07-15: 07:00:00 via INTRAVENOUS
  Filled 2014-07-15: qty 1000

## 2014-07-15 MED ORDER — SODIUM CHLORIDE 0.9 % IR SOLN
Status: DC | PRN
  Administered 2014-07-15: 3000 mL

## 2014-07-15 MED ORDER — ONDANSETRON HCL 4 MG/2ML IJ SOLN
INTRAMUSCULAR | Status: DC | PRN
  Administered 2014-07-15: 4 mg via INTRAVENOUS

## 2014-07-15 MED ORDER — LIDOCAINE HCL (CARDIAC) 20 MG/ML IV SOLN
INTRAVENOUS | Status: DC | PRN
  Administered 2014-07-15: 40 mg via INTRAVENOUS

## 2014-07-15 MED ORDER — PROPOFOL 10 MG/ML IV BOLUS
INTRAVENOUS | Status: DC | PRN
  Administered 2014-07-15: 100 mg via INTRAVENOUS

## 2014-07-15 MED ORDER — MIDAZOLAM HCL 2 MG/2ML IJ SOLN
INTRAMUSCULAR | Status: AC
  Filled 2014-07-15: qty 2

## 2014-07-15 MED ORDER — KETOROLAC TROMETHAMINE 30 MG/ML IJ SOLN
INTRAMUSCULAR | Status: DC | PRN
  Administered 2014-07-15: 10 mg via INTRAVENOUS

## 2014-07-15 MED ORDER — FENTANYL CITRATE (PF) 100 MCG/2ML IJ SOLN
INTRAMUSCULAR | Status: DC | PRN
  Administered 2014-07-15: 25 ug via INTRAVENOUS

## 2014-07-15 MED ORDER — LIDOCAINE HCL 1 % IJ SOLN
INTRAMUSCULAR | Status: DC | PRN
  Administered 2014-07-15: 9 mL

## 2014-07-15 MED ORDER — FENTANYL CITRATE (PF) 100 MCG/2ML IJ SOLN
INTRAMUSCULAR | Status: AC
  Filled 2014-07-15: qty 4

## 2014-07-15 SURGICAL SUPPLY — 26 items
CANISTER SUCTION 2500CC (MISCELLANEOUS) ×4 IMPLANT
CATH ROBINSON RED A/P 16FR (CATHETERS) ×4 IMPLANT
COVER BACK TABLE 60X90IN (DRAPES) ×4 IMPLANT
DEVICE MYOSURE CLASSIC (MISCELLANEOUS) IMPLANT
DEVICE MYOSURE LITE (MISCELLANEOUS) IMPLANT
DRAPE HYSTEROSCOPY (DRAPE) ×4 IMPLANT
DRAPE LG THREE QUARTER DISP (DRAPES) ×4 IMPLANT
DRSG TELFA 3X8 NADH (GAUZE/BANDAGES/DRESSINGS) IMPLANT
FILTER ARTHROSCOPY CONVERTOR (FILTER) ×4 IMPLANT
GLOVE BIO SURGEON STRL SZ7 (GLOVE) ×8 IMPLANT
GLOVE INDICATOR 7.5 STRL GRN (GLOVE) ×9 IMPLANT
GLOVE SURG SS PI 7.5 STRL IVOR (GLOVE) ×3 IMPLANT
GOWN STRL REUS W/TWL LRG LVL3 (GOWN DISPOSABLE) ×8 IMPLANT
GOWN STRL REUS W/TWL XL LVL3 (GOWN DISPOSABLE) ×6 IMPLANT
IV NS IRRIG 3000ML ARTHROMATIC (IV SOLUTION) ×4 IMPLANT
LEGGING LITHOTOMY PAIR STRL (DRAPES) ×4 IMPLANT
PACK BASIN DAY SURGERY FS (CUSTOM PROCEDURE TRAY) ×4 IMPLANT
PAD DRESSING TELFA 3X8 NADH (GAUZE/BANDAGES/DRESSINGS) IMPLANT
PAD OB MATERNITY 4.3X12.25 (PERSONAL CARE ITEMS) ×4 IMPLANT
PAD PREP 24X48 CUFFED NSTRL (MISCELLANEOUS) ×4 IMPLANT
SEAL ROD LENS SCOPE MYOSURE (ABLATOR) ×4 IMPLANT
TOWEL OR 17X24 6PK STRL BLUE (TOWEL DISPOSABLE) ×8 IMPLANT
TRAY DSU PREP LF (CUSTOM PROCEDURE TRAY) ×4 IMPLANT
TUBING AQUILEX INFLOW (TUBING) ×4 IMPLANT
TUBING AQUILEX OUTFLOW (TUBING) ×4 IMPLANT
WATER STERILE IRR 500ML POUR (IV SOLUTION) ×1 IMPLANT

## 2014-07-15 NOTE — Progress Notes (Signed)
The patient was re-examined with no change in status 

## 2014-07-15 NOTE — Op Note (Signed)
Preoperative diagnosis: Postmenopausal bleeding, possible endometrial polyp  Postoperative diagnosis: Same  Procedure: D&C hysteroscopy  Surgeon: Matthew Saras  Anesthesia: Gen.  Specimens removed: Endometrial curettings, to pathology  EBL: Less than 10 cc  Procedure and findings:  The patient taken the operating room after an adequate level of general anesthesia was obtained with the patient's legs in stirrups the perineum and vagina were prepped and draped in the usual fashion, and the bladder was drained. Appropriate timeouts taken at that point. EUA was carried out uterus was small there was a moderate degree of prolapse noted with the cervix presenting right to the introitus adnexa negative. Cervix was grasped with a tenaculum, paracervical block was then created by infiltrating at 3 and 9:00 submucosally, 5-7 cc of 1% plain Xylocaine at each site after negative aspiration. The uterus was sounded in in mid plane position to 8 cm easily dilated to a 27 Pratt dilator. The continuous flow hysteroscope was inserted noting a completely normal atrophic appearing cavity and did not see any polyps nor tissue buildup very painful. Small curette used to perform minimal curettage just to resubmit endometrial sampling. This was sent as endometrial curettings she tolerated this well went to recovery room in good condition.  Dictated with [place].

## 2014-07-15 NOTE — Anesthesia Preprocedure Evaluation (Addendum)
Anesthesia Evaluation  Patient identified by MRN, date of birth, ID band Patient awake    Reviewed: Allergy & Precautions, H&P , NPO status , Patient's Chart, lab work & pertinent test results  Airway Mallampati: II  TM Distance: >3 FB Neck ROM: full    Dental  (+) Edentulous Upper, Edentulous Lower, Dental Advisory Given   Pulmonary neg pulmonary ROS,  breath sounds clear to auscultation  Pulmonary exam normal       Cardiovascular Exercise Tolerance: Good hypertension, Rhythm:regular Rate:Normal  AVM   Neuro/Psych negative neurological ROS  negative psych ROS   GI/Hepatic PUD, GERD-  Medicated and Controlled,(+) Cirrhosis -      , Hepatitis -, C  Endo/Other  negative endocrine ROSprediabetes  Renal/GU negative Renal ROS  negative genitourinary   Musculoskeletal   Abdominal   Peds  Hematology negative hematology ROS (+)   Anesthesia Other Findings   Reproductive/Obstetrics negative OB ROS                             Anesthesia Physical  Anesthesia Plan  ASA: III  Anesthesia Plan: General   Post-op Pain Management:    Induction: Intravenous  Airway Management Planned: LMA  Additional Equipment:   Intra-op Plan:   Post-operative Plan:   Informed Consent: I have reviewed the patients History and Physical, chart, labs and discussed the procedure including the risks, benefits and alternatives for the proposed anesthesia with the patient or authorized representative who has indicated his/her understanding and acceptance.   Dental Advisory Given  Plan Discussed with: CRNA and Surgeon  Anesthesia Plan Comments:        Anesthesia Quick Evaluation

## 2014-07-15 NOTE — Anesthesia Postprocedure Evaluation (Signed)
  Anesthesia Post-op Note  Patient: Brianna Barnes  Procedure(s) Performed: Procedure(s) (LRB): DILATATION AND CURETTAGE /HYSTEROSCOPY (N/A)  Patient Location: PACU  Anesthesia Type: General  Level of Consciousness: awake and alert   Airway and Oxygen Therapy: Patient Spontanous Breathing  Post-op Pain: mild  Post-op Assessment: Post-op Vital signs reviewed, Patient's Cardiovascular Status Stable, Respiratory Function Stable, Patent Airway and No signs of Nausea or vomiting  Last Vitals:  Filed Vitals:   07/15/14 0800  BP: 151/74  Pulse: 68  Temp:   Resp: 14    Post-op Vital Signs: stable   Complications: No apparent anesthesia complications

## 2014-07-15 NOTE — Anesthesia Procedure Notes (Signed)
Procedure Name: LMA Insertion Date/Time: 07/15/2014 7:27 AM Performed by: Bethena Roys T Pre-anesthesia Checklist: Patient identified, Emergency Drugs available, Suction available and Patient being monitored Patient Re-evaluated:Patient Re-evaluated prior to inductionOxygen Delivery Method: Circle System Utilized Preoxygenation: Pre-oxygenation with 100% oxygen Intubation Type: IV induction Ventilation: Mask ventilation without difficulty LMA: LMA inserted LMA Size: 3.0 Number of attempts: 1 Airway Equipment and Method: Bite block Placement Confirmation: positive ETCO2 Tube secured with: Tape Dental Injury: Teeth and Oropharynx as per pre-operative assessment

## 2014-07-15 NOTE — Discharge Instructions (Signed)

## 2014-07-15 NOTE — Transfer of Care (Signed)
Immediate Anesthesia Transfer of Care Note  Patient: Brianna Barnes  Procedure(s) Performed: Procedure(s): DILATATION & CURETTAGE/HYSTEROSCOPY WITH MYOSURE (N/A)  Patient Location: PACU  Anesthesia Type:General  Level of Consciousness: sedated and responds to stimulation  Airway & Oxygen Therapy: Patient Spontanous Breathing and Patient connected to nasal cannula oxygen  Post-op Assessment: Report given to RN  Post vital signs: Reviewed and stable  Last Vitals:  Filed Vitals:   07/15/14 0750  BP:   Pulse:   Temp: 36.6 C  Resp:     Complications: No apparent anesthesia complications

## 2014-07-16 ENCOUNTER — Encounter (HOSPITAL_BASED_OUTPATIENT_CLINIC_OR_DEPARTMENT_OTHER): Payer: Self-pay | Admitting: Obstetrics and Gynecology

## 2014-07-29 ENCOUNTER — Ambulatory Visit: Payer: Medicare HMO | Admitting: Internal Medicine

## 2014-09-09 ENCOUNTER — Encounter: Payer: Self-pay | Admitting: Internal Medicine

## 2014-09-09 ENCOUNTER — Ambulatory Visit (INDEPENDENT_AMBULATORY_CARE_PROVIDER_SITE_OTHER): Payer: Medicare HMO | Admitting: Internal Medicine

## 2014-09-09 VITALS — BP 130/70 | HR 88 | Ht <= 58 in | Wt 118.6 lb

## 2014-09-09 DIAGNOSIS — K219 Gastro-esophageal reflux disease without esophagitis: Secondary | ICD-10-CM | POA: Diagnosis not present

## 2014-09-09 DIAGNOSIS — K7469 Other cirrhosis of liver: Secondary | ICD-10-CM

## 2014-09-09 DIAGNOSIS — I868 Varicose veins of other specified sites: Secondary | ICD-10-CM

## 2014-09-09 NOTE — Progress Notes (Signed)
Subjective:    Patient ID: Brianna Barnes, female    DOB: [birth year], 81 y.o.   MRN: [id number]  HPI Brianna Barnes is a 81 year old female with past medical history of cryptogenic cirrhosis with portal hypertension (splenomegaly and small varices), CBD stone status post ERCP with sphincterotomy and stone extraction, arthritis and GERD who is here for follow-up. She is here with her daughter. She was last seen in March 2016. At that visit she reported vaginal bleeding. She was seen by gynecology and underwent a D&C which showed benign tissue. Vaginal bleeding has stopped. She reports from a GI perspective she is feeling well. She denies abdominal pain. No nausea or vomiting. Good appetite. No weight loss. No jaundice, swelling in her abdomen or legs, itching or bleeding. Bowel movements a been regular without blood in her stool or melena. Heartburn and GERD symptoms are controlled on omeprazole once daily. She had another pessary placed and is doing well. She is back on her baby aspirin.   Review of Systems  as per history of present illness, otherwise negative  Current Medications, Allergies, Past Medical History, Past Surgical History, Family History and Social History were reviewed in [employer] record.     Objective:   Physical Exam BP 130/70 mmHg  Pulse 88  Ht 4\' 9"  (1.448 m)  Wt 118 lb 9.6 oz (53.797 kg)  BMI 25.66 kg/m2 Constitutional: Well-developed and well-nourished. No distress. HEENT: Normocephalic and atraumatic. Oropharynx is clear and moist. No oropharyngeal exudate. Conjunctivae are normal.  No scleral icterus. Neck: Neck supple. Trachea midline. Cardiovascular: Normal rate, regular rhythm and intact distal pulses. No M/R/G Pulmonary/chest: Effort normal and breath sounds normal. No wheezing, rales or rhonchi. Abdominal: Soft, nontender, nondistended. Bowel sounds active throughout. There are no masses palpable. No hepatosplenomegaly. Extremities: no  clubbing, cyanosis, or edema Lymphadenopathy: No cervical adenopathy noted. Neurological: Alert and oriented to person place and time. Skin: Skin is warm and dry. No rashes noted. Psychiatric: Normal mood and affect. Behavior is normal.  CBC    Component Value Date/Time   WBC 3.7* 06/04/2014 1014   RBC 4.40 06/04/2014 1014   HGB 12.2 07/15/2014 0654   HCT 36.0 07/15/2014 0654   PLT 163.0 06/04/2014 1014   MCV 87.8 06/04/2014 1014   MCH 30.8 08/26/2011 0635   MCHC 34.4 06/04/2014 1014   RDW 13.7 06/04/2014 1014   LYMPHSABS 1.0 06/04/2014 1014   MONOABS 0.5 06/04/2014 1014   EOSABS 0.1 06/04/2014 1014   BASOSABS 0.0 06/04/2014 1014    CMP     Component Value Date/Time   NA 140 07/15/2014 0654   K 3.7 07/15/2014 0654   CL 104 06/04/2014 1014   CO2 31 06/04/2014 1014   GLUCOSE 93 07/15/2014 0654   BUN 10 06/04/2014 1014   CREATININE 0.54 06/04/2014 1014   CALCIUM 9.1 06/04/2014 1014   PROT 6.5 06/04/2014 1014   ALBUMIN 3.9 06/04/2014 1014   AST 36 06/04/2014 1014   ALT 23 06/04/2014 1014   ALKPHOS 88 06/04/2014 1014   BILITOT 0.7 06/04/2014 1014   GFRNONAA 85* 08/26/2011 0635   GFRAA >90 08/26/2011 0635    Lab Results  Component Value Date   INR 1.1* 06/04/2014   INR 1.0 09/21/2013   INR 1.1* 04/02/2013     MRI ABDOMEN WITHOUT AND WITH CONTRAST   TECHNIQUE: Multiplanar multisequence MR imaging of the abdomen was performed both before and after the administration of intravenous contrast.   CONTRAST:  50mL  MULTIHANCE GADOBENATE DIMEGLUMINE 529 MG/ML IV SOLN   COMPARISON:  MRI 11/28/2012   FINDINGS: Lower chest:  No pleural effusion.   Hepatobiliary: There is hypertrophy of the lateral segment of left lobe of liver as well as the caudate lobe of liver. The contour the liver is irregular. There is no suspicious enhancing liver abnormality identified to suggest hepatoma. Several cysts are identified the largest of which measures 1.1 cm, image 8 of series  9 previous cholecystectomy. No biliary dilatation identified. No evidence for choledocholithiasis. The pancreatic duct has a normal caliber.   Pancreas: Normal appearance of the pancreas.   Spleen: The spleen measures 9.2 cm in cranial caudal dimension. Unchanged from previous exam.   Adrenals/Urinary Tract: The adrenal glands are both normal. Unremarkable appearance of the kidneys.   Stomach/Bowel: Extensive esophageal and gastric varices are identified, images 22 and 32 of series 1204. The stomach is otherwise normal in appearance. The small bowel loops are unremarkable. The visualized portions of the colon are normal.   Vascular/Lymphatic: Normal appearance of the abdominal aorta. The portal vein is patent but appears increased in diameter measuring 1 cm, image 32 of series 1204. The splenic vein is also patent. Extensive upper abdominal varices as above. There is recanalization of the umbilical vein, image 33 of series 1204.   Other: No ascites or focal fluid collections identified within the upper abdomen.   Musculoskeletal: No abnormal areas of signal from within the bone marrow noted.   IMPRESSION: 1. Morphologic features of liver compatible with cirrhosis. 2. Varices.     Electronically Signed   By: Kerby Moors M.D.   On: 06/24/2014 15:31       Assessment & Plan:   81 year old female with past medical history of cryptogenic cirrhosis with portal hypertension (splenomegaly and small varices), CBD stone status post ERCP with sphincterotomy and stone extraction, arthritis and GERD who is here for follow-up.   1. Cryptogenic cirrhosis with portal hypertension -- extremely well compensated liver disease. At last recheck albumen was normal as were platelets. MRI abdomen reviewed including with the patient does show morphologic features compatible with cirrhosis and also abdominal varices. No evidence of variceal bleeding. No ascites or edema. --Variceal screening --  last endoscopic evaluation 2014, small varices only. Beta blocker considered and discussed again today deferred due to fall risk. Given lack of decompensated disease variceal hemorrhage felt unlikely at this time. --[place] screening -- up-to-date with MRI, repeat ultrasound in April 2017 (one year from cross-sectional imaging) --Vaccination -- up-to-date repeat flu vaccine in the fall --No evidence of hepatic encephalopathy or volume overload  2. Vaginal bleeding -- resolved status post D&C with benign findings  3. GERD -- well-controlled continue omeprazole 20 mg once daily  Follow-up in 6 months, sooner if necessary 25 minutes spent with the patient today

## 2014-09-09 NOTE — Patient Instructions (Signed)
Continue current medications  Call and schedule follow up in 6 months.  Please have labs done 1 week prior to appointment.

## 2014-09-23 ENCOUNTER — Encounter: Payer: Self-pay | Admitting: Family Medicine

## 2014-09-23 ENCOUNTER — Ambulatory Visit (INDEPENDENT_AMBULATORY_CARE_PROVIDER_SITE_OTHER): Payer: Medicare HMO | Admitting: Family Medicine

## 2014-09-23 VITALS — BP 108/59 | HR 84 | Temp 97.7°F | Ht <= 58 in | Wt 118.5 lb

## 2014-09-23 DIAGNOSIS — L03319 Cellulitis of trunk, unspecified: Secondary | ICD-10-CM

## 2014-09-23 DIAGNOSIS — L02219 Cutaneous abscess of trunk, unspecified: Secondary | ICD-10-CM | POA: Diagnosis not present

## 2014-09-23 DIAGNOSIS — B882 Other arthropod infestations: Secondary | ICD-10-CM | POA: Diagnosis not present

## 2014-09-23 MED ORDER — DOXYCYCLINE HYCLATE 100 MG PO TABS: 100.0000 mg | ORAL_TABLET | Freq: Two times a day (BID) | ORAL | Status: DC

## 2014-09-23 NOTE — Progress Notes (Signed)
Dr. Frederico Hamman T. Ticara Waner, MD, [facility] [place], [address] Phone: [phone] Fax: [phone]  09/23/2014  Patient: Brianna Barnes, MRN: [id number], DOB: 09-02-1928, 81 y.o.  Primary Physician:  Eliezer Lofts, MD  Chief Complaint: Insect Bite; Fever; and Pain in Joints  Subjective:   Brianna Barnes is a 81 y.o. very pleasant female patient who presents with the following:  Proximally 10 days ago, the patient and her daughter dug out a tick from her lower back on the left, and this was bothering her quite a bit. Since then it is gotten worse, it is somewhat red around this area, and she also has had some systemic fevers, and polyarthralgias involving multiple joints, neck and back. She is somewhat better today, but still does not feel perfectly well.  Feeling better today, legs were bothering her some.  Feeling bad, feverish and achy.   Pulled out 1/2 of tick, thinks got the rest of it out.  About 10 days ago pulled out.   Mark on back with redness.   Past Medical History, Surgical History, Social History, Family History, Problem List, Medications, and Allergies have been reviewed and updated if relevant.  Patient Active Problem List   Diagnosis Date Noted  . Acute sinusitis 03/11/2014  . Malnourished 01/25/2014  . Counseling regarding end of life decision making 01/25/2014  . Choledocholithiasis 11/30/2012  . Cryptogenic cirrhosis 11/22/2012  . Family history of early CAD 10/29/2010  . Vitamin D deficiency 06/24/2009  . ESSENTIAL HYPERTENSION, BENIGN 06/13/2009  . Hyperlipidemia 06/06/2009  . Iron deficiency anemia 09/17/2008  . GERD 09/17/2008  . PEPTIC ULCER DISEASE 09/17/2008  . UTEROVAGINAL PROLAPSE, INCOMPLETE 09/17/2008  . OSTEOARTHRITIS 09/17/2008  . Osteoporosis 09/17/2008  . ARTERIOVENOUS MALFORMATION 09/17/2008  . POSITIVE TB SKIN TEST, WITHOUT TUBERCULOSIS 09/17/2008  . COLONIC POLYPS, BENIGN, HX  OF 09/17/2008    Past Medical History  Diagnosis Date  . Osteoarthritis     hands, knees, and low back  . GERD (gastroesophageal reflux disease)   . Osteoporosis   . History of positive PPD     + TB SKIN TEST WITHOUT TB  . PUD (peptic ulcer disease)   . History of colonoscopy with polypectomy     ADENOMATOUS AND HYPERPLASIA POLYPS  (2008  &  2010)  . Cirrhosis, cryptogenic     FOLLOWED BY DR PYRTLE-- LOV  IN EPIC--  STABLE PER NOTE  . Iron deficiency anemia   . Uterovaginal prolapse, incomplete   . Hypertension, portal     SECONDARY TO CRYPTOGENIC CIRRHOSIS  . Hyperlipidemia   . Wears glasses   . Wears hearing aid     left ear only  . Loss of hearing     right ear  . Choledocholithiasis   . Esophageal varix   . Internal hemorrhoids   . History of hepatitis C     Past Surgical History  Procedure Laterality Date  . External ear surgery Left 1980  . Cholecystectomy  08/25/2011    Procedure: LAPAROSCOPIC CHOLECYSTECTOMY WITH INTRAOPERATIVE CHOLANGIOGRAM;  Surgeon: Zenovia Jarred, MD;  Location: [facility];  Service: General;  Laterality: N/A;  . Endoscopic retrograde cholangiopancreatography (ercp) with propofol N/A 11/30/2012    Procedure: ENDOSCOPIC RETROGRADE CHOLANGIOPANCREATOGRAPHY (ERCP) WITH PROPOFOL;  Surgeon: Milus Banister, MD;  Location: [facility];  Service: Endoscopy;  Laterality: N/A;  . Lumbar disc surgery  1990  . Appendectomy  1960'S  . Knee arthroscopy w/  meniscectomy Right 06-19-2002  . Removal of urinary sling N/A 09/13/2013    Procedure: REMOVAL OF RETAINED PESSARY;  Surgeon: Jorja Loa, MD;  Location: [facility];  Service: Urology;  Laterality: N/A;  . Hysteroscopy w/d&c N/A 07/15/2014    Procedure: DILATATION AND CURETTAGE /HYSTEROSCOPY;  Surgeon: Molli Posey, MD;  Location: [facility];  Service: Gynecology;  Laterality: N/A;    History   Social History  . Marital Status: Widowed    Spouse Name: N/A  .  Number of Children: 3  . Years of Education: N/A   Occupational History  . Retired, [employer]    Social History Main Topics  . Smoking status: Never Smoker   . Smokeless tobacco: Never Used  . Alcohol Use: No  . Drug Use: No  . Sexual Activity: Not on file   Other Topics Concern  . Not on file   Social History Narrative        Family History  Problem Relation Age of Onset  . Heart disease Mother   . Heart disease Father 68  . Hyperlipidemia Father   . Diabetes Father   . Cirrhosis Sister   . Diabetes Brother   . Heart disease Brother     No Known Allergies  Medication list reviewed and updated in full in [facility].  ROS: GEN: Acute illness details above GI: Tolerating PO intake GU: maintaining adequate hydration and urination Pulm: No SOB Interactive and getting along well at home.  Otherwise, ROS is as per the HPI.   Objective:   BP 108/59 mmHg  Pulse 84  Temp(Src) 97.7 F (36.5 C) (Oral)  Ht 4\' 9"  (1.448 m)  Wt 118 lb 8 oz (53.751 kg)  BMI 25.64 kg/m2   GEN: WDWN, NAD, Non-toxic, A & O x 3 HEENT: Atraumatic, Normocephalic. Neck supple. No masses, No LAD. Ears and Nose: No external deformity. CV: RRR, No M/G/R. No JVD. No thrill. No extra heart sounds. PULM: CTA B, no wheezes, crackles, rhonchi. No retractions. No resp. distress. No accessory muscle use. EXTR: No c/c/e NEURO Normal gait.  PSYCH: Normally interactive. Conversant. Not depressed or anxious appearing.  Calm demeanor.    SKIN: Left lower back there is an area that is flat and reddened and minimally warm to touch. There is no fluctuance or induration. This area was marked with a permanent marker approximately 7 cm across   Laboratory and Imaging Data:  Assessment and Plan:   Tick-borne disease  Cellulitis and abscess of trunk  Presented tickborne illness, cannot exclude cellulitis as the primary driving factor in fever, systemic illness. We will treat both with  doxycycline.  Follow-up: prn  New Prescriptions   DOXYCYCLINE (VIBRA-TABS) 100 MG TABLET    Take 1 tablet (100 mg total) by mouth 2 (two) times daily.   No orders of the defined types were placed in this encounter.    Signed,  Brianna Deed. Vaishnav Demartin, MD   Patient's Medications  New Prescriptions   DOXYCYCLINE (VIBRA-TABS) 100 MG TABLET    Take 1 tablet (100 mg total) by mouth 2 (two) times daily.  Previous Medications   ACETAMINOPHEN (TYLENOL) 325 MG TABLET    Take 325 mg by mouth every 6 (six) hours as needed. For pain   ASPIRIN EC 81 MG TABLET    Take 81 mg by mouth daily.   CALCIUM-VITAMIN D (OSCAL WITH D) 500-200 MG-UNIT PER TABLET    Take 1 tablet by mouth daily.  CHOLECALCIFEROL (VITAMIN D3) 400 UNITS CAPS    Take 400 mg by mouth 2 (two) times daily.     IRON COMBINATIONS (IRON COMPLEX PO)    Take 1 tablet by mouth daily.   LATANOPROST (XALATAN) 0.005 % OPHTHALMIC SOLUTION       OMEPRAZOLE (PRILOSEC) 20 MG CAPSULE    Take 1-2 capsules (20-40 mg total) by mouth daily.   VITAMIN E 400 UNIT CAPSULE    Take 400 Units by mouth daily.  Modified Medications   No medications on file  Discontinued Medications   No medications on file

## 2014-09-23 NOTE — Progress Notes (Signed)
Pre visit review using our clinic review tool, if applicable. No additional management support is needed unless otherwise documented below in the visit note. 

## 2015-01-09 ENCOUNTER — Other Ambulatory Visit: Payer: Self-pay

## 2015-01-09 DIAGNOSIS — Z1231 Encounter for screening mammogram for malignant neoplasm of breast: Secondary | ICD-10-CM

## 2015-01-22 ENCOUNTER — Other Ambulatory Visit (INDEPENDENT_AMBULATORY_CARE_PROVIDER_SITE_OTHER): Payer: Medicare HMO

## 2015-01-22 ENCOUNTER — Telehealth: Payer: Self-pay | Admitting: Family Medicine

## 2015-01-22 DIAGNOSIS — I10 Essential (primary) hypertension: Secondary | ICD-10-CM

## 2015-01-22 DIAGNOSIS — E559 Vitamin D deficiency, unspecified: Secondary | ICD-10-CM

## 2015-01-22 DIAGNOSIS — D509 Iron deficiency anemia, unspecified: Secondary | ICD-10-CM | POA: Diagnosis not present

## 2015-01-22 DIAGNOSIS — E785 Hyperlipidemia, unspecified: Secondary | ICD-10-CM | POA: Diagnosis not present

## 2015-01-22 LAB — COMPREHENSIVE METABOLIC PANEL
ALT: 19 U/L (ref 0–35)
AST: 31 U/L (ref 0–37)
Albumin: 3.8 g/dL (ref 3.5–5.2)
Alkaline Phosphatase: 94 U/L (ref 39–117)
BUN: 11 mg/dL (ref 6–23)
CO2: 29 mEq/L (ref 19–32)
Calcium: 9.4 mg/dL (ref 8.4–10.5)
Chloride: 103 mEq/L (ref 96–112)
Creatinine, Ser: 0.59 mg/dL (ref 0.40–1.20)
GFR: 103.19 mL/min (ref >60.00)
GLUCOSE: 97 mg/dL (ref 70–99)
POTASSIUM: 4 meq/L (ref 3.5–5.1)
Sodium: 139 mEq/L (ref 135–145)
Total Bilirubin: 0.8 mg/dL (ref 0.2–1.2)
Total Protein: 6.2 g/dL (ref 6.0–8.3)

## 2015-01-22 LAB — LIPID PANEL
CHOL/HDL RATIO: 3
Cholesterol: 189 mg/dL (ref 0–200)
HDL: 57.1 mg/dL (ref >39.00)
LDL Cholesterol: 120 mg/dL — ABNORMAL HIGH (ref 0–99)
NONHDL: 131.72
Triglycerides: 60 mg/dL (ref 0.0–149.0)
VLDL: 12 mg/dL (ref 0.0–40.0)

## 2015-01-22 LAB — CBC WITH DIFFERENTIAL/PLATELET
Basophils Absolute: 0 10*3/uL (ref 0.0–0.1)
Basophils Relative: 0.6 % (ref 0.0–3.0)
EOS ABS: 0.2 10*3/uL (ref 0.0–0.7)
Eosinophils Relative: 5.1 % — ABNORMAL HIGH (ref 0.0–5.0)
HCT: 39 % (ref 36.0–46.0)
Hemoglobin: 13 g/dL (ref 12.0–15.0)
Lymphocytes Relative: 32.8 % (ref 12.0–46.0)
Lymphs Abs: 1.3 10*3/uL (ref 0.7–4.0)
MCHC: 33.4 g/dL (ref 30.0–36.0)
MCV: 90.8 fl (ref 78.0–100.0)
MONO ABS: 0.5 10*3/uL (ref 0.1–1.0)
Monocytes Relative: 11.3 % (ref 3.0–12.0)
NEUTROS PCT: 50.2 % (ref 43.0–77.0)
Neutro Abs: 2 10*3/uL (ref 1.4–7.7)
Platelets: 178 10*3/uL (ref 150.0–400.0)
RBC: 4.3 Mil/uL (ref 3.87–5.11)
RDW: 13.6 % (ref 11.5–15.5)
WBC: 4.1 10*3/uL (ref 4.0–10.5)

## 2015-01-22 LAB — VITAMIN D 25 HYDROXY (VIT D DEFICIENCY, FRACTURES): VITD: 40.63 ng/mL (ref 30.00–100.00)

## 2015-01-22 NOTE — Telephone Encounter (Signed)
-----   Message from Ellamae Sia sent at 01/20/2015  4:07 PM EST ----- Regarding: Lab orders for Wednesday, 11.9.16 Patient is scheduled for CPX labs, please order future labs, Thanks , Karna Christmas

## 2015-01-23 ENCOUNTER — Ambulatory Visit: Payer: Medicare HMO

## 2015-01-28 ENCOUNTER — Ambulatory Visit (INDEPENDENT_AMBULATORY_CARE_PROVIDER_SITE_OTHER): Payer: Medicare HMO | Admitting: Family Medicine

## 2015-01-28 ENCOUNTER — Encounter: Payer: Self-pay | Admitting: Family Medicine

## 2015-01-28 VITALS — BP 127/81 | HR 82 | Temp 98.3°F | Ht 58.5 in | Wt 116.2 lb

## 2015-01-28 DIAGNOSIS — K7469 Other cirrhosis of liver: Secondary | ICD-10-CM

## 2015-01-28 DIAGNOSIS — I10 Essential (primary) hypertension: Secondary | ICD-10-CM | POA: Diagnosis not present

## 2015-01-28 DIAGNOSIS — Z0000 Encounter for general adult medical examination without abnormal findings: Secondary | ICD-10-CM

## 2015-01-28 DIAGNOSIS — E785 Hyperlipidemia, unspecified: Secondary | ICD-10-CM | POA: Diagnosis not present

## 2015-01-28 DIAGNOSIS — Z7189 Other specified counseling: Secondary | ICD-10-CM

## 2015-01-28 DIAGNOSIS — E559 Vitamin D deficiency, unspecified: Secondary | ICD-10-CM | POA: Diagnosis not present

## 2015-01-28 NOTE — Progress Notes (Signed)
Pre visit review using our clinic review tool, if applicable. No additional management support is needed unless otherwise documented below in the visit note. 

## 2015-01-28 NOTE — Assessment & Plan Note (Signed)
Resolved

## 2015-01-28 NOTE — Patient Instructions (Signed)
Keep up the great work on healthy eating and regular activity as able.

## 2015-01-28 NOTE — Assessment & Plan Note (Signed)
HCPOA: Carin Primrose Has living will,  Full code

## 2015-01-28 NOTE — Assessment & Plan Note (Addendum)
Well controlled. On no med.

## 2015-01-28 NOTE — Progress Notes (Signed)
I have personally reviewed the Medicare Annual Wellness questionnaire and have noted 1. The patient's medical and social history 2. Their use of alcohol, tobacco or illicit drugs 3. Their current medications and supplements 4. The patient's functional ability including ADL's, fall risks, home safety risks and hearing or visual             impairment. 5. Diet and physical activities 6. Evidence for depression or mood disorders 7.         Updated provider list Cognitive evaluation was performed and recorded on pt medicare questionnaire form. The patients weight, height, BMI and visual acuity have been recorded in the chart  I have made referrals, counseling and provided education to the patient based review of the above and I have provided the pt with a written personalized care plan for preventive services.     Cryptogenic cirrhosis: folled by Dr. Hilarie Fredrickson After lab testing no genetic or other cause was found to explain her cirrhosis. Her liver disease remains very well compensated, without evidence for volume overload, jaundice, encephalopathy. Recent upper endoscopy revealed grade 1 esophageal varices. We have discussed the addition of a nonselective beta blocker today at length. Given that her disease is well compensated and the varix was very small, we will not initiate beta blockers today. I am somewhat concerned that the addition of this medication may lead to more fatigue and put her more at risk for falling. Should her disease become decompensated, we can rediscuss beta blocker initiation at that time, because decompensated cirrhosis increases portal hypertension and likely degree of varices.   Choledocholithiasis status post ERCP With sphincterotomy and removal of CBD stone on 11/30/2012 Liver function tets improved after procedure. Followed by Dr. Hilarie Fredrickson, GI.  Hypertension: Well controlled on no medication.  BP Readings from Last 3 Encounters:  01/28/15 127/81  09/23/14 108/59   09/09/14 130/70  Chest pain with exertion:None  Edema: noneShort of breath:None  Average home BPs: not checking at home  Other issues: Sister age 47 diagnosed with CAD.Marland Kitchen Had CABG x 4.   Elevated Cholesterol: Well controlled previously on no medicaition.  Lab Results  Component Value Date   CHOL 189 01/22/2015   HDL 57.10 01/22/2015   LDLCALC 120* 01/22/2015   LDLDIRECT 135.2 06/06/2009   TRIG 60.0 01/22/2015   CHOLHDL 3 01/22/2015   Vit D def, resolved s/p supplementation with rx dosing. On twice daily 400 mg D3    Using boost every day.  No red flags. Good appetitie Wt Readings from Last 3 Encounters:  01/28/15 116 lb 4 oz (52.731 kg)  09/23/14 118 lb 8 oz (53.751 kg)  09/09/14 118 lb 9.6 oz (53.797 kg)   No prediabetes. Lab Results  Component Value Date   HGBA1C 4.9 01/18/2014   GERD: uses prilosec occ 2 a day. Needs refill.   Reports that she has never smoked. She has never used smokeless tobacco. She reports that she does not drink alcohol or use illicit drugs .Social History /Family History/Past Medical History reviewed and updated if needed.   Review of Systems  Constitutional: Negative for fever and fatigue.  HENT: Negative for ear pain.  OCC Headache Eyes: Negative for pain.  Respiratory: Negative for cough, shortness of breath and wheezing.  Cardiovascular: Negative for chest pain, palpitations and leg swelling.  Gastrointestinal: negative for constipation. Negative for nausea, abdominal pain, diarrhea and blood in stool.  Genitourinary: Negative for dysuria, vaginal bleeding and vaginal pain.  Skin: Negative for rash.  Neurological: Negative  for dizziness, syncope and light-headedness.  Psychiatric/Behavioral: Negative for dysphoric mood. The patient is not nervous/anxious.  Objective:   Physical Exam  Constitutional: Vital signs are normal. She appears well-developed and well-nourished. She is cooperative. Non-toxic appearance. She does  not appear ill. No distress.  HENT: Head: Normocephalic.  Right Ear: Hearing, tympanic membrane, external ear and ear canal normal.  Left Ear: Hearing, tympanic membrane, external ear and ear canal normal.  Nose: Nose normal.  Eyes: Conjunctivae, EOM and lids are normal. Pupils are equal, round, and reactive to light. No foreign bodies found.  Neck: Trachea normal and normal range of motion. Neck supple. Carotid bruit is not present. No mass and no thyromegaly present. Neg spurlings Cardiovascular: Normal rate, regular rhythm, S1 normal, S2 normal, normal heart sounds and intact distal pulses. Exam reveals no gallop.  No murmur heard.  Pulmonary/Chest: Effort normal and breath sounds normal. No respiratory distress. She has no wheezes. She has no rhonchi. She has no rales.  Abdominal: Soft. Normal appearance and bowel sounds are normal. She exhibits no distension, no fluid wave, no abdominal bruit and no mass. There is no hepatosplenomegaly. There is no tenderness. There is no rebound, no guarding and no CVA tenderness. No hernia.  Genitourinary:Not performed this year.. Pelvic exam was Not performed. Lymphadenopathy:  She has no cervical adenopathy.  She has no axillary adenopathy.  Neurological: She is alert. She has normal strength. No cranial nerve deficit or sensory deficit.  Skin: Skin is warm, dry and intact. No rash noted.  Psychiatric: Her speech is normal and behavior is normal. Judgment normal. Her mood appears not anxious. Cognition and memory are normal. She does not exhibit a depressed mood.  No low back ttp, no focal ttp in hip or left leg, but decreased ROM in B hips, strength 5/5 B lower ext. Assessment & Plan:   Annual medicare Wellness: The patient's preventative maintenance and recommended screening tests for an annual wellness exam were reviewed in full today.  Brought up to date unless services declined.  Counselled on the importance of diet, exercise, and  its role in overall health and mortality.  The patient's FH and SH was reviewed, including their home life, tobacco status, and drug and alcohol status.   UPTD with Td and PNa, prevnar shingles vaccine.  Had flu at [facility].  Last DXA: 2011, on fosamax for 10 years, will stopped in 2013.  Remained stable on 2015 DEXA. Marland Kitchen Last colonoscopy: 2010, no repeat needed.  Mammogram 2015 nml, pt wishes to continue, schedueld 2016 Nonsmoker.  Will check lipids, vit D every other year.

## 2015-01-28 NOTE — Addendum Note (Signed)
Addended byEliezer Lofts E on: 01/28/2015 10:40 AM   Modules accepted: SmartSet

## 2015-01-28 NOTE — Assessment & Plan Note (Addendum)
Stable LFTs.  Pt asymptomatic.

## 2015-01-28 NOTE — Assessment & Plan Note (Signed)
At goal on no med. Check every other year.

## 2015-02-05 ENCOUNTER — Ambulatory Visit
Admission: RE | Admit: 2015-02-05 | Discharge: 2015-02-05 | Disposition: A | Discharge status: Home / Self Care | Payer: Medicare HMO | Source: Ambulatory Visit

## 2015-02-05 DIAGNOSIS — Z1231 Encounter for screening mammogram for malignant neoplasm of breast: Secondary | ICD-10-CM

## 2015-02-21 ENCOUNTER — Encounter: Payer: Self-pay | Admitting: Internal Medicine

## 2015-03-19 ENCOUNTER — Telehealth: Payer: Self-pay | Admitting: Family Medicine

## 2015-03-19 NOTE — Telephone Encounter (Signed)
Joice Lofts / Son-In-Law of patient say the patient has been having problems sleeping lately and she wanted to know if the doctor would prescribe her something for it.  She said if she needs to come back for an appt she will make one.  Please advise.

## 2015-03-20 MED ORDER — TRAZODONE HCL 50 MG PO TABS: 25.0000 mg | ORAL_TABLET | Freq: Every evening | ORAL | Status: DC | PRN

## 2015-03-20 NOTE — Telephone Encounter (Signed)
Has she tried trazodone?

## 2015-03-20 NOTE — Telephone Encounter (Signed)
Has she ever tried melatonin before? It is gentle and less sedation so less risk of fall?  Try OTC melatonin 3 mg to 10 mg at bedtime. Let me know if she has tried this without sucess.

## 2015-03-20 NOTE — Telephone Encounter (Signed)
Family notified as instructed by telephone.  They state she has tried the Melatonin and it did not help.

## 2015-03-20 NOTE — Telephone Encounter (Signed)
She has not tried Trazadone.

## 2015-03-31 ENCOUNTER — Other Ambulatory Visit: Payer: Self-pay | Admitting: Family Medicine

## 2015-05-02 ENCOUNTER — Encounter: Payer: Self-pay | Admitting: *Deleted

## 2015-05-06 ENCOUNTER — Other Ambulatory Visit (INDEPENDENT_AMBULATORY_CARE_PROVIDER_SITE_OTHER): Payer: Medicare HMO

## 2015-05-06 DIAGNOSIS — Z0000 Encounter for general adult medical examination without abnormal findings: Secondary | ICD-10-CM | POA: Diagnosis not present

## 2015-05-06 DIAGNOSIS — N390 Urinary tract infection, site not specified: Secondary | ICD-10-CM | POA: Diagnosis not present

## 2015-05-06 DIAGNOSIS — R3129 Other microscopic hematuria: Secondary | ICD-10-CM | POA: Diagnosis not present

## 2015-05-06 DIAGNOSIS — K7469 Other cirrhosis of liver: Secondary | ICD-10-CM | POA: Diagnosis not present

## 2015-05-06 DIAGNOSIS — N950 Postmenopausal bleeding: Secondary | ICD-10-CM | POA: Diagnosis not present

## 2015-05-06 LAB — COMPREHENSIVE METABOLIC PANEL
ALT: 19 U/L (ref 0–35)
AST: 32 U/L (ref 0–37)
Albumin: 3.8 g/dL (ref 3.5–5.2)
Alkaline Phosphatase: 93 U/L (ref 39–117)
BUN: 9 mg/dL (ref 6–23)
CO2: 30 meq/L (ref 19–32)
CREATININE: 0.57 mg/dL (ref 0.40–1.20)
Calcium: 9.2 mg/dL (ref 8.4–10.5)
Chloride: 105 mEq/L (ref 96–112)
GFR: 107.31 mL/min (ref >60.00)
Glucose, Bld: 107 mg/dL — ABNORMAL HIGH (ref 70–99)
Potassium: 4.7 mEq/L (ref 3.5–5.1)
Sodium: 139 mEq/L (ref 135–145)
Total Bilirubin: 0.7 mg/dL (ref 0.2–1.2)
Total Protein: 6.1 g/dL (ref 6.0–8.3)

## 2015-05-06 LAB — PROTIME-INR
INR: 1.1 ratio — ABNORMAL HIGH (ref 0.8–1.0)
Prothrombin Time: 11.7 s (ref 9.6–13.1)

## 2015-05-07 LAB — CBC WITH DIFFERENTIAL
BASOS: 1 %
Basophils Absolute: 0 10*3/uL (ref 0.0–0.2)
EOS (ABSOLUTE): 0.2 10*3/uL (ref 0.0–0.4)
Eos: 4 %
Hematocrit: 38.1 % (ref 34.0–46.6)
Hemoglobin: 12.8 g/dL (ref 11.1–15.9)
IMMATURE GRANS (ABS): 0 10*3/uL (ref 0.0–0.1)
Immature Granulocytes: 0 %
LYMPHS ABS: 1 10*3/uL (ref 0.7–3.1)
LYMPHS: 25 %
MCH: 29.9 pg (ref 26.6–33.0)
MCHC: 33.6 g/dL (ref 31.5–35.7)
MCV: 89 fL (ref 79–97)
Monocytes Absolute: 0.5 10*3/uL (ref 0.1–0.9)
Monocytes: 13 %
NEUTROS ABS: 2.3 10*3/uL (ref 1.4–7.0)
NEUTROS PCT: 57 %
RBC: 4.28 x10E6/uL (ref 3.77–5.28)
RDW: 14.1 % (ref 12.3–15.4)
WBC: 4.1 10*3/uL (ref 3.4–10.8)

## 2015-05-12 DIAGNOSIS — H401132 Primary open-angle glaucoma, bilateral, moderate stage: Secondary | ICD-10-CM | POA: Diagnosis not present

## 2015-05-13 ENCOUNTER — Ambulatory Visit (INDEPENDENT_AMBULATORY_CARE_PROVIDER_SITE_OTHER): Payer: Medicare Other | Admitting: Internal Medicine

## 2015-05-13 ENCOUNTER — Encounter: Payer: Self-pay | Admitting: Internal Medicine

## 2015-05-13 VITALS — BP 148/78 | HR 72 | Ht 58.25 in | Wt 117.1 lb

## 2015-05-13 DIAGNOSIS — K7469 Other cirrhosis of liver: Secondary | ICD-10-CM

## 2015-05-13 DIAGNOSIS — K219 Gastro-esophageal reflux disease without esophagitis: Secondary | ICD-10-CM | POA: Diagnosis not present

## 2015-05-13 NOTE — Progress Notes (Signed)
Subjective:    Patient ID: Brianna Barnes, female    DOB: 1929-01-19, 82 y.o.   MRN: [id number]  HPI Brianna Barnes is an 82 year old female with history of cryptogenic cirrhosis with portal hypertension (splenomegaly and small varices), history of CBD stones status post ERCP with extraction of stone, GERD and arthritis is here for follow-up. She is here with her son-in-law. She was last seen in June 2016. She continues to feel well. No issues with abdominal pain. Good appetite. No nausea or vomiting. No weight loss. No jaundice, ascites or lower extremity edema. No blood in her stool or melena. Bowel movements have been regular. No heartburn when using omeprazole.  Liver enzymes performed recently in stable  Review of Systems As per history of present illness, otherwise negative  Current Medications, Allergies, Past Medical History, Past Surgical History, Family History and Social History were reviewed in [employer] record.     Objective:   Physical Exam BP 148/78 mmHg  Pulse 72  Ht 4' 10.25" (1.48 m)  Wt 117 lb 2 oz (53.128 kg)  BMI 24.25 kg/m2 Constitutional: Well-developed and well-nourished. No distress. HEENT: Normocephalic and atraumatic.  Conjunctivae are normal.  No scleral icterus. Neck: Neck supple. Trachea midline. Cardiovascular: Normal rate, regular rhythm and intact distal pulses. No M/R/G Pulmonary/chest: Effort normal and breath sounds normal. No wheezing, rales or rhonchi. Abdominal: Soft, nontender, nondistended. Bowel sounds active throughout.  Extremities: no clubbing, cyanosis, or edema Lymphadenopathy: No cervical adenopathy noted. Neurological: Alert and oriented to person place and time.No asterixis Skin: Skin is warm and dry.  Psychiatric: Normal mood and affect. Behavior is normal.  CBC    Component Value Date/Time   WBC 4.1 05/06/2015 1003   WBC 4.1 01/22/2015 0817   RBC 4.28 05/06/2015 1003   RBC 4.30 01/22/2015 0817   HGB  13.0 01/22/2015 0817   HCT 38.1 05/06/2015 1003   HCT 39.0 01/22/2015 0817   PLT 178.0 01/22/2015 0817   MCV 89 05/06/2015 1003   MCV 90.8 01/22/2015 0817   MCH 29.9 05/06/2015 1003   MCH 30.8 08/26/2011 0635   MCHC 33.6 05/06/2015 1003   MCHC 33.4 01/22/2015 0817   RDW 14.1 05/06/2015 1003   RDW 13.6 01/22/2015 0817   LYMPHSABS 1.0 05/06/2015 1003   LYMPHSABS 1.3 01/22/2015 0817   MONOABS 0.5 01/22/2015 0817   EOSABS 0.2 05/06/2015 1003   EOSABS 0.2 01/22/2015 0817   BASOSABS 0.0 05/06/2015 1003   BASOSABS 0.0 01/22/2015 0817    CMP     Component Value Date/Time   NA 139 05/06/2015 1003   K 4.7 05/06/2015 1003   CL 105 05/06/2015 1003   CO2 30 05/06/2015 1003   GLUCOSE 107* 05/06/2015 1003   BUN 9 05/06/2015 1003   CREATININE 0.57 05/06/2015 1003   CALCIUM 9.2 05/06/2015 1003   PROT 6.1 05/06/2015 1003   ALBUMIN 3.8 05/06/2015 1003   AST 32 05/06/2015 1003   ALT 19 05/06/2015 1003   ALKPHOS 93 05/06/2015 1003   BILITOT 0.7 05/06/2015 1003   GFRNONAA 85* 08/26/2011 0635   GFRAA >90 08/26/2011 [id number]    Lab Results  Component Value Date   INR 1.1* 05/06/2015   INR 1.1* 06/04/2014   INR 1.0 09/21/2013       Assessment & Plan:  82 year old female with history of cryptogenic cirrhosis with portal hypertension (splenomegaly and small varices), history of CBD stones status post ERCP with extraction of stone, GERD and arthritis is here  for follow-up.  1. Cryptogenic cirrhosis -- very well compensated liver disease. Doing very well. No evidence of ascites, edema, jaundice, or bleeding --HCC screening -- cross-sectional imaging from April 2016 negative, repeat ultrasound for screening in April  --Variceal screening -- last endoscopic evaluation 2014, small varices only. Beta blocker deferred due to fall risk. Given lack of decompensated disease and other complication of portal hypertension, risk of variceal hemorrhage felt low currently --Vaccination up-to-date --No  evidence of hepatic encephalopathy or volume overload  2. GERD -- well-controlled continue omeprazole 20 mg daily  9 month follow-up, sooner if necessary

## 2015-05-13 NOTE — Patient Instructions (Signed)
Continue your current medications.  Follow up with Dr Hilarie Fredrickson in 9 months.  You have been scheduled for an abdominal ultrasound at [facility] (1st floor of hospital) on Tuesday, 06/24/15 at 8:30 am. Please arrive 15 minutes prior to your appointment for registration. Make certain not to have anything to eat or drink 6 hours prior to your appointment. Should you need to reschedule your appointment, please contact radiology at [phone]. This test typically takes about 30 minutes to perform.

## 2015-05-16 DIAGNOSIS — N816 Rectocele: Secondary | ICD-10-CM | POA: Diagnosis not present

## 2015-05-16 DIAGNOSIS — N952 Postmenopausal atrophic vaginitis: Secondary | ICD-10-CM | POA: Diagnosis not present

## 2015-06-24 ENCOUNTER — Ambulatory Visit (HOSPITAL_COMMUNITY): Payer: Medicare Other

## 2015-06-24 ENCOUNTER — Ambulatory Visit (HOSPITAL_COMMUNITY)
Admission: RE | Admit: 2015-06-24 | Discharge: 2015-06-24 | Disposition: A | Discharge status: Home / Self Care | Payer: Medicare Other | Source: Ambulatory Visit | Attending: Internal Medicine | Admitting: Internal Medicine

## 2015-06-24 DIAGNOSIS — K7460 Unspecified cirrhosis of liver: Secondary | ICD-10-CM | POA: Diagnosis not present

## 2015-06-24 DIAGNOSIS — K7469 Other cirrhosis of liver: Secondary | ICD-10-CM | POA: Diagnosis not present

## 2015-06-24 DIAGNOSIS — Z9049 Acquired absence of other specified parts of digestive tract: Secondary | ICD-10-CM | POA: Insufficient documentation

## 2015-08-08 DIAGNOSIS — Z0000 Encounter for general adult medical examination without abnormal findings: Secondary | ICD-10-CM | POA: Diagnosis not present

## 2015-08-08 DIAGNOSIS — N950 Postmenopausal bleeding: Secondary | ICD-10-CM | POA: Diagnosis not present

## 2015-08-14 ENCOUNTER — Encounter: Payer: Self-pay | Admitting: Podiatry

## 2015-08-14 ENCOUNTER — Ambulatory Visit (INDEPENDENT_AMBULATORY_CARE_PROVIDER_SITE_OTHER): Payer: Medicare Other | Admitting: Podiatry

## 2015-08-14 VITALS — BP 149/75 | HR 82 | Resp 18

## 2015-08-14 DIAGNOSIS — L03032 Cellulitis of left toe: Secondary | ICD-10-CM

## 2015-08-14 DIAGNOSIS — L600 Ingrowing nail: Secondary | ICD-10-CM | POA: Diagnosis not present

## 2015-08-14 MED ORDER — CEPHALEXIN 500 MG PO CAPS: 500.0000 mg | ORAL_CAPSULE | Freq: Two times a day (BID) | ORAL | Status: DC

## 2015-08-14 NOTE — Patient Instructions (Signed)

## 2015-08-14 NOTE — Progress Notes (Addendum)
   Subjective:    Patient ID: Brianna Barnes, female    DOB: 1928-06-28, 82 y.o.   MRN: [id number]  HPI  82 year old female presents the office today for concerns of her left big toe swelling, redness. His his been ongoing the last couple weeks has been worsening. There is been some pus coming from the nail border. She also have her feet checked today. She's had no recent treatment. No other complaints.  Review of Systems  All other systems reviewed and are negative.      Objective:   Physical Exam General: AAO x3, NAD  Dermatological: Left hallux toenails hypertrophic, dystrophic and incurvated with localized edema and erythema and a small amount of drainag/pus e is expressed from the nail borders. There is no ascending cellulitiss. There is no flexor crevice. No malodor.  Vascular: Dorsalis Pedis artery and Posterior Tibial artery pedal pulses are 2/4 bilateral with immedate capillary fill time. There is no pain with calf compression, swelling, warmth, erythema.   Neruologic: Grossly intact via light touch bilateral. Vibratory intact via tuning fork bilateral. Protective threshold with Semmes Wienstein monofilament intact to all pedal sites bilateral.   Musculoskeletal: Tenderness to left hallux toenail. No other areas of pinpoint tenderness or pain the vibratory sensation. MMT 5/5.  Gait: Unassisted, Nonantalgic.         Assessment & Plan:  Left hallux ingrown toenail with localized infection -Treatment options discussed including all alternatives, risks, and complications -Etiology of symptoms were discussed -At this time, the patient is requesting total nail removal with chemical matricectomy to the symptomatic portion of the nail. Risks and complications were discussed with the patient for which they understand and  verbally consent to the procedure. Under sterile conditions a total of 3 mL of a mixture of 2% lidocaine plain and 0.5% Marcaine plain was infiltrated in a hallux  block fashion. Once anesthetized, the skin was prepped in sterile fashion. A tourniquet was then applied. Next the hallux nail was then excised making sure to remove the entire offending nail border. Once the nails were ensured to be removed area was debrided and the underlying skin was intact. There is no purulence identified in the procedure. Next phenol was then applied under standard conditions and copiously irrigated. Silvadene was applied. A dry sterile dressing was applied. After application of the dressing the tourniquet was removed and there is found to be an immediate capillary refill time to the digit. The patient tolerated the procedure well any complications. Post procedure instructions were discussed the patient for which he verbally understood. Follow-up in one week for nail check or sooner if any problems are to arise. Discussed signs/symptoms of infection and directed to call the office immediately should any occur or go directly to the emergency room. In the meantime, encouraged to call the office with any questions, concerns, changes symptoms. -Keflex  Celesta Gentile, DPM  ADDENDUM: THE PATIENT UNDERWENT A PARTIAL NAIL AVULSION OF THE LEFT MEDIAL AND LATERAL NAIL FOLDS, NOT A TOTAL NAIL AVULSION

## 2015-08-21 ENCOUNTER — Encounter: Payer: Self-pay | Admitting: Podiatry

## 2015-08-21 ENCOUNTER — Ambulatory Visit (INDEPENDENT_AMBULATORY_CARE_PROVIDER_SITE_OTHER): Payer: Medicare Other | Admitting: Podiatry

## 2015-08-21 DIAGNOSIS — Z9889 Other specified postprocedural states: Secondary | ICD-10-CM

## 2015-08-21 DIAGNOSIS — L600 Ingrowing nail: Secondary | ICD-10-CM

## 2015-08-21 NOTE — Patient Instructions (Signed)

## 2015-08-24 NOTE — Progress Notes (Signed)
Patient ID: [name], female   DOB: 01/02/1929, 82 y.o.   MRN: [id number]  Subjective: [name] is a 82 y.o.  female returns to office today for follow up evaluation after having left medial and lateral hallux partial nail avulsions performed. Patient has been soaking using epsom satls and applying topical antibiotic covered with bandaid daily. Patient denies fevers, chills, nausea, vomiting. Denies any calf pain, chest pain, SOB.   Objective:  Vitals: Reviewed  General: Well developed, nourished, in no acute distress, alert and oriented x3   Dermatology: Skin is warm, dry and supple bilateral. Medial and lateral hallux nail borders appears to be clean, dry, with mild granular tissue and surrounding scab. There is decreased erythema and there is no edema, drainage/purulence. The remaining nails appear unremarkable at this time. There are no other lesions or other signs of infection present.  Neurovascular status: Intact. No lower extremity swelling; No pain with calf compression bilateral.  Musculoskeletal: Decreased tenderness to palpation of the medial and lateral hallux nail fold. Muscular strength within normal limits bilateral.   Assesement and Plan: S/p partial nail avulsion, doing well.   -Continue soaking in epsom salts twice a day followed by antibiotic ointment and a band-aid. Can leave uncovered at night. Continue this until completely healed.  -If the area has not healed in 2 weeks, call the office for follow-up appointment, or sooner if any problems arise.  -Monitor for any signs/symptoms of infection. Call the office immediately if any occur or go directly to the emergency room. Call with any questions/concerns.  Celesta Gentile, DPM

## 2015-09-04 ENCOUNTER — Ambulatory Visit (INDEPENDENT_AMBULATORY_CARE_PROVIDER_SITE_OTHER): Payer: Medicare Other | Admitting: Podiatry

## 2015-09-04 DIAGNOSIS — L03032 Cellulitis of left toe: Secondary | ICD-10-CM | POA: Diagnosis not present

## 2015-09-04 DIAGNOSIS — L600 Ingrowing nail: Secondary | ICD-10-CM | POA: Diagnosis not present

## 2015-09-08 NOTE — Progress Notes (Signed)
Patient ID: [name], female   DOB: 16-Feb-1929, 82 y.o.   MRN: [id number]  Subjective: Brianna Barnes is a 82 y.o. female returns to office today for follow up evaluation after having left medial and lateral hallux partial nail avulsions performed. She states that she is doing much better and she is in no pain. She is having no significant redness around the nail and no drainage or pus. Patient denies fevers, chills, nausea, vomiting. Denies any calf pain, chest pain, SOB.   Objective:  Vitals: Reviewed  General: Well developed, nourished, in no acute distress, alert and oriented x3   Dermatology: Skin is warm, dry and supple bilateral. Medial and lateral hallux nail borders appears to be clean, dry, with mild granular tissue and surrounding scab. There is evidence of resolving erythema and there is no edema, drainage/purulence. There are no other lesions or other signs of infection present.  Neurovascular status: Intact. No lower extremity swelling; No pain with calf compression bilateral.  Musculoskeletal: No tenderness to palpation of the medial and lateral hallux nail fold. Muscular strength within normal limits bilateral.   Assesement and Plan: S/p partial nail avulsion, doing well.   -Continue soaking in epsom salts twice a day followed by antibiotic ointment and a band-aid. Can leave uncovered at night. Continue this until completely healed. .  -Monitor for any signs/symptoms of infection. Call the office immediately if any occur or go directly to the emergency room. Call with any questions/concerns. -Follow-up as needed. Call the office with any questions or concerns.  Celesta Gentile, DPM

## 2015-11-10 DIAGNOSIS — H401132 Primary open-angle glaucoma, bilateral, moderate stage: Secondary | ICD-10-CM | POA: Diagnosis not present

## 2015-11-18 DIAGNOSIS — H401132 Primary open-angle glaucoma, bilateral, moderate stage: Secondary | ICD-10-CM | POA: Diagnosis not present

## 2015-11-19 DIAGNOSIS — R3915 Urgency of urination: Secondary | ICD-10-CM | POA: Diagnosis not present

## 2015-11-19 DIAGNOSIS — N819 Female genital prolapse, unspecified: Secondary | ICD-10-CM | POA: Diagnosis not present

## 2016-01-12 ENCOUNTER — Other Ambulatory Visit: Payer: Self-pay | Admitting: Family Medicine

## 2016-01-12 DIAGNOSIS — Z1231 Encounter for screening mammogram for malignant neoplasm of breast: Secondary | ICD-10-CM

## 2016-01-16 ENCOUNTER — Telehealth: Payer: Self-pay | Admitting: Family Medicine

## 2016-01-16 DIAGNOSIS — M810 Age-related osteoporosis without current pathological fracture: Secondary | ICD-10-CM

## 2016-01-16 DIAGNOSIS — D509 Iron deficiency anemia, unspecified: Secondary | ICD-10-CM

## 2016-01-16 DIAGNOSIS — E559 Vitamin D deficiency, unspecified: Secondary | ICD-10-CM

## 2016-01-16 DIAGNOSIS — E782 Mixed hyperlipidemia: Secondary | ICD-10-CM

## 2016-01-16 NOTE — Telephone Encounter (Signed)
-----   Message from Marchia Bond sent at 01/16/2016 11:56 AM EDT ----- Regarding: Cpx labs Fri  11/10, need orders. Thanks! :-) Please order  future cpx labs for pt's upcoming lab appt. Thanks Aniceto Boss

## 2016-01-26 ENCOUNTER — Other Ambulatory Visit (INDEPENDENT_AMBULATORY_CARE_PROVIDER_SITE_OTHER): Payer: Medicare Other

## 2016-01-26 DIAGNOSIS — E782 Mixed hyperlipidemia: Secondary | ICD-10-CM | POA: Diagnosis not present

## 2016-01-26 DIAGNOSIS — E559 Vitamin D deficiency, unspecified: Secondary | ICD-10-CM | POA: Diagnosis not present

## 2016-01-26 DIAGNOSIS — D509 Iron deficiency anemia, unspecified: Secondary | ICD-10-CM

## 2016-01-26 LAB — COMPREHENSIVE METABOLIC PANEL
ALBUMIN: 3.4 g/dL — AB (ref 3.5–5.2)
ALT: 20 U/L (ref 0–35)
AST: 34 U/L (ref 0–37)
Alkaline Phosphatase: 76 U/L (ref 39–117)
BILIRUBIN TOTAL: 0.7 mg/dL (ref 0.2–1.2)
BUN: 8 mg/dL (ref 6–23)
CALCIUM: 8.8 mg/dL (ref 8.4–10.5)
CHLORIDE: 105 meq/L (ref 96–112)
CO2: 30 mEq/L (ref 19–32)
Creatinine, Ser: 0.56 mg/dL (ref 0.40–1.20)
GFR: 109.33 mL/min (ref >60.00)
Glucose, Bld: 92 mg/dL (ref 70–99)
Potassium: 3.7 mEq/L (ref 3.5–5.1)
Sodium: 141 mEq/L (ref 135–145)
Total Protein: 5.7 g/dL — ABNORMAL LOW (ref 6.0–8.3)

## 2016-01-26 LAB — LIPID PANEL
CHOL/HDL RATIO: 4
Cholesterol: 179 mg/dL (ref 0–200)
HDL: 47 mg/dL (ref >39.00)
LDL Cholesterol: 112 mg/dL — ABNORMAL HIGH (ref 0–99)
NONHDL: 131.57
Triglycerides: 96 mg/dL (ref 0.0–149.0)
VLDL: 19.2 mg/dL (ref 0.0–40.0)

## 2016-01-26 LAB — VITAMIN D 25 HYDROXY (VIT D DEFICIENCY, FRACTURES): VITD: 34.59 ng/mL (ref 30.00–100.00)

## 2016-01-26 LAB — CBC WITH DIFFERENTIAL/PLATELET
Basophils Absolute: 0 10*3/uL (ref 0.0–0.1)
Basophils Relative: 0.7 % (ref 0.0–3.0)
EOS ABS: 0.2 10*3/uL (ref 0.0–0.7)
Eosinophils Relative: 4.6 % (ref 0.0–5.0)
HCT: 35.3 % — ABNORMAL LOW (ref 36.0–46.0)
HEMOGLOBIN: 11.8 g/dL — AB (ref 12.0–15.0)
LYMPHS PCT: 28.5 % (ref 12.0–46.0)
Lymphs Abs: 1.1 10*3/uL (ref 0.7–4.0)
MCHC: 33.4 g/dL (ref 30.0–36.0)
MCV: 87.5 fl (ref 78.0–100.0)
Monocytes Absolute: 0.5 10*3/uL (ref 0.1–1.0)
Monocytes Relative: 13.2 % — ABNORMAL HIGH (ref 3.0–12.0)
Neutro Abs: 2 10*3/uL (ref 1.4–7.7)
Neutrophils Relative %: 53 % (ref 43.0–77.0)
Platelets: 171 10*3/uL (ref 150.0–400.0)
RBC: 4.03 Mil/uL (ref 3.87–5.11)
RDW: 14.5 % (ref 11.5–15.5)
WBC: 3.8 10*3/uL — AB (ref 4.0–10.5)

## 2016-01-28 ENCOUNTER — Other Ambulatory Visit: Payer: Self-pay

## 2016-01-28 ENCOUNTER — Telehealth: Payer: Self-pay

## 2016-01-28 DIAGNOSIS — K7469 Other cirrhosis of liver: Secondary | ICD-10-CM

## 2016-01-28 NOTE — Telephone Encounter (Signed)
-----   Message from Algernon Huxley, RN sent at 01/01/2016  3:04 PM EDT ----- Regarding: FW: [place] screening-[place]   ----- Message ----- From: Algernon Huxley, RN Sent: 12/29/2015 To: Algernon Huxley, RN Subject: [facility] screening-US                               Pt needs repeat US in 6 mth

## 2016-01-28 NOTE — Telephone Encounter (Signed)
Pt scheduled for [place] of Abdomen at [facility] 02/02/16@9 :30am, pt to arrive there at 9:15am. Pt to be NPO after midnight. Pt aware of appt.

## 2016-01-30 ENCOUNTER — Ambulatory Visit (INDEPENDENT_AMBULATORY_CARE_PROVIDER_SITE_OTHER): Payer: Medicare Other | Admitting: Family Medicine

## 2016-01-30 ENCOUNTER — Encounter: Payer: Self-pay | Admitting: Family Medicine

## 2016-01-30 VITALS — BP 149/80 | HR 83 | Temp 97.5°F | Ht <= 58 in | Wt 106.8 lb

## 2016-01-30 DIAGNOSIS — D509 Iron deficiency anemia, unspecified: Secondary | ICD-10-CM

## 2016-01-30 DIAGNOSIS — E441 Mild protein-calorie malnutrition: Secondary | ICD-10-CM | POA: Diagnosis not present

## 2016-01-30 DIAGNOSIS — K7469 Other cirrhosis of liver: Secondary | ICD-10-CM

## 2016-01-30 DIAGNOSIS — M810 Age-related osteoporosis without current pathological fracture: Secondary | ICD-10-CM | POA: Diagnosis not present

## 2016-01-30 DIAGNOSIS — Z0000 Encounter for general adult medical examination without abnormal findings: Secondary | ICD-10-CM

## 2016-01-30 DIAGNOSIS — I10 Essential (primary) hypertension: Secondary | ICD-10-CM

## 2016-01-30 DIAGNOSIS — E782 Mixed hyperlipidemia: Secondary | ICD-10-CM

## 2016-01-30 DIAGNOSIS — H8110 Benign paroxysmal vertigo, unspecified ear: Secondary | ICD-10-CM

## 2016-01-30 DIAGNOSIS — E440 Moderate protein-calorie malnutrition: Secondary | ICD-10-CM

## 2016-01-30 DIAGNOSIS — F5104 Psychophysiologic insomnia: Secondary | ICD-10-CM

## 2016-01-30 MED ORDER — TRAZODONE HCL 50 MG PO TABS: 100.0000 mg | ORAL_TABLET | Freq: Every evening | ORAL | 5 refills | Status: DC | PRN

## 2016-01-30 NOTE — Progress Notes (Signed)
Pre visit review using our clinic review tool, if applicable. No additional management support is needed unless otherwise documented below in the visit note. 

## 2016-01-30 NOTE — Patient Instructions (Addendum)
Get back to boost daily, work on regular diet.  Drink water.  Stop  at front desk to set up bone density.  Can try trazodone 2 tabs at beditme.. Call if not improving as expected.

## 2016-01-30 NOTE — Assessment & Plan Note (Signed)
[place] to eval.pending.  Follwed by Pyrtle.

## 2016-01-30 NOTE — Progress Notes (Signed)
Subjective:    Patient ID: Brianna Barnes, female    DOB: [birth year], 82 y.o.   MRN: [id number]  HPI  I have personally reviewed the Medicare Annual Wellness questionnaire and have noted 1. The patient's medical and social history 2. Their use of alcohol, tobacco or illicit drugs 3. Their current medications and supplements 4. The patient's functional ability including ADL's, fall risks, home safety risks and hearing or visual             impairment. 5. Diet and physical activities 6. Evidence for depression or mood disorders 7.         Updated provider list Cognitive evaluation was performed and recorded on pt medicare questionnaire form. The patients weight, height, BMI and visual acuity have been recorded in the chart  I have made referrals, counseling and provided education to the patient based review of the above and I have provided the pt with a written personalized care plan for preventive services.    cryptogenic cirrhosis: stable LFTs and followed by GI Dr. Hilarie Fredrickson  Has upcoming [place] to eval.  Hypertension:    Tolerable control on no med. BP Readings from Last 3 Encounters:  01/30/16 (!) 149/80  08/14/15 (!) 149/75  05/13/15 (!) 148/78  Using medication without problems or lightheadedness:  She has noted some  dizziness and wobbliness on feet in last 2 days. Some runny nose, occurs when she stands up.  Not much water. Chest pain with exertion: None Edema: None Short of breath: None Average home BPs: Other issues:  Elevated Cholesterol:  Lab Results  Component Value Date   CHOL 179 01/26/2016   HDL 47.00 01/26/2016   LDLCALC 112 (H) 01/26/2016   LDLDIRECT 135.2 06/06/2009   TRIG 96.0 01/26/2016   CHOLHDL 4 01/26/2016  Using medications without problems: Muscle aches:  Diet compliance:boost only occ Exercise: walking dogs a round house, stretching Other complaints:   Malnutrition Low protein and albumin Body mass index is 22.31 kg/m.    Wt Readings from  Last 3 Encounters:  01/30/16 106 lb 12 oz (48.4 kg)  05/13/15 117 lb 2 oz (53.1 kg)  01/28/15 116 lb 4 oz (52.7 kg)   Anemia, slight worsening, was normal for years.  On iron for years.. No constipation.  Occ bleeding vaginally , had D/C last year with GYN  Pessary followed by Uro. Hematuria eval'd in past.   INsomnia, not improvined with trazodone. 1/2 or whole tablet.  Social History /Family History/Past Medical History reviewed and updated if needed.  Review of Systems  Constitutional: Negative for fatigue and fever.  HENT: Negative for ear pain.   Eyes: Negative for pain.  Respiratory: Negative for chest tightness and shortness of breath.   Cardiovascular: Negative for chest pain, palpitations and leg swelling.  Gastrointestinal: Negative for abdominal pain.  Genitourinary: Negative for dysuria.       Objective:   Physical Exam  Constitutional: Vital signs are normal. She appears well-developed. She appears cachectic. She is cooperative.  Non-toxic appearance. She does not appear ill. No distress.  Elderly thin female in NAD  HENT:  Head: Normocephalic.  Right Ear: Hearing, tympanic membrane, external ear and ear canal normal.  Left Ear: Hearing, tympanic membrane, external ear and ear canal normal.  Nose: Nose normal.  Eyes: Conjunctivae, EOM and lids are normal. Pupils are equal, round, and reactive to light. Lids are everted and swept, no foreign bodies found.  Neck: Trachea normal and normal range of motion. Neck supple.  Carotid bruit is not present. No thyroid mass and no thyromegaly present.  Cardiovascular: Normal rate, regular rhythm, S1 normal, S2 normal, normal heart sounds and intact distal pulses.  Exam reveals no gallop.   No murmur heard. Pulmonary/Chest: Effort normal and breath sounds normal. No respiratory distress. She has no wheezes. She has no rhonchi. She has no rales.  Abdominal: Soft. Normal appearance and bowel sounds are normal. She exhibits no  distension, no fluid wave, no abdominal bruit and no mass. There is no hepatosplenomegaly. There is no tenderness. There is no rebound, no guarding and no CVA tenderness. No hernia.  Lymphadenopathy:    She has no cervical adenopathy.    She has no axillary adenopathy.  Neurological: She is alert. She has normal strength. No cranial nerve deficit or sensory deficit.  Skin: Skin is warm, dry and intact. No rash noted.  Psychiatric: Her speech is normal and behavior is normal. Judgment normal. Her mood appears not anxious. Cognition and memory are normal. She does not exhibit a depressed mood.          Assessment & Plan:  The patient's preventative maintenance and recommended screening tests for an annual wellness exam were reviewed in full today. Brought up to date unless services declined.  Counselled on the importance of diet, exercise, and its role in overall health and mortality. The patient's FH and SH was reviewed, including their home life, tobacco status, and drug and alcohol status.   Vaccines: uptodate Pap/DVE: not indicated Mammo: scheduled Bone Density:2011, on fosamax for 10 years, will stopped in 2013.  Remained stable on 2015 DEXA, will setup now Colon: nml 2010 no repeat needed  Smoking Status: none

## 2016-01-30 NOTE — Assessment & Plan Note (Signed)
Work on improved diet.

## 2016-02-02 ENCOUNTER — Ambulatory Visit (HOSPITAL_COMMUNITY)
Admission: RE | Admit: 2016-02-02 | Discharge: 2016-02-02 | Disposition: A | Discharge status: Home / Self Care | Payer: Medicare Other | Source: Ambulatory Visit | Attending: Internal Medicine | Admitting: Internal Medicine

## 2016-02-02 DIAGNOSIS — Z9049 Acquired absence of other specified parts of digestive tract: Secondary | ICD-10-CM | POA: Insufficient documentation

## 2016-02-02 DIAGNOSIS — K760 Fatty (change of) liver, not elsewhere classified: Secondary | ICD-10-CM | POA: Diagnosis not present

## 2016-02-02 DIAGNOSIS — K7469 Other cirrhosis of liver: Secondary | ICD-10-CM | POA: Diagnosis not present

## 2016-02-09 ENCOUNTER — Ambulatory Visit
Admission: RE | Admit: 2016-02-09 | Discharge: 2016-02-09 | Disposition: A | Discharge status: Home / Self Care | Payer: Medicare Other | Source: Ambulatory Visit | Attending: Family Medicine | Admitting: Family Medicine

## 2016-02-09 DIAGNOSIS — Z1231 Encounter for screening mammogram for malignant neoplasm of breast: Secondary | ICD-10-CM | POA: Diagnosis not present

## 2016-02-11 ENCOUNTER — Ambulatory Visit
Admission: RE | Admit: 2016-02-11 | Discharge: 2016-02-11 | Disposition: A | Discharge status: Home / Self Care | Payer: Medicare Other | Source: Ambulatory Visit | Attending: Family Medicine | Admitting: Family Medicine

## 2016-02-11 DIAGNOSIS — M810 Age-related osteoporosis without current pathological fracture: Secondary | ICD-10-CM | POA: Diagnosis not present

## 2016-02-11 DIAGNOSIS — Z780 Asymptomatic menopausal state: Secondary | ICD-10-CM | POA: Diagnosis not present

## 2016-02-13 ENCOUNTER — Telehealth: Payer: Self-pay | Admitting: Family Medicine

## 2016-02-13 MED ORDER — ALENDRONATE SODIUM 70 MG PO TABS: 70.0000 mg | ORAL_TABLET | ORAL | 11 refills | Status: DC

## 2016-02-13 NOTE — Telephone Encounter (Signed)
-----   Message from Carter Kitten, [place] sent at 02/13/2016 11:30 AM EST ----- Mr. Wendi Snipes (son-in-law) notified as instructed by telephone.  He ask that we send in the prescription for Fosamax to [facility].

## 2016-04-03 DIAGNOSIS — F5104 Psychophysiologic insomnia: Secondary | ICD-10-CM | POA: Insufficient documentation

## 2016-04-03 NOTE — Assessment & Plan Note (Signed)
Stable control. 

## 2016-04-03 NOTE — Assessment & Plan Note (Signed)
Slight worsening, restart iron if tolerated. Follow over time.

## 2016-04-03 NOTE — Assessment & Plan Note (Addendum)
Trial of increased tramadol.

## 2016-04-03 NOTE — Assessment & Plan Note (Signed)
Encouraged increase protein intake.

## 2016-04-03 NOTE — Assessment & Plan Note (Signed)
Well controlled. Continue current medication.  

## 2016-05-18 DIAGNOSIS — H401132 Primary open-angle glaucoma, bilateral, moderate stage: Secondary | ICD-10-CM | POA: Diagnosis not present

## 2016-05-21 ENCOUNTER — Ambulatory Visit (INDEPENDENT_AMBULATORY_CARE_PROVIDER_SITE_OTHER): Payer: Medicare Other | Admitting: Family Medicine

## 2016-05-21 ENCOUNTER — Encounter: Payer: Self-pay | Admitting: Family Medicine

## 2016-05-21 DIAGNOSIS — J0100 Acute maxillary sinusitis, unspecified: Secondary | ICD-10-CM

## 2016-05-21 MED ORDER — AMOXICILLIN 500 MG PO CAPS: 1000.0000 mg | ORAL_CAPSULE | Freq: Two times a day (BID) | ORAL | 0 refills | Status: DC

## 2016-05-21 NOTE — Patient Instructions (Addendum)
Complete a course of antibiotics.  Start nasal saline spray 2-3 times daily.  Can use mucinex plain or DM to break up mucus.  Drink fluids and rest. Can try zyrtec at bedtime during allergy season.

## 2016-05-21 NOTE — Assessment & Plan Note (Signed)
>   3 weeks of symptoms.  Unilateral face pain  Treat with antibiotics, mucolytic and irrigation.

## 2016-05-21 NOTE — Progress Notes (Signed)
Pre visit review using our clinic review tool, if applicable. No additional management support is needed unless otherwise documented below in the visit note. 

## 2016-05-21 NOTE — Progress Notes (Signed)
Subjective:    Patient ID: Brianna Barnes, female    DOB: December 18, 1928, 83 y.o.   MRN: [id number]  Cough  This is a new problem. The current episode started 1 to 4 weeks ago ( 3 weeks ago). The problem has been unchanged (not getting better). The cough is productive of purulent sputum. Associated symptoms include ear congestion, ear pain, headaches, nasal congestion and a sore throat. Pertinent negatives include no chest pain, chills, fever, myalgias, postnasal drip, shortness of breath or wheezing. Associated symptoms comments: No sinus pain, left ear pain. Sinus pressure  She is fattigued. The symptoms are aggravated by lying down. Risk factors: nonsmoker. She has tried OTC cough suppressant (alkaseltzer plus) for the symptoms. Her past medical history is significant for environmental allergies. There is no history of asthma, bronchiectasis, bronchitis, COPD or emphysema.  Headache   Associated symptoms include coughing, dizziness, ear pain and a sore throat. Pertinent negatives include no fever.  Sore Throat   Associated symptoms include coughing, ear pain and headaches. Pertinent negatives include no shortness of breath.  Dizziness  Associated symptoms include coughing, headaches and a sore throat. Pertinent negatives include no chest pain, chills, fever or myalgias.   She felt shaky this morning, improved after few hours.   Sick contacts: daughter and son in law with the flu  Blood pressure 111/69, pulse 82, temperature 98.2 F (36.8 C), temperature source Oral, height 4\' 10"  (1.473 m), weight 110 lb 8 oz (50.1 kg), SpO2 97 %.  Review of Systems  Constitutional: Negative for chills and fever.  HENT: Positive for ear pain and sore throat. Negative for postnasal drip.   Respiratory: Positive for cough. Negative for shortness of breath and wheezing.   Cardiovascular: Negative for chest pain.  Musculoskeletal: Negative for myalgias.  Allergic/Immunologic: Positive for environmental  allergies.  Neurological: Positive for dizziness and headaches.       Objective:   Physical Exam  Constitutional: Vital signs are normal. She appears well-developed and well-nourished. She is cooperative.  Non-toxic appearance. She does not appear ill. No distress.  HENT:  Head: Normocephalic.  Right Ear: Hearing, tympanic membrane, external ear and ear canal normal. Tympanic membrane is not erythematous, not retracted and not bulging.  Left Ear: Hearing, external ear and ear canal normal. Tympanic membrane is not erythematous, not retracted and not bulging. A middle ear effusion is present.  Nose: Mucosal edema and rhinorrhea present. Right sinus exhibits no maxillary sinus tenderness and no frontal sinus tenderness. Left sinus exhibits maxillary sinus tenderness. Left sinus exhibits no frontal sinus tenderness.  Mouth/Throat: Uvula is midline, oropharynx is clear and moist and mucous membranes are normal. No oropharyngeal exudate, posterior oropharyngeal edema, posterior oropharyngeal erythema or tonsillar abscesses.  Eyes: Conjunctivae, EOM and lids are normal. Pupils are equal, round, and reactive to light. Lids are everted and swept, no foreign bodies found.  Neck: Trachea normal and normal range of motion. Neck supple. Carotid bruit is not present. No thyroid mass and no thyromegaly present.  Cardiovascular: Normal rate, regular rhythm, S1 normal, S2 normal, normal heart sounds, intact distal pulses and normal pulses.  Exam reveals no gallop and no friction rub.   No murmur heard. Pulmonary/Chest: Effort normal and breath sounds normal. No tachypnea. No respiratory distress. She has no decreased breath sounds. She has no wheezes. She has no rhonchi. She has no rales.  Neurological: She is alert.  Skin: Skin is warm, dry and intact. No rash noted.  Psychiatric: Her speech is  normal and behavior is normal. Judgment normal. Her mood appears not anxious. Cognition and memory are normal. She  does not exhibit a depressed mood.          Assessment & Plan:

## 2016-05-31 ENCOUNTER — Other Ambulatory Visit: Payer: Self-pay | Admitting: Family Medicine

## 2016-07-23 DIAGNOSIS — N819 Female genital prolapse, unspecified: Secondary | ICD-10-CM | POA: Diagnosis not present

## 2016-07-23 DIAGNOSIS — R3915 Urgency of urination: Secondary | ICD-10-CM | POA: Diagnosis not present

## 2016-10-12 ENCOUNTER — Ambulatory Visit (INDEPENDENT_AMBULATORY_CARE_PROVIDER_SITE_OTHER): Payer: Medicare Other | Admitting: Family Medicine

## 2016-10-12 ENCOUNTER — Encounter: Payer: Self-pay | Admitting: Family Medicine

## 2016-10-12 VITALS — BP 150/70 | HR 79 | Temp 98.4°F | Ht <= 58 in | Wt 115.5 lb

## 2016-10-12 DIAGNOSIS — H8110 Benign paroxysmal vertigo, unspecified ear: Secondary | ICD-10-CM | POA: Diagnosis not present

## 2016-10-12 DIAGNOSIS — I10 Essential (primary) hypertension: Secondary | ICD-10-CM

## 2016-10-12 NOTE — Assessment & Plan Note (Signed)
No red flags.  Start home densitization exercise.  Can use meclizine prn.

## 2016-10-12 NOTE — Assessment & Plan Note (Signed)
?   If causing headache.  BP elevated here.. Follow BP at home. May need med adjustment.

## 2016-10-12 NOTE — Progress Notes (Signed)
Subjective:    Patient ID: Brianna Barnes, female    DOB: October 15, 1928, 83 y.o.   MRN: [id number]  HPI    83 year old female  With HTN, BPPV, anemia, mild malnutritionpresents with her daughter with intermittent dizziness.  Noted spinning of room when lying down. HAs to hold onto something when she is walking.  Room spinning when looking down. She reports it has been going on now for 3 week.  She denies any med changes.  She does not drink much water.  no fever, no bleeding  no sneezing, no congestion.    Occ sharp pain in temples, last seconds.  Blood pressure (!) 150/70, pulse 79, temperature 98.4 F (36.9 C), temperature source Oral, height 4\' 10"  (1.473 m), weight 115 lb 8 oz (52.4 kg).   BP Readings from Last 3 Encounters:  10/12/16 (!) 150/70  05/21/16 111/69  01/30/16 (!) 149/80      Review of Systems  Constitutional: Negative for fatigue.  HENT: Negative for ear pain.   Eyes: Negative for pain.  Respiratory: Negative for cough and wheezing.   Cardiovascular: Negative for chest pain, palpitations and leg swelling.  Gastrointestinal: Negative for blood in stool, diarrhea and nausea.  Genitourinary: Negative for dysuria and hematuria.       Objective:   Physical Exam  Constitutional: She is oriented to person, place, and time. Vital signs are normal. She appears well-developed and well-nourished. She is cooperative.  Non-toxic appearance. She does not appear ill. No distress.  HENT:  Head: Normocephalic.  Right Ear: Hearing, tympanic membrane, external ear and ear canal normal. Tympanic membrane is not erythematous, not retracted and not bulging.  Left Ear: Hearing, tympanic membrane, external ear and ear canal normal. Tympanic membrane is not erythematous, not retracted and not bulging.  Nose: No mucosal edema or rhinorrhea. Right sinus exhibits no maxillary sinus tenderness and no frontal sinus tenderness. Left sinus exhibits no maxillary sinus tenderness and no  frontal sinus tenderness.  Mouth/Throat: Uvula is midline, oropharynx is clear and moist and mucous membranes are normal.  Eyes: Pupils are equal, round, and reactive to light. Conjunctivae, EOM and lids are normal. Lids are everted and swept, no foreign bodies found.  Neck: Trachea normal and normal range of motion. Neck supple. Carotid bruit is not present. No thyroid mass and no thyromegaly present.  Cardiovascular: Normal rate, regular rhythm, S1 normal, S2 normal, normal heart sounds, intact distal pulses and normal pulses.  Exam reveals no gallop and no friction rub.   No murmur heard. Pulmonary/Chest: Effort normal and breath sounds normal. No tachypnea. No respiratory distress. She has no decreased breath sounds. She has no wheezes. She has no rhonchi. She has no rales.  Abdominal: Soft. Normal appearance and bowel sounds are normal. There is no tenderness.  Neurological: She is alert and oriented to person, place, and time. She has normal strength and normal reflexes. No cranial nerve deficit or sensory deficit. She exhibits normal muscle tone. She displays a negative Romberg sign. Coordination and gait normal. GCS eye subscore is 4. GCS verbal subscore is 5. GCS motor subscore is 6.  Nml cerebellar exam   No papilledema  Skin: Skin is warm, dry and intact. No rash noted.  Psychiatric: She has a normal mood and affect. Her speech is normal and behavior is normal. Judgment and thought content normal. Her mood appears not anxious. Cognition and memory are normal. Cognition and memory are not impaired. She does not exhibit a depressed  mood. She exhibits normal recent memory and normal remote memory.          Assessment & Plan:

## 2016-10-12 NOTE — Patient Instructions (Addendum)
Get blood pressure cuff, follow BP at home.. Call with measurements in 1-2 weeks.  Can use meclizine OTC for severe vertgio but will make you sleepy so use mainly at night.  Start home densitization exercises.  If not improving in 3-4 weeks.. Call for a referral to PT balance retraining.

## 2016-10-28 ENCOUNTER — Telehealth: Payer: Self-pay

## 2016-10-28 ENCOUNTER — Other Ambulatory Visit: Payer: Self-pay

## 2016-10-28 DIAGNOSIS — K7469 Other cirrhosis of liver: Secondary | ICD-10-CM

## 2016-10-28 NOTE — Telephone Encounter (Signed)
-----   Message from Algernon Huxley, RN sent at 02/02/2016  2:21 PM EST ----- Regarding: [place] Pt needs [place] Of Liver in 61mth, reminder in epic.

## 2016-10-28 NOTE — Telephone Encounter (Signed)
Pt scheduled for [place] of abd at [facility] 11/11/16@9am , pt to arrive at 8:45am and be NPO after midnight. Pts daughter aware of appt.

## 2016-11-11 ENCOUNTER — Ambulatory Visit (HOSPITAL_COMMUNITY)
Admission: RE | Admit: 2016-11-11 | Discharge: 2016-11-11 | Disposition: A | Discharge status: Home / Self Care | Payer: Medicare Other | Source: Ambulatory Visit | Attending: Internal Medicine | Admitting: Internal Medicine

## 2016-11-11 DIAGNOSIS — K7469 Other cirrhosis of liver: Secondary | ICD-10-CM | POA: Diagnosis not present

## 2016-11-11 DIAGNOSIS — Z9049 Acquired absence of other specified parts of digestive tract: Secondary | ICD-10-CM | POA: Insufficient documentation

## 2016-11-11 DIAGNOSIS — K7460 Unspecified cirrhosis of liver: Secondary | ICD-10-CM | POA: Diagnosis not present

## 2016-11-16 DIAGNOSIS — H401132 Primary open-angle glaucoma, bilateral, moderate stage: Secondary | ICD-10-CM | POA: Diagnosis not present

## 2016-11-30 DIAGNOSIS — H401132 Primary open-angle glaucoma, bilateral, moderate stage: Secondary | ICD-10-CM | POA: Diagnosis not present

## 2016-11-30 DIAGNOSIS — H43813 Vitreous degeneration, bilateral: Secondary | ICD-10-CM | POA: Diagnosis not present

## 2017-01-04 ENCOUNTER — Other Ambulatory Visit: Payer: Self-pay | Admitting: Family Medicine

## 2017-01-04 DIAGNOSIS — Z1231 Encounter for screening mammogram for malignant neoplasm of breast: Secondary | ICD-10-CM

## 2017-01-11 ENCOUNTER — Other Ambulatory Visit: Payer: Self-pay | Admitting: Family Medicine

## 2017-01-28 DIAGNOSIS — N819 Female genital prolapse, unspecified: Secondary | ICD-10-CM | POA: Diagnosis not present

## 2017-01-28 DIAGNOSIS — R3915 Urgency of urination: Secondary | ICD-10-CM | POA: Diagnosis not present

## 2017-02-09 ENCOUNTER — Ambulatory Visit
Admission: RE | Admit: 2017-02-09 | Discharge: 2017-02-09 | Disposition: A | Discharge status: Home / Self Care | Payer: Medicare Other | Source: Ambulatory Visit | Attending: Family Medicine | Admitting: Family Medicine

## 2017-02-09 DIAGNOSIS — Z1231 Encounter for screening mammogram for malignant neoplasm of breast: Secondary | ICD-10-CM

## 2017-02-25 ENCOUNTER — Ambulatory Visit (INDEPENDENT_AMBULATORY_CARE_PROVIDER_SITE_OTHER): Payer: Medicare Other | Admitting: Family Medicine

## 2017-02-25 ENCOUNTER — Encounter: Payer: Self-pay | Admitting: Family Medicine

## 2017-02-25 VITALS — BP 130/72 | HR 80 | Temp 98.4°F | Ht <= 58 in | Wt 111.0 lb

## 2017-02-25 DIAGNOSIS — F5104 Psychophysiologic insomnia: Secondary | ICD-10-CM

## 2017-02-25 DIAGNOSIS — E559 Vitamin D deficiency, unspecified: Secondary | ICD-10-CM | POA: Diagnosis not present

## 2017-02-25 DIAGNOSIS — D509 Iron deficiency anemia, unspecified: Secondary | ICD-10-CM

## 2017-02-25 DIAGNOSIS — K7469 Other cirrhosis of liver: Secondary | ICD-10-CM | POA: Diagnosis not present

## 2017-02-25 DIAGNOSIS — I10 Essential (primary) hypertension: Secondary | ICD-10-CM | POA: Diagnosis not present

## 2017-02-25 DIAGNOSIS — M25571 Pain in right ankle and joints of right foot: Secondary | ICD-10-CM | POA: Diagnosis not present

## 2017-02-25 DIAGNOSIS — K219 Gastro-esophageal reflux disease without esophagitis: Secondary | ICD-10-CM

## 2017-02-25 DIAGNOSIS — M810 Age-related osteoporosis without current pathological fracture: Secondary | ICD-10-CM | POA: Diagnosis not present

## 2017-02-25 DIAGNOSIS — Z0000 Encounter for general adult medical examination without abnormal findings: Secondary | ICD-10-CM

## 2017-02-25 DIAGNOSIS — Z0001 Encounter for general adult medical examination with abnormal findings: Secondary | ICD-10-CM

## 2017-02-25 LAB — CBC WITH DIFFERENTIAL/PLATELET
Basophils Absolute: 0 10*3/uL (ref 0.0–0.1)
Basophils Relative: 0.6 % (ref 0.0–3.0)
EOS PCT: 3 % (ref 0.0–5.0)
Eosinophils Absolute: 0.1 10*3/uL (ref 0.0–0.7)
HEMATOCRIT: 37.9 % (ref 36.0–46.0)
Hemoglobin: 12.6 g/dL (ref 12.0–15.0)
LYMPHS ABS: 1 10*3/uL (ref 0.7–4.0)
LYMPHS PCT: 26.6 % (ref 12.0–46.0)
MCHC: 33.2 g/dL (ref 30.0–36.0)
MCV: 88.7 fl (ref 78.0–100.0)
MONOS PCT: 12.1 % — AB (ref 3.0–12.0)
Monocytes Absolute: 0.4 10*3/uL (ref 0.1–1.0)
NEUTROS PCT: 57.7 % (ref 43.0–77.0)
Neutro Abs: 2.1 10*3/uL (ref 1.4–7.7)
Platelets: 166 10*3/uL (ref 150.0–400.0)
RBC: 4.27 Mil/uL (ref 3.87–5.11)
RDW: 15.5 % (ref 11.5–15.5)
WBC: 3.6 10*3/uL — ABNORMAL LOW (ref 4.0–10.5)

## 2017-02-25 LAB — COMPREHENSIVE METABOLIC PANEL
ALK PHOS: 69 U/L (ref 39–117)
ALT: 17 U/L (ref 0–35)
AST: 31 U/L (ref 0–37)
Albumin: 3.9 g/dL (ref 3.5–5.2)
BUN: 9 mg/dL (ref 6–23)
CO2: 32 meq/L (ref 19–32)
Calcium: 9.1 mg/dL (ref 8.4–10.5)
Chloride: 102 mEq/L (ref 96–112)
Creatinine, Ser: 0.65 mg/dL (ref 0.40–1.20)
GFR: 91.82 mL/min (ref >60.00)
GLUCOSE: 97 mg/dL (ref 70–99)
POTASSIUM: 4.6 meq/L (ref 3.5–5.1)
Sodium: 138 mEq/L (ref 135–145)
TOTAL PROTEIN: 6.1 g/dL (ref 6.0–8.3)
Total Bilirubin: 0.8 mg/dL (ref 0.2–1.2)

## 2017-02-25 LAB — VITAMIN D 25 HYDROXY (VIT D DEFICIENCY, FRACTURES): VITD: 45.78 ng/mL (ref 30.00–100.00)

## 2017-02-25 MED ORDER — PANTOPRAZOLE SODIUM 40 MG PO TBEC: 40.0000 mg | DELAYED_RELEASE_TABLET | Freq: Every day | ORAL | 3 refills | Status: DC

## 2017-02-25 MED ORDER — TRAZODONE HCL 50 MG PO TABS: 100.0000 mg | ORAL_TABLET | Freq: Every evening | ORAL | 5 refills | Status: DC | PRN

## 2017-02-25 NOTE — Assessment & Plan Note (Signed)
Try higher dose of trazodone.

## 2017-02-25 NOTE — Assessment & Plan Note (Signed)
Prilosec inneffective.. Change to pantoprazole.

## 2017-02-25 NOTE — Assessment & Plan Note (Signed)
Tendonitis and possible foot sprain.. Treat with ice, elevation, start home PT.  If not improving in 2 weeks return for re-eval with X-ray for stress fracture given age and osteoporosis.

## 2017-02-25 NOTE — Assessment & Plan Note (Signed)
Due for re-eval. 

## 2017-02-25 NOTE — Progress Notes (Signed)
Subjective:    Patient ID: Brianna Barnes, female    DOB: August 17, 1928, 83 y.o.   MRN: [id number]  HPI The patient presents for annual medicare wellness, complete physical and review of chronic health problems. He/She also has the following acute concerns today: worsened reflux.. Occurring daily despite prilosec. HAs been on for years.  eating a lot of oranges.  I have personally reviewed the Medicare Annual Wellness questionnaire and have noted 1. The patient's medical and social history 2. Their use of alcohol, tobacco or illicit drugs 3. Their current medications and supplements 4. The patient's functional ability including ADL's, fall risks, home safety risks and hearing or visual             impairment. 5. Diet and physical activities 6. Evidence for depression or mood disorders 7.         Updated provider list Cognitive evaluation was performed and recorded on pt medicare questionnaire form. The patients weight, height, BMI and visual acuity have been recorded in the chart  I have made referrals, counseling and provided education to the patient based review of the above and I have provided the pt with a written personalized care plan for preventive services.   Documentation of this information was scanned into the electronic record under the media tab.  Hypertension:   Good control on no medication.  BP Readings from Last 3 Encounters:  02/25/17 130/72  10/12/16 (!) 150/70  05/21/16 111/69  Using medication without problems or lightheadedness: none  Chest pain with exertion:none Edema: none Short of breath: none Average home BPs: Other issues:  She has noted 3 weeks of pain in right ankle , posterior, medial.  Wear in ace bandage, elevating. Icing intermittent sharp pain. Helped a little. No fall, no new injury.  cryptogenic cirrhosis: stable LFTs and followed by GI Dr. Hilarie Fredrickson  chronic insomnia: She  1 tab is taking trazodone.. Does not help.Marland Kitchen Has not tried 2.  GERD:  see above  Using boost  Body mass index is 23.2 kg/m. Wt Readings from Last 3 Encounters:  02/25/17 111 lb (50.3 kg)  10/12/16 115 lb 8 oz (52.4 kg)  05/21/16 110 lb 8 oz (50.1 kg)     Advance directives and end of life planning reviewed in detail with patient and documented in EMR. Patient given handout on advance care directives if needed. HCPOA and living will updated if needed. Fall Risk  02/25/2017 01/30/2016 01/28/2015 01/25/2014 01/18/2013  Falls in the past year? No No No No No  Vision Screening Comments: Eye Exam 2018 with Dr. Wallace [facility] at [facility]   Depression screen [facility] 2/9 02/25/2017 01/30/2016 01/28/2015 01/25/2014 01/18/2013  Decreased Interest 0 0 0 0 0  Down, Depressed, Hopeless 0 0 0 0 0  PHQ - 2 Score 0 0 0 0 0    Social History /Family History/Past Medical History reviewed in detail and updated in EMR if needed. Blood pressure 130/72, pulse 80, temperature 98.4 F (36.9 C), temperature source Oral, height 4\' 10"  (1.473 m), weight 111 lb (50.3 kg), SpO2 98 %.   Review of Systems  Constitutional: Negative for fatigue and fever.  HENT: Negative for congestion.   Eyes: Negative for pain.  Respiratory: Negative for cough and shortness of breath.   Cardiovascular: Negative for chest pain, palpitations and leg swelling.  Gastrointestinal: Negative for abdominal pain.  Genitourinary: Negative for dysuria and vaginal bleeding.  Musculoskeletal: Negative for back pain.  Neurological: Negative for syncope, light-headedness and headaches.  Psychiatric/Behavioral: Negative for dysphoric mood.       Objective:   Physical Exam  Constitutional: Vital signs are normal. She appears well-developed. She appears cachectic. She is cooperative.  Non-toxic appearance. She does not appear ill. No distress.  Elderly thin female in NAD  HENT:  Head: Normocephalic.  Right Ear: Hearing, tympanic membrane, external ear and ear canal normal.  Left Ear: Hearing, tympanic  membrane, external ear and ear canal normal.  Nose: Nose normal.  Eyes: Conjunctivae, EOM and lids are normal. Pupils are equal, round, and reactive to light. Lids are everted and swept, no foreign bodies found.  Neck: Trachea normal and normal range of motion. Neck supple. Carotid bruit is not present. No thyroid mass and no thyromegaly present.  Cardiovascular: Normal rate, regular rhythm, S1 normal, S2 normal, normal heart sounds and intact distal pulses. Exam reveals no gallop.  No murmur heard. Pulmonary/Chest: Effort normal and breath sounds normal. No respiratory distress. She has no wheezes. She has no rhonchi. She has no rales.  Abdominal: Soft. Normal appearance and bowel sounds are normal. She exhibits no distension, no fluid wave, no abdominal bruit and no mass. There is no hepatosplenomegaly. There is no tenderness. There is no rebound, no guarding and no CVA tenderness. No hernia.  Musculoskeletal:       Right ankle: She exhibits normal range of motion and no swelling. Tenderness. Medial malleolus and posterior TFL tenderness found. No head of 5th metatarsal and no proximal fibula tenderness found. Achilles tendon normal.       Feet:  ttp over medial ankle and dorsal foot  Lymphadenopathy:    She has no cervical adenopathy.    She has no axillary adenopathy.  Neurological: She is alert. She has normal strength. No cranial nerve deficit or sensory deficit.  Skin: Skin is warm, dry and intact. No rash noted.  Psychiatric: Her speech is normal and behavior is normal. Judgment normal. Her mood appears not anxious. Cognition and memory are normal. She does not exhibit a depressed mood.          Assessment & Plan:  The patient's preventative maintenance and recommended screening tests for an annual wellness exam were reviewed in full today. Brought up to date unless services declined.  Counselled on the importance of diet, exercise, and its role in overall health and  mortality. The patient's FH and SH was reviewed, including their home life, tobacco status, and drug and alcohol status.   Vaccines: uptodate, except Tdap.. will look into. Pap/DVE: not indicated Mammo: scheduled Bone Density:2011, on fosamax for 10 years, will stopped in 2013. Remained stable on 2015 DEXA, worse on DEXa 2017.Marland Kitchen Restarted fosamax. Recheck in 2019. Colon: nml 2010 no repeat needed  Smoking Status: none

## 2017-02-25 NOTE — Patient Instructions (Addendum)
Please stop at the lab to have labs drawn. Stop prilosec and change to pantoprazole for reflux.  Decreased citris in diet.  It is okay to take 1.5 to 2 tablets of the trazodone.. Call if not helping. Tendonitis and possible foot sprain.. Treat with ice, elevation, start home PT. Wear firmer brace.  If not improving in 2 weeks return for re-eval with X-ray for stress fracture given age and osteoporosis.

## 2017-02-25 NOTE — Assessment & Plan Note (Signed)
Followed annually by GI.

## 2017-02-25 NOTE — Assessment & Plan Note (Signed)
Back on fosamax.. Recheck DEXA 2019.

## 2017-02-28 ENCOUNTER — Encounter: Payer: Self-pay | Admitting: Family Medicine

## 2017-03-22 ENCOUNTER — Encounter: Payer: Self-pay | Admitting: Family Medicine

## 2017-03-22 ENCOUNTER — Ambulatory Visit (INDEPENDENT_AMBULATORY_CARE_PROVIDER_SITE_OTHER)
Admission: RE | Admit: 2017-03-22 | Discharge: 2017-03-22 | Disposition: A | Discharge status: Home / Self Care | Payer: Medicare Other | Source: Ambulatory Visit | Attending: Family Medicine | Admitting: Family Medicine

## 2017-03-22 ENCOUNTER — Other Ambulatory Visit: Payer: Self-pay

## 2017-03-22 ENCOUNTER — Ambulatory Visit (INDEPENDENT_AMBULATORY_CARE_PROVIDER_SITE_OTHER): Payer: Medicare Other | Admitting: Family Medicine

## 2017-03-22 VITALS — BP 130/70 | HR 85 | Temp 98.2°F | Ht <= 58 in | Wt 111.8 lb

## 2017-03-22 DIAGNOSIS — M25571 Pain in right ankle and joints of right foot: Secondary | ICD-10-CM | POA: Diagnosis not present

## 2017-03-22 DIAGNOSIS — M810 Age-related osteoporosis without current pathological fracture: Secondary | ICD-10-CM

## 2017-03-22 DIAGNOSIS — M19071 Primary osteoarthritis, right ankle and foot: Secondary | ICD-10-CM | POA: Diagnosis not present

## 2017-03-22 DIAGNOSIS — M79671 Pain in right foot: Secondary | ICD-10-CM | POA: Insufficient documentation

## 2017-03-22 NOTE — Patient Instructions (Signed)
We will call you with final X-ray result.  Start home PT.  Wear air cast when up on feet.  Elevate. Ice.  If not improving in 2-4 weeks.. Call for referral to sports med or podiatry.

## 2017-03-22 NOTE — Assessment & Plan Note (Signed)
Improved today.. No clear fracture on X-ray.

## 2017-03-22 NOTE — Progress Notes (Signed)
   Subjective:    Patient ID: Brianna Barnes, female    DOB: [birth year], 84 y.o.   MRN: [id number]  HPI    84 year old female returns with continued right   ankle and dorsal foot pain ongoing x  1 month. She reports she may have twisted it when walking. Not improving, but not worsening.  Pain is off and on. Pain most with int rotation, and walking.   Able to weight bear.  Mainly in lateral ankle, but as medial ankle, less so now in dorsal foot. No redness, no swelling.   no recent fall.  She has been wearing ankle brace, iced, elevated.  Hx of osteoporosis.     Review of Systems  Constitutional: Negative for fatigue and fever.  HENT: Negative for ear pain.   Eyes: Negative for pain.  Respiratory: Negative for chest tightness and shortness of breath.   Cardiovascular: Negative for chest pain, palpitations and leg swelling.  Gastrointestinal: Negative for abdominal pain.  Genitourinary: Negative for dysuria.       Objective:   Physical Exam  Constitutional: Vital signs are normal. She appears well-developed and well-nourished. She is cooperative.  Non-toxic appearance. She does not appear ill. No distress.  HENT:  Head: Normocephalic.  Right Ear: Hearing, tympanic membrane, external ear and ear canal normal. Tympanic membrane is not erythematous, not retracted and not bulging.  Left Ear: Hearing, tympanic membrane, external ear and ear canal normal. Tympanic membrane is not erythematous, not retracted and not bulging.  Nose: No mucosal edema or rhinorrhea. Right sinus exhibits no maxillary sinus tenderness and no frontal sinus tenderness. Left sinus exhibits no maxillary sinus tenderness and no frontal sinus tenderness.  Mouth/Throat: Uvula is midline, oropharynx is clear and moist and mucous membranes are normal.  Eyes: Conjunctivae, EOM and lids are normal. Pupils are equal, round, and reactive to light. Lids are everted and swept, no foreign bodies found.  Neck: Trachea  normal and normal range of motion. Neck supple. Carotid bruit is not present. No thyroid mass and no thyromegaly present.  Cardiovascular: Normal rate, regular rhythm, S1 normal, S2 normal, normal heart sounds, intact distal pulses and normal pulses. Exam reveals no gallop and no friction rub.  No murmur heard. Pulmonary/Chest: Effort normal and breath sounds normal. No tachypnea. No respiratory distress. She has no decreased breath sounds. She has no wheezes. She has no rhonchi. She has no rales.  Abdominal: Soft. Normal appearance and bowel sounds are normal. There is no tenderness.  Musculoskeletal:       Right ankle: She exhibits normal range of motion. Tenderness. Lateral malleolus and medial malleolus tenderness found. No posterior TFL and no proximal fibula tenderness found. Achilles tendon exhibits no pain, no defect and normal Thompson's test results.  Neurological: She is alert.  Skin: Skin is warm, dry and intact. No rash noted.  Psychiatric: Her speech is normal and behavior is normal. Judgment and thought content normal. Her mood appears not anxious. Cognition and memory are normal. She does not exhibit a depressed mood.          Assessment & Plan:

## 2017-03-22 NOTE — Assessment & Plan Note (Signed)
High fracure risk...eval with X-ray... No clear fracture on X-ray.  Most likely ankle sprain.  Treat with air cast, ice, elevation, home pt.

## 2017-03-24 MED ORDER — TRAMADOL HCL 50 MG PO TABS: 25.0000 mg | ORAL_TABLET | Freq: Two times a day (BID) | ORAL | 0 refills | Status: DC | PRN

## 2017-03-24 NOTE — Telephone Encounter (Signed)
Tramadol called into [facility].

## 2017-03-24 NOTE — Telephone Encounter (Signed)
Call in  rx for tramadol sent in for ankle pain to use as needed.  I sent message to daughter via [place].

## 2017-04-07 DIAGNOSIS — L570 Actinic keratosis: Secondary | ICD-10-CM | POA: Diagnosis not present

## 2017-04-07 DIAGNOSIS — C44329 Squamous cell carcinoma of skin of other parts of face: Secondary | ICD-10-CM | POA: Diagnosis not present

## 2017-04-07 DIAGNOSIS — D485 Neoplasm of uncertain behavior of skin: Secondary | ICD-10-CM | POA: Diagnosis not present

## 2017-04-07 DIAGNOSIS — L820 Inflamed seborrheic keratosis: Secondary | ICD-10-CM | POA: Diagnosis not present

## 2017-04-07 DIAGNOSIS — C4492 Squamous cell carcinoma of skin, unspecified: Secondary | ICD-10-CM

## 2017-04-07 DIAGNOSIS — L821 Other seborrheic keratosis: Secondary | ICD-10-CM | POA: Diagnosis not present

## 2017-04-07 DIAGNOSIS — B078 Other viral warts: Secondary | ICD-10-CM | POA: Diagnosis not present

## 2017-04-07 HISTORY — DX: Squamous cell carcinoma of skin, unspecified: C44.92

## 2017-05-16 DIAGNOSIS — L578 Other skin changes due to chronic exposure to nonionizing radiation: Secondary | ICD-10-CM | POA: Diagnosis not present

## 2017-05-16 DIAGNOSIS — L814 Other melanin hyperpigmentation: Secondary | ICD-10-CM | POA: Diagnosis not present

## 2017-05-16 DIAGNOSIS — L988 Other specified disorders of the skin and subcutaneous tissue: Secondary | ICD-10-CM | POA: Diagnosis not present

## 2017-05-16 DIAGNOSIS — C44329 Squamous cell carcinoma of skin of other parts of face: Secondary | ICD-10-CM | POA: Diagnosis not present

## 2017-05-30 DIAGNOSIS — H401132 Primary open-angle glaucoma, bilateral, moderate stage: Secondary | ICD-10-CM | POA: Diagnosis not present

## 2017-06-12 ENCOUNTER — Encounter: Payer: Self-pay | Admitting: Emergency Medicine

## 2017-06-12 ENCOUNTER — Emergency Department
Admission: EM | Admit: 2017-06-12 | Discharge: 2017-06-12 | Disposition: A | Discharge status: Home / Self Care | Payer: Medicare Other | Attending: Emergency Medicine | Admitting: Emergency Medicine

## 2017-06-12 ENCOUNTER — Other Ambulatory Visit: Payer: Self-pay

## 2017-06-12 DIAGNOSIS — Z79899 Other long term (current) drug therapy: Secondary | ICD-10-CM | POA: Insufficient documentation

## 2017-06-12 DIAGNOSIS — I10 Essential (primary) hypertension: Secondary | ICD-10-CM | POA: Diagnosis not present

## 2017-06-12 DIAGNOSIS — M79671 Pain in right foot: Secondary | ICD-10-CM | POA: Insufficient documentation

## 2017-06-12 DIAGNOSIS — Z7982 Long term (current) use of aspirin: Secondary | ICD-10-CM | POA: Insufficient documentation

## 2017-06-12 MED ORDER — PREDNISONE 10 MG PO TABS: 10.0000 mg | ORAL_TABLET | Freq: Every day | ORAL | 0 refills | Status: DC

## 2017-06-12 NOTE — ED Triage Notes (Signed)
Pt arrived via POV from home with reports of right foot pain. Pt had foot pain in January and had an XR. Pt denies any injury.  Pt was in an [place] in Jan, but the pain improved several weeks later.    Pain to right foot started again yesterday after walking for an extended period of time.  Pt is back in air cast. Pain is on the top of the foot extending to the medial and lateral ankle.   Pt was given a tab of oxycodone from a family member last night which helped with the pain.   Pt currently has air cast on, pt is able to bear minimal weight on right foot and family states pt was using a crutch around the house. Pt lives alone.

## 2017-06-12 NOTE — ED Notes (Signed)
See triage note  States she developed pain to right foot yesterday  States pain started while sitting   Pain is mainly across top of foot and lateral  No swelling noted

## 2017-06-12 NOTE — Discharge Instructions (Addendum)
Please take medication as prescribed.  Use a walker to help with ambulation.  Wear splint and postop shoe until follow-up appointment with podiatrist.  Return to the ER for any increasing foot pain, burning, numbness, worsening symptoms or urgent changes in your health.

## 2017-06-12 NOTE — ED Provider Notes (Signed)
[facility] EMERGENCY DEPARTMENT Provider Note   CSN: [id number] Arrival date & time: 06/12/17  5956     History   Chief Complaint Chief Complaint  Patient presents with  . Foot Pain    HPI ANJANAE Barnes is a 84 y.o. female presents to the emergency department for evaluation of left foot pain.  She has a history of foot pain that began in January that resolved with a air splint.  She had x-rays of the foot and ankle in January that showed no evidence of acute bony normality, degenerative changes are present along the phalanges as well as the calcaneus.  Patient states yesterday she had sharp pain 7 out of 10 along the top of the foot and anterior ankle mortise with no trauma or injury.  Pain is increased with active dorsiflexion.  She denies any redness, swelling, numbness, tingling or radicular symptoms.  HPI  Past Medical History:  Diagnosis Date  . Choledocholithiasis   . Cirrhosis, cryptogenic (HCC)    FOLLOWED BY DR PYRTLE-- LOV  IN EPIC--  STABLE PER NOTE  . Cryptogenic cirrhosis ([place])   . Esophageal varix ([place])   . GERD (gastroesophageal reflux disease)   . History of colonoscopy with polypectomy    ADENOMATOUS AND HYPERPLASIA POLYPS  (2008  &  2010)  . History of hepatitis C   . History of positive PPD    + TB SKIN TEST WITHOUT TB  . Hyperlipidemia   . Hypertension, portal ([place])    SECONDARY TO CRYPTOGENIC CIRRHOSIS  . Internal hemorrhoids   . Iron deficiency anemia   . Loss of hearing    right ear  . Osteoarthritis    hands, knees, and low back  . Osteoporosis   . PUD (peptic ulcer disease)   . Tubular adenoma of colon   . Uterovaginal prolapse, incomplete   . Wears glasses   . Wears hearing aid    left ear only    Patient Active Problem List   Diagnosis Date Noted  . Right foot pain 03/22/2017  . Right ankle pain 02/25/2017  . Chronic insomnia 04/03/2016  . Mild malnutrition ([place]) 01/30/2016  . BPPV (benign paroxysmal  positional vertigo) 01/30/2016  . Counseling regarding end of life decision making 01/25/2014  . Choledocholithiasis 11/30/2012  . Cryptogenic cirrhosis ([place]) 11/22/2012  . Family history of early CAD 10/29/2010  . Vitamin D deficiency 06/24/2009  . ESSENTIAL HYPERTENSION, BENIGN 06/13/2009  . Hyperlipidemia 06/06/2009  . Iron deficiency anemia 09/17/2008  . GERD 09/17/2008  . PEPTIC ULCER DISEASE 09/17/2008  . UTEROVAGINAL PROLAPSE, INCOMPLETE 09/17/2008  . OSTEOARTHRITIS 09/17/2008  . Osteoporosis 09/17/2008  . ARTERIOVENOUS MALFORMATION 09/17/2008  . POSITIVE TB SKIN TEST, WITHOUT TUBERCULOSIS 09/17/2008  . COLONIC POLYPS, BENIGN, HX OF 09/17/2008    Past Surgical History:  Procedure Laterality Date  . APPENDECTOMY  1960'S  . CHOLECYSTECTOMY  08/25/2011   Procedure: LAPAROSCOPIC CHOLECYSTECTOMY WITH INTRAOPERATIVE CHOLANGIOGRAM;  Surgeon: Zenovia Jarred, MD;  Location: [facility];  Service: General;  Laterality: N/A;  . ENDOSCOPIC RETROGRADE CHOLANGIOPANCREATOGRAPHY (ERCP) WITH PROPOFOL N/A 11/30/2012   Procedure: ENDOSCOPIC RETROGRADE CHOLANGIOPANCREATOGRAPHY (ERCP) WITH PROPOFOL;  Surgeon: Milus Banister, MD;  Location: [facility];  Service: Endoscopy;  Laterality: N/A;  . EXTERNAL EAR SURGERY Left 1980  . HYSTEROSCOPY W/D&C N/A 07/15/2014   Procedure: DILATATION AND CURETTAGE /HYSTEROSCOPY;  Surgeon: Molli Posey, MD;  Location: [facility];  Service: Gynecology;  Laterality: N/A;  . KNEE ARTHROSCOPY W/ MENISCECTOMY Right  06-19-2002  . [place] SURGERY  1990  . REMOVAL OF URINARY SLING N/A 09/13/2013   Procedure: REMOVAL OF RETAINED PESSARY;  Surgeon: Jorja Loa, MD;  Location: [facility];  Service: Urology;  Laterality: N/A;     OB History   None      Home Medications    Prior to Admission medications   Medication Sig Start Date End Date Taking? Authorizing Provider  alendronate (FOSAMAX) 70 MG tablet TAKE ONE TABLET BY MOUTH  ONCE EVERY 7 DAYS. TAKE WITH A FULL GLASS OFWATER ONAN EMPTY STOMACH 01/11/17   Bedsole, [name] E, MD  aspirin EC 81 MG tablet Take 81 mg by mouth daily.    [provider]  calcium-vitamin D (OSCAL WITH D) 500-200 MG-UNIT per tablet Take 1 tablet by mouth daily.    [provider]  Cholecalciferol (VITAMIN D3) 400 UNITS CAPS Take 400 mg by mouth 2 (two) times daily.      [provider]  ibuprofen (ADVIL,MOTRIN) 200 MG tablet Take 400 mg by mouth daily as needed.    [provider]  Iron Combinations (IRON COMPLEX PO) Take 1 tablet by mouth daily.    [provider]  pantoprazole (PROTONIX) 40 MG tablet Take 1 tablet (40 mg total) by mouth daily. 02/25/17   Bedsole, [name] E, MD  predniSONE (DELTASONE) 10 MG tablet Take 1 tablet (10 mg total) by mouth daily. 6,5,4,3,2,1 six day taper 06/12/17   Duanne Guess, PA-C  timolol (TIMOPTIC) 0.5 % ophthalmic solution Place 1 drop into both eyes daily.  01/22/15   [provider]  traMADol (ULTRAM) 50 MG tablet Take 0.5-1 tablets (25-50 mg total) by mouth every 12 (twelve) hours as needed for moderate pain. 03/24/17   Bedsole, [name] E, MD  traZODone (DESYREL) 50 MG tablet Take 2 tablets (100 mg total) by mouth at bedtime as needed for sleep. 02/25/17   Bedsole, [name] E, MD  vitamin E 400 UNIT capsule Take 400 Units by mouth daily.    [provider]    Family History Family History  Problem Relation Age of Onset  . Heart disease Mother   . Heart disease Father 82  . Hyperlipidemia Father   . Diabetes Father   . Cirrhosis Sister   . Diabetes Brother   . Heart disease Brother     Social History Social History   Tobacco Use  . Smoking status: Never Smoker  . Smokeless tobacco: Never Used  Substance Use Topics  . Alcohol use: No  . Drug use: No     Allergies   Patient has no known allergies.   Review of Systems Review of Systems  Constitutional: Negative for fever.  Respiratory:  Negative for shortness of breath.   Cardiovascular: Negative for chest pain.  Gastrointestinal: Negative for abdominal pain.  Genitourinary: Negative for difficulty urinating, dysuria and urgency.  Musculoskeletal: Positive for arthralgias. Negative for back pain and myalgias.  Skin: Negative for rash and wound.  Neurological: Negative for dizziness and headaches.     Physical Exam Updated Vital Signs BP (!) 142/108 (BP Location: Left Arm)   Pulse 82   Temp 97.7 F (36.5 C) (Oral)   Resp 18   Ht 4\' 11"  (1.499 m)   Wt 49.9 kg (110 lb)   SpO2 98%   BMI 22.22 kg/m   Physical Exam  Constitutional: She is oriented to person, place, and time. She appears well-developed and well-nourished.  HENT:  Head: Normocephalic  and atraumatic.  Eyes: Conjunctivae are normal.  Neck: Normal range of motion.  Cardiovascular: Normal rate.  Pulmonary/Chest: Effort normal. No respiratory distress.  Musculoskeletal:  Examination of the right foot shows patient has 2+ dorsalis pedis pulses.  Sensation is intact distally.  She has mild tenderness palpation along the dorsum of the right foot and anterior ankle joint line.  She is nontender along the medial or lateral malleolus.  No calcaneal tenderness.  Negative syndesmotic squeeze test.  She is nontender throughout the metatarsals were phalanges.  She has painful active dorsiflexion with no pain with plantar flexion.  Neurological: She is alert and oriented to person, place, and time.  Skin: Skin is warm. No rash noted.  Psychiatric: She has a normal mood and affect. Her behavior is normal. Thought content normal.     ED Treatments / Results  Labs (all labs ordered are listed, but only abnormal results are displayed) Labs Reviewed - No data to display  EKG None  Radiology No results found.  Procedures .Splint Application Date/Time: 09/18/2374 10:52 AM Performed by: Duanne Guess, PA-C Authorized by: Duanne Guess, PA-C   Consent:      Consent obtained:  Verbal   Consent given by:  Patient   Risks discussed:  Numbness and pain   Alternatives discussed:  No treatment and observation Pre-procedure details:    Sensation:  Normal Procedure details:    Laterality:  Right   Location:  Foot   Foot:  R foot   Cast type:  Short leg and short leg walking   Supplies:  Elastic bandage, cotton padding and Ortho-Glass Post-procedure details:    Pain:  Improved   Sensation:  Normal   Patient tolerance of procedure:  Tolerated well, no immediate complications Comments:     Patient able to ambulate in the room with postop shoe, posterior short leg splint.  Patient denies any significant pain with walking in the splint.   (including critical care time)  Medications Ordered in ED Medications - No data to display   Initial Impression / Assessment and Plan / ED Course  I have reviewed the triage vital signs and the nursing notes.  Pertinent labs & imaging results that were available during my care of the patient were reviewed by me and considered in my medical decision making (see chart for details).     84 year old female with nontraumatic right foot discomfort with dorsiflexion.  No traumatic injury.  X-rays taken in January after nontraumatic foot pain and ankle pain showed mild arthritic changes with no evidence of acute bony normality.  Patient has relief with posterior splint and postop shoe.  Encouraged her to continue with postop shoe and splint until follow-up with podiatrist.  Encouraged her to start using a walker until she can follow-up podiatry.  She is placed on prednisone taper to help decrease inflammation.  She is educated on signs symptoms return to the ED for.  Final Clinical Impressions(s) / ED Diagnoses   Final diagnoses:  Foot pain, right    ED Discharge Orders        Ordered    predniSONE (DELTASONE) 10 MG tablet  Daily     06/12/17 1046       Duanne Guess, [place] 06/12/17 [place], [place], MD 06/12/17 1553

## 2017-06-13 ENCOUNTER — Encounter: Payer: Self-pay | Admitting: Podiatry

## 2017-06-13 ENCOUNTER — Ambulatory Visit (INDEPENDENT_AMBULATORY_CARE_PROVIDER_SITE_OTHER): Payer: Medicare Other

## 2017-06-13 ENCOUNTER — Other Ambulatory Visit: Payer: Self-pay

## 2017-06-13 ENCOUNTER — Ambulatory Visit: Payer: Medicare Other | Admitting: Podiatry

## 2017-06-13 DIAGNOSIS — M779 Enthesopathy, unspecified: Secondary | ICD-10-CM

## 2017-06-13 DIAGNOSIS — M7751 Other enthesopathy of right foot: Secondary | ICD-10-CM

## 2017-06-13 DIAGNOSIS — M778 Other enthesopathies, not elsewhere classified: Secondary | ICD-10-CM

## 2017-06-13 NOTE — Progress Notes (Signed)
Subjective:  Patient ID: Brianna Barnes, female    DOB: May 17, 1928,  MRN: [id number] HPI Chief Complaint  Patient presents with  . Foot Pain    Patient presents today for rt ankle/foot pain x 2 days.  She stated this pain started back in January, but went away and then came back.  She states its a very sharp shooting pain in top of foot and medial side ankle, radiates up leg.  She was started on Prednisone today by [facility] ED and took a percocet which gave relief.    84 y.o. female presents with the above complaint.   ROS: Denies fever chills nausea vomiting muscle aches pains calf pain chest pain shortness of breath headaches.  Past Medical History:  Diagnosis Date  . Choledocholithiasis   . Cirrhosis, cryptogenic (HCC)    FOLLOWED BY DR PYRTLE-- LOV  IN EPIC--  STABLE PER NOTE  . Cryptogenic cirrhosis ([place])   . Esophageal varix ([place])   . GERD (gastroesophageal reflux disease)   . History of colonoscopy with polypectomy    ADENOMATOUS AND HYPERPLASIA POLYPS  (2008  &  2010)  . History of hepatitis C   . History of positive PPD    + TB SKIN TEST WITHOUT TB  . Hyperlipidemia   . Hypertension, portal ([place])    SECONDARY TO CRYPTOGENIC CIRRHOSIS  . Internal hemorrhoids   . Iron deficiency anemia   . Loss of hearing    right ear  . Osteoarthritis    hands, knees, and low back  . Osteoporosis   . PUD (peptic ulcer disease)   . Tubular adenoma of colon   . Uterovaginal prolapse, incomplete   . Wears glasses   . Wears hearing aid    left ear only   Past Surgical History:  Procedure Laterality Date  . APPENDECTOMY  1960'S  . CHOLECYSTECTOMY  08/25/2011   Procedure: LAPAROSCOPIC CHOLECYSTECTOMY WITH INTRAOPERATIVE CHOLANGIOGRAM;  Surgeon: Zenovia Jarred, MD;  Location: [facility];  Service: General;  Laterality: N/A;  . ENDOSCOPIC RETROGRADE CHOLANGIOPANCREATOGRAPHY (ERCP) WITH PROPOFOL N/A 11/30/2012   Procedure: ENDOSCOPIC RETROGRADE CHOLANGIOPANCREATOGRAPHY (ERCP) WITH PROPOFOL;   Surgeon: Milus Banister, MD;  Location: [facility];  Service: Endoscopy;  Laterality: N/A;  . EXTERNAL EAR SURGERY Left 1980  . HYSTEROSCOPY W/D&C N/A 07/15/2014   Procedure: DILATATION AND CURETTAGE /HYSTEROSCOPY;  Surgeon: Molli Posey, MD;  Location: [facility];  Service: Gynecology;  Laterality: N/A;  . KNEE ARTHROSCOPY W/ MENISCECTOMY Right 06-19-2002  . [place] SURGERY  1990  . REMOVAL OF URINARY SLING N/A 09/13/2013   Procedure: REMOVAL OF RETAINED PESSARY;  Surgeon: Jorja Loa, MD;  Location: [facility];  Service: Urology;  Laterality: N/A;    Current Outpatient Medications:  .  alendronate (FOSAMAX) 70 MG tablet, TAKE ONE TABLET BY MOUTH ONCE EVERY 7 DAYS. TAKE WITH A FULL GLASS OFWATER ONAN EMPTY STOMACH, Disp: 4 tablet, Rfl: 11 .  aspirin EC 81 MG tablet, Take 81 mg by mouth., Disp: , Rfl:  .  calcium-vitamin D (OSCAL WITH D) 500-200 MG-UNIT per tablet, Take 1 tablet by mouth daily., Disp: , Rfl:  .  Cholecalciferol (VITAMIN D3) 400 UNITS CAPS, Take 400 mg by mouth 2 (two) times daily.  , Disp: , Rfl:  .  ibuprofen (ADVIL,MOTRIN) 200 MG tablet, Take 400 mg by mouth daily as needed., Disp: , Rfl:  .  Iron Combinations (IRON COMPLEX PO), Take 1 tablet by mouth daily., Disp: , Rfl:  .  pantoprazole (PROTONIX) 40 MG tablet, Take 1 tablet (40 mg total) by mouth daily., Disp: 30 tablet, Rfl: 3 .  predniSONE (DELTASONE) 10 MG tablet, Take 1 tablet (10 mg total) by mouth daily. 6,5,4,3,2,1 six day taper, Disp: 21 tablet, Rfl: 0 .  predniSONE (STERAPRED UNI-PAK 21 TAB) 10 MG (21) TBPK tablet, , Disp: , Rfl:  .  timolol (TIMOPTIC) 0.5 % ophthalmic solution, Place 1 drop into both eyes daily. , Disp: , Rfl:  .  traMADol (ULTRAM) 50 MG tablet, Take 0.5-1 tablets (25-50 mg total) by mouth every 12 (twelve) hours as needed for moderate pain., Disp: 20 tablet, Rfl: 0 .  traZODone (DESYREL) 50 MG tablet, Take 2 tablets (100 mg total) by mouth at bedtime  as needed for sleep., Disp: 60 tablet, Rfl: 5 .  vitamin E 400 UNIT capsule, Take 400 Units by mouth daily., Disp: , Rfl:   No Known Allergies Review of Systems Objective:  There were no vitals filed for this visit.  General: Well developed, nourished, in no acute distress, alert and oriented x3   Dermatological: Skin is warm, dry and supple bilateral. Nails x 10 are well maintained; remaining integument appears unremarkable at this time. There are no open sores, no preulcerative lesions, no rash or signs of infection present.  Vascular: Dorsalis Pedis artery and Posterior Tibial artery pedal pulses are 2/4 bilateral with immedate capillary fill time. Pedal hair growth present. No varicosities and no lower extremity edema present bilateral.   Neruologic: Grossly intact via light touch bilateral. Vibratory intact via tuning fork bilateral. Protective threshold with Semmes Wienstein monofilament intact to all pedal sites bilateral. Patellar and Achilles deep tendon reflexes 2+ bilateral. No Babinski or clonus noted bilateral.   Musculoskeletal: No gross boney pedal deformities bilateral. No pain, crepitus, or limitation noted with foot and ankle range of motion bilateral. Muscular strength 5/5 in all groups tested bilateral.  Gait: Unassisted, Nonantalgic.    Radiographs:  No acute findings.  No fractures are identified.  Some early osteoarthritic changes of the midfoot.  Assessment & Plan:   Assessment: Cannot rule out gouty capsulitis because of the nature of the history of the pain.  The slow resolution quick onset.  Plan: After sterile Betadine skin prep I injected 20 mg Kenalog 5 mg Marcaine to the anterior ankle from lateral to medial.  Should she call the office noting that is similar symptoms we need to get her a requisition for an arthritic profile to be taken this day.  She was then follow-up with me within the next day or 2.     Max T. [place], [place]

## 2017-07-04 ENCOUNTER — Other Ambulatory Visit: Payer: Self-pay | Admitting: Family Medicine

## 2017-07-05 ENCOUNTER — Other Ambulatory Visit: Payer: Self-pay | Admitting: Family Medicine

## 2017-07-05 NOTE — Telephone Encounter (Signed)
Last office visit 03/22/2017.  Last refilled 03/24/2017 for #20 with no refills.  Ok to refill?

## 2017-07-29 DIAGNOSIS — N819 Female genital prolapse, unspecified: Secondary | ICD-10-CM | POA: Diagnosis not present

## 2017-07-29 DIAGNOSIS — R3915 Urgency of urination: Secondary | ICD-10-CM | POA: Diagnosis not present

## 2017-08-08 ENCOUNTER — Other Ambulatory Visit: Payer: Self-pay | Admitting: Family Medicine

## 2017-08-09 NOTE — Telephone Encounter (Signed)
Last office visit 03/22/2017.  Last refilled 07/05/2017 for #20 with no refills.  Ok to refill?

## 2017-09-22 DIAGNOSIS — L570 Actinic keratosis: Secondary | ICD-10-CM | POA: Diagnosis not present

## 2017-09-22 DIAGNOSIS — L821 Other seborrheic keratosis: Secondary | ICD-10-CM | POA: Diagnosis not present

## 2017-09-22 DIAGNOSIS — D225 Melanocytic nevi of trunk: Secondary | ICD-10-CM | POA: Diagnosis not present

## 2017-09-22 DIAGNOSIS — L905 Scar conditions and fibrosis of skin: Secondary | ICD-10-CM | POA: Diagnosis not present

## 2017-09-22 DIAGNOSIS — Z85828 Personal history of other malignant neoplasm of skin: Secondary | ICD-10-CM | POA: Diagnosis not present

## 2017-11-04 ENCOUNTER — Other Ambulatory Visit: Payer: Self-pay | Admitting: Family Medicine

## 2017-11-04 ENCOUNTER — Encounter: Payer: Self-pay | Admitting: Family Medicine

## 2017-11-04 ENCOUNTER — Ambulatory Visit (INDEPENDENT_AMBULATORY_CARE_PROVIDER_SITE_OTHER): Payer: Medicare Other | Admitting: Family Medicine

## 2017-11-04 ENCOUNTER — Ambulatory Visit (INDEPENDENT_AMBULATORY_CARE_PROVIDER_SITE_OTHER)
Admission: RE | Admit: 2017-11-04 | Discharge: 2017-11-04 | Disposition: A | Discharge status: Home / Self Care | Payer: Medicare Other | Source: Ambulatory Visit | Attending: Family Medicine | Admitting: Family Medicine

## 2017-11-04 ENCOUNTER — Other Ambulatory Visit: Payer: Self-pay

## 2017-11-04 VITALS — BP 110/70 | HR 84 | Temp 98.4°F | Ht <= 58 in | Wt 108.5 lb

## 2017-11-04 DIAGNOSIS — M542 Cervicalgia: Secondary | ICD-10-CM

## 2017-11-04 DIAGNOSIS — F418 Other specified anxiety disorders: Secondary | ICD-10-CM

## 2017-11-04 DIAGNOSIS — R8271 Bacteriuria: Secondary | ICD-10-CM

## 2017-11-04 DIAGNOSIS — R82998 Other abnormal findings in urine: Secondary | ICD-10-CM

## 2017-11-04 DIAGNOSIS — R0789 Other chest pain: Secondary | ICD-10-CM | POA: Diagnosis not present

## 2017-11-04 DIAGNOSIS — R5383 Other fatigue: Secondary | ICD-10-CM | POA: Diagnosis not present

## 2017-11-04 DIAGNOSIS — M810 Age-related osteoporosis without current pathological fracture: Secondary | ICD-10-CM | POA: Diagnosis not present

## 2017-11-04 LAB — CBC WITH DIFFERENTIAL/PLATELET
BASOS ABS: 0 10*3/uL (ref 0.0–0.1)
Basophils Relative: 0.4 % (ref 0.0–3.0)
EOS PCT: 1.5 % (ref 0.0–5.0)
Eosinophils Absolute: 0.1 10*3/uL (ref 0.0–0.7)
HEMATOCRIT: 35.7 % — AB (ref 36.0–46.0)
Hemoglobin: 11.8 g/dL — ABNORMAL LOW (ref 12.0–15.0)
LYMPHS ABS: 0.7 10*3/uL (ref 0.7–4.0)
LYMPHS PCT: 17 % (ref 12.0–46.0)
MCHC: 33 g/dL (ref 30.0–36.0)
MCV: 85.4 fl (ref 78.0–100.0)
MONOS PCT: 11.9 % (ref 3.0–12.0)
Monocytes Absolute: 0.5 10*3/uL (ref 0.1–1.0)
NEUTROS ABS: 3 10*3/uL (ref 1.4–7.7)
NEUTROS PCT: 69.2 % (ref 43.0–77.0)
PLATELETS: 173 10*3/uL (ref 150.0–400.0)
RBC: 4.18 Mil/uL (ref 3.87–5.11)
RDW: 15.3 % (ref 11.5–15.5)
WBC: 4.3 10*3/uL (ref 4.0–10.5)

## 2017-11-04 LAB — POC URINALSYSI DIPSTICK (AUTOMATED)
BILIRUBIN UA: NEGATIVE
Glucose, UA: NEGATIVE
Nitrite, UA: NEGATIVE
PROTEIN UA: POSITIVE — AB
RBC UA: NEGATIVE
Spec Grav, UA: 1.02 (ref 1.010–1.025)
Urobilinogen, UA: 0.2 E.U./dL
pH, UA: 6 (ref 5.0–8.0)

## 2017-11-04 LAB — VITAMIN D 25 HYDROXY (VIT D DEFICIENCY, FRACTURES): VITD: 31.3 ng/mL (ref 30.00–100.00)

## 2017-11-04 LAB — COMPREHENSIVE METABOLIC PANEL
ALT: 19 U/L (ref 0–35)
AST: 32 U/L (ref 0–37)
Albumin: 3.7 g/dL (ref 3.5–5.2)
Alkaline Phosphatase: 72 U/L (ref 39–117)
BUN: 9 mg/dL (ref 6–23)
CALCIUM: 8.9 mg/dL (ref 8.4–10.5)
CHLORIDE: 103 meq/L (ref 96–112)
CO2: 31 meq/L (ref 19–32)
Creatinine, Ser: 0.65 mg/dL (ref 0.40–1.20)
GFR: 91.67 mL/min (ref >60.00)
GLUCOSE: 131 mg/dL — AB (ref 70–99)
POTASSIUM: 4.1 meq/L (ref 3.5–5.1)
Sodium: 139 mEq/L (ref 135–145)
Total Bilirubin: 0.6 mg/dL (ref 0.2–1.2)
Total Protein: 5.8 g/dL — ABNORMAL LOW (ref 6.0–8.3)

## 2017-11-04 LAB — T4, FREE: Free T4: 0.76 ng/dL (ref 0.60–1.60)

## 2017-11-04 LAB — VITAMIN B12: Vitamin B-12: 284 pg/mL (ref 211–911)

## 2017-11-04 LAB — T3, FREE: T3 FREE: 3 pg/mL (ref 2.3–4.2)

## 2017-11-04 LAB — TSH: TSH: 1.14 u[IU]/mL (ref 0.35–4.50)

## 2017-11-04 MED ORDER — LIDOCAINE 5 % EX PTCH: 1.0000 | MEDICATED_PATCH | CUTANEOUS | 0 refills | Status: DC

## 2017-11-04 MED ORDER — TRAMADOL HCL 50 MG PO TABS: ORAL_TABLET | ORAL | 0 refills | Status: DC

## 2017-11-04 NOTE — Assessment & Plan Note (Signed)
No clear anxiety disorder per GAD7.. Mainly just worried about granddaughter.

## 2017-11-04 NOTE — Assessment & Plan Note (Signed)
Given osteoporosis.. Concern for compression fracture as there is vertebral ttp. Will eval with  X-ray  Most likley also has OA cervical spine.

## 2017-11-04 NOTE — Assessment & Plan Note (Signed)
No clear cardiac cause per EKG.  Eval with labs.  May be secondary to medication SE.. Trazodone.

## 2017-11-04 NOTE — Assessment & Plan Note (Signed)
No clear symptoms of UTI and micro shows few skin cells. Will treat if more specific UTI symptoms begin.

## 2017-11-04 NOTE — Progress Notes (Signed)
Subjective:    Patient ID: Brianna Barnes, female    DOB: 10-05-1928, 84 y.o.   MRN: [id number]  HPI   84 year old female pt with history of  BPPV, HTN, anemia, malnutrition, vit D def presents with new onset multiple issues.  1.headache, neck pain... This is bother her the most. Has been going on for 2 months. No known fall, or injury.  Worse with activity but present constantly now. 5/10 on pain scale.  No radiation of pain to arms, grip is decreased.  Has tried heat and topical OTC med. No numbness in hands. No past neck surgeries.  2.fatigue x months, gradually worse  Sleeps at night when taking trazodone.. Try to take 1 tabs  Did have moment of chest pain in last several weeks 3. Anxiety.. Gets worried she is bothering her family with caring for her.  GAD5   4. Dizziness.Marland Kitchen Has had in past but more frequent lately.  5. Abnormal color to urine: noted in last few months, no dysuria, no change in frequency  6. Yellowish stool:  nml consistence.Marland Kitchen occ constipation. No change in diet. X few months. Minimal water.   Social History /Family History/Past Medical History reviewed in detail and updated in EMR if needed. Blood pressure 110/70, pulse 84, temperature 98.4 F (36.9 C), temperature source Oral, height 4\' 10"  (1.473 m), weight 108 lb 8 oz (49.2 kg), SpO2 97 %.     Review of Systems  Constitutional: Positive for fatigue. Negative for fever.  HENT: Negative for congestion.   Eyes: Negative for pain.  Respiratory: Negative for cough and shortness of breath.   Cardiovascular: Negative for chest pain, palpitations and leg swelling.  Gastrointestinal: Negative for abdominal pain.  Genitourinary: Negative for dysuria and vaginal bleeding.  Musculoskeletal: Negative for back pain.  Neurological: Positive for headaches. Negative for syncope and light-headedness.  Psychiatric/Behavioral: Negative for dysphoric mood.       Objective:   Physical Exam  Constitutional:  Vital signs are normal. She appears well-developed and well-nourished. She is cooperative.  Non-toxic appearance. She does not appear ill. No distress.  HENT:  Head: Normocephalic.  Right Ear: Hearing, tympanic membrane, external ear and ear canal normal. Tympanic membrane is not erythematous, not retracted and not bulging.  Left Ear: Hearing, tympanic membrane, external ear and ear canal normal. Tympanic membrane is not erythematous, not retracted and not bulging.  Nose: No mucosal edema or rhinorrhea. Right sinus exhibits no maxillary sinus tenderness and no frontal sinus tenderness. Left sinus exhibits no maxillary sinus tenderness and no frontal sinus tenderness.  Mouth/Throat: Uvula is midline, oropharynx is clear and moist and mucous membranes are normal.  Eyes: Pupils are equal, round, and reactive to light. Conjunctivae, EOM and lids are normal. Lids are everted and swept, no foreign bodies found.  Neck: Trachea normal and normal range of motion. Neck supple. Spinous process tenderness and muscular tenderness present. Carotid bruit is not present. No neck rigidity. No erythema and normal range of motion present. No thyroid mass and no thyromegaly present.  Cardiovascular: Normal rate, regular rhythm, S1 normal, S2 normal, normal heart sounds, intact distal pulses and normal pulses. Exam reveals no gallop and no friction rub.  No murmur heard. Pulmonary/Chest: Effort normal and breath sounds normal. No tachypnea. No respiratory distress. She has no decreased breath sounds. She has no wheezes. She has no rhonchi. She has no rales.  Abdominal: Soft. Normal appearance and bowel sounds are normal. There is no tenderness.  Neurological:  She is alert.  Skin: Skin is warm, dry and intact. No rash noted.  Psychiatric: Her speech is normal and behavior is normal. Judgment and thought content normal. Her mood appears not anxious. Cognition and memory are normal. She does not exhibit a depressed mood.    Tearful when talking about granddaughter          Assessment & Plan:

## 2017-11-04 NOTE — Telephone Encounter (Signed)
Last office visit 11/04/2017.  Last refilled 08/09/2017 for #20 with no refills.  Ok to refill?

## 2017-11-04 NOTE — Patient Instructions (Addendum)
No clear urinary bacterial infection.  Increase water intake, call if any new burning with urination, fever or lower abdominal pain. We will call with X-ray results.  Please stop at the lab to have labs drawn.

## 2017-11-07 ENCOUNTER — Telehealth: Payer: Self-pay | Admitting: *Deleted

## 2017-11-07 NOTE — Telephone Encounter (Signed)
Received fax from [facility] requesting PA for Lidoderm Patch 5%.  PA completed on CoverMyMeds.  Sent for review.   Can take up to 72 hours for a decision.

## 2017-11-08 NOTE — Telephone Encounter (Signed)
PA denied.  [facility] notified of denial via fax.  Advised pharmacy that patient could pay out of pocket or buy OTC Lidocaine 4% patches.

## 2017-11-17 ENCOUNTER — Other Ambulatory Visit: Payer: Self-pay | Admitting: Family Medicine

## 2017-11-28 DIAGNOSIS — H9202 Otalgia, left ear: Secondary | ICD-10-CM | POA: Diagnosis not present

## 2017-12-05 DIAGNOSIS — H401132 Primary open-angle glaucoma, bilateral, moderate stage: Secondary | ICD-10-CM | POA: Diagnosis not present

## 2017-12-12 DIAGNOSIS — H401132 Primary open-angle glaucoma, bilateral, moderate stage: Secondary | ICD-10-CM | POA: Diagnosis not present

## 2018-01-15 ENCOUNTER — Other Ambulatory Visit: Payer: Self-pay | Admitting: Family Medicine

## 2018-01-16 ENCOUNTER — Other Ambulatory Visit: Payer: Self-pay | Admitting: Family Medicine

## 2018-01-16 DIAGNOSIS — Z1231 Encounter for screening mammogram for malignant neoplasm of breast: Secondary | ICD-10-CM

## 2018-01-21 ENCOUNTER — Inpatient Hospital Stay (HOSPITAL_COMMUNITY)
Admission: EM | Admit: 2018-01-21 | Discharge: 2018-01-25 | DRG: 481 | Disposition: A | Discharge status: Skilled Nursing Facility | Payer: Medicare Other | Attending: Internal Medicine | Admitting: Internal Medicine

## 2018-01-21 ENCOUNTER — Encounter (HOSPITAL_COMMUNITY): Payer: Self-pay | Admitting: Emergency Medicine

## 2018-01-21 ENCOUNTER — Emergency Department (HOSPITAL_COMMUNITY): Payer: Medicare Other

## 2018-01-21 DIAGNOSIS — R42 Dizziness and giddiness: Secondary | ICD-10-CM | POA: Diagnosis present

## 2018-01-21 DIAGNOSIS — I6523 Occlusion and stenosis of bilateral carotid arteries: Secondary | ICD-10-CM | POA: Diagnosis present

## 2018-01-21 DIAGNOSIS — E782 Mixed hyperlipidemia: Secondary | ICD-10-CM

## 2018-01-21 DIAGNOSIS — H409 Unspecified glaucoma: Secondary | ICD-10-CM | POA: Diagnosis not present

## 2018-01-21 DIAGNOSIS — Z79899 Other long term (current) drug therapy: Secondary | ICD-10-CM

## 2018-01-21 DIAGNOSIS — Z886 Allergy status to analgesic agent status: Secondary | ICD-10-CM

## 2018-01-21 DIAGNOSIS — D509 Iron deficiency anemia, unspecified: Secondary | ICD-10-CM | POA: Diagnosis present

## 2018-01-21 DIAGNOSIS — D62 Acute posthemorrhagic anemia: Secondary | ICD-10-CM | POA: Diagnosis not present

## 2018-01-21 DIAGNOSIS — S299XXA Unspecified injury of thorax, initial encounter: Secondary | ICD-10-CM | POA: Diagnosis not present

## 2018-01-21 DIAGNOSIS — K219 Gastro-esophageal reflux disease without esophagitis: Secondary | ICD-10-CM | POA: Diagnosis present

## 2018-01-21 DIAGNOSIS — S72142A Displaced intertrochanteric fracture of left femur, initial encounter for closed fracture: Secondary | ICD-10-CM | POA: Diagnosis present

## 2018-01-21 DIAGNOSIS — M19042 Primary osteoarthritis, left hand: Secondary | ICD-10-CM | POA: Diagnosis present

## 2018-01-21 DIAGNOSIS — W19XXXA Unspecified fall, initial encounter: Secondary | ICD-10-CM | POA: Diagnosis not present

## 2018-01-21 DIAGNOSIS — M25552 Pain in left hip: Secondary | ICD-10-CM | POA: Diagnosis not present

## 2018-01-21 DIAGNOSIS — R2689 Other abnormalities of gait and mobility: Secondary | ICD-10-CM | POA: Diagnosis not present

## 2018-01-21 DIAGNOSIS — F5104 Psychophysiologic insomnia: Secondary | ICD-10-CM | POA: Diagnosis present

## 2018-01-21 DIAGNOSIS — F419 Anxiety disorder, unspecified: Secondary | ICD-10-CM | POA: Diagnosis present

## 2018-01-21 DIAGNOSIS — Z86010 Personal history of colonic polyps: Secondary | ICD-10-CM

## 2018-01-21 DIAGNOSIS — Z419 Encounter for procedure for purposes other than remedying health state, unspecified: Secondary | ICD-10-CM

## 2018-01-21 DIAGNOSIS — Z8249 Family history of ischemic heart disease and other diseases of the circulatory system: Secondary | ICD-10-CM

## 2018-01-21 DIAGNOSIS — M19041 Primary osteoarthritis, right hand: Secondary | ICD-10-CM | POA: Diagnosis present

## 2018-01-21 DIAGNOSIS — E785 Hyperlipidemia, unspecified: Secondary | ICD-10-CM | POA: Diagnosis present

## 2018-01-21 DIAGNOSIS — Z833 Family history of diabetes mellitus: Secondary | ICD-10-CM | POA: Diagnosis not present

## 2018-01-21 DIAGNOSIS — I10 Essential (primary) hypertension: Secondary | ICD-10-CM | POA: Diagnosis present

## 2018-01-21 DIAGNOSIS — M6281 Muscle weakness (generalized): Secondary | ICD-10-CM | POA: Diagnosis not present

## 2018-01-21 DIAGNOSIS — Z8619 Personal history of other infectious and parasitic diseases: Secondary | ICD-10-CM

## 2018-01-21 DIAGNOSIS — K7469 Other cirrhosis of liver: Secondary | ICD-10-CM | POA: Diagnosis present

## 2018-01-21 DIAGNOSIS — Z8711 Personal history of peptic ulcer disease: Secondary | ICD-10-CM

## 2018-01-21 DIAGNOSIS — Z7983 Long term (current) use of bisphosphonates: Secondary | ICD-10-CM

## 2018-01-21 DIAGNOSIS — Z8349 Family history of other endocrine, nutritional and metabolic diseases: Secondary | ICD-10-CM | POA: Diagnosis not present

## 2018-01-21 DIAGNOSIS — M170 Bilateral primary osteoarthritis of knee: Secondary | ICD-10-CM | POA: Diagnosis present

## 2018-01-21 DIAGNOSIS — Z791 Long term (current) use of non-steroidal anti-inflammatories (NSAID): Secondary | ICD-10-CM

## 2018-01-21 DIAGNOSIS — Z9049 Acquired absence of other specified parts of digestive tract: Secondary | ICD-10-CM | POA: Diagnosis not present

## 2018-01-21 DIAGNOSIS — Z66 Do not resuscitate: Secondary | ICD-10-CM | POA: Diagnosis present

## 2018-01-21 DIAGNOSIS — E559 Vitamin D deficiency, unspecified: Secondary | ICD-10-CM | POA: Diagnosis present

## 2018-01-21 DIAGNOSIS — Z973 Presence of spectacles and contact lenses: Secondary | ICD-10-CM

## 2018-01-21 DIAGNOSIS — Z743 Need for continuous supervision: Secondary | ICD-10-CM | POA: Diagnosis not present

## 2018-01-21 DIAGNOSIS — Z974 Presence of external hearing-aid: Secondary | ICD-10-CM

## 2018-01-21 DIAGNOSIS — H9191 Unspecified hearing loss, right ear: Secondary | ICD-10-CM | POA: Diagnosis present

## 2018-01-21 DIAGNOSIS — Z7401 Bed confinement status: Secondary | ICD-10-CM | POA: Diagnosis not present

## 2018-01-21 DIAGNOSIS — E560 Deficiency of vitamin E: Secondary | ICD-10-CM | POA: Diagnosis not present

## 2018-01-21 DIAGNOSIS — Z4789 Encounter for other orthopedic aftercare: Secondary | ICD-10-CM | POA: Diagnosis not present

## 2018-01-21 DIAGNOSIS — Y92009 Unspecified place in unspecified non-institutional (private) residence as the place of occurrence of the external cause: Secondary | ICD-10-CM | POA: Diagnosis not present

## 2018-01-21 DIAGNOSIS — M810 Age-related osteoporosis without current pathological fracture: Secondary | ICD-10-CM | POA: Diagnosis present

## 2018-01-21 DIAGNOSIS — S72012D Unspecified intracapsular fracture of left femur, subsequent encounter for closed fracture with routine healing: Secondary | ICD-10-CM | POA: Diagnosis not present

## 2018-01-21 DIAGNOSIS — Z79891 Long term (current) use of opiate analgesic: Secondary | ICD-10-CM

## 2018-01-21 DIAGNOSIS — M2550 Pain in unspecified joint: Secondary | ICD-10-CM | POA: Diagnosis not present

## 2018-01-21 DIAGNOSIS — W010XXA Fall on same level from slipping, tripping and stumbling without subsequent striking against object, initial encounter: Secondary | ICD-10-CM | POA: Diagnosis present

## 2018-01-21 DIAGNOSIS — G479 Sleep disorder, unspecified: Secondary | ICD-10-CM | POA: Diagnosis not present

## 2018-01-21 DIAGNOSIS — R52 Pain, unspecified: Secondary | ICD-10-CM | POA: Diagnosis not present

## 2018-01-21 DIAGNOSIS — Z7982 Long term (current) use of aspirin: Secondary | ICD-10-CM

## 2018-01-21 DIAGNOSIS — Z8781 Personal history of (healed) traumatic fracture: Secondary | ICD-10-CM

## 2018-01-21 DIAGNOSIS — S0990XA Unspecified injury of head, initial encounter: Secondary | ICD-10-CM | POA: Diagnosis not present

## 2018-01-21 DIAGNOSIS — S199XXA Unspecified injury of neck, initial encounter: Secondary | ICD-10-CM | POA: Diagnosis not present

## 2018-01-21 LAB — CBC WITH DIFFERENTIAL/PLATELET
Abs Immature Granulocytes: 0.02 10*3/uL (ref 0.00–0.07)
BASOS ABS: 0 10*3/uL (ref 0.0–0.1)
Basophils Relative: 1 %
EOS ABS: 0.3 10*3/uL (ref 0.0–0.5)
EOS PCT: 4 %
HEMATOCRIT: 34.1 % — AB (ref 36.0–46.0)
Hemoglobin: 10.5 g/dL — ABNORMAL LOW (ref 12.0–15.0)
Immature Granulocytes: 0 %
Lymphocytes Relative: 13 %
Lymphs Abs: 0.9 10*3/uL (ref 0.7–4.0)
MCH: 28.3 pg (ref 26.0–34.0)
MCHC: 30.8 g/dL (ref 30.0–36.0)
MCV: 91.9 fL (ref 80.0–100.0)
Monocytes Absolute: 0.6 10*3/uL (ref 0.1–1.0)
Monocytes Relative: 9 %
NRBC: 0 % (ref 0.0–0.2)
Neutro Abs: 5 10*3/uL (ref 1.7–7.7)
Neutrophils Relative %: 73 %
Platelets: 192 10*3/uL (ref 150–400)
RBC: 3.71 MIL/uL — AB (ref 3.87–5.11)
RDW: 14.4 % (ref 11.5–15.5)
WBC: 6.7 10*3/uL (ref 4.0–10.5)

## 2018-01-21 LAB — BASIC METABOLIC PANEL
Anion gap: 5 (ref 5–15)
BUN: 11 mg/dL (ref 8–23)
CALCIUM: 8.2 mg/dL — AB (ref 8.9–10.3)
CO2: 26 mmol/L (ref 22–32)
CREATININE: 0.73 mg/dL (ref 0.44–1.00)
Chloride: 109 mmol/L (ref 98–111)
GFR calc non Af Amer: >60 mL/min (ref >60)
Glucose, Bld: 138 mg/dL — ABNORMAL HIGH (ref 70–99)
Potassium: 4.1 mmol/L (ref 3.5–5.1)
SODIUM: 140 mmol/L (ref 135–145)

## 2018-01-21 LAB — HEPATIC FUNCTION PANEL
ALBUMIN: 3.3 g/dL — AB (ref 3.5–5.0)
ALT: 25 U/L (ref 0–44)
AST: 47 U/L — AB (ref 15–41)
Alkaline Phosphatase: 73 U/L (ref 38–126)
BILIRUBIN DIRECT: 0.3 mg/dL — AB (ref 0.0–0.2)
Indirect Bilirubin: 0.5 mg/dL (ref 0.3–0.9)
Total Bilirubin: 0.8 mg/dL (ref 0.3–1.2)
Total Protein: 5.4 g/dL — ABNORMAL LOW (ref 6.5–8.1)

## 2018-01-21 LAB — PROTIME-INR
INR: 1.15
Prothrombin Time: 14.6 seconds (ref 11.4–15.2)

## 2018-01-21 MED ORDER — TIMOLOL MALEATE 0.5 % OP SOLN
1.0000 [drp] | Freq: Every day | OPHTHALMIC | Status: DC
  Administered 2018-01-23 – 2018-01-25 (×3): 1 [drp] via OPHTHALMIC
  Filled 2018-01-21: qty 5

## 2018-01-21 MED ORDER — TRAZODONE HCL 50 MG PO TABS: 50.0000 mg | ORAL_TABLET | Freq: Every evening | ORAL | Status: DC | PRN

## 2018-01-21 MED ORDER — TRAMADOL HCL 50 MG PO TABS: 25.0000 mg | ORAL_TABLET | Freq: Two times a day (BID) | ORAL | Status: DC | PRN

## 2018-01-21 MED ORDER — HYDROCODONE-ACETAMINOPHEN 5-325 MG PO TABS
1.0000 | ORAL_TABLET | Freq: Four times a day (QID) | ORAL | Status: DC | PRN
  Administered 2018-01-22 (×2): 1 via ORAL
  Filled 2018-01-21: qty 1
  Filled 2018-01-21: qty 2

## 2018-01-21 MED ORDER — POLYVINYL ALCOHOL 1.4 % OP SOLN: 1.0000 [drp] | Freq: Three times a day (TID) | OPHTHALMIC | Status: DC | PRN

## 2018-01-21 MED ORDER — MORPHINE SULFATE (PF) 2 MG/ML IV SOLN
0.5000 mg | INTRAVENOUS | Status: DC | PRN
  Administered 2018-01-22 – 2018-01-23 (×3): 0.5 mg via INTRAVENOUS
  Filled 2018-01-21 (×4): qty 1

## 2018-01-21 MED ORDER — METHOCARBAMOL 500 MG PO TABS
500.0000 mg | ORAL_TABLET | Freq: Four times a day (QID) | ORAL | Status: DC | PRN
  Administered 2018-01-22: 500 mg via ORAL
  Filled 2018-01-21: qty 1

## 2018-01-21 MED ORDER — METHOCARBAMOL 1000 MG/10ML IJ SOLN
500.0000 mg | Freq: Four times a day (QID) | INTRAVENOUS | Status: DC | PRN
  Filled 2018-01-21: qty 5

## 2018-01-21 MED ORDER — MORPHINE SULFATE (PF) 4 MG/ML IV SOLN
4.0000 mg | Freq: Once | INTRAVENOUS | Status: AC
  Administered 2018-01-21: 4 mg via INTRAVENOUS
  Filled 2018-01-21: qty 1

## 2018-01-21 MED ORDER — BISACODYL 10 MG RE SUPP
10.0000 mg | Freq: Every day | RECTAL | Status: DC | PRN
  Administered 2018-01-24: 10 mg via RECTAL
  Filled 2018-01-21: qty 1

## 2018-01-21 MED ORDER — PANTOPRAZOLE SODIUM 40 MG PO TBEC
40.0000 mg | DELAYED_RELEASE_TABLET | Freq: Every day | ORAL | Status: DC
  Administered 2018-01-23 – 2018-01-25 (×3): 40 mg via ORAL
  Filled 2018-01-21 (×3): qty 1

## 2018-01-21 NOTE — ED Provider Notes (Signed)
[facility] EMERGENCY DEPARTMENT Provider Note   CSN: [id number] Arrival date & time: 01/21/18  2035     History   Chief Complaint Chief Complaint  Patient presents with  . Fall    HPI Brianna Barnes is a 84 y.o. female with a past medical history of hypertension, osteoarthritis, osteoporosis, IDA who presents to ED for mechanical fall that occurred prior to arrival.  She was bending forward to pick up a pen off of the floor when she lost her balance after slipping on something.  She then fell and landed on her left side.  The fall was unwitnessed as patient lives at home by herself.  She denies any head injury or loss of consciousness.  She has not ambulated since the incident.  She denies any anticoagulant use, headache, vision changes, numbness in arms or legs, back pain, neck pain, chest pain. Per triage note, patient has felt intermittent dizziness today but she does not believe that the dizziness caused her to fall. No dizziness currently.  HPI  Past Medical History:  Diagnosis Date  . Choledocholithiasis   . Cirrhosis, cryptogenic (HCC)    FOLLOWED BY DR PYRTLE-- LOV  IN EPIC--  STABLE PER NOTE  . Cryptogenic cirrhosis ([place])   . Esophageal varix ([place])   . GERD (gastroesophageal reflux disease)   . History of colonoscopy with polypectomy    ADENOMATOUS AND HYPERPLASIA POLYPS  (2008  &  2010)  . History of hepatitis C   . History of positive PPD    + TB SKIN TEST WITHOUT TB  . Hyperlipidemia   . Hypertension, portal ([place])    SECONDARY TO CRYPTOGENIC CIRRHOSIS  . Internal hemorrhoids   . Iron deficiency anemia   . Loss of hearing    right ear  . Osteoarthritis    hands, knees, and low back  . Osteoporosis   . PUD (peptic ulcer disease)   . Tubular adenoma of colon   . Uterovaginal prolapse, incomplete   . Wears glasses   . Wears hearing aid    left ear only    Patient Active Problem List   Diagnosis Date Noted  . Bacteriuria, asymptomatic  11/04/2017  . Acute neck pain 11/04/2017  . Fatigue 11/04/2017  . Situational anxiety 11/04/2017  . Right foot pain 03/22/2017  . Right ankle pain 02/25/2017  . Chronic insomnia 04/03/2016  . Mild malnutrition ([place]) 01/30/2016  . BPPV (benign paroxysmal positional vertigo) 01/30/2016  . Counseling regarding end of life decision making 01/25/2014  . Choledocholithiasis 11/30/2012  . Cryptogenic cirrhosis ([place]) 11/22/2012  . Family history of early CAD 10/29/2010  . Vitamin D deficiency 06/24/2009  . ESSENTIAL HYPERTENSION, BENIGN 06/13/2009  . Hyperlipidemia 06/06/2009  . Iron deficiency anemia 09/17/2008  . GERD 09/17/2008  . PEPTIC ULCER DISEASE 09/17/2008  . UTEROVAGINAL PROLAPSE, INCOMPLETE 09/17/2008  . OSTEOARTHRITIS 09/17/2008  . Osteoporosis 09/17/2008  . ARTERIOVENOUS MALFORMATION 09/17/2008  . POSITIVE TB SKIN TEST, WITHOUT TUBERCULOSIS 09/17/2008  . COLONIC POLYPS, BENIGN, HX OF 09/17/2008    Past Surgical History:  Procedure Laterality Date  . APPENDECTOMY  1960'S  . CHOLECYSTECTOMY  08/25/2011   Procedure: LAPAROSCOPIC CHOLECYSTECTOMY WITH INTRAOPERATIVE CHOLANGIOGRAM;  Surgeon: Zenovia Jarred, MD;  Location: [place];  Service: General;  Laterality: N/A;  . ENDOSCOPIC RETROGRADE CHOLANGIOPANCREATOGRAPHY (ERCP) WITH PROPOFOL N/A 11/30/2012   Procedure: ENDOSCOPIC RETROGRADE CHOLANGIOPANCREATOGRAPHY (ERCP) WITH PROPOFOL;  Surgeon: Milus Banister, MD;  Location: [facility];  Service: Endoscopy;  Laterality: N/A;  .  EXTERNAL EAR SURGERY Left 1980  . HYSTEROSCOPY W/D&C N/A 07/15/2014   Procedure: DILATATION AND CURETTAGE /HYSTEROSCOPY;  Surgeon: Molli Posey, MD;  Location: [facility];  Service: Gynecology;  Laterality: N/A;  . KNEE ARTHROSCOPY W/ MENISCECTOMY Right 06-19-2002  . [place] SURGERY  1990  . REMOVAL OF URINARY SLING N/A 09/13/2013   Procedure: REMOVAL OF RETAINED PESSARY;  Surgeon: Jorja Loa, MD;  Location: [facility];  Service: Urology;  Laterality: N/A;     OB History   None      Home Medications    Prior to Admission medications   Medication Sig Start Date End Date Taking? Authorizing Provider  alendronate (FOSAMAX) 70 MG tablet TAKE 1 TABLET BY MOUTH ONCE A WEEK, AS DIRECTED Patient taking differently: Take 70 mg by mouth every Tuesday.  01/16/18  Yes Bedsole, [name] E, MD  aspirin EC 81 MG tablet Take 81 mg by mouth 3 (three) times a week.    Yes [provider]  Calcium-Phosphorus-Vitamin D (CALCIUM GUMMIES PO) Take 1 tablet by mouth daily.   Yes [provider]  Cholecalciferol (VITAMIN D3) 400 UNITS CAPS Take 400 mg by mouth daily.    Yes [provider]  dimenhyDRINATE (DRAMAMINE) 50 MG tablet Take 50 mg by mouth every 8 (eight) hours as needed for dizziness.   Yes [provider]  ibuprofen (ADVIL,MOTRIN) 200 MG tablet Take 400 mg by mouth daily as needed for headache or mild pain.    Yes [provider]  Iron Combinations (IRON COMPLEX PO) Take 1 tablet by mouth daily.   Yes [provider]  lidocaine (LIDODERM) 5 % Place 1 patch onto the skin daily. Remove & Discard patch within 12 hours or as directed by MD 11/04/17  Yes Diona Browner, [name] E, MD  Multiple Vitamins-Minerals (MULTI ADULT GUMMIES) CHEW Chew 1 tablet by mouth daily.   Yes [provider]  pantoprazole (PROTONIX) 40 MG tablet TAKE 1 TABLET BY MOUTH EVERY DAY Patient taking differently: Take 40 mg by mouth daily before breakfast.  11/17/17  Yes Bedsole, [name] E, MD  Propylene Glycol (SYSTANE COMPLETE) 0.6 % SOLN Place 1-2 drops into both eyes 3 (three) times daily as needed (for dryness).   Yes [provider]  sennosides-docusate sodium (SENOKOT-S) 8.6-50 MG tablet Take 1 tablet by mouth daily as needed for constipation.   Yes [provider]  simethicone (MYLICON) 614 MG chewable tablet Chew 125 mg by mouth every 6 (six) hours as needed for flatulence.    Yes [provider]  timolol (TIMOPTIC) 0.5 % ophthalmic solution Place 1 drop into both eyes daily.  01/22/15  Yes [provider]  traZODone (DESYREL) 50 MG tablet TAKE 2 TABLETS BY MOUTH AT BEDTIME AS NEEDED FOR SLEEP Patient taking differently: Take 50 mg by mouth at bedtime as needed for sleep.  01/16/18  Yes Bedsole, [name] E, MD  vitamin E 400 UNIT capsule Take 400 Units by mouth daily.   Yes [provider]  predniSONE (DELTASONE) 10 MG tablet Take 1 tablet (10 mg total) by mouth daily. 6,5,4,3,2,1 six day taper Patient not taking: Reported on 01/21/2018 06/12/17   Duanne Guess, PA-C  traMADol (ULTRAM) 50 MG tablet TAKE ONE-HALF TO ONE TABLET BY MOUTH UP TO EVERY 12 HOURS AS NEEDED FOR MODERATE PAIN. Patient taking differently: Take 25-50 mg by mouth See admin instructions. Take 25-50 mg by mouth up to every twelve hours as needed for moderate  pain 11/04/17   Jinny Sanders, MD    Family History Family History  Problem Relation Age of Onset  . Heart disease Mother   . Heart disease Father 61  . Hyperlipidemia Father   . Diabetes Father   . Cirrhosis Sister   . Diabetes Brother   . Heart disease Brother     Social History Social History   Tobacco Use  . Smoking status: Never Smoker  . Smokeless tobacco: Never Used  Substance Use Topics  . Alcohol use: No  . Drug use: No     Allergies   Asa [aspirin]   Review of Systems Review of Systems  Constitutional: Negative for appetite change, chills and fever.  HENT: Negative for ear pain, rhinorrhea, sneezing and sore throat.   Eyes: Negative for photophobia and visual disturbance.  Respiratory: Negative for cough, chest tightness, shortness of breath and wheezing.   Cardiovascular: Negative for chest pain and palpitations.  Gastrointestinal: Negative for abdominal pain, blood in stool, constipation, diarrhea, nausea and vomiting.  Genitourinary: Negative for dysuria, hematuria and urgency.    Musculoskeletal: Positive for arthralgias. Negative for myalgias.  Skin: Negative for rash.  Neurological: Negative for dizziness, weakness and light-headedness.     Physical Exam Updated Vital Signs BP (!) 143/65   Pulse 79   Temp 98 F (36.7 C) (Oral)   Resp 15   Ht 5\' 1"  (1.549 m)   Wt 49.2 kg   SpO2 97%   BMI 20.49 kg/m   Physical Exam  Constitutional: She is oriented to person, place, and time. She appears well-developed and well-nourished. No distress.  HENT:  Head: Normocephalic and atraumatic.  Nose: Nose normal.  Eyes: Pupils are equal, round, and reactive to light. Conjunctivae and EOM are normal. Right eye exhibits no discharge. Left eye exhibits no discharge. No scleral icterus.  Neck: Normal range of motion. Neck supple.  Cardiovascular: Normal rate, regular rhythm, normal heart sounds and intact distal pulses. Exam reveals no gallop and no friction rub.  No murmur heard. Pulmonary/Chest: Effort normal and breath sounds normal. No respiratory distress.  Abdominal: Soft. Bowel sounds are normal. She exhibits no distension. There is no tenderness. There is no guarding.  Musculoskeletal: Normal range of motion. She exhibits tenderness. She exhibits no edema.  Tenderness palpation of the left hip.  Left foot is internally rotated and shortened.  No gross deformity noted. 2+ DP pulse bilaterally.  Sensation intact to light touch of bilateral lower extremities. FROM of R hip.  Neurological: She is alert and oriented to person, place, and time. No cranial nerve deficit or sensory deficit. She exhibits normal muscle tone. Coordination normal.  Skin: Skin is warm and dry. No rash noted.  Psychiatric: She has a normal mood and affect.  Nursing note and vitals reviewed.    ED Treatments / Results  Labs (all labs ordered are listed, but only abnormal results are displayed) Labs Reviewed  BASIC METABOLIC PANEL - Abnormal; Notable for the following components:      Result  Value   Glucose, Bld 138 (*)    Calcium 8.2 (*)    All other components within normal limits  CBC WITH DIFFERENTIAL/PLATELET - Abnormal; Notable for the following components:   RBC 3.71 (*)    Hemoglobin 10.5 (*)    HCT 34.1 (*)    All other components within normal limits  URINE CULTURE  URINALYSIS, ROUTINE W REFLEX MICROSCOPIC  PROTIME-INR  TYPE AND SCREEN    EKG EKG  Interpretation  Date/Time:  Saturday January 21 2018 20:41:08 EST Ventricular Rate:  71 PR Interval:    QRS Duration: 92 QT Interval:  405 QTC Calculation: 441 R Axis:   51 Text Interpretation:  Sinus rhythm Abnormal R-wave progression, early transition Borderline repolarization abnormality No significant change since last tracing Confirmed by Wandra Arthurs [phone]) on 01/21/2018 9:21:31 PM   Radiology Dg Chest 1 View  Result Date: 01/21/2018 CLINICAL DATA:  Left hip pain after fall. EXAM: CHEST  1 VIEW COMPARISON:  Radiographs of August 24, 2011. FINDINGS: The heart size and mediastinal contours are within normal limits. Atherosclerosis of thoracic aorta is noted. Both lungs are clear. The visualized skeletal structures are unremarkable. IMPRESSION: No acute cardiopulmonary abnormality seen. Aortic Atherosclerosis (ICD10-I70.0). Electronically Signed   By: Marijo Conception, M.D.   On: 01/21/2018 21:34   Ct Head Wo Contrast  Result Date: 01/21/2018 CLINICAL DATA:  Status post fall onto left side. Concern for head or cervical spine injury. Initial encounter. EXAM: CT HEAD WITHOUT CONTRAST CT CERVICAL SPINE WITHOUT CONTRAST TECHNIQUE: Multidetector CT imaging of the head and cervical spine was performed following the standard protocol without intravenous contrast. Multiplanar CT image reconstructions of the cervical spine were also generated. COMPARISON:  Cervical spine radiograph performed 11/04/2017 FINDINGS: CT HEAD FINDINGS Brain: No evidence of acute infarction, hemorrhage, hydrocephalus, extra-axial collection or mass  lesion / mass effect. Prominence of the ventricles and sulci reflects mild cortical volume loss. Scattered periventricular and subcortical white matter change likely reflects small vessel ischemic microangiopathy. Mild cerebellar atrophy is noted. The brainstem and fourth ventricle are within normal limits. The basal ganglia are unremarkable in appearance. The cerebral hemispheres demonstrate grossly normal gray-white differentiation. No mass effect or midline shift is seen. Vascular: No hyperdense vessel or unexpected calcification. Skull: There is no evidence of fracture; visualized osseous structures are unremarkable in appearance. Sinuses/Orbits: The orbits are within normal limits. There is opacification and mucoperiosteal thickening at the left side of the sphenoid sinus. The remaining paranasal sinuses and mastoid air cells are well-aerated. Other: No significant soft tissue abnormalities are seen. CT CERVICAL SPINE FINDINGS Alignment: Normal. Skull base and vertebrae: No acute fracture. No primary bone lesion or focal pathologic process. Soft tissues and spinal canal: No prevertebral fluid or swelling. No visible canal hematoma. Disc levels: There is mild intervertebral disc space narrowing at C4-C5. Intervertebral disc spaces are grossly preserved. Upper chest: The visualized lung apices are clear. The thyroid gland is unremarkable. Calcification is seen at the carotid bifurcations bilaterally. Other: No additional soft tissue abnormalities are seen. IMPRESSION: 1. No evidence of traumatic intracranial injury or fracture. 2. No evidence of fracture or subluxation along the cervical spine. 3. Mild cortical volume loss and scattered small vessel ischemic microangiopathy. 4. Opacification and mucoperiosteal thickening at the left side of the sphenoid sinus. 5. Calcification at the carotid bifurcations bilaterally. Carotid ultrasound would be helpful for further evaluation, when and as deemed clinically  appropriate. Electronically Signed   By: Garald Balding M.D.   On: 01/21/2018 21:51   Ct Cervical Spine Wo Contrast  Result Date: 01/21/2018 CLINICAL DATA:  Status post fall onto left side. Concern for head or cervical spine injury. Initial encounter. EXAM: CT HEAD WITHOUT CONTRAST CT CERVICAL SPINE WITHOUT CONTRAST TECHNIQUE: Multidetector CT imaging of the head and cervical spine was performed following the standard protocol without intravenous contrast. Multiplanar CT image reconstructions of the cervical spine were also generated. COMPARISON:  Cervical spine radiograph performed 11/04/2017  FINDINGS: CT HEAD FINDINGS Brain: No evidence of acute infarction, hemorrhage, hydrocephalus, extra-axial collection or mass lesion / mass effect. Prominence of the ventricles and sulci reflects mild cortical volume loss. Scattered periventricular and subcortical white matter change likely reflects small vessel ischemic microangiopathy. Mild cerebellar atrophy is noted. The brainstem and fourth ventricle are within normal limits. The basal ganglia are unremarkable in appearance. The cerebral hemispheres demonstrate grossly normal gray-white differentiation. No mass effect or midline shift is seen. Vascular: No hyperdense vessel or unexpected calcification. Skull: There is no evidence of fracture; visualized osseous structures are unremarkable in appearance. Sinuses/Orbits: The orbits are within normal limits. There is opacification and mucoperiosteal thickening at the left side of the sphenoid sinus. The remaining paranasal sinuses and mastoid air cells are well-aerated. Other: No significant soft tissue abnormalities are seen. CT CERVICAL SPINE FINDINGS Alignment: Normal. Skull base and vertebrae: No acute fracture. No primary bone lesion or focal pathologic process. Soft tissues and spinal canal: No prevertebral fluid or swelling. No visible canal hematoma. Disc levels: There is mild intervertebral disc space narrowing  at C4-C5. Intervertebral disc spaces are grossly preserved. Upper chest: The visualized lung apices are clear. The thyroid gland is unremarkable. Calcification is seen at the carotid bifurcations bilaterally. Other: No additional soft tissue abnormalities are seen. IMPRESSION: 1. No evidence of traumatic intracranial injury or fracture. 2. No evidence of fracture or subluxation along the cervical spine. 3. Mild cortical volume loss and scattered small vessel ischemic microangiopathy. 4. Opacification and mucoperiosteal thickening at the left side of the sphenoid sinus. 5. Calcification at the carotid bifurcations bilaterally. Carotid ultrasound would be helpful for further evaluation, when and as deemed clinically appropriate. Electronically Signed   By: Garald Balding M.D.   On: 01/21/2018 21:51   Dg Hip Unilat W Or Wo Pelvis 2-3 Views Left  Result Date: 01/21/2018 CLINICAL DATA:  Left hip pain after fall today. EXAM: DG HIP (WITH OR WITHOUT PELVIS) 2-3V LEFT COMPARISON:  None. FINDINGS: Moderately displaced and comminuted fracture is seen involving the intertrochanteric portion of the proximal left femur. No other abnormality is noted. IMPRESSION: Moderately displaced and comminuted intertrochanteric fracture of proximal left femur. Electronically Signed   By: Marijo Conception, M.D.   On: 01/21/2018 21:33    Procedures Procedures (including critical care time)  Medications Ordered in ED Medications  morphine 4 MG/ML injection 4 mg (has no administration in time range)     Initial Impression / Assessment and Plan / ED Course  I have reviewed the triage vital signs and the nursing notes.  Pertinent labs & imaging results that were available during my care of the patient were reviewed by me and considered in my medical decision making (see chart for details).     84 year old female presents to ED for left hip pain that occurred prior to arrival.  States that she was bending forward to pick up a  pen when she lost her balance and slipped.  She landed on her left side.  Denies any head injury or loss of consciousness.  The fall was unwitnessed as patient lives by herself.  Denies any anticoagulant use, headache, vision changes or changes to sensation of arms or legs.  On exam there is tenderness palpation of the left hip which is internally rotated and shortened compared to the right.  Lower extremities are neurovascularly intact.  No C, T or L-spine tenderness to palpation noted.  She is alert and oriented x4.  X-ray of the hip  shows moderately displaced and comminuted intertrochanteric fracture on the left.  Chest x-ray is negative.  CBC shows hemoglobin of 10.5.  Remainder of lab work and imaging studies pending.  Will consult orthopedist.  10:10 PM CT of the head and neck are negative.  BMP unremarkable.  INR pending.  Foley catheter to be placed for urine.  Spoke to Dr. [name] of orthopedic surgery who recommends keeping patient n.p.o. after midnight for surgery in the a.m. Hospitalist to admit.  Patient discussed with and seen by my attending, Dr. Darl Householder.    Portions of this note were generated with [employer]. Dictation errors may occur despite best attempts at proofreading.   Final Clinical Impressions(s) / ED Diagnoses   Final diagnoses:  Closed displaced intertrochanteric fracture of left femur, initial encounter [facility])    ED Discharge Orders    None       Delia Heady, PA-C 01/21/18 2214    Drenda Freeze, MD 01/22/18 [phone]

## 2018-01-21 NOTE — Progress Notes (Signed)
Patient ID: Brianna Barnes, female   DOB: [birth year], 84 y.o.   MRN: [id number] I have reviewed the patient's x-rays.  She does have a left hip intertrochanteric fracture.  She will be put on the OR schedule for the morning for surgical fixation.  Appreciate the Medicine Service admitting and providing medical management.

## 2018-01-21 NOTE — H&P (Signed)
Brianna Barnes:[id number] DOB: [birth year] DOA: 01/21/2018     PCP: Jinny Sanders, MD   [facility]    GI Dr.  Chryl Heck) [facility] Dr. Tamala Julian  Patient arrived to ER on 01/21/18 at 2035  Patient coming from: home Lives alone,     Chief Complaint:  Chief Complaint  Patient presents with  . Fall    HPI: Brianna Barnes is a 84 y.o. female with medical history significant of cirrhosis, HTN, anemia Vit D deficiency    Presented with conical fall that occurred at home today when she lost her balance on the left hip denies head injury noted to have shortening of left foot. She was bending forward to pick something off the floor. Not on any anticoagulation.  Denies any chest pain able to walk a flight of stairs or get mail she is fairly active walks unassisted.  Has occasional episode of feeling dizzy unstable on her feet at time when she ambulates. NO syncope no vertigo   Regarding pertinent Chronic problems: Hx of HTN currently not on any medication.   hx of NASH- followed by GI have had [place] every 6 m No hx of Gi bleed    While in ER: Noted to have left hip intertrochanteric fracture discussed with Dr. [name] who will see patient make n.p.o. postmidnight plan to operate in a.m. The following Work up has been ordered so far:  Orders Placed This Encounter  Procedures  . Urine culture  . DG Hip Unilat W or Wo Pelvis 2-3 Views Left  . CT Head Wo Contrast  . CT Cervical Spine Wo Contrast  . DG Chest 1 View  . Basic metabolic panel  . CBC with Differential  . Urinalysis, Routine w reflex microscopic  . Protime-INR  . Insert foley catheter  . Consult to orthopedic surgery  . Consult to hospitalist  . EKG 12-Lead  . Type and screen [place]      Following Medications were ordered in ER: Medications  morphine 4 MG/ML injection 4 mg (has no administration in time range)    Significant initial  Findings: Abnormal Labs Reviewed    BASIC METABOLIC PANEL - Abnormal; Notable for the following components:      Result Value   Glucose, Bld 138 (*)    Calcium 8.2 (*)    All other components within normal limits  CBC WITH DIFFERENTIAL/PLATELET - Abnormal; Notable for the following components:   RBC 3.71 (*)    Hemoglobin 10.5 (*)    HCT 34.1 (*)    All other components within normal limits     Na 140 K 4.1  Cr    Stable,  Lab Results  Component Value Date   CREATININE 0.73 01/21/2018   CREATININE 0.65 11/04/2017   CREATININE 0.65 02/25/2017      WBC  6.7  HG/HCT slightly down from 11.8 in August    Component Value Date/Time   HGB 10.5 (L) 01/21/2018 2047   HGB 12.8 05/06/2015 1003   HCT 34.1 (L) 01/21/2018 2047   HCT 38.1 05/06/2015 1003     Troponin (Point of Care Test) No results for input(s): TROPIPOC in the last 72 hours.      UA  not ordered   CT HEAD/NEck NON acute - Calcification at the carotid bifurcations bilaterally. Carotid ultrasound would be helpful  CXR -  NON acute Left Hip - Moderately displaced and comminuted intertrochanteric fracture of proximal left femur.  ECG:  Personally reviewed by me showing: HR : 71 Rhythm:  NSR  no evidence of ischemic changes QTC 441     ED Triage Vitals  Enc Vitals Group     BP 01/21/18 2035 (!) 169/70     Pulse Rate 01/21/18 2042 79     Resp 01/21/18 2042 18     Temp 01/21/18 2042 98 F (36.7 C)     Temp Source 01/21/18 2042 Oral     SpO2 01/21/18 2042 100 %     Weight 01/21/18 2041 108 lb 7.5 oz (49.2 kg)     Height 01/21/18 2041 5\' 1"  (1.549 m)     Head Circumference --      Peak Flow --      Pain Score --      Pain Loc --      Pain Edu? --      Excl. in [place]? --   TMAX(24)@       Latest  Blood pressure (!) 143/65, pulse 79, temperature 98 F (36.7 C), temperature source Oral, resp. rate 15, height 5\' 1"  (1.549 m), weight 49.2 kg, SpO2 97 %.    ER Provider Called: Orthopedics   Dr.Blackman  They Recommend n.p.o.  postmidnight plan to operate in a.m. Will see in AM   Hospitalist was called for admission for left hip fracture   Review of Systems:    Pertinent positives include: dizziness, joint pains,  heartburn,  Constitutional:  No weight loss, night sweats, Fevers, chills, fatigue, weight loss  HEENT:  No headaches, Difficulty swallowing,Tooth/dental problems,Sore throat,  No sneezing, itching, ear ache, nasal congestion, post nasal drip,  Cardio-vascular:  No chest pain, Orthopnea, PND, anasarca, palpitations.no Bilateral lower extremity swelling  GI:  No indigestion, abdominal pain, nausea, vomiting, diarrhea, change in bowel habits, loss of appetite, melena, blood in stool, hematemesis Resp:  no shortness of breath at rest. No dyspnea on exertion, No excess mucus, no productive cough, No non-productive cough, No coughing up of blood.No change in color of mucus.No wheezing. Skin:  no rash or lesions. No jaundice GU:  no dysuria, change in color of urine, no urgency or frequency. No straining to urinate.  No flank pain.  Musculoskeletal:  No joint pain or no joint swelling. No decreased range of motion. No back pain.  Psych:  No change in mood or affect. No depression or anxiety. No memory loss.  Neuro: no localizing neurological complaints, no tingling, no weakness, no double vision, no gait abnormality, no slurred speech, no confusion  All systems reviewed and apart from [place] all are negative  Past Medical History:   Past Medical History:  Diagnosis Date  . Choledocholithiasis   . Cirrhosis, cryptogenic (HCC)    FOLLOWED BY DR PYRTLE-- LOV  IN EPIC--  STABLE PER NOTE  . Cryptogenic cirrhosis ([place])   . Esophageal varix ([place])   . GERD (gastroesophageal reflux disease)   . History of colonoscopy with polypectomy    ADENOMATOUS AND HYPERPLASIA POLYPS  (2008  &  2010)  . History of hepatitis C   . History of positive PPD    + TB SKIN TEST WITHOUT TB  . Hyperlipidemia   .  Hypertension, portal ([place])    SECONDARY TO CRYPTOGENIC CIRRHOSIS  . Internal hemorrhoids   . Iron deficiency anemia   . Loss of hearing    right ear  . Osteoarthritis    hands, knees, and low back  . Osteoporosis   . PUD (peptic  ulcer disease)   . Tubular adenoma of colon   . Uterovaginal prolapse, incomplete   . Wears glasses   . Wears hearing aid    left ear only      Past Surgical History:  Procedure Laterality Date  . APPENDECTOMY  1960'S  . CHOLECYSTECTOMY  08/25/2011   Procedure: LAPAROSCOPIC CHOLECYSTECTOMY WITH INTRAOPERATIVE CHOLANGIOGRAM;  Surgeon: Zenovia Jarred, MD;  Location: [facility];  Service: General;  Laterality: N/A;  . ENDOSCOPIC RETROGRADE CHOLANGIOPANCREATOGRAPHY (ERCP) WITH PROPOFOL N/A 11/30/2012   Procedure: ENDOSCOPIC RETROGRADE CHOLANGIOPANCREATOGRAPHY (ERCP) WITH PROPOFOL;  Surgeon: Milus Banister, MD;  Location: [facility];  Service: Endoscopy;  Laterality: N/A;  . EXTERNAL EAR SURGERY Left 1980  . HYSTEROSCOPY W/D&C N/A 07/15/2014   Procedure: DILATATION AND CURETTAGE /HYSTEROSCOPY;  Surgeon: Molli Posey, MD;  Location[facility];  Service: Gynecology;  Laterality: N/A;  . KNEE ARTHROSCOPY W/ MENISCECTOMY Right 06-19-2002  . [place] SURGERY  1990  . REMOVAL OF URINARY SLING N/A 09/13/2013   Procedure: REMOVAL OF RETAINED PESSARY;  Surgeon: Jorja Loa, MD;  Location: [facility];  Service: Urology;  Laterality: N/A;    Social History:  Ambulatory  independently       reports that she has never smoked. She has never used smokeless tobacco. She reports that she does not drink alcohol or use drugs.     Family History:   Family History  Problem Relation Age of Onset  . Heart disease Mother   . Heart disease Father 66  . Hyperlipidemia Father   . Diabetes Father   . Cirrhosis Sister   . Diabetes Brother   . Heart disease Brother     Allergies: Allergies  Allergen Reactions  . Asa [Aspirin]  Nausea Only and Other (See Comments)    Stomach is a bit sensitive     Prior to Admission medications   Medication Sig Start Date End Date Taking? Authorizing Provider  alendronate (FOSAMAX) 70 MG tablet TAKE 1 TABLET BY MOUTH ONCE A WEEK, AS DIRECTED Patient taking differently: Take 70 mg by mouth every Tuesday.  01/16/18  Yes Bedsole, [name] E, MD  aspirin EC 81 MG tablet Take 81 mg by mouth 3 (three) times a week.    Yes [provider]  Calcium-Phosphorus-Vitamin D (CALCIUM GUMMIES PO) Take 1 tablet by mouth daily.   Yes [provider]  Cholecalciferol (VITAMIN D3) 400 UNITS CAPS Take 400 mg by mouth daily.    Yes [provider]  dimenhyDRINATE (DRAMAMINE) 50 MG tablet Take 50 mg by mouth every 8 (eight) hours as needed for dizziness.   Yes [provider]  ibuprofen (ADVIL,MOTRIN) 200 MG tablet Take 400 mg by mouth daily as needed for headache or mild pain.    Yes [provider]  Iron Combinations (IRON COMPLEX PO) Take 1 tablet by mouth daily.   Yes [provider]  lidocaine (LIDODERM) 5 % Place 1 patch onto the skin daily. Remove & Discard patch within 12 hours or as directed by MD 11/04/17  Yes Diona Browner, [name] E, MD  Multiple Vitamins-Minerals (MULTI ADULT GUMMIES) CHEW Chew 1 tablet by mouth daily.   Yes [provider]  pantoprazole (PROTONIX) 40 MG tablet TAKE 1 TABLET BY MOUTH EVERY DAY Patient taking differently: Take 40 mg by mouth daily before breakfast.  11/17/17  Yes Bedsole, [name] E, MD  Propylene Glycol (SYSTANE COMPLETE) 0.6 % SOLN Place 1-2 drops into both eyes 3 (three) times daily  as needed (for dryness).   Yes [provider]  sennosides-docusate sodium (SENOKOT-S) 8.6-50 MG tablet Take 1 tablet by mouth daily as needed for constipation.   Yes [provider]  simethicone (MYLICON) 893 MG chewable tablet Chew 125 mg by mouth every 6 (six) hours as needed for flatulence.   Yes [provider]   timolol (TIMOPTIC) 0.5 % ophthalmic solution Place 1 drop into both eyes daily.  01/22/15  Yes [provider]  traZODone (DESYREL) 50 MG tablet TAKE 2 TABLETS BY MOUTH AT BEDTIME AS NEEDED FOR SLEEP Patient taking differently: Take 50 mg by mouth at bedtime as needed for sleep.  01/16/18  Yes Bedsole, [name] E, MD  vitamin E 400 UNIT capsule Take 400 Units by mouth daily.   Yes [provider]  predniSONE (DELTASONE) 10 MG tablet Take 1 tablet (10 mg total) by mouth daily. 6,5,4,3,2,1 six day taper Patient not taking: Reported on 01/21/2018 06/12/17   Duanne Guess, PA-C  traMADol (ULTRAM) 50 MG tablet TAKE ONE-HALF TO ONE TABLET BY MOUTH UP TO EVERY 12 HOURS AS NEEDED FOR MODERATE PAIN. Patient taking differently: Take 25-50 mg by mouth See admin instructions. Take 25-50 mg by mouth up to every twelve hours as needed for moderate pain 11/04/17   Jinny Sanders, MD   Physical Exam: Blood pressure (!) 143/65, pulse 79, temperature 98 F (36.7 C), temperature source Oral, resp. rate 15, height 5\' 1"  (1.549 m), weight 49.2 kg, SpO2 97 %. 1. General:  in No Acute distress  well  -appearing 2. Psychological: Alert and   Oriented 3. Head/ENT:     Dry Mucous Membranes                          Head Non traumatic, neck supple                          Poor Dentition 4. SKIN:  decreased Skin turgor,  Skin clean Dry and intact no rash 5. Heart: Regular rate and rhythm no  Murmur, no Rub or gallop 6. Lungs:  Clear to auscultation bilaterally, no wheezes or crackles   7. Abdomen: Soft, non-tender, Non distended  bowel sounds present 8. Lower extremities: no clubbing, cyanosis, or  Edema, Left leg shortened  9. Neurologically Grossly intact, moving all 4 extremities equally   10. MSK: Normal range of motion except left leg   LABS:     Recent Labs  Lab 01/21/18 2047  WBC 6.7  NEUTROABS 5.0  HGB 10.5*  HCT 34.1*  MCV 91.9  PLT 734   Basic Metabolic Panel: Recent Labs  Lab  01/21/18 2047  NA 140  K 4.1  CL 109  CO2 26  GLUCOSE 138*  BUN 11  CREATININE 0.73  CALCIUM 8.2*      No results for input(s): AST, ALT, ALKPHOS, BILITOT, PROT, ALBUMIN in the last 168 hours. No results for input(s): LIPASE, AMYLASE in the last 168 hours. No results for input(s): AMMONIA in the last 168 hours.    HbA1C: No results for input(s): HGBA1C in the last 72 hours. CBG: No results for input(s): GLUCAP in the last 168 hours.    Urine analysis:    Component Value Date/Time   COLORURINE YELLOW 05/01/2011 [place] 05/01/2011 1347   LABSPEC 1.013 05/01/2011 1347   PHURINE 7.0 05/01/2011 1347   GLUCOSEU NEGATIVE 05/01/2011 1347  HGBUR NEGATIVE 05/01/2011 1347   BILIRUBINUR Negative 11/04/2017 1223   KETONESUR NEGATIVE 05/01/2011 1347   PROTEINUR Positive (A) 11/04/2017 1223   PROTEINUR NEGATIVE 05/01/2011 1347   UROBILINOGEN 0.2 11/04/2017 1223   UROBILINOGEN 0.2 05/01/2011 1347   NITRITE Negative 11/04/2017 1223   NITRITE NEGATIVE 05/01/2011 1347   LEUKOCYTESUR Moderate (2+) (A) 11/04/2017 1223       Cultures: No results found for: SDES, SPECREQUEST, CULT, REPTSTATUS   Radiological Exams on Admission: Dg Chest 1 View  Result Date: 01/21/2018 CLINICAL DATA:  Left hip pain after fall. EXAM: CHEST  1 VIEW COMPARISON:  Radiographs of August 24, 2011. FINDINGS: The heart size and mediastinal contours are within normal limits. Atherosclerosis of thoracic aorta is noted. Both lungs are clear. The visualized skeletal structures are unremarkable. IMPRESSION: No acute cardiopulmonary abnormality seen. Aortic Atherosclerosis (ICD10-I70.0). Electronically Signed   By: Marijo Conception, M.D.   On: 01/21/2018 21:34   Ct Head Wo Contrast  Result Date: 01/21/2018 CLINICAL DATA:  Status post fall onto left side. Concern for head or cervical spine injury. Initial encounter. EXAM: CT HEAD WITHOUT CONTRAST CT CERVICAL SPINE WITHOUT CONTRAST TECHNIQUE:  Multidetector CT imaging of the head and cervical spine was performed following the standard protocol without intravenous contrast. Multiplanar CT image reconstructions of the cervical spine were also generated. COMPARISON:  Cervical spine radiograph performed 11/04/2017 FINDINGS: CT HEAD FINDINGS Brain: No evidence of acute infarction, hemorrhage, hydrocephalus, extra-axial collection or mass lesion / mass effect. Prominence of the ventricles and sulci reflects mild cortical volume loss. Scattered periventricular and subcortical white matter change likely reflects small vessel ischemic microangiopathy. Mild cerebellar atrophy is noted. The brainstem and fourth ventricle are within normal limits. The basal ganglia are unremarkable in appearance. The cerebral hemispheres demonstrate grossly normal gray-white differentiation. No mass effect or midline shift is seen. Vascular: No hyperdense vessel or unexpected calcification. Skull: There is no evidence of fracture; visualized osseous structures are unremarkable in appearance. Sinuses/Orbits: The orbits are within normal limits. There is opacification and mucoperiosteal thickening at the left side of the sphenoid sinus. The remaining paranasal sinuses and mastoid air cells are well-aerated. Other: No significant soft tissue abnormalities are seen. CT CERVICAL SPINE FINDINGS Alignment: Normal. Skull base and vertebrae: No acute fracture. No primary bone lesion or focal pathologic process. Soft tissues and spinal canal: No prevertebral fluid or swelling. No visible canal hematoma. Disc levels: There is mild intervertebral disc space narrowing at C4-C5. Intervertebral disc spaces are grossly preserved. Upper chest: The visualized lung apices are clear. The thyroid gland is unremarkable. Calcification is seen at the carotid bifurcations bilaterally. Other: No additional soft tissue abnormalities are seen. IMPRESSION: 1. No evidence of traumatic intracranial injury or  fracture. 2. No evidence of fracture or subluxation along the cervical spine. 3. Mild cortical volume loss and scattered small vessel ischemic microangiopathy. 4. Opacification and mucoperiosteal thickening at the left side of the sphenoid sinus. 5. Calcification at the carotid bifurcations bilaterally. Carotid ultrasound would be helpful for further evaluation, when and as deemed clinically appropriate. Electronically Signed   By: Garald Balding M.D.   On: 01/21/2018 21:51   Ct Cervical Spine Wo Contrast  Result Date: 01/21/2018 CLINICAL DATA:  Status post fall onto left side. Concern for head or cervical spine injury. Initial encounter. EXAM: CT HEAD WITHOUT CONTRAST CT CERVICAL SPINE WITHOUT CONTRAST TECHNIQUE: Multidetector CT imaging of the head and cervical spine was performed following the standard protocol without intravenous contrast. Multiplanar CT  image reconstructions of the cervical spine were also generated. COMPARISON:  Cervical spine radiograph performed 11/04/2017 FINDINGS: CT HEAD FINDINGS Brain: No evidence of acute infarction, hemorrhage, hydrocephalus, extra-axial collection or mass lesion / mass effect. Prominence of the ventricles and sulci reflects mild cortical volume loss. Scattered periventricular and subcortical white matter change likely reflects small vessel ischemic microangiopathy. Mild cerebellar atrophy is noted. The brainstem and fourth ventricle are within normal limits. The basal ganglia are unremarkable in appearance. The cerebral hemispheres demonstrate grossly normal gray-white differentiation. No mass effect or midline shift is seen. Vascular: No hyperdense vessel or unexpected calcification. Skull: There is no evidence of fracture; visualized osseous structures are unremarkable in appearance. Sinuses/Orbits: The orbits are within normal limits. There is opacification and mucoperiosteal thickening at the left side of the sphenoid sinus. The remaining paranasal sinuses and  mastoid air cells are well-aerated. Other: No significant soft tissue abnormalities are seen. CT CERVICAL SPINE FINDINGS Alignment: Normal. Skull base and vertebrae: No acute fracture. No primary bone lesion or focal pathologic process. Soft tissues and spinal canal: No prevertebral fluid or swelling. No visible canal hematoma. Disc levels: There is mild intervertebral disc space narrowing at C4-C5. Intervertebral disc spaces are grossly preserved. Upper chest: The visualized lung apices are clear. The thyroid gland is unremarkable. Calcification is seen at the carotid bifurcations bilaterally. Other: No additional soft tissue abnormalities are seen. IMPRESSION: 1. No evidence of traumatic intracranial injury or fracture. 2. No evidence of fracture or subluxation along the cervical spine. 3. Mild cortical volume loss and scattered small vessel ischemic microangiopathy. 4. Opacification and mucoperiosteal thickening at the left side of the sphenoid sinus. 5. Calcification at the carotid bifurcations bilaterally. Carotid ultrasound would be helpful for further evaluation, when and as deemed clinically appropriate. Electronically Signed   By: Garald Balding M.D.   On: 01/21/2018 21:51   Dg Hip Unilat W Or Wo Pelvis 2-3 Views Left  Result Date: 01/21/2018 CLINICAL DATA:  Left hip pain after fall today. EXAM: DG HIP (WITH OR WITHOUT PELVIS) 2-3V LEFT COMPARISON:  None. FINDINGS: Moderately displaced and comminuted fracture is seen involving the intertrochanteric portion of the proximal left femur. No other abnormality is noted. IMPRESSION: Moderately displaced and comminuted intertrochanteric fracture of proximal left femur. Electronically Signed   By: Marijo Conception, M.D.   On: 01/21/2018 21:33    Chart has been reviewed    Assessment/Plan  84 y.o. female with medical history significant of cirrhosis, HTN, anemia Vit D deficiency  Admitted for left hip fracture  Present on Admission: . Closed fracture  of femur, intertrochanteric, left, initial encounter ([place]) - - management as per orthopedics,  plan to operate   in  a.m.  Keep nothing by mouth post midnight. Patient  not on anticoagulation hold aspirin  Ordered type and screen, Place Foley, order a vitamin D level  Patient at baseline able to walk a flight of stairs or 100 feet and is very active  Patient denies any chest pain or shortness of breath currently and/or with exertion,  ECG showing no evidence of acute ischemia  no known history of coronary artery disease but hx of   Liver failure  Stable IN R 1.15   Given advanced age patient is at least moderate  risk  which has been discussed with family but at this point no furthther cardiac workup is indicated.     . Vitamin D deficiency  - will recheck Vitamin D level . Hyperlipidemia  -stable  restart home medications when able to tolerate . Iron deficiency anemia -anemia panel and restart home medications when able obtain type and screen transfuse as needed . Essential hypertension, benign -  currently stable  Not on any home medications . GERD - stable . Osteoporosis patient on Fosamax at baseline . Cryptogenic cirrhosis ([place]) -  followed by GI currently stable . Carotid artery calcification, bilateral -patient has been having intermittent's "dizzy spells" will obtain Dopplers to further evaluate   Other plan as per orders.  DVT prophylaxis:  SCD      Code Status:    DNR/DNI  as per patient and family  I had personally discussed CODE STATUS with patient and family   Family Communication:   Family at  Bedside  plan of care was discussed with   Son, Daughter   Disposition Plan:     likely will need placement for rehabilitation                     Swallow eval - SLP ordered                   Social Work  consulted                Consults called: orthopedics   Admission status:    inpatient     Expect 2 midnight stay secondary to severity of patient's current illness   Severe radiological abnormalities including hip fracture and extensive comorbidities including: Liver disease That are currently affecting medical management.  I expect  patient to be hospitalized for 2 midnights requiring inpatient medical care.  Patient is at high risk for adverse outcome (such as loss of life or disability) if not treated.  Indication for inpatient stay as follows:    severe pain requiring acute inpatient management,  inability to maintain oral hydration    Need for operative intervention Need for , IV fluid IV medications       Level of care      medical floor           Keasia Dubose 01/22/2018, 12:07 AM    [facility]  Pager [phone]   after 2 AM please page floor coverage PA If 7AM-7PM, please contact the day team taking care of the patient  [email or web address]  Password [email or web address]

## 2018-01-21 NOTE — ED Triage Notes (Signed)
Per EMS, patient from home, lost her balance when she was standing up and had a mechanical fall on her left hip. No LOC, did not hit head and not on blood thinners. No obvious deformity to hip but some shortening left foot. Patient did mention she was feeling dizzy throughout the day but she was not dizzy prior to the fall. Patient aox4.

## 2018-01-21 NOTE — ED Notes (Signed)
Placed an external urinary catheter on the patient. 

## 2018-01-21 NOTE — ED Notes (Signed)
Pure wick  Placed.

## 2018-01-22 ENCOUNTER — Encounter (HOSPITAL_COMMUNITY)
Admission: EM | Disposition: A | Discharge status: Skilled Nursing Facility | Payer: Self-pay | Source: Home / Self Care | Attending: Internal Medicine

## 2018-01-22 ENCOUNTER — Inpatient Hospital Stay (HOSPITAL_COMMUNITY): Payer: Medicare Other | Admitting: Certified Registered Nurse Anesthetist

## 2018-01-22 ENCOUNTER — Inpatient Hospital Stay (HOSPITAL_COMMUNITY): Payer: Medicare Other

## 2018-01-22 ENCOUNTER — Encounter (HOSPITAL_COMMUNITY): Payer: Self-pay | Admitting: Surgery

## 2018-01-22 ENCOUNTER — Other Ambulatory Visit: Payer: Self-pay

## 2018-01-22 DIAGNOSIS — S72142A Displaced intertrochanteric fracture of left femur, initial encounter for closed fracture: Principal | ICD-10-CM

## 2018-01-22 HISTORY — PX: INTRAMEDULLARY (IM) NAIL INTERTROCHANTERIC: SHX5875

## 2018-01-22 LAB — BASIC METABOLIC PANEL
Anion gap: 5 (ref 5–15)
BUN: 12 mg/dL (ref 8–23)
CHLORIDE: 110 mmol/L (ref 98–111)
CO2: 25 mmol/L (ref 22–32)
Calcium: 8.1 mg/dL — ABNORMAL LOW (ref 8.9–10.3)
Creatinine, Ser: 0.59 mg/dL (ref 0.44–1.00)
GFR calc Af Amer: >60 mL/min (ref >60)
GFR calc non Af Amer: >60 mL/min (ref >60)
GLUCOSE: 133 mg/dL — AB (ref 70–99)
POTASSIUM: 3.8 mmol/L (ref 3.5–5.1)
Sodium: 140 mmol/L (ref 135–145)

## 2018-01-22 LAB — URINALYSIS, ROUTINE W REFLEX MICROSCOPIC
BILIRUBIN URINE: NEGATIVE
Bacteria, UA: NONE SEEN
Glucose, UA: NEGATIVE mg/dL
Hgb urine dipstick: NEGATIVE
KETONES UR: NEGATIVE mg/dL
NITRITE: NEGATIVE
PROTEIN: NEGATIVE mg/dL
Specific Gravity, Urine: 1.018 (ref 1.005–1.030)
pH: 6 (ref 5.0–8.0)

## 2018-01-22 LAB — RETICULOCYTES
Immature Retic Fract: 14.5 % (ref 2.3–15.9)
RBC.: 3.16 MIL/uL — AB (ref 3.87–5.11)
RETIC COUNT ABSOLUTE: 53.4 10*3/uL (ref 19.0–186.0)
RETIC CT PCT: 1.7 % (ref 0.4–3.1)

## 2018-01-22 LAB — IRON AND TIBC
Iron: 47 ug/dL (ref 28–170)
SATURATION RATIOS: 13 % (ref 10.4–31.8)
TIBC: 370 ug/dL (ref 250–450)
UIBC: 323 ug/dL

## 2018-01-22 LAB — SURGICAL PCR SCREEN
MRSA, PCR: NEGATIVE
Staphylococcus aureus: NEGATIVE

## 2018-01-22 LAB — FERRITIN: Ferritin: 7 ng/mL — ABNORMAL LOW (ref 11–307)

## 2018-01-22 LAB — ABO/RH: ABO/RH(D): A POS

## 2018-01-22 LAB — CBC
HEMATOCRIT: 28.3 % — AB (ref 36.0–46.0)
Hemoglobin: 8.8 g/dL — ABNORMAL LOW (ref 12.0–15.0)
MCH: 27.8 pg (ref 26.0–34.0)
MCHC: 31.1 g/dL (ref 30.0–36.0)
MCV: 89.6 fL (ref 80.0–100.0)
Platelets: 155 10*3/uL (ref 150–400)
RBC: 3.16 MIL/uL — ABNORMAL LOW (ref 3.87–5.11)
RDW: 14.2 % (ref 11.5–15.5)
WBC: 7.6 10*3/uL (ref 4.0–10.5)
nRBC: 0 % (ref 0.0–0.2)

## 2018-01-22 LAB — FOLATE: Folate: 11.5 ng/mL (ref >5.9)

## 2018-01-22 LAB — VITAMIN B12: VITAMIN B 12: 267 pg/mL (ref 180–914)

## 2018-01-22 LAB — ALBUMIN: ALBUMIN: 2.9 g/dL — AB (ref 3.5–5.0)

## 2018-01-22 SURGERY — FIXATION, FRACTURE, INTERTROCHANTERIC, WITH INTRAMEDULLARY ROD
Anesthesia: Spinal | Site: Leg Upper | Laterality: Left

## 2018-01-22 MED ORDER — MENTHOL 3 MG MT LOZG: 1.0000 | LOZENGE | OROMUCOSAL | Status: DC | PRN

## 2018-01-22 MED ORDER — DOCUSATE SODIUM 100 MG PO CAPS
100.0000 mg | ORAL_CAPSULE | Freq: Two times a day (BID) | ORAL | Status: DC
  Administered 2018-01-22 – 2018-01-25 (×6): 100 mg via ORAL
  Filled 2018-01-22 (×6): qty 1

## 2018-01-22 MED ORDER — PROPOFOL 500 MG/50ML IV EMUL
INTRAVENOUS | Status: DC | PRN
  Administered 2018-01-22: 15 ug/kg/min via INTRAVENOUS

## 2018-01-22 MED ORDER — PROPOFOL 500 MG/50ML IV EMUL
INTRAVENOUS | Status: AC
  Filled 2018-01-22: qty 50

## 2018-01-22 MED ORDER — SODIUM CHLORIDE 0.9 % IV SOLN
INTRAVENOUS | Status: DC | PRN
  Administered 2018-01-22: 20 ug/min via INTRAVENOUS

## 2018-01-22 MED ORDER — PHENOL 1.4 % MT LIQD: 1.0000 | OROMUCOSAL | Status: DC | PRN

## 2018-01-22 MED ORDER — METHOCARBAMOL 1000 MG/10ML IJ SOLN
500.0000 mg | Freq: Four times a day (QID) | INTRAVENOUS | Status: DC | PRN
  Filled 2018-01-22: qty 5

## 2018-01-22 MED ORDER — PROPOFOL 10 MG/ML IV BOLUS
INTRAVENOUS | Status: AC
  Filled 2018-01-22: qty 20

## 2018-01-22 MED ORDER — DEXAMETHASONE SODIUM PHOSPHATE 10 MG/ML IJ SOLN
INTRAMUSCULAR | Status: DC | PRN
  Administered 2018-01-22: 5 mg via INTRAVENOUS

## 2018-01-22 MED ORDER — LACTATED RINGERS IV SOLN
INTRAVENOUS | Status: DC
  Administered 2018-01-22: 08:00:00 via INTRAVENOUS

## 2018-01-22 MED ORDER — ACETAMINOPHEN 325 MG PO TABS
325.0000 mg | ORAL_TABLET | Freq: Four times a day (QID) | ORAL | Status: DC | PRN
  Administered 2018-01-23: 650 mg via ORAL
  Filled 2018-01-22: qty 2

## 2018-01-22 MED ORDER — CEFAZOLIN SODIUM-DEXTROSE 2-4 GM/100ML-% IV SOLN
INTRAVENOUS | Status: AC
  Administered 2018-01-22: 12:00:00
  Filled 2018-01-22: qty 100

## 2018-01-22 MED ORDER — MORPHINE SULFATE (PF) 2 MG/ML IV SOLN
0.5000 mg | INTRAVENOUS | Status: DC | PRN
  Administered 2018-01-22: 0.5 mg via INTRAVENOUS
  Filled 2018-01-22: qty 1

## 2018-01-22 MED ORDER — METHOCARBAMOL 500 MG PO TABS
500.0000 mg | ORAL_TABLET | Freq: Four times a day (QID) | ORAL | Status: DC | PRN
  Administered 2018-01-22: 500 mg via ORAL
  Filled 2018-01-22: qty 1

## 2018-01-22 MED ORDER — 0.9 % SODIUM CHLORIDE (POUR BTL) OPTIME
TOPICAL | Status: DC | PRN
  Administered 2018-01-22: 1000 mL

## 2018-01-22 MED ORDER — HYDROCODONE-ACETAMINOPHEN 5-325 MG PO TABS
1.0000 | ORAL_TABLET | ORAL | Status: DC | PRN
  Administered 2018-01-22: 1 via ORAL
  Administered 2018-01-23 – 2018-01-24 (×4): 2 via ORAL
  Filled 2018-01-22 (×4): qty 2
  Filled 2018-01-22: qty 1

## 2018-01-22 MED ORDER — CEFAZOLIN SODIUM-DEXTROSE 2-4 GM/100ML-% IV SOLN
2.0000 g | Freq: Four times a day (QID) | INTRAVENOUS | Status: AC
  Administered 2018-01-22 (×2): 2 g via INTRAVENOUS
  Filled 2018-01-22 (×3): qty 100

## 2018-01-22 MED ORDER — METOCLOPRAMIDE HCL 5 MG/ML IJ SOLN: 5.0000 mg | Freq: Three times a day (TID) | INTRAMUSCULAR | Status: DC | PRN

## 2018-01-22 MED ORDER — KETAMINE HCL 10 MG/ML IJ SOLN
INTRAMUSCULAR | Status: DC | PRN
  Administered 2018-01-22: 20 mg via INTRAVENOUS

## 2018-01-22 MED ORDER — ONDANSETRON HCL 4 MG/2ML IJ SOLN
4.0000 mg | Freq: Four times a day (QID) | INTRAMUSCULAR | Status: DC | PRN
  Filled 2018-01-22: qty 2

## 2018-01-22 MED ORDER — HYDROCODONE-ACETAMINOPHEN 7.5-325 MG PO TABS
1.0000 | ORAL_TABLET | ORAL | Status: DC | PRN
  Administered 2018-01-22 – 2018-01-23 (×3): 2 via ORAL
  Filled 2018-01-22 (×3): qty 2

## 2018-01-22 MED ORDER — ONDANSETRON HCL 4 MG/2ML IJ SOLN
INTRAMUSCULAR | Status: AC
  Filled 2018-01-22: qty 2

## 2018-01-22 MED ORDER — ONDANSETRON HCL 4 MG PO TABS: 4.0000 mg | ORAL_TABLET | Freq: Four times a day (QID) | ORAL | Status: DC | PRN

## 2018-01-22 MED ORDER — ONDANSETRON HCL 4 MG/2ML IJ SOLN: 4.0000 mg | Freq: Once | INTRAMUSCULAR | Status: DC | PRN

## 2018-01-22 MED ORDER — DEXAMETHASONE SODIUM PHOSPHATE 10 MG/ML IJ SOLN
INTRAMUSCULAR | Status: AC
  Filled 2018-01-22: qty 1

## 2018-01-22 MED ORDER — PROPOFOL 10 MG/ML IV BOLUS
INTRAVENOUS | Status: DC | PRN
  Administered 2018-01-22: 20 mg via INTRAVENOUS

## 2018-01-22 MED ORDER — CEFAZOLIN SODIUM-DEXTROSE 2-3 GM-%(50ML) IV SOLR
INTRAVENOUS | Status: DC | PRN
  Administered 2018-01-22: 2 g via INTRAVENOUS

## 2018-01-22 MED ORDER — SODIUM CHLORIDE 0.9 % IV SOLN: INTRAVENOUS | Status: DC | PRN

## 2018-01-22 MED ORDER — ASPIRIN EC 325 MG PO TBEC
325.0000 mg | DELAYED_RELEASE_TABLET | Freq: Every day | ORAL | Status: DC
  Administered 2018-01-23 – 2018-01-24 (×2): 325 mg via ORAL
  Filled 2018-01-22 (×2): qty 1

## 2018-01-22 MED ORDER — ALUM & MAG HYDROXIDE-SIMETH 200-200-20 MG/5ML PO SUSP
30.0000 mL | ORAL | Status: DC | PRN
  Administered 2018-01-22 – 2018-01-24 (×7): 30 mL via ORAL
  Filled 2018-01-22 (×7): qty 30

## 2018-01-22 MED ORDER — KETAMINE HCL 50 MG/5ML IJ SOSY
PREFILLED_SYRINGE | INTRAMUSCULAR | Status: AC
  Filled 2018-01-22: qty 5

## 2018-01-22 MED ORDER — METOCLOPRAMIDE HCL 5 MG PO TABS: 5.0000 mg | ORAL_TABLET | Freq: Three times a day (TID) | ORAL | Status: DC | PRN

## 2018-01-22 MED ORDER — FENTANYL CITRATE (PF) 100 MCG/2ML IJ SOLN: 25.0000 ug | INTRAMUSCULAR | Status: DC | PRN

## 2018-01-22 MED ORDER — BUPIVACAINE IN DEXTROSE 0.75-8.25 % IT SOLN
INTRATHECAL | Status: DC | PRN
  Administered 2018-01-22: 1.4 mL via INTRATHECAL

## 2018-01-22 MED ORDER — ONDANSETRON HCL 4 MG/2ML IJ SOLN
INTRAMUSCULAR | Status: DC | PRN
  Administered 2018-01-22: 4 mg via INTRAVENOUS

## 2018-01-22 MED ORDER — FENTANYL CITRATE (PF) 250 MCG/5ML IJ SOLN
INTRAMUSCULAR | Status: AC
  Filled 2018-01-22: qty 5

## 2018-01-22 SURGICAL SUPPLY — 47 items
BLADE SURG 15 STRL LF DISP TIS (BLADE) ×1 IMPLANT
BLADE SURG 15 STRL SS (BLADE) ×3
BNDG GAUZE ELAST 4 BULKY (GAUZE/BANDAGES/DRESSINGS) ×5 IMPLANT
COVER PERINEAL POST (MISCELLANEOUS) ×3 IMPLANT
COVER SURGICAL LIGHT HANDLE (MISCELLANEOUS) ×3 IMPLANT
COVER WAND RF STERILE (DRAPES) ×3 IMPLANT
DRAPE STERI IOBAN 125X83 (DRAPES) ×3 IMPLANT
DRSG AQUACEL AG ADV 3.5X10 (GAUZE/BANDAGES/DRESSINGS) ×2 IMPLANT
DRSG MEPILEX BORDER 4X4 (GAUZE/BANDAGES/DRESSINGS) IMPLANT
DRSG MEPILEX BORDER 4X8 (GAUZE/BANDAGES/DRESSINGS) ×3 IMPLANT
DRSG PAD ABDOMINAL 8X10 ST (GAUZE/BANDAGES/DRESSINGS) ×6 IMPLANT
DURAPREP 26ML APPLICATOR (WOUND CARE) ×3 IMPLANT
ELECT REM PT RETURN 9FT ADLT (ELECTROSURGICAL) ×3
ELECTRODE REM PT RTRN 9FT ADLT (ELECTROSURGICAL) ×1 IMPLANT
FACESHIELD WRAPAROUND (MASK) ×3 IMPLANT
FACESHIELD WRAPAROUND OR TEAM (MASK) ×1 IMPLANT
GAUZE XEROFORM 5X9 LF (GAUZE/BANDAGES/DRESSINGS) ×3 IMPLANT
GLOVE BIO SURGEON STRL SZ8 (GLOVE) ×3 IMPLANT
GLOVE BIOGEL PI IND STRL 8 (GLOVE) ×1 IMPLANT
GLOVE BIOGEL PI INDICATOR 8 (GLOVE) ×2
GLOVE ORTHO TXT STRL SZ7.5 (GLOVE) ×3 IMPLANT
GOWN STRL REUS W/ TWL LRG LVL3 (GOWN DISPOSABLE) ×1 IMPLANT
GOWN STRL REUS W/ TWL XL LVL3 (GOWN DISPOSABLE) ×2 IMPLANT
GOWN STRL REUS W/TWL LRG LVL3 (GOWN DISPOSABLE) ×3
GOWN STRL REUS W/TWL XL LVL3 (GOWN DISPOSABLE) ×6
GUIDE PIN 3.2X343 (PIN) ×2
GUIDE PIN 3.2X343MM (PIN) ×6
KIT BASIN OR (CUSTOM PROCEDURE TRAY) ×3 IMPLANT
KIT TURNOVER KIT B (KITS) ×3 IMPLANT
LINER BOOT UNIVERSAL DISP (MISCELLANEOUS) ×3 IMPLANT
MANIFOLD NEPTUNE II (INSTRUMENTS) ×3 IMPLANT
NAIL LEFT 10X36 (Nail) ×2 IMPLANT
NS IRRIG 1000ML POUR BTL (IV SOLUTION) ×3 IMPLANT
PACK GENERAL/GYN (CUSTOM PROCEDURE TRAY) ×3 IMPLANT
PAD ARMBOARD 7.5X6 YLW CONV (MISCELLANEOUS) ×6 IMPLANT
PAD CAST 4YDX4 CTTN HI CHSV (CAST SUPPLIES) ×2 IMPLANT
PADDING CAST COTTON 4X4 STRL (CAST SUPPLIES) ×6
PIN GUIDE 3.2X343MM (PIN) IMPLANT
SCREW LAG COMPR KIT 95/90 (Screw) ×2 IMPLANT
STAPLER VISISTAT 35W (STAPLE) ×3 IMPLANT
SUT VIC AB 0 CT1 27 (SUTURE) ×6
SUT VIC AB 0 CT1 27XBRD ANBCTR (SUTURE) ×2 IMPLANT
SUT VIC AB 2-0 CT1 27 (SUTURE) ×6
SUT VIC AB 2-0 CT1 TAPERPNT 27 (SUTURE) ×2 IMPLANT
TOWEL OR 17X24 6PK STRL BLUE (TOWEL DISPOSABLE) ×3 IMPLANT
TOWEL OR 17X26 10 PK STRL BLUE (TOWEL DISPOSABLE) ×3 IMPLANT
WATER STERILE IRR 1000ML POUR (IV SOLUTION) ×3 IMPLANT

## 2018-01-22 NOTE — Transfer of Care (Signed)
Immediate Anesthesia Transfer of Care Note  Patient: Brianna Barnes  Procedure(s) Performed: ORIF LEFT INTERTROCHANTRIC HIP FX (Left Leg Upper)  Patient Location: PACU  Anesthesia Type:Spinal and MAC combined with regional for post-op pain  Level of Consciousness: awake, alert , oriented and patient cooperative  Airway & Oxygen Therapy: Patient Spontanous Breathing  Post-op Assessment: Report given to RN, Post -op Vital signs reviewed and stable and Patient moving all extremities X 4  Post vital signs: Reviewed and stable  Last Vitals:  Vitals Value Taken Time  BP 98/51 01/22/2018 10:52 AM  Temp    Pulse 63 01/22/2018 10:54 AM  Resp 12 01/22/2018 10:54 AM  SpO2 97 % 01/22/2018 10:54 AM  Vitals shown include unvalidated device data.  Last Pain:  Vitals:   01/22/18 0702  TempSrc:   PainSc: 2          Complications: No apparent anesthesia complications

## 2018-01-22 NOTE — Brief Op Note (Signed)
01/22/2018  10:45 AM  PATIENT:  Brianna Barnes  84 y.o. female  PRE-OPERATIVE DIAGNOSIS:  LEFT INTERTROCHANTERIC HIP FRACTURE  POST-OPERATIVE DIAGNOSIS:  LEFT INTERTROCHANTERIC HIP FRACTURE  PROCEDURE:  Procedure(s): ORIF LEFT INTERTROCHANTRIC HIP FX (Left)  SURGEON:  Surgeon(s) and Role:    * Mcarthur Rossetti, MD - Primary  ANESTHESIA:   spinal  EBL:  25 mL   COUNTS:  YES  TOURNIQUET:  * No tourniquets in log *  DICTATION: .Other Dictation: Dictation Number [id number]  PLAN OF CARE: Admit to inpatient   PATIENT DISPOSITION:  PACU - hemodynamically stable.   Delay start of Pharmacological VTE agent (>24hrs) due to surgical blood loss or risk of bleeding: no

## 2018-01-22 NOTE — Op Note (Signed)
NAME: Brianna Barnes, Brianna Barnes MEDICAL RECORD [id number] ACCOUNT [id number] DATE OF BIRTH:[birth year] FACILITY: [facility] LOCATION: [facility] PHYSICIAN:Stephania Macfarlane [place], MD  OPERATIVE REPORT  DATE OF PROCEDURE:  01/22/2018  PREOPERATIVE DIAGNOSIS:  Left comminuted displaced intertrochanteric hip/proximal femur fracture.  POSTOPERATIVE DIAGNOSIS:  Left comminuted displaced intertrochanteric hip/proximal femur fracture.  PROCEDURE:  Open reduction internal fixation of left intertrochanteric hip fracture.  IMPLANTS:  [employer] InterTAN 10 mm x 360 x 36 cm femoral nail with a 95-90 mm lag compression screw construct.  SURGEON:  Lind Guest. [name], MD  ANESTHESIA:  Spinal.  ANTIBIOTICS:  Two grams IV Ancef.  ESTIMATED BLOOD LOSS:  Less  than 100 mL.  COMPLICATIONS:  None.  INDICATIONS:  The patient is a very pleasant 84 year old female who sustained an accidental mechanical fall yesterday.  She was seen at the [facility] emergency room and found to have a comminuted and displaced left hip intertrochanteric fracture.  She  was admitted initially to the medicine service and optimized for surgery today.  I had a long and thorough discussion with her and her family at the bedside.  They have elected for open reduction internal fixation of this fracture and I agree, given the  severe displacement of it and the deformity she has of her leg.  Also, her pain is severe and she is someone who did ambulate without any assistive device before this.  We talked about the risk of acute blood loss anemia, nerve or vessel injury, DVT and  implant failure.  We talked about our goals being decreased pain, improve mobility and overall improve quality of life.  DESCRIPTION OF PROCEDURE:  After informed consent was obtained,  the left hip was marked.  She was brought to the operating room and turned on her side so spinal anesthesia could be obtained.  Foley catheter was placed and then she was placed  supine on  the fracture table with her right leg in line skeletal traction.  The left hip flexed and adducted out of the field with a well leg holder and appropriate padding of the popliteal area.  The perineal post was placed as well.  We then assessed her  fracture under direct visualization and fluoroscopy and with traction and internal rotation, we were able to reduce the fracture.  We then chose our femoral nail, keeping it sterilely in the box, placing it over the leg to choose our 10 mm x 36 cm  femoral nail.  We then passed this off to the back table to be opened sterilely.  Her left hip was prepped and draped with DuraPrep and sterile drapes.  A time-out was called to identify correct patient, correct left hip.  We then made an incision just   proximal to the greater trochanter and dissected down to the greater trochanter.  I could feel the greater trochanter and used a guide pin under direct fluoroscopy to place this in an antegrade fashion for my initiating reamer.  I then overreamed this  area to open up the femoral canal.  We then placed the rod without difficulty down the leg without needing to ream.  Using the outrigger guide, we then made a separate guide pin placement through the lateral femur into a near center-center position in  the femoral head and neck.  We took a measurement off of this and chose our 95-90 lag/compression screw construct.  We drilled for these levels and then placed these without difficulty compressing the fracture seeing this under direct fluoroscopy as  well.  We then removed all instrumentation, irrigated the small wounds with normal saline solution.  I put the hip through internal and external rotation under direct fluoroscopy.  We were pleased with the fracture reduction and placement of the  hardware.    Once we closed the wounds with 0 Vicryl followed by 2-0 Vicryl in interrupted staples on the skin.  Xeroform well-padded sterile dressing was applied.  She was  taken off the fracture table and taken to recovery room in stable condition.    All final counts were correct.  There were no complications noted.  AN/NUANCE  D:01/22/2018 T:01/22/2018 JOB:[id number]

## 2018-01-22 NOTE — Progress Notes (Signed)
SLP Cancellation Note  Patient Details Name: Brianna Barnes MRN: [id number] DOB: Jun 05, 1928   Cancelled treatment:       Reason Eval/Treat Not Completed: Patient declined, no reason specified. Pt unable to tolerate head of bed elevation 2/2 pain. Will follow up next date.  Deneise Lever, [place], CCC-SLP Speech-Language Pathologist Acute Rehabilitation Services Pager: [phone] Office: [phone]    Aliene Altes 01/22/2018, 5:30 PM

## 2018-01-22 NOTE — Anesthesia Procedure Notes (Signed)
Spinal  Patient location during procedure: OR Start time: 01/22/2018 9:47 AM End time: 01/22/2018 9:52 AM Staffing Anesthesiologist: Catalina Gravel, MD Performed: anesthesiologist  Preanesthetic Checklist Completed: patient identified, surgical consent, pre-op evaluation, timeout performed, IV checked, risks and benefits discussed and monitors and equipment checked Spinal Block Patient position: left lateral decubitus Prep: site prepped and draped and DuraPrep Patient monitoring: continuous pulse ox and blood pressure Approach: midline Location: L3-4 Injection technique: single-shot Needle Needle type: Pencan  Needle gauge: 24 G Assessment Sensory level: T8 Additional Notes Functioning IV was confirmed and monitors were applied. Sterile prep and drape, including hand hygiene, mask and sterile gloves were used. The patient was positioned and the spine was prepped. The skin was anesthetized with lidocaine.  Free flow of clear CSF was obtained prior to injecting local anesthetic into the CSF.  The spinal needle aspirated freely following injection.  The needle was carefully withdrawn.  The patient tolerated the procedure well. Consent was obtained prior to procedure with all questions answered and concerns addressed. Risks including but not limited to bleeding, infection, nerve damage, paralysis, failed block, inadequate analgesia, allergic reaction, high spinal, itching and headache were discussed and the patient wished to proceed.   Hoy Morn, MD

## 2018-01-22 NOTE — Anesthesia Preprocedure Evaluation (Signed)
Anesthesia Evaluation  Patient identified by MRN, date of birth, ID band Patient awake    Reviewed: Allergy & Precautions, NPO status , Patient's Chart, lab work & pertinent test results  Airway Mallampati: II  TM Distance: >3 FB Neck ROM: Full    Dental  (+) Dental Advisory Given, Edentulous Lower, Edentulous Upper   Pulmonary neg pulmonary ROS,    Pulmonary exam normal breath sounds clear to auscultation       Cardiovascular hypertension, (-) CAD and (-) Past MI Normal cardiovascular exam Rhythm:Regular Rate:Normal     Neuro/Psych PSYCHIATRIC DISORDERS Anxiety negative neurological ROS     GI/Hepatic GERD  ,(+) Cirrhosis       , Hepatitis -  Endo/Other  negative endocrine ROS  Renal/GU negative Renal ROS     Musculoskeletal  (+) Arthritis ,   Abdominal   Peds  Hematology  (+) Blood dyscrasia, anemia , Plt 155k   Anesthesia Other Findings Day of surgery medications reviewed with the patient.  Reproductive/Obstetrics                             Anesthesia Physical Anesthesia Plan  ASA: III  Anesthesia Plan: Spinal   Post-op Pain Management:    Induction:   PONV Risk Score and Plan: 2 and Treatment may vary due to age or medical condition and Propofol infusion  Airway Management Planned: Natural Airway and Simple Face Mask  Additional Equipment:   Intra-op Plan:   Post-operative Plan:   Informed Consent: I have reviewed the patients History and Physical, chart, labs and discussed the procedure including the risks, benefits and alternatives for the proposed anesthesia with the patient or authorized representative who has indicated his/her understanding and acceptance.   Dental advisory given  Plan Discussed with: CRNA, Anesthesiologist and Surgeon  Anesthesia Plan Comments: (DNR-patient wishes to suspend in the perioperative period.)        Anesthesia Quick  Evaluation

## 2018-01-22 NOTE — Anesthesia Postprocedure Evaluation (Signed)
Anesthesia Post Note  Patient: Brianna Barnes  Procedure(s) Performed: ORIF LEFT INTERTROCHANTRIC HIP FX (Left Leg Upper)     Patient location during evaluation: PACU Anesthesia Type: Spinal Level of consciousness: oriented, awake and alert and awake Pain management: pain level controlled Vital Signs Assessment: post-procedure vital signs reviewed and stable Respiratory status: spontaneous breathing, respiratory function stable and nonlabored ventilation Cardiovascular status: blood pressure returned to baseline and stable Postop Assessment: no headache, no backache, no apparent nausea or vomiting, patient able to bend at knees and spinal receding Anesthetic complications: no    Last Vitals:  Vitals:   01/22/18 1201 01/22/18 1824  BP: (!) 116/55 136/64  Pulse: 77 89  Resp:    Temp: 36.6 C 37.7 C  SpO2:  94%    Last Pain:  Vitals:   01/22/18 1824  TempSrc: Oral  PainSc:                  Catalina Gravel

## 2018-01-22 NOTE — Consult Note (Signed)
Reason for Consult: Left hip fracture  Referring Physician: ED physician assistant  Brianna Barnes is an 84 y.o. female.  HPI: The patient is an 84 year old female who sustained an accidental mechanical fall yesterday injuring her left hip.  This was later in the evening.  She does report episodes of dizziness but denies any syncope.  She was transported to the [facility] emergency room where she was found to have a comminuted left hip trochanteric fracture.  She complains only of left hip pain.  Family is at the bedside.  She was graciously admitted to the medicine service for medical management and optimization prior to surgery.  Past Medical History:  Diagnosis Date  . Choledocholithiasis   . Cirrhosis, cryptogenic (HCC)    FOLLOWED BY DR PYRTLE-- LOV  IN EPIC--  STABLE PER NOTE  . Cryptogenic cirrhosis ([place])   . Esophageal varix ([place])   . GERD (gastroesophageal reflux disease)   . History of colonoscopy with polypectomy    ADENOMATOUS AND HYPERPLASIA POLYPS  (2008  &  2010)  . History of hepatitis C   . History of positive PPD    + TB SKIN TEST WITHOUT TB  . Hyperlipidemia   . Hypertension, portal ([place])    SECONDARY TO CRYPTOGENIC CIRRHOSIS  . Internal hemorrhoids   . Iron deficiency anemia   . Loss of hearing    right ear  . Osteoarthritis    hands, knees, and low back  . Osteoporosis   . PUD (peptic ulcer disease)   . Tubular adenoma of colon   . Uterovaginal prolapse, incomplete   . Wears glasses   . Wears hearing aid    left ear only    Past Surgical History:  Procedure Laterality Date  . APPENDECTOMY  1960'S  . CHOLECYSTECTOMY  08/25/2011   Procedure: LAPAROSCOPIC CHOLECYSTECTOMY WITH INTRAOPERATIVE CHOLANGIOGRAM;  Surgeon: Zenovia Jarred, MD;  Location: [facility];  Service: General;  Laterality: N/A;  . ENDOSCOPIC RETROGRADE CHOLANGIOPANCREATOGRAPHY (ERCP) WITH PROPOFOL N/A 11/30/2012   Procedure: ENDOSCOPIC RETROGRADE CHOLANGIOPANCREATOGRAPHY (ERCP) WITH PROPOFOL;   Surgeon: Milus Banister, MD;  Location: [facility];  Service: Endoscopy;  Laterality: N/A;  . EXTERNAL EAR SURGERY Left 1980  . HYSTEROSCOPY W/D&C N/A 07/15/2014   Procedure: DILATATION AND CURETTAGE /HYSTEROSCOPY;  Surgeon: Molli Posey, MD;  Location: [facility];  Service: Gynecology;  Laterality: N/A;  . KNEE ARTHROSCOPY W/ MENISCECTOMY Right 06-19-2002  . [place] SURGERY  1990  . REMOVAL OF URINARY SLING N/A 09/13/2013   Procedure: REMOVAL OF RETAINED PESSARY;  Surgeon: Jorja Loa, MD;  Location: [facility];  Service: Urology;  Laterality: N/A;    Family History  Problem Relation Age of Onset  . Heart disease Mother   . Heart disease Father 47  . Hyperlipidemia Father   . Diabetes Father   . Cirrhosis Sister   . Diabetes Brother   . Heart disease Brother     Social History:  reports that she has never smoked. She has never used smokeless tobacco. She reports that she does not drink alcohol or use drugs.  Allergies:  Allergies  Allergen Reactions  . Asa [Aspirin] Nausea Only and Other (See Comments)    Stomach is a bit sensitive    Medications: I have reviewed the patient's current medications.  Results for orders placed or performed during the hospital encounter of 01/21/18 (from the past 48 hour(s))  Basic metabolic panel     Status: Abnormal  Collection Time: 01/21/18  8:47 PM  Result Value Ref Range   Sodium 140 135 - 145 mmol/L   Potassium 4.1 3.5 - 5.1 mmol/L   Chloride 109 98 - 111 mmol/L   CO2 26 22 - 32 mmol/L   Glucose, Bld 138 (H) 70 - 99 mg/dL   BUN 11 8 - 23 mg/dL   Creatinine, Ser 0.73 0.44 - 1.00 mg/dL   Calcium 8.2 (L) 8.9 - 10.3 mg/dL   GFR calc non Af Amer >60 >60 mL/min    Comment: CORRECTED ON 11/09 AT 2158: PREVIOUSLY REPORTED AS NOT CALCULATED   GFR calc Af Amer >60 >60 mL/min    Comment: (NOTE) The eGFR has been calculated using the CKD EPI equation. This calculation has not been validated in  all clinical situations. eGFR's persistently <60 mL/min signify possible Chronic Kidney Disease. CORRECTED ON 11/09 AT 2158: PREVIOUSLY REPORTED AS NOT CALCULATED    Anion gap 5 5 - 15    Comment: Performed at [facility], [place] [address], [place], [place] [address]  CBC with Differential     Status: Abnormal   Collection Time: 01/21/18  8:47 PM  Result Value Ref Range   WBC 6.7 4.0 - 10.5 K/uL   RBC 3.71 (L) 3.87 - 5.11 MIL/uL   Hemoglobin 10.5 (L) 12.0 - 15.0 g/dL   HCT 34.1 (L) 36.0 - 46.0 %   MCV 91.9 80.0 - 100.0 fL   MCH 28.3 26.0 - 34.0 pg   MCHC 30.8 30.0 - 36.0 g/dL   RDW 14.4 11.5 - 15.5 %   Platelets 192 150 - 400 K/uL   nRBC 0.0 0.0 - 0.2 %   Neutrophils Relative % 73 %   Neutro Abs 5.0 1.7 - 7.7 K/uL   Lymphocytes Relative 13 %   Lymphs Abs 0.9 0.7 - 4.0 K/uL   Monocytes Relative 9 %   Monocytes Absolute 0.6 0.1 - 1.0 K/uL   Eosinophils Relative 4 %   Eosinophils Absolute 0.3 0.0 - 0.5 K/uL   Basophils Relative 1 %   Basophils Absolute 0.0 0.0 - 0.1 K/uL   Immature Granulocytes 0 %   Abs Immature Granulocytes 0.02 0.00 - 0.07 K/uL    Comment: Performed at [facility] [address] [place], [place] [address]  Type and screen [place]     Status: None   Collection Time: 01/21/18 10:01 PM  Result Value Ref Range   ABO/RH(D) A POS    Antibody Screen NEG    Sample Expiration      01/24/2018 Performed at [facility], [place] [address], [place], [place] [address]   Protime-INR     Status: None   Collection Time: 01/21/18 10:01 PM  Result Value Ref Range   Prothrombin Time 14.6 11.4 - 15.2 seconds   INR 1.15     Comment: Performed at [facility] [address] [place], [place] [address]  ABO/Rh     Status: None (Preliminary result)   Collection Time: 01/21/18 10:01 PM  Result Value Ref Range   ABO/RH(D)      A POS Performed at [facility] [address] [place], [place] [address]   Hepatic function  panel     Status: Abnormal   Collection Time: 01/21/18 10:39 PM  Result Value Ref Range   Total Protein 5.4 (L) 6.5 - 8.1 g/dL   Albumin 3.3 (L) 3.5 - 5.0 g/dL  AST 47 (H) 15 - 41 U/L   ALT 25 0 - 44 U/L   Alkaline Phosphatase 73 38 - 126 U/L   Total Bilirubin 0.8 0.3 - 1.2 mg/dL   Bilirubin, Direct 0.3 (H) 0.0 - 0.2 mg/dL   Indirect Bilirubin 0.5 0.3 - 0.9 mg/dL    Comment: Performed at [facility] [address] [place], [place] [address]  Surgical pcr screen     Status: None   Collection Time: 01/22/18 12:24 AM  Result Value Ref Range   MRSA, PCR NEGATIVE NEGATIVE   Staphylococcus aureus NEGATIVE NEGATIVE    Comment: (NOTE) The Xpert SA Assay (FDA approved for NASAL specimens in patients 38 years of age and older), is one component of a comprehensive surveillance program. It is not intended to diagnose infection nor to guide or monitor treatment. Performed at [facility], [place] [address], [place], [place] [address]   Urinalysis, Routine w reflex microscopic     Status: Abnormal   Collection Time: 01/22/18  1:00 AM  Result Value Ref Range   Color, Urine YELLOW YELLOW   APPearance CLEAR CLEAR   Specific Gravity, Urine 1.018 1.005 - 1.030   pH 6.0 5.0 - 8.0   Glucose, UA NEGATIVE NEGATIVE mg/dL   Hgb urine dipstick NEGATIVE NEGATIVE   Bilirubin Urine NEGATIVE NEGATIVE   Ketones, ur NEGATIVE NEGATIVE mg/dL   Protein, ur NEGATIVE NEGATIVE mg/dL   Nitrite NEGATIVE NEGATIVE   Leukocytes, UA SMALL (A) NEGATIVE   RBC / HPF 0-5 0 - 5 RBC/hpf   WBC, UA 11-20 0 - 5 WBC/hpf   Bacteria, UA NONE SEEN NONE SEEN   Squamous Epithelial / LPF 0-5 0 - 5   Mucus PRESENT     Comment: Performed at [facility] [address], [place], [place] [address]  CBC     Status: Abnormal   Collection Time: 01/22/18  3:47 AM  Result Value Ref Range   WBC 7.6 4.0 - 10.5 K/uL   RBC 3.16 (L) 3.87 - 5.11 MIL/uL   Hemoglobin 8.8 (L) 12.0 - 15.0 g/dL   HCT 28.3 (L) 36.0 - 46.0 %    MCV 89.6 80.0 - 100.0 fL   MCH 27.8 26.0 - 34.0 pg   MCHC 31.1 30.0 - 36.0 g/dL   RDW 14.2 11.5 - 15.5 %   Platelets 155 150 - 400 K/uL   nRBC 0.0 0.0 - 0.2 %    Comment: Performed at [facility], [place] [address] [place], [place] [address]  Basic metabolic panel     Status: Abnormal   Collection Time: 01/22/18  3:47 AM  Result Value Ref Range   Sodium 140 135 - 145 mmol/L   Potassium 3.8 3.5 - 5.1 mmol/L   Chloride 110 98 - 111 mmol/L   CO2 25 22 - 32 mmol/L   Glucose, Bld 133 (H) 70 - 99 mg/dL   BUN 12 8 - 23 mg/dL   Creatinine, Ser 0.59 0.44 - 1.00 mg/dL   Calcium 8.1 (L) 8.9 - 10.3 mg/dL   GFR calc non Af Amer >60 >60 mL/min   GFR calc Af Amer >60 >60 mL/min    Comment: (NOTE) The eGFR has been calculated using the CKD EPI equation. This calculation has not been validated in all clinical situations. eGFR's persistently <60 mL/min signify possible Chronic Kidney Disease.    Anion gap 5 5 - 15    Comment: Performed at [facility], [place] [address] [place], [place] [address]  Albumin     Status: Abnormal   Collection Time: 01/22/18  3:47 AM  Result Value Ref Range   Albumin 2.9 (L) 3.5 - 5.0 g/dL    Comment: Performed at [facility], [place] [address] [place], [place] [address]  Vitamin B12     Status: None   Collection Time: 01/22/18  3:47 AM  Result Value Ref Range   Vitamin B-12 267 180 - 914 pg/mL    Comment: (NOTE) This assay is not validated for testing neonatal or myeloproliferative syndrome specimens for Vitamin B12 levels. Performed at [facility], [place] [address] [place], [place] [address]   Folate     Status: None   Collection Time: 01/22/18  3:47 AM  Result Value Ref Range   Folate 11.5 >5.9 ng/mL    Comment: Performed at [facility] [address], [place], [place] [address]  Iron and TIBC     Status: None   Collection Time: 01/22/18  3:47 AM  Result Value Ref Range   Iron 47 28 - 170 ug/dL   TIBC 370 250 - 450 ug/dL    Saturation Ratios 13 10.4 - 31.8 %   UIBC 323 ug/dL    Comment: Performed at [facility], [place] [address] [place], [place] [address]  Ferritin     Status: Abnormal   Collection Time: 01/22/18  3:47 AM  Result Value Ref Range   Ferritin 7 (L) 11 - 307 ng/mL    Comment: Performed at [facility], [place] [address] [place], [place] [address]  Reticulocytes     Status: Abnormal   Collection Time: 01/22/18  3:47 AM  Result Value Ref Range   Retic Ct Pct 1.7 0.4 - 3.1 %   RBC. 3.16 (L) 3.87 - 5.11 MIL/uL   Retic Count, Absolute 53.4 19.0 - 186.0 K/uL   Immature Retic Fract 14.5 2.3 - 15.9 %    Comment: Performed at [facility] [address] [place], [place] [address]    Dg Chest 1 View  Result Date: 01/21/2018 CLINICAL DATA:  Left hip pain after fall. EXAM: CHEST  1 VIEW COMPARISON:  Radiographs of August 24, 2011. FINDINGS: The heart size and mediastinal contours are within normal limits. Atherosclerosis of thoracic aorta is noted. Both lungs are clear. The visualized skeletal structures are unremarkable. IMPRESSION: No acute cardiopulmonary abnormality seen. Aortic Atherosclerosis (ICD10-I70.0). Electronically Signed   By: Marijo Conception, M.D.   On: 01/21/2018 21:34   Ct Head Wo Contrast  Result Date: 01/21/2018 CLINICAL DATA:  Status post fall onto left side. Concern for head or cervical spine injury. Initial encounter. EXAM: CT HEAD WITHOUT CONTRAST CT CERVICAL SPINE WITHOUT CONTRAST TECHNIQUE: Multidetector CT imaging of the head and cervical spine was performed following the standard protocol without intravenous contrast. Multiplanar CT image reconstructions of the cervical spine were also generated. COMPARISON:  Cervical spine radiograph performed 11/04/2017 FINDINGS: CT HEAD FINDINGS Brain: No evidence of acute infarction, hemorrhage, hydrocephalus, extra-axial collection or mass lesion / mass effect. Prominence of the ventricles and sulci reflects mild cortical  volume loss. Scattered periventricular and subcortical white matter change likely reflects small vessel ischemic microangiopathy. Mild cerebellar atrophy is noted. The brainstem and fourth ventricle are within normal limits. The basal ganglia are unremarkable in appearance. The cerebral hemispheres demonstrate grossly normal gray-white differentiation. No mass effect or midline shift is seen.  Vascular: No hyperdense vessel or unexpected calcification. Skull: There is no evidence of fracture; visualized osseous structures are unremarkable in appearance. Sinuses/Orbits: The orbits are within normal limits. There is opacification and mucoperiosteal thickening at the left side of the sphenoid sinus. The remaining paranasal sinuses and mastoid air cells are well-aerated. Other: No significant soft tissue abnormalities are seen. CT CERVICAL SPINE FINDINGS Alignment: Normal. Skull base and vertebrae: No acute fracture. No primary bone lesion or focal pathologic process. Soft tissues and spinal canal: No prevertebral fluid or swelling. No visible canal hematoma. Disc levels: There is mild intervertebral disc space narrowing at C4-C5. Intervertebral disc spaces are grossly preserved. Upper chest: The visualized lung apices are clear. The thyroid gland is unremarkable. Calcification is seen at the carotid bifurcations bilaterally. Other: No additional soft tissue abnormalities are seen. IMPRESSION: 1. No evidence of traumatic intracranial injury or fracture. 2. No evidence of fracture or subluxation along the cervical spine. 3. Mild cortical volume loss and scattered small vessel ischemic microangiopathy. 4. Opacification and mucoperiosteal thickening at the left side of the sphenoid sinus. 5. Calcification at the carotid bifurcations bilaterally. Carotid ultrasound would be helpful for further evaluation, when and as deemed clinically appropriate. Electronically Signed   By: Garald Balding M.D.   On: 01/21/2018 21:51   Ct  Cervical Spine Wo Contrast  Result Date: 01/21/2018 CLINICAL DATA:  Status post fall onto left side. Concern for head or cervical spine injury. Initial encounter. EXAM: CT HEAD WITHOUT CONTRAST CT CERVICAL SPINE WITHOUT CONTRAST TECHNIQUE: Multidetector CT imaging of the head and cervical spine was performed following the standard protocol without intravenous contrast. Multiplanar CT image reconstructions of the cervical spine were also generated. COMPARISON:  Cervical spine radiograph performed 11/04/2017 FINDINGS: CT HEAD FINDINGS Brain: No evidence of acute infarction, hemorrhage, hydrocephalus, extra-axial collection or mass lesion / mass effect. Prominence of the ventricles and sulci reflects mild cortical volume loss. Scattered periventricular and subcortical white matter change likely reflects small vessel ischemic microangiopathy. Mild cerebellar atrophy is noted. The brainstem and fourth ventricle are within normal limits. The basal ganglia are unremarkable in appearance. The cerebral hemispheres demonstrate grossly normal gray-white differentiation. No mass effect or midline shift is seen. Vascular: No hyperdense vessel or unexpected calcification. Skull: There is no evidence of fracture; visualized osseous structures are unremarkable in appearance. Sinuses/Orbits: The orbits are within normal limits. There is opacification and mucoperiosteal thickening at the left side of the sphenoid sinus. The remaining paranasal sinuses and mastoid air cells are well-aerated. Other: No significant soft tissue abnormalities are seen. CT CERVICAL SPINE FINDINGS Alignment: Normal. Skull base and vertebrae: No acute fracture. No primary bone lesion or focal pathologic process. Soft tissues and spinal canal: No prevertebral fluid or swelling. No visible canal hematoma. Disc levels: There is mild intervertebral disc space narrowing at C4-C5. Intervertebral disc spaces are grossly preserved. Upper chest: The visualized lung  apices are clear. The thyroid gland is unremarkable. Calcification is seen at the carotid bifurcations bilaterally. Other: No additional soft tissue abnormalities are seen. IMPRESSION: 1. No evidence of traumatic intracranial injury or fracture. 2. No evidence of fracture or subluxation along the cervical spine. 3. Mild cortical volume loss and scattered small vessel ischemic microangiopathy. 4. Opacification and mucoperiosteal thickening at the left side of the sphenoid sinus. 5. Calcification at the carotid bifurcations bilaterally. Carotid ultrasound would be helpful for further evaluation, when and as deemed clinically appropriate. Electronically Signed   By: Garald Balding M.D.   On: 01/21/2018  21:51   Dg Hip Unilat W Or Wo Pelvis 2-3 Views Left  Result Date: 01/21/2018 CLINICAL DATA:  Left hip pain after fall today. EXAM: DG HIP (WITH OR WITHOUT PELVIS) 2-3V LEFT COMPARISON:  None. FINDINGS: Moderately displaced and comminuted fracture is seen involving the intertrochanteric portion of the proximal left femur. No other abnormality is noted. IMPRESSION: Moderately displaced and comminuted intertrochanteric fracture of proximal left femur. Electronically Signed   By: Marijo Conception, M.D.   On: 01/21/2018 21:33    Review of Systems  Neurological: Positive for dizziness.  All other systems reviewed and are negative.  Blood pressure 134/77, pulse 76, temperature 98.2 F (36.8 C), temperature source Oral, resp. rate 15, height '5\' 1"'$  (1.549 m), weight 49.2 kg, SpO2 95 %. Physical Exam  Constitutional: She appears well-developed and well-nourished.  HENT:  Head: Normocephalic and atraumatic.  Eyes: Pupils are equal, round, and reactive to light.  Neck: Normal range of motion.  Cardiovascular: Normal rate.  Respiratory: Effort normal.  GI: Soft.  Musculoskeletal:       Left hip: She exhibits decreased range of motion, decreased strength, tenderness, bony tenderness and deformity.  Neurological:  She is alert.  Skin: Skin is warm and dry.  Psychiatric: She has a normal mood and affect.    Assessment/Plan: Left hip with comminuted, displaced intertrochanteric proximal femur fracture  I have talked to the patient and her family at the bedside.  She has a significant amount of pain with her broken hip.  Prior to falling she does ambulate without assistive device although she has had some dizziness.  She is moderate risk for any type of surgery given her advanced age and multiple medical issues, we are recommending surgery and the patient and family agree with this.  The goal would be to decrease her pain to hopefully improve her mobility and quality of life.  We had a long thorough discussion about the surgery and I went over x-rays with them.  We talked about her intraoperative and postoperative course as well.  All questions concerns were answered and addressed.  Mcarthur Rossetti 01/22/2018, 7:58 AM

## 2018-01-22 NOTE — Progress Notes (Signed)
PROGRESS NOTE    Brianna Barnes  SVX:[id number] DOB: June 14, 1928 DOA: 01/21/2018 PCP: Jinny Sanders, MD  Outpatient Specialists:    Brief Narrative:  Patient is an 84 year old female, with past medical history significant for cirrhosis, hypertension, and Ambien vitamin D deficiency.  Patient was admitted with left hip fracture following a fall at home.  Orthopedic input is appreciated, patient is status post ORIF of the left hip.   Assessment & Plan:   Principal Problem:   Closed fracture of femur, intertrochanteric, left, initial encounter ([place]) Active Problems:   Vitamin D deficiency   Hyperlipidemia   Iron deficiency anemia   Essential hypertension, benign   GERD   Osteoporosis   Cryptogenic cirrhosis (HCC)   Dizzinesses   Carotid artery calcification, bilateral   S/p left hip fracture   Closed fracture of left femur, intertrochanteric: Status post ORIF. Orthopedic input is appreciated. Postop management as directed by the orthopedic team.  Vitamin D deficiency:   Follow vitamin D level.  Hyperlipidemia: Stable.   Restart home medication.   Iron deficiency anemia: Anemia panel  Monitor H/H  Transfuse packed red blood cells as needed.    Essential hypertension: Monitor and optimize.  GERD: Stable.  Osteoporosis: Patient is on Fosamax at baseline.  Cryptogenic cirrhosis: Followed by GI.  Currently stable.  DVT prophylaxis:  SCD      Code Status:    DNR/DNI    Family Communication:    Disposition Plan:   This will depend on hospital course.    Consultants:   Orthopedics.  Procedures:   ORIF.  Antimicrobials:   None   Subjective: Patient seen.   No new complaints.   No pain.   No fever chills.   No chest pain.   Shortness of breath.    Objective: Vitals:   01/21/18 2245 01/21/18 2300 01/21/18 2347 01/22/18 0353  BP: 122/60 (!) 112/56 140/79 134/77  Pulse: 82 78 72 76  Resp: 16 12 15 15   Temp:   98.4 F (36.9 C) 98.2 F  (36.8 C)  TempSrc:   Oral Oral  SpO2: 95% 99% 97% 95%  Weight:      Height:        Intake/Output Summary (Last 24 hours) at 01/22/2018 1032 Last data filed at 01/22/2018 1031 Gross per 24 hour  Intake 300 ml  Output -  Net 300 ml   Filed Weights   01/21/18 2041  Weight: 49.2 kg    Examination:  General exam: Appears calm and comfortable  Respiratory system: Clear to auscultation. Respiratory effort normal. Cardiovascular system: S1 & S2 heard.  No pedal edema. Gastrointestinal system: Abdomen is nondistended, soft and nontender. No organomegaly or masses felt. Normal bowel sounds heard. Central nervous system: Alert and oriented. No focal neurological deficits. Extremities: No leg edema.    Data Reviewed: I have personally reviewed following labs and imaging studies  CBC: Recent Labs  Lab 01/21/18 2047 01/22/18 0347  WBC 6.7 7.6  NEUTROABS 5.0  --   HGB 10.5* 8.8*  HCT 34.1* 28.3*  MCV 91.9 89.6  PLT 192 233   Basic Metabolic Panel: Recent Labs  Lab 01/21/18 2047 01/22/18 0347  NA 140 140  K 4.1 3.8  CL 109 110  CO2 26 25  GLUCOSE 138* 133*  BUN 11 12  CREATININE 0.73 0.59  CALCIUM 8.2* 8.1*   GFR: Estimated Creatinine Clearance: 37.4 mL/min (by C-G formula based on SCr of 0.59 mg/dL). Liver Function Tests: Recent Labs  Lab 01/21/18 2239 01/22/18 0347  AST 47*  --   ALT 25  --   ALKPHOS 73  --   BILITOT 0.8  --   PROT 5.4*  --   ALBUMIN 3.3* 2.9*   No results for input(s): LIPASE, AMYLASE in the last 168 hours. No results for input(s): AMMONIA in the last 168 hours. Coagulation Profile: Recent Labs  Lab 01/21/18 2201  INR 1.15   Cardiac Enzymes: No results for input(s): CKTOTAL, CKMB, CKMBINDEX, TROPONINI in the last 168 hours. BNP (last 3 results) No results for input(s): PROBNP in the last 8760 hours. HbA1C: No results for input(s): HGBA1C in the last 72 hours. CBG: No results for input(s): GLUCAP in the last 168 hours. Lipid  Profile: No results for input(s): CHOL, HDL, LDLCALC, TRIG, CHOLHDL, LDLDIRECT in the last 72 hours. Thyroid Function Tests: No results for input(s): TSH, T4TOTAL, FREET4, T3FREE, THYROIDAB in the last 72 hours. Anemia Panel: Recent Labs    01/22/18 0347  VITAMINB12 267  FOLATE 11.5  FERRITIN 7*  TIBC 370  IRON 47  RETICCTPCT 1.7   Urine analysis:    Component Value Date/Time   COLORURINE YELLOW 01/22/2018 0100   APPEARANCEUR CLEAR 01/22/2018 0100   LABSPEC 1.018 01/22/2018 0100   PHURINE 6.0 01/22/2018 0100   GLUCOSEU NEGATIVE 01/22/2018 0100   HGBUR NEGATIVE 01/22/2018 0100   BILIRUBINUR NEGATIVE 01/22/2018 0100   BILIRUBINUR Negative 11/04/2017 1223   KETONESUR NEGATIVE 01/22/2018 0100   PROTEINUR NEGATIVE 01/22/2018 0100   UROBILINOGEN 0.2 11/04/2017 1223   UROBILINOGEN 0.2 05/01/2011 1347   NITRITE NEGATIVE 01/22/2018 0100   LEUKOCYTESUR SMALL (A) 01/22/2018 0100   Sepsis Labs: @LABRCNTIP (procalcitonin:4,lacticidven:4)  ) Recent Results (from the past 240 hour(s))  Surgical pcr screen     Status: None   Collection Time: 01/22/18 12:24 AM  Result Value Ref Range Status   MRSA, PCR NEGATIVE NEGATIVE Final   Staphylococcus aureus NEGATIVE NEGATIVE Final    Comment: (NOTE) The Xpert SA Assay (FDA approved for NASAL specimens in patients 22 years of age and older), is one component of a comprehensive surveillance program. It is not intended to diagnose infection nor to guide or monitor treatment. Performed at [facility], [place] [address], [place], [place] [address]          Radiology Studies: Dg Chest 1 View  Result Date: 01/21/2018 CLINICAL DATA:  Left hip pain after fall. EXAM: CHEST  1 VIEW COMPARISON:  Radiographs of August 24, 2011. FINDINGS: The heart size and mediastinal contours are within normal limits. Atherosclerosis of thoracic aorta is noted. Both lungs are clear. The visualized skeletal structures are unremarkable. IMPRESSION: No acute  cardiopulmonary abnormality seen. Aortic Atherosclerosis (ICD10-I70.0). Electronically Signed   By: Marijo Conception, M.D.   On: 01/21/2018 21:34   Ct Head Wo Contrast  Result Date: 01/21/2018 CLINICAL DATA:  Status post fall onto left side. Concern for head or cervical spine injury. Initial encounter. EXAM: CT HEAD WITHOUT CONTRAST CT CERVICAL SPINE WITHOUT CONTRAST TECHNIQUE: Multidetector CT imaging of the head and cervical spine was performed following the standard protocol without intravenous contrast. Multiplanar CT image reconstructions of the cervical spine were also generated. COMPARISON:  Cervical spine radiograph performed 11/04/2017 FINDINGS: CT HEAD FINDINGS Brain: No evidence of acute infarction, hemorrhage, hydrocephalus, extra-axial collection or mass lesion / mass effect. Prominence of the ventricles and sulci reflects mild cortical volume loss. Scattered periventricular and subcortical white matter change likely reflects small vessel ischemic microangiopathy.  Mild cerebellar atrophy is noted. The brainstem and fourth ventricle are within normal limits. The basal ganglia are unremarkable in appearance. The cerebral hemispheres demonstrate grossly normal gray-white differentiation. No mass effect or midline shift is seen. Vascular: No hyperdense vessel or unexpected calcification. Skull: There is no evidence of fracture; visualized osseous structures are unremarkable in appearance. Sinuses/Orbits: The orbits are within normal limits. There is opacification and mucoperiosteal thickening at the left side of the sphenoid sinus. The remaining paranasal sinuses and mastoid air cells are well-aerated. Other: No significant soft tissue abnormalities are seen. CT CERVICAL SPINE FINDINGS Alignment: Normal. Skull base and vertebrae: No acute fracture. No primary bone lesion or focal pathologic process. Soft tissues and spinal canal: No prevertebral fluid or swelling. No visible canal hematoma. Disc levels:  There is mild intervertebral disc space narrowing at C4-C5. Intervertebral disc spaces are grossly preserved. Upper chest: The visualized lung apices are clear. The thyroid gland is unremarkable. Calcification is seen at the carotid bifurcations bilaterally. Other: No additional soft tissue abnormalities are seen. IMPRESSION: 1. No evidence of traumatic intracranial injury or fracture. 2. No evidence of fracture or subluxation along the cervical spine. 3. Mild cortical volume loss and scattered small vessel ischemic microangiopathy. 4. Opacification and mucoperiosteal thickening at the left side of the sphenoid sinus. 5. Calcification at the carotid bifurcations bilaterally. Carotid ultrasound would be helpful for further evaluation, when and as deemed clinically appropriate. Electronically Signed   By: Garald Balding M.D.   On: 01/21/2018 21:51   Ct Cervical Spine Wo Contrast  Result Date: 01/21/2018 CLINICAL DATA:  Status post fall onto left side. Concern for head or cervical spine injury. Initial encounter. EXAM: CT HEAD WITHOUT CONTRAST CT CERVICAL SPINE WITHOUT CONTRAST TECHNIQUE: Multidetector CT imaging of the head and cervical spine was performed following the standard protocol without intravenous contrast. Multiplanar CT image reconstructions of the cervical spine were also generated. COMPARISON:  Cervical spine radiograph performed 11/04/2017 FINDINGS: CT HEAD FINDINGS Brain: No evidence of acute infarction, hemorrhage, hydrocephalus, extra-axial collection or mass lesion / mass effect. Prominence of the ventricles and sulci reflects mild cortical volume loss. Scattered periventricular and subcortical white matter change likely reflects small vessel ischemic microangiopathy. Mild cerebellar atrophy is noted. The brainstem and fourth ventricle are within normal limits. The basal ganglia are unremarkable in appearance. The cerebral hemispheres demonstrate grossly normal gray-white differentiation. No  mass effect or midline shift is seen. Vascular: No hyperdense vessel or unexpected calcification. Skull: There is no evidence of fracture; visualized osseous structures are unremarkable in appearance. Sinuses/Orbits: The orbits are within normal limits. There is opacification and mucoperiosteal thickening at the left side of the sphenoid sinus. The remaining paranasal sinuses and mastoid air cells are well-aerated. Other: No significant soft tissue abnormalities are seen. CT CERVICAL SPINE FINDINGS Alignment: Normal. Skull base and vertebrae: No acute fracture. No primary bone lesion or focal pathologic process. Soft tissues and spinal canal: No prevertebral fluid or swelling. No visible canal hematoma. Disc levels: There is mild intervertebral disc space narrowing at C4-C5. Intervertebral disc spaces are grossly preserved. Upper chest: The visualized lung apices are clear. The thyroid gland is unremarkable. Calcification is seen at the carotid bifurcations bilaterally. Other: No additional soft tissue abnormalities are seen. IMPRESSION: 1. No evidence of traumatic intracranial injury or fracture. 2. No evidence of fracture or subluxation along the cervical spine. 3. Mild cortical volume loss and scattered small vessel ischemic microangiopathy. 4. Opacification and mucoperiosteal thickening at the left side of  the sphenoid sinus. 5. Calcification at the carotid bifurcations bilaterally. Carotid ultrasound would be helpful for further evaluation, when and as deemed clinically appropriate. Electronically Signed   By: Garald Balding M.D.   On: 01/21/2018 21:51   Dg Hip Unilat W Or Wo Pelvis 2-3 Views Left  Result Date: 01/21/2018 CLINICAL DATA:  Left hip pain after fall today. EXAM: DG HIP (WITH OR WITHOUT PELVIS) 2-3V LEFT COMPARISON:  None. FINDINGS: Moderately displaced and comminuted fracture is seen involving the intertrochanteric portion of the proximal left femur. No other abnormality is noted. IMPRESSION:  Moderately displaced and comminuted intertrochanteric fracture of proximal left femur. Electronically Signed   By: Marijo Conception, M.D.   On: 01/21/2018 21:33        Scheduled Meds: . [MAR Hold] pantoprazole  40 mg Oral QAC breakfast  . [MAR Hold] timolol  1 drop Both Eyes Daily   Continuous Infusions: . ceFAZolin    . lactated ringers 10 mL/hr at 01/22/18 0823  . [MAR Hold] methocarbamol (ROBAXIN) IV       LOS: 1 day    Time spent: 25 minutes    Dana Allan, MD  [facility] Pager #: [phone] 7PM-7AM contact night coverage as above

## 2018-01-23 ENCOUNTER — Encounter (HOSPITAL_COMMUNITY): Payer: Self-pay | Admitting: Orthopaedic Surgery

## 2018-01-23 ENCOUNTER — Inpatient Hospital Stay (HOSPITAL_COMMUNITY): Payer: Medicare Other

## 2018-01-23 DIAGNOSIS — R42 Dizziness and giddiness: Secondary | ICD-10-CM

## 2018-01-23 LAB — CBC
HEMATOCRIT: 26.8 % — AB (ref 36.0–46.0)
Hemoglobin: 8 g/dL — ABNORMAL LOW (ref 12.0–15.0)
MCH: 26.9 pg (ref 26.0–34.0)
MCHC: 29.9 g/dL — ABNORMAL LOW (ref 30.0–36.0)
MCV: 90.2 fL (ref 80.0–100.0)
Platelets: 164 10*3/uL (ref 150–400)
RBC: 2.97 MIL/uL — AB (ref 3.87–5.11)
RDW: 14.2 % (ref 11.5–15.5)
WBC: 8.5 10*3/uL (ref 4.0–10.5)
nRBC: 0 % (ref 0.0–0.2)

## 2018-01-23 LAB — BASIC METABOLIC PANEL
Anion gap: 3 — ABNORMAL LOW (ref 5–15)
BUN: 11 mg/dL (ref 8–23)
CO2: 28 mmol/L (ref 22–32)
Calcium: 7.9 mg/dL — ABNORMAL LOW (ref 8.9–10.3)
Chloride: 105 mmol/L (ref 98–111)
Creatinine, Ser: 0.65 mg/dL (ref 0.44–1.00)
GFR calc Af Amer: >60 mL/min (ref >60)
GLUCOSE: 135 mg/dL — AB (ref 70–99)
POTASSIUM: 3.9 mmol/L (ref 3.5–5.1)
Sodium: 136 mmol/L (ref 135–145)

## 2018-01-23 LAB — URINE CULTURE: Culture: NO GROWTH

## 2018-01-23 LAB — VITAMIN D 25 HYDROXY (VIT D DEFICIENCY, FRACTURES): Vit D, 25-Hydroxy: 27.9 ng/mL — ABNORMAL LOW (ref 30.0–100.0)

## 2018-01-23 MED ORDER — DIPHENHYDRAMINE HCL 25 MG PO CAPS
25.0000 mg | ORAL_CAPSULE | Freq: Two times a day (BID) | ORAL | Status: DC | PRN
  Administered 2018-01-24: 25 mg via ORAL
  Filled 2018-01-23: qty 1

## 2018-01-23 NOTE — Evaluation (Signed)
Occupational Therapy Evaluation Patient Details Name: Brianna Barnes MRN: [id number] DOB: 1928/08/09 Today's Date: 01/23/2018    History of Present Illness The patient is an 84 year old female who sustained an accidental mechanical fall yesterday injuring her left hip. Intertrochanteric fracture, now s/p IMNail; WBAT;  Wears glasses, and Wears hearing aid.   Clinical Impression   Pt admitted with the above diagnosis and has the deficits listed below. Pt would benefit from cont OT to increase independence with basic adls and transfers so she can eventually d/c home to live alone again.  Pt has daughter that can assist but cannot provide 24 hour assist.    Follow Up Recommendations  SNF;Supervision/Assistance - 24 hour    Equipment Recommendations  None recommended by OT    Recommendations for Other Services       Precautions / Restrictions Precautions Precautions: Fall Restrictions Weight Bearing Restrictions: Yes LLE Weight Bearing: Weight bearing as tolerated      Mobility Bed Mobility               General bed mobility comments: Pt on edge of bed on arrival.  Transfers Overall transfer level: Needs assistance Equipment used: Rolling walker (2 wheeled) Transfers: Stand Pivot Transfers;Sit to/from Stand Sit to Stand: Mod assist Stand pivot transfers: Mod assist       General transfer comment: Pt did better and better with each transfer.  Pt did need cues for hand placement each time she transferred.    Balance Overall balance assessment: Needs assistance;History of Falls Sitting-balance support: Feet supported Sitting balance-Leahy Scale: Good     Standing balance support: Bilateral upper extremity supported;During functional activity Standing balance-Leahy Scale: Poor Standing balance comment: Pt must have outside support to reman standing.                           ADL either performed or assessed with clinical judgement   ADL Overall  ADL's : Needs assistance/impaired Eating/Feeding: Independent;Sitting   Grooming: Wash/dry hands;Wash/dry face;Oral care;Set up;Sitting   Upper Body Bathing: Set up;Sitting   Lower Body Bathing: Sit to/from stand;Maximal assistance;Cueing for compensatory techniques   Upper Body Dressing : Set up;Sitting   Lower Body Dressing: Maximal assistance;Sit to/from stand   Toilet Transfer: Moderate assistance;Stand-pivot;BSC;RW   Toileting- Clothing Manipulation and Hygiene: Maximal assistance;Sit to/from stand       Functional mobility during ADLs: Moderate assistance;Rolling walker General ADL Comments: Pt doing very well for first day. Pt most limited with LE adls and adls in standing due to pain.      Vision Baseline Vision/History: No visual deficits Patient Visual Report: No change from baseline       Perception Perception Perception Tested?: No   Praxis Praxis Praxis tested?: Not tested    Pertinent Vitals/Pain Pain Assessment: Faces Faces Pain Scale: Hurts whole lot Pain Location: L hip Pain Descriptors / Indicators: Aching;Sore;Operative site guarding Pain Intervention(s): Limited activity within patient's tolerance;Monitored during session;Premedicated before session;Repositioned     Hand Dominance Right   Extremity/Trunk Assessment Upper Extremity Assessment Upper Extremity Assessment: Overall WFL for tasks assessed   Lower Extremity Assessment Lower Extremity Assessment: Defer to PT evaluation   Cervical / Trunk Assessment Cervical / Trunk Assessment: Normal   Communication Communication Communication: HOH   Cognition Arousal/Alertness: Awake/alert Behavior During Therapy: WFL for tasks assessed/performed Overall Cognitive Status: Within Functional Limits for tasks assessed  General Comments  Pt mildly dizzy during evaluation but BP WFL.  Pain limiting movement but pt progressing very well throughout  session.    Exercises     Shoulder Instructions      Home Living Family/patient expects to be discharged to:: Skilled nursing facility Living Arrangements: Alone                               Additional Comments: Previously fairly independent outside of driving.      Prior Functioning/Environment Level of Independence: Independent                 OT Problem List: Decreased activity tolerance;Impaired balance (sitting and/or standing);Decreased knowledge of use of DME or AE;Decreased knowledge of precautions;Pain      OT Treatment/Interventions: Self-care/ADL training;DME and/or AE instruction;Therapeutic activities    OT Goals(Current goals can be found in the care plan section) Acute Rehab OT Goals Patient Stated Goal: to get rid of this pain OT Goal Formulation: With patient/family Time For Goal Achievement: 02/06/18 Potential to Achieve Goals: Good ADL Goals Pt Will Perform Grooming: with supervision;standing Pt Will Perform Lower Body Bathing: with supervision;with adaptive equipment Pt Will Perform Lower Body Dressing: with min guard assist;with adaptive equipment Additional ADL Goal #1: Pt will walk to bathroom with walker and complete all toileting with supervision.  OT Frequency: Min 2X/week   Barriers to D/C: Decreased caregiver support  lives alone       Co-evaluation              AM-PAC PT "6 Clicks" Daily Activity     Outcome Measure Help from another person eating meals?: None Help from another person taking care of personal grooming?: None Help from another person toileting, which includes using toliet, bedpan, or urinal?: A Lot Help from another person bathing (including washing, rinsing, drying)?: A Lot Help from another person to put on and taking off regular upper body clothing?: A Little Help from another person to put on and taking off regular lower body clothing?: A Lot 6 Click Score: 17   End of Session Equipment  Utilized During Treatment: Rolling walker Nurse Communication: Mobility status  Activity Tolerance: Patient limited by pain Patient left: in chair;with call bell/phone within reach;with chair alarm set;with family/visitor present  OT Visit Diagnosis: Other abnormalities of gait and mobility (R26.89)                Time: 0925-1000 OT Time Calculation (min): 35 min Charges:  OT General Charges $OT Visit: 1 Visit OT Evaluation $OT Eval Moderate Complexity: 1 Mod OT Treatments $Self Care/Home Management : 8-22 mins  Jinger Neighbors, OTR/L [phone]  Glenford Peers 01/23/2018, 10:24 AM

## 2018-01-23 NOTE — Plan of Care (Signed)
  Problem: Education: Goal: Knowledge of General Education information will improve Description Including pain rating scale, medication(s)/side effects and non-pharmacologic comfort measures Outcome: Progressing   Problem: Health Behavior/Discharge Planning: Goal: Ability to manage health-related needs will improve Outcome: Progressing   Problem: Clinical Measurements: Goal: Ability to maintain clinical measurements within normal limits will improve Outcome: Progressing Goal: Will remain free from infection Outcome: Progressing   Problem: Activity: Goal: Risk for activity intolerance will decrease Outcome: Progressing   Problem: Nutrition: Goal: Adequate nutrition will be maintained Outcome: Progressing   Problem: Coping: Goal: Level of anxiety will decrease Outcome: Progressing   Problem: Elimination: Goal: Will not experience complications related to bowel motility Outcome: Progressing   Problem: Safety: Goal: Ability to remain free from injury will improve Outcome: Progressing   Problem: Skin Integrity: Goal: Risk for impaired skin integrity will decrease Outcome: Progressing

## 2018-01-23 NOTE — Progress Notes (Signed)
PROGRESS NOTE    Brianna Barnes  RWE:[id number] DOB: [birth year] DOA: 01/21/2018 PCP: Jinny Sanders, MD  Outpatient Specialists:    Brief Narrative:  Patient is an 84 year old female, with past medical history significant for cirrhosis, hypertension, and Ambien vitamin D deficiency.  Patient was admitted with left hip fracture following a fall at home.  Orthopedic input is appreciated, patient is status post ORIF of the left hip.  Physical therapy input is appreciated.  The patient will need short-term skilled nursing facility placement.  We will consult the social work.  Otherwise, no new complaints today.   Assessment & Plan:   Principal Problem:   Closed fracture of femur, intertrochanteric, left, initial encounter ([place]) Active Problems:   Vitamin D deficiency   Hyperlipidemia   Iron deficiency anemia   Essential hypertension, benign   GERD   Osteoporosis   Cryptogenic cirrhosis (HCC)   Dizzinesses   Carotid artery calcification, bilateral   S/p left hip fracture   Closed fracture of left femur, intertrochanteric: Status post ORIF. Orthopedic input is appreciated. Postop management as directed by the orthopedic team. For skilled nursing facility disposition. Patient remains stable.  Vitamin D deficiency:   Follow vitamin D level.  Hyperlipidemia: Stable.   Restart home medication.   Iron deficiency anemia: Anemia panel  Monitor H/H  Transfuse packed red blood cells as needed.    Essential hypertension: Monitor and optimize.  GERD: Stable.  Osteoporosis: Patient is on Fosamax at baseline.  Cryptogenic cirrhosis: Followed by GI.  Currently stable.  DVT prophylaxis:  SCD      Code Status:    DNR/DNI    Family Communication:    Disposition Plan:   This will depend on hospital course.    Consultants:   Orthopedics.  Procedures:   ORIF.  Antimicrobials:   None   Subjective: Patient seen.   No new complaints.   No pain.   No fever  chills.   No chest pain.   Shortness of breath.    Objective: Vitals:   01/22/18 2140 01/23/18 0500 01/23/18 1100 01/23/18 1432  BP: (!) 106/51 (!) 100/49 (!) 109/49 (!) 115/50  Pulse: 73 71 79 81  Resp: 16   14  Temp: 98.6 F (37 C) 98.1 F (36.7 C) 98.4 F (36.9 C) 98.4 F (36.9 C)  TempSrc: Oral Oral Oral Oral  SpO2:  91%  95%  Weight:      Height:        Intake/Output Summary (Last 24 hours) at 01/23/2018 1647 Last data filed at 01/23/2018 1000 Gross per 24 hour  Intake 674.83 ml  Output 500 ml  Net 174.83 ml   Filed Weights   01/21/18 2041  Weight: 49.2 kg    Examination:  General exam: Appears calm and comfortable  Respiratory system: Clear to auscultation. Respiratory effort normal. Cardiovascular system: S1 & S2 heard.  No pedal edema. Gastrointestinal system: Abdomen is nondistended, soft and nontender. No organomegaly or masses felt. Normal bowel sounds heard. Central nervous system: Alert and oriented. No focal neurological deficits. Extremities: No leg edema.    Data Reviewed: I have personally reviewed following labs and imaging studies  CBC: Recent Labs  Lab 01/21/18 2047 01/22/18 0347 01/23/18 0226  WBC 6.7 7.6 8.5  NEUTROABS 5.0  --   --   HGB 10.5* 8.8* 8.0*  HCT 34.1* 28.3* 26.8*  MCV 91.9 89.6 90.2  PLT 192 155 619   Basic Metabolic Panel: Recent Labs  Lab 01/21/18 2047  01/22/18 0347 01/23/18 0226  NA 140 140 136  K 4.1 3.8 3.9  CL 109 110 105  CO2 26 25 28   GLUCOSE 138* 133* 135*  BUN 11 12 11   CREATININE 0.73 0.59 0.65  CALCIUM 8.2* 8.1* 7.9*   GFR: Estimated Creatinine Clearance: 37.4 mL/min (by C-G formula based on SCr of 0.65 mg/dL). Liver Function Tests: Recent Labs  Lab 01/21/18 2239 01/22/18 0347  AST 47*  --   ALT 25  --   ALKPHOS 73  --   BILITOT 0.8  --   PROT 5.4*  --   ALBUMIN 3.3* 2.9*   No results for input(s): LIPASE, AMYLASE in the last 168 hours. No results for input(s): AMMONIA in the last 168  hours. Coagulation Profile: Recent Labs  Lab 01/21/18 2201  INR 1.15   Cardiac Enzymes: No results for input(s): CKTOTAL, CKMB, CKMBINDEX, TROPONINI in the last 168 hours. BNP (last 3 results) No results for input(s): PROBNP in the last 8760 hours. HbA1C: No results for input(s): HGBA1C in the last 72 hours. CBG: No results for input(s): GLUCAP in the last 168 hours. Lipid Profile: No results for input(s): CHOL, HDL, LDLCALC, TRIG, CHOLHDL, LDLDIRECT in the last 72 hours. Thyroid Function Tests: No results for input(s): TSH, T4TOTAL, FREET4, T3FREE, THYROIDAB in the last 72 hours. Anemia Panel: Recent Labs    01/22/18 0347  VITAMINB12 267  FOLATE 11.5  FERRITIN 7*  TIBC 370  IRON 47  RETICCTPCT 1.7   Urine analysis:    Component Value Date/Time   COLORURINE YELLOW 01/22/2018 0100   APPEARANCEUR CLEAR 01/22/2018 0100   LABSPEC 1.018 01/22/2018 0100   PHURINE 6.0 01/22/2018 0100   GLUCOSEU NEGATIVE 01/22/2018 0100   HGBUR NEGATIVE 01/22/2018 0100   BILIRUBINUR NEGATIVE 01/22/2018 0100   BILIRUBINUR Negative 11/04/2017 1223   KETONESUR NEGATIVE 01/22/2018 0100   PROTEINUR NEGATIVE 01/22/2018 0100   UROBILINOGEN 0.2 11/04/2017 1223   UROBILINOGEN 0.2 05/01/2011 1347   NITRITE NEGATIVE 01/22/2018 0100   LEUKOCYTESUR SMALL (A) 01/22/2018 0100   Sepsis Labs: @LABRCNTIP (procalcitonin:4,lacticidven:4)  ) Recent Results (from the past 240 hour(s))  Surgical pcr screen     Status: None   Collection Time: 01/22/18 12:24 AM  Result Value Ref Range Status   MRSA, PCR NEGATIVE NEGATIVE Final   Staphylococcus aureus NEGATIVE NEGATIVE Final    Comment: (NOTE) The Xpert SA Assay (FDA approved for NASAL specimens in patients 3 years of age and older), is one component of a comprehensive surveillance program. It is not intended to diagnose infection nor to guide or monitor treatment. Performed at [facility], [place] [address], [place], [place] [address]   Urine  culture     Status: None   Collection Time: 01/22/18 12:53 AM  Result Value Ref Range Status   Specimen Description URINE, CATHETERIZED  Final   Special Requests NONE  Final   Culture   Final    NO GROWTH Performed at [facility], [address], [place], [place] [address]    Report Status 01/23/2018 FINAL  Final         Radiology Studies: Dg Chest 1 View  Result Date: 01/21/2018 CLINICAL DATA:  Left hip pain after fall. EXAM: CHEST  1 VIEW COMPARISON:  Radiographs of August 24, 2011. FINDINGS: The heart size and mediastinal contours are within normal limits. Atherosclerosis of thoracic aorta is noted. Both lungs are clear. The visualized skeletal structures are unremarkable. IMPRESSION: No acute cardiopulmonary abnormality seen.  Aortic Atherosclerosis (ICD10-I70.0). Electronically Signed   By: Marijo Conception, M.D.   On: 01/21/2018 21:34   Ct Head Wo Contrast  Result Date: 01/21/2018 CLINICAL DATA:  Status post fall onto left side. Concern for head or cervical spine injury. Initial encounter. EXAM: CT HEAD WITHOUT CONTRAST CT CERVICAL SPINE WITHOUT CONTRAST TECHNIQUE: Multidetector CT imaging of the head and cervical spine was performed following the standard protocol without intravenous contrast. Multiplanar CT image reconstructions of the cervical spine were also generated. COMPARISON:  Cervical spine radiograph performed 11/04/2017 FINDINGS: CT HEAD FINDINGS Brain: No evidence of acute infarction, hemorrhage, hydrocephalus, extra-axial collection or mass lesion / mass effect. Prominence of the ventricles and sulci reflects mild cortical volume loss. Scattered periventricular and subcortical white matter change likely reflects small vessel ischemic microangiopathy. Mild cerebellar atrophy is noted. The brainstem and fourth ventricle are within normal limits. The basal ganglia are unremarkable in appearance. The cerebral hemispheres demonstrate grossly normal gray-white differentiation.  No mass effect or midline shift is seen. Vascular: No hyperdense vessel or unexpected calcification. Skull: There is no evidence of fracture; visualized osseous structures are unremarkable in appearance. Sinuses/Orbits: The orbits are within normal limits. There is opacification and mucoperiosteal thickening at the left side of the sphenoid sinus. The remaining paranasal sinuses and mastoid air cells are well-aerated. Other: No significant soft tissue abnormalities are seen. CT CERVICAL SPINE FINDINGS Alignment: Normal. Skull base and vertebrae: No acute fracture. No primary bone lesion or focal pathologic process. Soft tissues and spinal canal: No prevertebral fluid or swelling. No visible canal hematoma. Disc levels: There is mild intervertebral disc space narrowing at C4-C5. Intervertebral disc spaces are grossly preserved. Upper chest: The visualized lung apices are clear. The thyroid gland is unremarkable. Calcification is seen at the carotid bifurcations bilaterally. Other: No additional soft tissue abnormalities are seen. IMPRESSION: 1. No evidence of traumatic intracranial injury or fracture. 2. No evidence of fracture or subluxation along the cervical spine. 3. Mild cortical volume loss and scattered small vessel ischemic microangiopathy. 4. Opacification and mucoperiosteal thickening at the left side of the sphenoid sinus. 5. Calcification at the carotid bifurcations bilaterally. Carotid ultrasound would be helpful for further evaluation, when and as deemed clinically appropriate. Electronically Signed   By: Garald Balding M.D.   On: 01/21/2018 21:51   Ct Cervical Spine Wo Contrast  Result Date: 01/21/2018 CLINICAL DATA:  Status post fall onto left side. Concern for head or cervical spine injury. Initial encounter. EXAM: CT HEAD WITHOUT CONTRAST CT CERVICAL SPINE WITHOUT CONTRAST TECHNIQUE: Multidetector CT imaging of the head and cervical spine was performed following the standard protocol without  intravenous contrast. Multiplanar CT image reconstructions of the cervical spine were also generated. COMPARISON:  Cervical spine radiograph performed 11/04/2017 FINDINGS: CT HEAD FINDINGS Brain: No evidence of acute infarction, hemorrhage, hydrocephalus, extra-axial collection or mass lesion / mass effect. Prominence of the ventricles and sulci reflects mild cortical volume loss. Scattered periventricular and subcortical white matter change likely reflects small vessel ischemic microangiopathy. Mild cerebellar atrophy is noted. The brainstem and fourth ventricle are within normal limits. The basal ganglia are unremarkable in appearance. The cerebral hemispheres demonstrate grossly normal gray-white differentiation. No mass effect or midline shift is seen. Vascular: No hyperdense vessel or unexpected calcification. Skull: There is no evidence of fracture; visualized osseous structures are unremarkable in appearance. Sinuses/Orbits: The orbits are within normal limits. There is opacification and mucoperiosteal thickening at the left side of the sphenoid sinus. The remaining paranasal sinuses  and mastoid air cells are well-aerated. Other: No significant soft tissue abnormalities are seen. CT CERVICAL SPINE FINDINGS Alignment: Normal. Skull base and vertebrae: No acute fracture. No primary bone lesion or focal pathologic process. Soft tissues and spinal canal: No prevertebral fluid or swelling. No visible canal hematoma. Disc levels: There is mild intervertebral disc space narrowing at C4-C5. Intervertebral disc spaces are grossly preserved. Upper chest: The visualized lung apices are clear. The thyroid gland is unremarkable. Calcification is seen at the carotid bifurcations bilaterally. Other: No additional soft tissue abnormalities are seen. IMPRESSION: 1. No evidence of traumatic intracranial injury or fracture. 2. No evidence of fracture or subluxation along the cervical spine. 3. Mild cortical volume loss and  scattered small vessel ischemic microangiopathy. 4. Opacification and mucoperiosteal thickening at the left side of the sphenoid sinus. 5. Calcification at the carotid bifurcations bilaterally. Carotid ultrasound would be helpful for further evaluation, when and as deemed clinically appropriate. Electronically Signed   By: Garald Balding M.D.   On: 01/21/2018 21:51   Dg C-arm 1-60 Min  Result Date: 01/22/2018 CLINICAL DATA:  ORIF intertrochanteric fracture. EXAM: LEFT FEMUR 2 VIEWS; DG C-ARM 61-120 MIN COMPARISON:  01/21/2018 FINDINGS: ORIF of intertrochanteric fracture. Components appear well positioned. Anatomic alignment. No radiographically detectable complication. IMPRESSION: Good appearance following ORIF. Electronically Signed   By: Nelson Chimes M.D.   On: 01/22/2018 12:26   Dg Hip Unilat W Or Wo Pelvis 2-3 Views Left  Result Date: 01/21/2018 CLINICAL DATA:  Left hip pain after fall today. EXAM: DG HIP (WITH OR WITHOUT PELVIS) 2-3V LEFT COMPARISON:  None. FINDINGS: Moderately displaced and comminuted fracture is seen involving the intertrochanteric portion of the proximal left femur. No other abnormality is noted. IMPRESSION: Moderately displaced and comminuted intertrochanteric fracture of proximal left femur. Electronically Signed   By: Marijo Conception, M.D.   On: 01/21/2018 21:33   Dg Femur Min 2 Views Left  Result Date: 01/22/2018 CLINICAL DATA:  ORIF intertrochanteric fracture. EXAM: LEFT FEMUR 2 VIEWS; DG C-ARM 61-120 MIN COMPARISON:  01/21/2018 FINDINGS: ORIF of intertrochanteric fracture. Components appear well positioned. Anatomic alignment. No radiographically detectable complication. IMPRESSION: Good appearance following ORIF. Electronically Signed   By: Nelson Chimes M.D.   On: 01/22/2018 12:26   Vas US Carotid  Result Date: 01/23/2018 Carotid Arterial Duplex Study Indications: Calcification. Performing Technologist: Abram Sander  Examination Guidelines: A complete evaluation  includes B-mode imaging, spectral Doppler, color Doppler, and power Doppler as needed of all accessible portions of each vessel. Bilateral testing is considered an integral part of a complete examination. Limited examinations for reoccurring indications may be performed as noted.  Right Carotid Findings: +----------+--------+--------+--------+-----------+--------+           PSV cm/sEDV cm/sStenosisDescribe   Comments +----------+--------+--------+--------+-----------+--------+ CCA Prox  94      11              homogeneous         +----------+--------+--------+--------+-----------+--------+ CCA Distal93      14              homogeneous         +----------+--------+--------+--------+-----------+--------+ ICA Prox  84      21      1-39%   homogeneous         +----------+--------+--------+--------+-----------+--------+ ICA Distal129     33                                  +----------+--------+--------+--------+-----------+--------+  ECA       147     18                                  +----------+--------+--------+--------+-----------+--------+ +----------+--------+-------+--------+-------------------+           PSV cm/sEDV cmsDescribeArm Pressure (mmHG) +----------+--------+-------+--------+-------------------+ Subclavian113                                        +----------+--------+-------+--------+-------------------+ +---------+--------+--+--------+--+---------+ VertebralPSV cm/s64EDV cm/s16Antegrade +---------+--------+--+--------+--+---------+  Left Carotid Findings: +----------+--------+--------+--------+-----------+--------+           PSV cm/sEDV cm/sStenosisDescribe   Comments +----------+--------+--------+--------+-----------+--------+ CCA Prox  138     21              homogeneous         +----------+--------+--------+--------+-----------+--------+ CCA Distal108     18              homogeneous          +----------+--------+--------+--------+-----------+--------+ ICA Prox  155     30      1-39%   homogeneous         +----------+--------+--------+--------+-----------+--------+ ICA Distal77      19                                  +----------+--------+--------+--------+-----------+--------+ ECA       132     12                                  +----------+--------+--------+--------+-----------+--------+ +----------+--------+--------+--------+-------------------+ SubclavianPSV cm/sEDV cm/sDescribeArm Pressure (mmHG) +----------+--------+--------+--------+-------------------+           154                                         +----------+--------+--------+--------+-------------------+ +---------+--------+--+--------+--+---------+ VertebralPSV cm/s94EDV cm/s14Antegrade +---------+--------+--+--------+--+---------+  Summary: Right Carotid: Velocities in the right ICA are consistent with a 1-39% stenosis. Left Carotid: Velocities in the left ICA are consistent with a 1-39% stenosis. Vertebrals: Bilateral vertebral arteries demonstrate antegrade flow. *See table(s) above for measurements and observations.  Electronically signed by Antony Contras MD on 01/23/2018 at 2:53:49 PM.    Final         Scheduled Meds: . aspirin EC  325 mg Oral Q breakfast  . docusate sodium  100 mg Oral BID  . pantoprazole  40 mg Oral QAC breakfast  . timolol  1 drop Both Eyes Daily   Continuous Infusions: . lactated ringers Stopped (01/23/18 1249)  . methocarbamol (ROBAXIN) IV       LOS: 2 days    Time spent: 25 minutes    Dana Allan, MD  [facility] Pager #: [phone] 7PM-7AM contact night coverage as above

## 2018-01-23 NOTE — Evaluation (Signed)
Clinical/Bedside Swallow Evaluation Patient Details  Name: Brianna Barnes MRN: [id number] Date of Birth: 1928-04-30  Today's Date: 01/23/2018 Time: SLP Start Time (ACUTE ONLY): 0914 SLP Stop Time (ACUTE ONLY): 0920 SLP Time Calculation (min) (ACUTE ONLY): 6 min  Past Medical History:  Past Medical History:  Diagnosis Date  . Choledocholithiasis   . Cirrhosis, cryptogenic (HCC)    FOLLOWED BY DR PYRTLE-- LOV  IN EPIC--  STABLE PER NOTE  . Cryptogenic cirrhosis ([place])   . Esophageal varix ([place])   . GERD (gastroesophageal reflux disease)   . History of colonoscopy with polypectomy    ADENOMATOUS AND HYPERPLASIA POLYPS  (2008  &  2010)  . History of hepatitis C   . History of positive PPD    + TB SKIN TEST WITHOUT TB  . Hyperlipidemia   . Hypertension, portal ([place])    SECONDARY TO CRYPTOGENIC CIRRHOSIS  . Internal hemorrhoids   . Iron deficiency anemia   . Loss of hearing    right ear  . Osteoarthritis    hands, knees, and low back  . Osteoporosis   . PUD (peptic ulcer disease)   . Tubular adenoma of colon   . Uterovaginal prolapse, incomplete   . Wears glasses   . Wears hearing aid    left ear only   Past Surgical History:  Past Surgical History:  Procedure Laterality Date  . APPENDECTOMY  1960'S  . CHOLECYSTECTOMY  08/25/2011   Procedure: LAPAROSCOPIC CHOLECYSTECTOMY WITH INTRAOPERATIVE CHOLANGIOGRAM;  Surgeon: Zenovia Jarred, MD;  Location: [facility];  Service: General;  Laterality: N/A;  . ENDOSCOPIC RETROGRADE CHOLANGIOPANCREATOGRAPHY (ERCP) WITH PROPOFOL N/A 11/30/2012   Procedure: ENDOSCOPIC RETROGRADE CHOLANGIOPANCREATOGRAPHY (ERCP) WITH PROPOFOL;  Surgeon: Milus Banister, MD;  Location: [facility];  Service: Endoscopy;  Laterality: N/A;  . EXTERNAL EAR SURGERY Left 1980  . HYSTEROSCOPY W/D&C N/A 07/15/2014   Procedure: DILATATION AND CURETTAGE /HYSTEROSCOPY;  Surgeon: Molli Posey, MD;  Location: [facility];  Service: Gynecology;  Laterality: N/A;   . KNEE ARTHROSCOPY W/ MENISCECTOMY Right 06-19-2002  . [place] SURGERY  1990  . REMOVAL OF URINARY SLING N/A 09/13/2013   Procedure: REMOVAL OF RETAINED PESSARY;  Surgeon: Jorja Loa, MD;  Location: [facility];  Service: Urology;  Laterality: N/A;   HPI:  Brianna Barnes is a 84 y.o. female with medical history significant of cirrhosis, HTN, anemia Vit D deficiency.  She suffered a mechanical fall resulting in L hip fracture.  Pt is now s/p surgery.  CXR 11/9 revealed no acute cardiopulmonary abnormality.   Assessment / Plan / Recommendation Clinical Impression  Pt presents with functional swallowing as assessed clinically.  Pt noted to clear throat frequently on SLP admission.  Family reported that this is typical especially in the mornings.  With administration of PO trials there was no change in quality or frequency of throat clearing.   Pt tolerated all consistencies trialed with no clinical s/s of aspiraiton.  CXR is clear.  Recommend continuing thin liquids and advancing to regular diet as medically appropriate. SLP Visit Diagnosis: Dysphagia, unspecified (R13.10)    Aspiration Risk  No limitations    Diet Recommendation Regular;Thin liquid   Liquid Administration via: Cup;Straw Medication Administration: Whole meds with liquid Compensations: Minimize environmental distractions;Slow rate;Small sips/bites Postural Changes: Seated upright at 90 degrees(as tolerated s/p surgery)    Other  Recommendations Oral Care Recommendations: Oral care BID   Follow up Recommendations None  Prognosis Prognosis for Safe Diet Advancement: Good      Swallow Study   General Date of Onset: 01/21/18 HPI: Brianna Barnes is a 84 y.o. female with medical history significant of cirrhosis, HTN, anemia Vit D deficiency.  She suffered a mechanical fall resulting in L hip fracture.  Pt is now s/p surgery.  CXR 11/9 revealed no acute cardiopulmonary abnormality. Type  of Study: Bedside Swallow Evaluation Diet Prior to this Study: (Clear liquid) Temperature Spikes Noted: No Respiratory Status: Room air Behavior/Cognition: Alert;Cooperative;Pleasant mood Oral Cavity Assessment: Within Functional Limits Oral Care Completed by SLP: No Oral Cavity - Dentition: Dentures, top;Dentures, bottom Self-Feeding Abilities: Able to feed self Patient Positioning: Upright in bed Baseline Vocal Quality: Normal Volitional Cough: Strong Volitional Swallow: Able to elicit    Oral/Motor/Sensory Function Facial ROM: Within Functional Limits Facial Symmetry: Within Functional Limits Lingual ROM: Within Functional Limits Lingual Symmetry: Within Functional Limits Lingual Strength: Within Functional Limits Velum: Within Functional Limits Mandible: Within Functional Limits   Ice Chips Ice chips: Not tested   Thin Liquid Thin Liquid: Within functional limits Presentation: Straw    Nectar Thick Nectar Thick Liquid: Not tested   Honey Thick Honey Thick Liquid: Not tested   Puree Puree: Within functional limits Presentation: Spoon   Solid     Solid: Within functional limits Presentation: [place], [place], [place] Office: [phone]; Pager (11/11): [phone] 01/23/2018,9:45 AM

## 2018-01-23 NOTE — Discharge Instructions (Signed)
Can put weight as tolerated on her left hip - only up with assistance. Can get current hip dressing wet in the shower daily. New dry dressing only as needed.

## 2018-01-23 NOTE — Progress Notes (Signed)
*  Preliminary Results* Carotid artery duplex has been completed. Bilateral internal carotid arteries are 1-39%. Vertebral arteries are patent with antegrade flow.  01/23/2018 1:57 PM  Brianna Barnes Dawna Part

## 2018-01-23 NOTE — Progress Notes (Signed)
Subjective: 1 Day Post-Op Procedure(s) (LRB): ORIF LEFT INTERTROCHANTRIC HIP FX (Left) Family at the bedside.  The patient is sitting in a chair eating and appears comfortable. Acute blood loss anemia from her fracture and surgery.  Objective: Vital signs in last 24 hours: Temp:  [98.1 F (36.7 C)-99.8 F (37.7 C)] 98.1 F (36.7 C) (11/11 0500) Pulse Rate:  [71-89] 71 (11/11 0500) Resp:  [16] 16 (11/10 2140) BP: (100-136)/(49-64) 100/49 (11/11 0500) SpO2:  [91 %-94 %] 91 % (11/11 0500)  Intake/Output from previous day: 11/10 0701 - 11/11 0700 In: 1273.5 [I.V.:473.5; IV Piggyback:200] Out: 725 [Urine:700; Blood:25] Intake/Output this shift: Total I/O In: 301.3 [P.O.:240; I.V.:61.3] Out: -   Recent Labs    01/21/18 2047 01/22/18 0347 01/23/18 0226  HGB 10.5* 8.8* 8.0*   Recent Labs    01/22/18 0347 01/23/18 0226  WBC 7.6 8.5  RBC 3.16*  3.16* 2.97*  HCT 28.3* 26.8*  PLT 155 164   Recent Labs    01/22/18 0347 01/23/18 0226  NA 140 136  K 3.8 3.9  CL 110 105  CO2 25 28  BUN 12 11  CREATININE 0.59 0.65  GLUCOSE 133* 135*  CALCIUM 8.1* 7.9*   Recent Labs    01/21/18 2201  INR 1.15    Sensation intact distally Intact pulses distally Dorsiflexion/Plantar flexion intact Incision: dressing C/D/I  Assessment/Plan: 1 Day Post-Op Procedure(s) (LRB): ORIF LEFT INTERTROCHANTRIC HIP FX (Left) Up with therapy  Will likely need short-term skilled nursing.    Mcarthur Rossetti 01/23/2018, 12:04 PM

## 2018-01-23 NOTE — Evaluation (Signed)
Physical Therapy Evaluation Patient Details Name: Brianna Barnes MRN: [id number] DOB: 18-Apr-1928 Today's Date: 01/23/2018   History of Present Illness  The patient is an 84 year old female who sustained an accidental mechanical fall yesterday injuring her left hip. Intertrochanteric fracture, now s/p IMNail; WBAT;  Wears glasses, and Wears hearing aid.  Clinical Impression   Patient is s/p above surgery resulting in functional limitations due to the deficits listed below (see PT Problem List). Lives at home, walks and manages ADLs independently, family helps with IADLs; presentws with decr functional mobility and decr activity tolerance;  Patient will benefit from skilled PT to increase their independence and safety with mobility to allow discharge to the venue listed below.       Follow Up Recommendations SNF    Equipment Recommendations  Rolling walker with 5" wheels;3in1 (PT)    Recommendations for Other Services       Precautions / Restrictions Precautions Precautions: Fall Restrictions Weight Bearing Restrictions: Yes LLE Weight Bearing: Weight bearing as tolerated      Mobility  Bed Mobility Overal bed mobility: Needs Assistance Bed Mobility: Supine to Sit     Supine to sit: Mod assist     General bed mobility comments: Cues for technqiue; mod assist to help scoot hips to EOB in rpep for getitng up then mod assist to elevate trunk to sit  Transfers Overall transfer level: Needs assistance Equipment used: Rolling walker (2 wheeled) Transfers: Sit to/from Stand Sit to Stand: Mod assist Stand pivot transfers: Mod assist       General transfer comment: Cues for hand placement and safety; mod assist for initial power up to stnad  Ambulation/Gait Ambulation/Gait assistance: Min assist;+2 safety/equipment Gait Distance (Feet): 10 Feet Assistive device: Rolling walker (2 wheeled) Gait Pattern/deviations: Step-to pattern;Decreased stance time - left;Decreased  step length - right     General Gait Details: Step by step cues for sequence; cues also to use RW to unweigh painful LLE in stance  Stairs            Wheelchair Mobility    Modified Rankin (Stroke Patients Only)       Balance Overall balance assessment: Needs assistance;History of Falls Sitting-balance support: Feet supported Sitting balance-Leahy Scale: Good     Standing balance support: Bilateral upper extremity supported;During functional activity Standing balance-Leahy Scale: Poor Standing balance comment: Pt must have outside support to reman standing.                             Pertinent Vitals/Pain Pain Assessment: Faces Faces Pain Scale: Hurts whole lot Pain Location: L hip Pain Descriptors / Indicators: Aching;Sore;Operative site guarding Pain Intervention(s): Monitored during session    Home Living Family/patient expects to be discharged to:: Skilled nursing facility Living Arrangements: Alone               Additional Comments: Previously fairly independent outside of driving.    Prior Function Level of Independence: Independent               Hand Dominance   Dominant Hand: Right    Extremity/Trunk Assessment   Upper Extremity Assessment Upper Extremity Assessment: Defer to OT evaluation    Lower Extremity Assessment Lower Extremity Assessment: LLE deficits/detail LLE Deficits / Details: Grossly decr AROM and strength L hip, limited by pain LLE: Unable to fully assess due to pain    Cervical / Trunk Assessment Cervical / Trunk Assessment: Normal  Communication  Communication: HOH  Cognition Arousal/Alertness: Awake/alert Behavior During Therapy: WFL for tasks assessed/performed Overall Cognitive Status: Within Functional Limits for tasks assessed                                        General Comments General comments (skin integrity, edema, etc.): Pt mildly dizzy during evaluation but BP WFL.   Pain limiting movement but pt progressing very well throughout session.    Exercises     Assessment/Plan    PT Assessment Patient needs continued PT services  PT Problem List Decreased strength;Decreased range of motion;Decreased activity tolerance;Decreased balance;Decreased mobility;Decreased knowledge of use of DME;Decreased knowledge of precautions;Pain       PT Treatment Interventions DME instruction;Gait training;Functional mobility training;Therapeutic activities;Therapeutic exercise;Balance training;Patient/family education    PT Goals (Current goals can be found in the Care Plan section)  Acute Rehab PT Goals Patient Stated Goal: to get rid of this pain PT Goal Formulation: With patient Time For Goal Achievement: 02/06/18 Potential to Achieve Goals: Good    Frequency Min 2X/week   Barriers to discharge        Co-evaluation               AM-PAC PT "6 Clicks" Daily Activity  Outcome Measure Difficulty turning over in bed (including adjusting bedclothes, sheets and blankets)?: A Lot Difficulty moving from lying on back to sitting on the side of the bed? : A Lot Difficulty sitting down on and standing up from a chair with arms (e.g., wheelchair, bedside commode, etc,.)?: Unable Help needed moving to and from a bed to chair (including a wheelchair)?: A Little Help needed walking in hospital room?: A Little Help needed climbing 3-5 steps with a railing? : A Lot 6 Click Score: 13    End of Session Equipment Utilized During Treatment: Gait belt Activity Tolerance: Patient tolerated treatment well Patient left: in chair;Other (comment)(with OT) Nurse Communication: Mobility status PT Visit Diagnosis: Unsteadiness on feet (R26.81);Other abnormalities of gait and mobility (R26.89);History of falling (Z91.81)    Time: 5056-9794([id number] with OT) PT Time Calculation (min) (ACUTE ONLY): 35 min   Charges:   PT Evaluation $PT Eval Moderate Complexity: 1 Mod           Roney [place], [place]  Acute Rehabilitation Services Pager [phone] Office [place] 01/23/2018, 1:58 PM

## 2018-01-23 NOTE — NC FL2 (Signed)
[place] LEVEL OF CARE SCREENING TOOL     IDENTIFICATION  Patient Name: Brianna Barnes Birthdate: 06-25-1928 Sex: female Admission Date (Current Location): 01/21/2018  [facility] and [place] Number:  Herbalist and Address:  [facility] [address] [place], [place] [address]      Provider Number: [id number]  Attending Physician Name and Address:  Bonnell Public, MD  Relative Name and Phone Number:  Neoma Laming (daughter) [phone]    Current Level of Care: Hospital Recommended Level of Care: [place] Prior Approval Number:    Date Approved/Denied: 01/23/18 PASRR Number: [id number]  Discharge Plan: SNF    Current Diagnoses: Patient Active Problem List   Diagnosis Date Noted  . Closed fracture of femur, intertrochanteric, left, initial encounter ([place]) 01/21/2018  . Dizzinesses 01/21/2018  . Carotid artery calcification, bilateral 01/21/2018  . S/p left hip fracture 01/21/2018  . Bacteriuria, asymptomatic 11/04/2017  . Acute neck pain 11/04/2017  . Fatigue 11/04/2017  . Situational anxiety 11/04/2017  . Right foot pain 03/22/2017  . Right ankle pain 02/25/2017  . Chronic insomnia 04/03/2016  . Mild malnutrition ([place]) 01/30/2016  . BPPV (benign paroxysmal positional vertigo) 01/30/2016  . Counseling regarding end of life decision making 01/25/2014  . Choledocholithiasis 11/30/2012  . Cryptogenic cirrhosis ([place]) 11/22/2012  . Family history of early CAD 10/29/2010  . Vitamin D deficiency 06/24/2009  . Essential hypertension, benign 06/13/2009  . Hyperlipidemia 06/06/2009  . Iron deficiency anemia 09/17/2008  . GERD 09/17/2008  . PEPTIC ULCER DISEASE 09/17/2008  . UTEROVAGINAL PROLAPSE, INCOMPLETE 09/17/2008  . OSTEOARTHRITIS 09/17/2008  . Osteoporosis 09/17/2008  . ARTERIOVENOUS MALFORMATION 09/17/2008  . POSITIVE TB SKIN TEST, WITHOUT TUBERCULOSIS 09/17/2008  . COLONIC POLYPS, BENIGN, HX  OF 09/17/2008    Orientation RESPIRATION BLADDER Height & Weight     Self, Time, Place  Normal Continent Weight: 108 lb 7.5 oz (49.2 kg) Height:  5\' 1"  (154.9 cm)  BEHAVIORAL SYMPTOMS/MOOD NEUROLOGICAL BOWEL NUTRITION STATUS      Continent Diet(see discharge summary)  AMBULATORY STATUS COMMUNICATION OF NEEDS Skin   Extensive Assist Verbally Surgical wounds(left hip closed surgical incision)                       Personal Care Assistance Level of Assistance  Bathing, Feeding, Dressing, Total care Bathing Assistance: Maximum assistance Feeding assistance: Independent Dressing Assistance: Maximum assistance Total Care Assistance: Maximum assistance   Functional Limitations Info  Sight, Hearing, Speech Sight Info: Adequate Hearing Info: Impaired(impaired hearling mostly in [place]) Speech Info: Adequate    SPECIAL CARE FACTORS FREQUENCY  PT (By licensed PT), OT (By licensed OT)     PT Frequency: min 5x weekly OT Frequency: min 2x weekly            Contractures Contractures Info: Not present    Additional Factors Info  Allergies, Code Status Code Status Info: DNR Allergies Info: Allergies:  [place]a Aspirin           Current Medications (01/23/2018):  This is the current hospital active medication list Current Facility-Administered Medications  Medication Dose Route Frequency Provider Last Rate Last Dose  . acetaminophen (TYLENOL) tablet 325-650 mg  325-650 mg Oral Q6H PRN Mcarthur Rossetti, MD   650 mg at 01/23/18 3220  . alum & mag hydroxide-simeth (MAALOX/MYLANTA) 200-200-20 MG/5ML suspension 30 mL  30 mL Oral Q4H PRN Mcarthur Rossetti, MD   30 mL at 01/23/18 1035  .  aspirin EC tablet 325 mg  325 mg Oral Q breakfast Mcarthur Rossetti, MD   325 mg at 01/23/18 0854  . bisacodyl (DULCOLAX) suppository 10 mg  10 mg Rectal Daily PRN Mcarthur Rossetti, MD      . docusate sodium (COLACE) capsule 100 mg  100 mg Oral BID Mcarthur Rossetti, MD    100 mg at 01/23/18 0854  . HYDROcodone-acetaminophen (NORCO) 7.5-325 MG per tablet 1-2 tablet  1-2 tablet Oral Q4H PRN Mcarthur Rossetti, MD   2 tablet at 01/23/18 0856  . HYDROcodone-acetaminophen (NORCO/VICODIN) 5-325 MG per tablet 1-2 tablet  1-2 tablet Oral Q4H PRN Mcarthur Rossetti, MD   1 tablet at 01/22/18 1319  . lactated ringers infusion   Intravenous Continuous Mcarthur Rossetti, MD 10 mL/hr at 01/22/18 [phone]    . menthol-cetylpyridinium (CEPACOL) lozenge 3 mg  1 lozenge Oral PRN Mcarthur Rossetti, MD       Or  . phenol (CHLORASEPTIC) mouth spray 1 spray  1 spray Mouth/Throat PRN Mcarthur Rossetti, MD      . methocarbamol (ROBAXIN) tablet 500 mg  500 mg Oral Q6H PRN Mcarthur Rossetti, MD   500 mg at 01/22/18 1417   Or  . methocarbamol (ROBAXIN) 500 mg in dextrose 5 % 50 mL IVPB  500 mg Intravenous Q6H PRN Mcarthur Rossetti, MD      . metoCLOPramide (REGLAN) tablet 5-10 mg  5-10 mg Oral Q8H PRN Mcarthur Rossetti, MD       Or  . metoCLOPramide (REGLAN) injection 5-10 mg  5-10 mg Intravenous Q8H PRN Mcarthur Rossetti, MD      . morphine 2 MG/ML injection 0.5 mg  0.5 mg Intravenous Q2H PRN Mcarthur Rossetti, MD   0.5 mg at 01/23/18 0237  . morphine 2 MG/ML injection 0.5-1 mg  0.5-1 mg Intravenous Q2H PRN Mcarthur Rossetti, MD   0.5 mg at 01/22/18 1827  . ondansetron (ZOFRAN) tablet 4 mg  4 mg Oral Q6H PRN Mcarthur Rossetti, MD       Or  . ondansetron [facility]) injection 4 mg  4 mg Intravenous Q6H PRN Mcarthur Rossetti, MD      . pantoprazole (PROTONIX) EC tablet 40 mg  40 mg Oral QAC breakfast Mcarthur Rossetti, MD   40 mg at 01/23/18 0855  . polyvinyl alcohol (LIQUIFILM TEARS) 1.4 % ophthalmic solution 1-2 drop  1-2 drop Both Eyes TID PRN Mcarthur Rossetti, MD      . timolol (TIMOPTIC) 0.5 % ophthalmic solution 1 drop  1 drop Both Eyes Daily Mcarthur Rossetti, MD   1 drop at 01/23/18 0855  .  traMADol (ULTRAM) tablet 25-50 mg  25-50 mg Oral Q12H PRN Mcarthur Rossetti, MD      . traZODone (DESYREL) tablet 50 mg  50 mg Oral QHS PRN Mcarthur Rossetti, MD         Discharge Medications: Please see discharge summary for a list of discharge medications.  Relevant Imaging Results:  Relevant Lab Results:   Additional Information SSN: [id number]  Alberteen Sam, LCSW

## 2018-01-23 NOTE — Clinical Social Work Note (Signed)
Clinical Social Work Assessment  Patient Details  Name: Brianna Barnes MRN: [id number] Date of Birth: [birth year]  Date of referral:  01/23/18               Reason for consult:  Discharge Planning                Permission sought to share information with:  Case Manager, Facility Sport and exercise psychologist, Family Supports Permission granted to share information::  Yes, Verbal Permission Granted  Name::     Tax adviser::  SNFs  Relationship::  daughter  Contact Information:  [phone]  Housing/Transportation Living arrangements for the past 2 months:  [place] of Information:  Patient Patient Interpreter Needed:  None Criminal Activity/Legal Involvement Pertinent to Current Situation/Hospitalization:  No - Comment as needed Significant Relationships:  Adult Children Lives with:  Self Do you feel safe going back to the place where you live?  No Need for family participation in patient care:  Yes (Comment)  Care giving concerns:  CSW received referral for possible SNF placement at time of discharge. Spoke with patient and daughter Brianna Barnes with family members at bedside regarding possibility of SNF placement . Patient's family  is currently unable to care for her at their home given patient's current needs and fall risk.  Patient and her daughter Brianna Barnes expressed understanding of PT recommendation and are agreeable to SNF placement at time of discharge. CSW to continue to follow and assist with discharge planning needs.     Social Worker assessment / plan:  Spoke with patient and  Her daughter Brianna Barnes  concerning possibility of rehab at [facility] before returning home.    Employment status:  Retired Nurse, adult PT Recommendations:  [place] / Referral to community resources:  [place]  Patient/Family's Response to care:  Patient and her daughter Brianna Barnes  recognize need for rehab before returning  home and are agreeable to a SNF in [place]. They report preference for [facility] with their second choice being [facility] . CSW explained insurance authorization process. Patient's family reported that they want patient to get stronger to be able to come back home.    Patient/Family's Understanding of and Emotional Response to Diagnosis, Current Treatment, and Prognosis:  Patient/family is realistic regarding therapy needs and expressed being hopeful for SNF placement. Patient expressed understanding of CSW role and discharge process as well as medical condition. No questions/concerns about plan or treatment.    Emotional Assessment Appearance:  Appears stated age Attitude/Demeanor/Rapport:  Self-Confident, Gracious Affect (typically observed):  Accepting Orientation:  Oriented to Self, Oriented to Place, Oriented to  Time, Oriented to Situation Alcohol / Substance use:  Not Applicable Psych involvement (Current and /or in the community):  No (Comment)  Discharge Needs  Concerns to be addressed:  Discharge Planning Concerns Readmission within the last 30 days:  No Current discharge risk:  Dependent with Mobility Barriers to Discharge:  Continued Medical Work up   [employer], LCSW 01/23/2018, 11:53 AM

## 2018-01-24 LAB — BASIC METABOLIC PANEL
Anion gap: 5 (ref 5–15)
BUN: 13 mg/dL (ref 8–23)
CHLORIDE: 99 mmol/L (ref 98–111)
CO2: 33 mmol/L — ABNORMAL HIGH (ref 22–32)
Calcium: 8 mg/dL — ABNORMAL LOW (ref 8.9–10.3)
Creatinine, Ser: 0.85 mg/dL (ref 0.44–1.00)
GFR calc Af Amer: >60 mL/min (ref >60)
GFR calc non Af Amer: 60 mL/min — ABNORMAL LOW (ref >60)
GLUCOSE: 116 mg/dL — AB (ref 70–99)
POTASSIUM: 3.7 mmol/L (ref 3.5–5.1)
Sodium: 137 mmol/L (ref 135–145)

## 2018-01-24 LAB — CBC
HCT: 25.2 % — ABNORMAL LOW (ref 36.0–46.0)
HEMOGLOBIN: 7.6 g/dL — AB (ref 12.0–15.0)
MCH: 27.3 pg (ref 26.0–34.0)
MCHC: 30.2 g/dL (ref 30.0–36.0)
MCV: 90.6 fL (ref 80.0–100.0)
PLATELETS: 179 10*3/uL (ref 150–400)
RBC: 2.78 MIL/uL — AB (ref 3.87–5.11)
RDW: 14.6 % (ref 11.5–15.5)
WBC: 9.1 10*3/uL (ref 4.0–10.5)
nRBC: 0 % (ref 0.0–0.2)

## 2018-01-24 LAB — HEMOGLOBIN AND HEMATOCRIT, BLOOD
HCT: 30.5 % — ABNORMAL LOW (ref 36.0–46.0)
HEMOGLOBIN: 9.3 g/dL — AB (ref 12.0–15.0)

## 2018-01-24 LAB — PREPARE RBC (CROSSMATCH)

## 2018-01-24 MED ORDER — HYDROCODONE-ACETAMINOPHEN 5-325 MG PO TABS: 1.0000 | ORAL_TABLET | Freq: Four times a day (QID) | ORAL | 0 refills | Status: DC | PRN

## 2018-01-24 MED ORDER — ASPIRIN EC 325 MG PO TBEC: 325.0000 mg | DELAYED_RELEASE_TABLET | Freq: Two times a day (BID) | ORAL | Status: DC

## 2018-01-24 MED ORDER — ASPIRIN EC 325 MG PO TBEC
325.0000 mg | DELAYED_RELEASE_TABLET | Freq: Every day | ORAL | Status: DC
  Administered 2018-01-25: 325 mg via ORAL
  Filled 2018-01-24: qty 1

## 2018-01-24 MED ORDER — SODIUM CHLORIDE 0.9% IV SOLUTION
Freq: Once | INTRAVENOUS | Status: AC
  Administered 2018-01-24: 08:00:00 via INTRAVENOUS

## 2018-01-24 MED ORDER — ASPIRIN 325 MG PO TBEC: 325.0000 mg | DELAYED_RELEASE_TABLET | Freq: Every day | ORAL | 0 refills | Status: DC

## 2018-01-24 NOTE — Care Management Important Message (Signed)
Important Message  Patient Details  Name: Brianna Barnes MRN: [id number] Date of Birth: 01/06/1929   Medicare Important Message Given:  Yes    Erenest Rasher, RN 01/24/2018, 3:07 PM

## 2018-01-24 NOTE — Progress Notes (Signed)
PROGRESS NOTE    Brianna Barnes  ERD:[id number] DOB: March 15, 1929 DOA: 01/21/2018 PCP: Jinny Sanders, MD   Brief Narrative: 84 year old with past medical history relevant for osteoporosis, cryptogenic cirrhosis, iron deficiency anemia who presented with mechanical fall complicated by left femur fracture status post ORIF on 01/22/2018.   Assessment & Plan:   Principal Problem:   Closed fracture of femur, intertrochanteric, left, initial encounter ([place]) Active Problems:   Vitamin D deficiency   Hyperlipidemia   Iron deficiency anemia   Essential hypertension, benign   GERD   Osteoporosis   Cryptogenic cirrhosis (HCC)   Dizzinesses   Carotid artery calcification, bilateral   S/p left hip fracture   #) Left femur fracture status post ORIF: Status post repair on 01/22/2018 -Pending skilled nursing facility placement -Continue pain control - Continue aspirin 325 mg twice daily for prophylaxis  #) Osteoporosis: - Hold alendronate 70 mg Tuesday  #) Anemia: Patient has a history of iron deficiency anemia.  She also likely has acute blood loss anemia. -We will transfuse 1 unit packed red blood cells today   #) Vitamin D deficiency:  - continue home medication  #) GERD: -Continue PPI  #) Cryptic cirrhosis: Followed by GI every 6 months  Fluids: Tolerating p.o. Elect lites: Monitor and supplement Nutrition: Heart healthy diet  Prophylaxis: Twice daily aspirin  Disposition: Pending skilled nursing facility  DO NOT RESUSCITATE   Consultants:   Orthopedic surgery  Procedures:   01/23/2018 carotid Doppler:  Summary: Right Carotid: Velocities in the right ICA are consistent with a 1-39% stenosis.  Left Carotid: Velocities in the left ICA are consistent with a 1-39% stenosis.  Vertebrals: Bilateral vertebral arteries demonstrate antegrade flow.  01/22/2018 ORIF of left intertrochanteric hip fracture  Antimicrobials:   Perioperative  antibiotics   Subjective: This morning patient reports she is doing fairly well.  She reports some pain in her hip but reports otherwise she is moving well.  She denies any nausea, vomiting, diarrhea.  Objective: Vitals:   01/23/18 2008 01/24/18 0516 01/24/18 0818 01/24/18 0903  BP: 136/63 113/63 (!) 139/56 (!) 109/42  Pulse: 96 (!) 108 98 88  Resp: 18 14 20 18   Temp: 98.3 F (36.8 C) 98.9 F (37.2 C) 99 F (37.2 C) 99.5 F (37.5 C)  TempSrc: Oral Oral Oral Oral  SpO2: 99% 94% (!) 88% 99%  Weight:      Height:        Intake/Output Summary (Last 24 hours) at 01/24/2018 0942 Last data filed at 01/23/2018 1811 Gross per 24 hour  Intake 329.5 ml  Output -  Net 329.5 ml   Filed Weights   01/21/18 2041  Weight: 49.2 kg    Examination:  General exam: Appears calm and comfortable  Respiratory system: Clear to auscultation. Respiratory effort normal. Cardiovascular system: Regular rate and rhythm, no murmurs  gastrointestinal system: Soft, nondistended, no rebound or guarding, plus bowel sounds. Central nervous system: Alert and oriented.  Grossly intact, moving all extremities Extremities: Left lower extremity limited by pain but noted to have intact pulses Skin: Incision site is clean dry and intact Psychiatry: Judgement and insight appear normal. Mood & affect appropriate.     Data Reviewed: I have personally reviewed following labs and imaging studies  CBC: Recent Labs  Lab 01/21/18 2047 01/22/18 0347 01/23/18 0226 01/24/18 0218  WBC 6.7 7.6 8.5 9.1  NEUTROABS 5.0  --   --   --   HGB 10.5* 8.8* 8.0* 7.6*  HCT  34.1* 28.3* 26.8* 25.2*  MCV 91.9 89.6 90.2 90.6  PLT 192 155 164 250   Basic Metabolic Panel: Recent Labs  Lab 01/21/18 2047 01/22/18 0347 01/23/18 0226 01/24/18 0218  NA 140 140 136 137  K 4.1 3.8 3.9 3.7  CL 109 110 105 99  CO2 26 25 28  33*  GLUCOSE 138* 133* 135* 116*  BUN 11 12 11 13   CREATININE 0.73 0.59 0.65 0.85  CALCIUM 8.2* 8.1*  7.9* 8.0*   GFR: Estimated Creatinine Clearance: 35.2 mL/min (by C-G formula based on SCr of 0.85 mg/dL). Liver Function Tests: Recent Labs  Lab 01/21/18 2239 01/22/18 0347  AST 47*  --   ALT 25  --   ALKPHOS 73  --   BILITOT 0.8  --   PROT 5.4*  --   ALBUMIN 3.3* 2.9*   No results for input(s): LIPASE, AMYLASE in the last 168 hours. No results for input(s): AMMONIA in the last 168 hours. Coagulation Profile: Recent Labs  Lab 01/21/18 2201  INR 1.15   Cardiac Enzymes: No results for input(s): CKTOTAL, CKMB, CKMBINDEX, TROPONINI in the last 168 hours. BNP (last 3 results) No results for input(s): PROBNP in the last 8760 hours. HbA1C: No results for input(s): HGBA1C in the last 72 hours. CBG: No results for input(s): GLUCAP in the last 168 hours. Lipid Profile: No results for input(s): CHOL, HDL, LDLCALC, TRIG, CHOLHDL, LDLDIRECT in the last 72 hours. Thyroid Function Tests: No results for input(s): TSH, T4TOTAL, FREET4, T3FREE, THYROIDAB in the last 72 hours. Anemia Panel: Recent Labs    01/22/18 0347  VITAMINB12 267  FOLATE 11.5  FERRITIN 7*  TIBC 370  IRON 47  RETICCTPCT 1.7   Sepsis Labs: No results for input(s): PROCALCITON, LATICACIDVEN in the last 168 hours.  Recent Results (from the past 240 hour(s))  Surgical pcr screen     Status: None   Collection Time: 01/22/18 12:24 AM  Result Value Ref Range Status   MRSA, PCR NEGATIVE NEGATIVE Final   Staphylococcus aureus NEGATIVE NEGATIVE Final    Comment: (NOTE) The Xpert SA Assay (FDA approved for NASAL specimens in patients 11 years of age and older), is one component of a comprehensive surveillance program. It is not intended to diagnose infection nor to guide or monitor treatment. Performed at [facility], [place] [address] [place], [place] [address]   Urine culture     Status: None   Collection Time: 01/22/18 12:53 AM  Result Value Ref Range Status   Specimen Description URINE, CATHETERIZED   Final   Special Requests NONE  Final   Culture   Final    NO GROWTH Performed at [facility], [address], [place], [place] [address]    Report Status 01/23/2018 FINAL  Final         Radiology Studies: Dg C-arm 1-60 Min  Result Date: 01/22/2018 CLINICAL DATA:  ORIF intertrochanteric fracture. EXAM: LEFT FEMUR 2 VIEWS; DG C-ARM 61-120 MIN COMPARISON:  01/21/2018 FINDINGS: ORIF of intertrochanteric fracture. Components appear well positioned. Anatomic alignment. No radiographically detectable complication. IMPRESSION: Good appearance following ORIF. Electronically Signed   By: Nelson Chimes M.D.   On: 01/22/2018 12:26   Dg Femur Min 2 Views Left  Result Date: 01/22/2018 CLINICAL DATA:  ORIF intertrochanteric fracture. EXAM: LEFT FEMUR 2 VIEWS; DG C-ARM 61-120 MIN COMPARISON:  01/21/2018 FINDINGS: ORIF of intertrochanteric fracture. Components appear well positioned. Anatomic alignment. No radiographically detectable complication. IMPRESSION: Good appearance following ORIF.  Electronically Signed   By: Nelson Chimes M.D.   On: 01/22/2018 12:26   Vas US Carotid  Result Date: 01/23/2018 Carotid Arterial Duplex Study Indications: Calcification. Performing Technologist: Abram Sander  Examination Guidelines: A complete evaluation includes B-mode imaging, spectral Doppler, color Doppler, and power Doppler as needed of all accessible portions of each vessel. Bilateral testing is considered an integral part of a complete examination. Limited examinations for reoccurring indications may be performed as noted.  Right Carotid Findings: +----------+--------+--------+--------+-----------+--------+           PSV cm/sEDV cm/sStenosisDescribe   Comments +----------+--------+--------+--------+-----------+--------+ CCA Prox  94      11              homogeneous         +----------+--------+--------+--------+-----------+--------+ CCA Distal93      14              homogeneous          +----------+--------+--------+--------+-----------+--------+ ICA Prox  84      21      1-39%   homogeneous         +----------+--------+--------+--------+-----------+--------+ ICA Distal129     33                                  +----------+--------+--------+--------+-----------+--------+ ECA       147     18                                  +----------+--------+--------+--------+-----------+--------+ +----------+--------+-------+--------+-------------------+           PSV cm/sEDV cmsDescribeArm Pressure (mmHG) +----------+--------+-------+--------+-------------------+ Subclavian113                                        +----------+--------+-------+--------+-------------------+ +---------+--------+--+--------+--+---------+ VertebralPSV cm/s64EDV cm/s16Antegrade +---------+--------+--+--------+--+---------+  Left Carotid Findings: +----------+--------+--------+--------+-----------+--------+           PSV cm/sEDV cm/sStenosisDescribe   Comments +----------+--------+--------+--------+-----------+--------+ CCA Prox  138     21              homogeneous         +----------+--------+--------+--------+-----------+--------+ CCA Distal108     18              homogeneous         +----------+--------+--------+--------+-----------+--------+ ICA Prox  155     30      1-39%   homogeneous         +----------+--------+--------+--------+-----------+--------+ ICA Distal77      19                                  +----------+--------+--------+--------+-----------+--------+ ECA       132     12                                  +----------+--------+--------+--------+-----------+--------+ +----------+--------+--------+--------+-------------------+ SubclavianPSV cm/sEDV cm/sDescribeArm Pressure (mmHG) +----------+--------+--------+--------+-------------------+           154                                          +----------+--------+--------+--------+-------------------+ +---------+--------+--+--------+--+---------+  VertebralPSV cm/s94EDV cm/s14Antegrade +---------+--------+--+--------+--+---------+  Summary: Right Carotid: Velocities in the right ICA are consistent with a 1-39% stenosis. Left Carotid: Velocities in the left ICA are consistent with a 1-39% stenosis. Vertebrals: Bilateral vertebral arteries demonstrate antegrade flow. *See table(s) above for measurements and observations.  Electronically signed by [place]y Contras MD on 01/23/2018 at 2:53:49 PM.    Final         Scheduled Meds: . aspirin EC  325 mg Oral Q breakfast  . docusate sodium  100 mg Oral BID  . pantoprazole  40 mg Oral QAC breakfast  . timolol  1 drop Both Eyes Daily   Continuous Infusions: . lactated ringers Stopped (01/23/18 1249)  . methocarbamol (ROBAXIN) IV       LOS: 3 days    Time spent: [place], MD [facility]   If 7PM-7AM, please contact night-coverage [email or web address] Password [facility] 01/24/2018, 9:42 AM

## 2018-01-24 NOTE — Plan of Care (Signed)

## 2018-01-24 NOTE — Progress Notes (Signed)
Patient ID: Brianna Barnes, female   DOB: 06/30/1928, 84 y.o.   MRN: [id number] No acute changes.  Working slowly with therapy.  On 325 mg aspirin twice daily.  Can actually go down to once daily.  Will need skilled nursing post her hospital stay.  Hgb > 8, but stable vitals.

## 2018-01-24 NOTE — Plan of Care (Signed)
  Problem: Safety: Goal: Ability to remain free from injury will improve Outcome: Progressing   

## 2018-01-25 DIAGNOSIS — H409 Unspecified glaucoma: Secondary | ICD-10-CM | POA: Diagnosis not present

## 2018-01-25 DIAGNOSIS — M2550 Pain in unspecified joint: Secondary | ICD-10-CM | POA: Diagnosis not present

## 2018-01-25 DIAGNOSIS — S72012D Unspecified intracapsular fracture of left femur, subsequent encounter for closed fracture with routine healing: Secondary | ICD-10-CM | POA: Diagnosis not present

## 2018-01-25 DIAGNOSIS — G479 Sleep disorder, unspecified: Secondary | ICD-10-CM | POA: Diagnosis not present

## 2018-01-25 DIAGNOSIS — Z9181 History of falling: Secondary | ICD-10-CM | POA: Diagnosis not present

## 2018-01-25 DIAGNOSIS — S72142A Displaced intertrochanteric fracture of left femur, initial encounter for closed fracture: Secondary | ICD-10-CM | POA: Diagnosis not present

## 2018-01-25 DIAGNOSIS — D649 Anemia, unspecified: Secondary | ICD-10-CM | POA: Diagnosis not present

## 2018-01-25 DIAGNOSIS — R110 Nausea: Secondary | ICD-10-CM | POA: Diagnosis not present

## 2018-01-25 DIAGNOSIS — R9389 Abnormal findings on diagnostic imaging of other specified body structures: Secondary | ICD-10-CM | POA: Diagnosis not present

## 2018-01-25 DIAGNOSIS — K219 Gastro-esophageal reflux disease without esophagitis: Secondary | ICD-10-CM | POA: Diagnosis not present

## 2018-01-25 DIAGNOSIS — D509 Iron deficiency anemia, unspecified: Secondary | ICD-10-CM | POA: Diagnosis not present

## 2018-01-25 DIAGNOSIS — R2681 Unsteadiness on feet: Secondary | ICD-10-CM | POA: Diagnosis not present

## 2018-01-25 DIAGNOSIS — K7460 Unspecified cirrhosis of liver: Secondary | ICD-10-CM | POA: Diagnosis not present

## 2018-01-25 DIAGNOSIS — E560 Deficiency of vitamin E: Secondary | ICD-10-CM | POA: Diagnosis not present

## 2018-01-25 DIAGNOSIS — I10 Essential (primary) hypertension: Secondary | ICD-10-CM | POA: Diagnosis not present

## 2018-01-25 DIAGNOSIS — I6523 Occlusion and stenosis of bilateral carotid arteries: Secondary | ICD-10-CM | POA: Diagnosis not present

## 2018-01-25 DIAGNOSIS — Z7401 Bed confinement status: Secondary | ICD-10-CM | POA: Diagnosis not present

## 2018-01-25 DIAGNOSIS — S72302D Unspecified fracture of shaft of left femur, subsequent encounter for closed fracture with routine healing: Secondary | ICD-10-CM | POA: Diagnosis not present

## 2018-01-25 DIAGNOSIS — R7981 Abnormal blood-gas level: Secondary | ICD-10-CM | POA: Diagnosis not present

## 2018-01-25 DIAGNOSIS — R2689 Other abnormalities of gait and mobility: Secondary | ICD-10-CM | POA: Diagnosis not present

## 2018-01-25 DIAGNOSIS — M25552 Pain in left hip: Secondary | ICD-10-CM | POA: Diagnosis not present

## 2018-01-25 DIAGNOSIS — M6281 Muscle weakness (generalized): Secondary | ICD-10-CM | POA: Diagnosis not present

## 2018-01-25 DIAGNOSIS — M810 Age-related osteoporosis without current pathological fracture: Secondary | ICD-10-CM | POA: Diagnosis not present

## 2018-01-25 DIAGNOSIS — Z4789 Encounter for other orthopedic aftercare: Secondary | ICD-10-CM | POA: Diagnosis not present

## 2018-01-25 DIAGNOSIS — R05 Cough: Secondary | ICD-10-CM | POA: Diagnosis not present

## 2018-01-25 DIAGNOSIS — Z7722 Contact with and (suspected) exposure to environmental tobacco smoke (acute) (chronic): Secondary | ICD-10-CM | POA: Diagnosis not present

## 2018-01-25 LAB — BPAM RBC
BLOOD PRODUCT EXPIRATION DATE: 201912052359
ISSUE DATE / TIME: 201911120815
Unit Type and Rh: 6200

## 2018-01-25 LAB — TYPE AND SCREEN
ABO/RH(D): A POS
ANTIBODY SCREEN: NEGATIVE
UNIT DIVISION: 0

## 2018-01-25 LAB — CBC
HCT: 28.6 % — ABNORMAL LOW (ref 36.0–46.0)
Hemoglobin: 8.7 g/dL — ABNORMAL LOW (ref 12.0–15.0)
MCH: 27.5 pg (ref 26.0–34.0)
MCHC: 30.4 g/dL (ref 30.0–36.0)
MCV: 90.5 fL (ref 80.0–100.0)
Platelets: 150 10*3/uL (ref 150–400)
RBC: 3.16 MIL/uL — ABNORMAL LOW (ref 3.87–5.11)
RDW: 14.6 % (ref 11.5–15.5)
WBC: 6.5 10*3/uL (ref 4.0–10.5)
nRBC: 0.3 % — ABNORMAL HIGH (ref 0.0–0.2)

## 2018-01-25 NOTE — Progress Notes (Signed)
PROGRESS NOTE    Brianna Barnes  LNL:[id number] DOB: 04-01-1928 DOA: 01/21/2018 PCP: Jinny Sanders, MD   Brief Narrative: 84 year old with past medical history relevant for osteoporosis, cryptogenic cirrhosis, iron deficiency anemia who presented with mechanical fall complicated by left femur fracture status post ORIF on 01/22/2018.   Assessment & Plan:   Principal Problem:   Closed fracture of femur, intertrochanteric, left, initial encounter ([place]) Active Problems:   Vitamin D deficiency   Hyperlipidemia   Iron deficiency anemia   Essential hypertension, benign   GERD   Osteoporosis   Cryptogenic cirrhosis (HCC)   Dizzinesses   Carotid artery calcification, bilateral   S/p left hip fracture   #) Left femur fracture status post ORIF: Status post repair on 01/22/2018 -Pending skilled nursing facility placement -Continue pain control - Continue aspirin 325 mg daily for prophylaxis  #) Osteoporosis: - Hold alendronate 70 mg Tuesday  #) Anemia: Patient has a history of iron deficiency anemia.  She also likely has acute blood loss anemia. -Stable after 1 unit packed red blood cells on 01/24/2018  #) Vitamin D deficiency:  - continue home medication  #) GERD: -Continue PPI  #) Cryptic cirrhosis: Followed by GI every 6 months  Fluids: Tolerating p.o. Elect lites: Monitor and supplement Nutrition: Heart healthy diet  Prophylaxis: daily aspirin  Disposition: Pending skilled nursing facility  DO NOT RESUSCITATE   Consultants:   Orthopedic surgery  Procedures:   01/23/2018 carotid Doppler:  Summary: Right Carotid: Velocities in the right ICA are consistent with a 1-39% stenosis.  Left Carotid: Velocities in the left ICA are consistent with a 1-39% stenosis.  Vertebrals: Bilateral vertebral arteries demonstrate antegrade flow.  01/22/2018 ORIF of left intertrochanteric hip fracture  Antimicrobials:   Perioperative antibiotics   Subjective: This  morning patient is lying in bed.  She reports that her pain is well controlled.  She denies any nausea, vomiting, diarrhea, cough, congestion, rhinorrhea.  Objective: Vitals:   01/24/18 1005 01/24/18 1814 01/24/18 2017 01/25/18 0400  BP: (!) 113/55 (!) 112/51 (!) 135/50 (!) 129/53  Pulse: 84 82 88 82  Resp: 18  16 14   Temp: 98.9 F (37.2 C) 99.1 F (37.3 C) 98.9 F (37.2 C) 97.9 F (36.6 C)  TempSrc: Oral Oral Oral Oral  SpO2: 100% 91% 94% 96%  Weight:      Height:        Intake/Output Summary (Last 24 hours) at 01/25/2018 1020 Last data filed at 01/24/2018 1340 Gross per 24 hour  Intake 240 ml  Output -  Net 240 ml   Filed Weights   01/21/18 2041  Weight: 49.2 kg    Examination:  General exam: Appears calm and comfortable  Respiratory system: Clear to auscultation. Respiratory effort normal. Cardiovascular system: Regular rate and rhythm, no murmurs  gastrointestinal system: Soft, nondistended, no rebound or guarding, plus bowel sounds. Central nervous system: Alert and oriented.  Grossly intact, moving all extremities Extremities: Left lower extremity limited by pain but noted to have intact pulses Skin: Incision site is clean dry and intact Psychiatry: Judgement and insight appear normal. Mood & affect appropriate.     Data Reviewed: I have personally reviewed following labs and imaging studies  CBC: Recent Labs  Lab 01/21/18 2047 01/22/18 0347 01/23/18 0226 01/24/18 0218 01/24/18 1822 01/25/18 0205  WBC 6.7 7.6 8.5 9.1  --  6.5  NEUTROABS 5.0  --   --   --   --   --   HGB  10.5* 8.8* 8.0* 7.6* 9.3* 8.7*  HCT 34.1* 28.3* 26.8* 25.2* 30.5* 28.6*  MCV 91.9 89.6 90.2 90.6  --  90.5  PLT 192 155 164 179  --  675   Basic Metabolic Panel: Recent Labs  Lab 01/21/18 2047 01/22/18 0347 01/23/18 0226 01/24/18 0218  NA 140 140 136 137  K 4.1 3.8 3.9 3.7  CL 109 110 105 99  CO2 26 25 28  33*  GLUCOSE 138* 133* 135* 116*  BUN 11 12 11 13   CREATININE 0.73  0.59 0.65 0.85  CALCIUM 8.2* 8.1* 7.9* 8.0*   GFR: Estimated Creatinine Clearance: 35.2 mL/min (by C-G formula based on SCr of 0.85 mg/dL). Liver Function Tests: Recent Labs  Lab 01/21/18 2239 01/22/18 0347  AST 47*  --   ALT 25  --   ALKPHOS 73  --   BILITOT 0.8  --   PROT 5.4*  --   ALBUMIN 3.3* 2.9*   No results for input(s): LIPASE, AMYLASE in the last 168 hours. No results for input(s): AMMONIA in the last 168 hours. Coagulation Profile: Recent Labs  Lab 01/21/18 2201  INR 1.15   Cardiac Enzymes: No results for input(s): CKTOTAL, CKMB, CKMBINDEX, TROPONINI in the last 168 hours. BNP (last 3 results) No results for input(s): PROBNP in the last 8760 hours. HbA1C: No results for input(s): HGBA1C in the last 72 hours. CBG: No results for input(s): GLUCAP in the last 168 hours. Lipid Profile: No results for input(s): CHOL, HDL, LDLCALC, TRIG, CHOLHDL, LDLDIRECT in the last 72 hours. Thyroid Function Tests: No results for input(s): TSH, T4TOTAL, FREET4, T3FREE, THYROIDAB in the last 72 hours. Anemia Panel: No results for input(s): VITAMINB12, FOLATE, FERRITIN, TIBC, IRON, RETICCTPCT in the last 72 hours. Sepsis Labs: No results for input(s): PROCALCITON, LATICACIDVEN in the last 168 hours.  Recent Results (from the past 240 hour(s))  Surgical pcr screen     Status: None   Collection Time: 01/22/18 12:24 AM  Result Value Ref Range Status   MRSA, PCR NEGATIVE NEGATIVE Final   Staphylococcus aureus NEGATIVE NEGATIVE Final    Comment: (NOTE) The Xpert SA Assay (FDA approved for NASAL specimens in patients 77 years of age and older), is one component of a comprehensive surveillance program. It is not intended to diagnose infection nor to guide or monitor treatment. Performed at [facility], [place] [address] [place], [place] [address]   Urine culture     Status: None   Collection Time: 01/22/18 12:53 AM  Result Value Ref Range Status   Specimen Description  URINE, CATHETERIZED  Final   Special Requests NONE  Final   Culture   Final    NO GROWTH Performed at [facility], [address] [place], [place] [address]    Report Status 01/23/2018 FINAL  Final         Radiology Studies: Vas US Carotid  Result Date: 01/23/2018 Carotid Arterial Duplex Study Indications: Calcification. Performing Technologist: Abram Sander  Examination Guidelines: A complete evaluation includes B-mode imaging, spectral Doppler, color Doppler, and power Doppler as needed of all accessible portions of each vessel. Bilateral testing is considered an integral part of a complete examination. Limited examinations for reoccurring indications may be performed as noted.  Right Carotid Findings: +----------+--------+--------+--------+-----------+--------+           PSV cm/sEDV cm/sStenosisDescribe   Comments +----------+--------+--------+--------+-----------+--------+ CCA Prox  94      11  homogeneous         +----------+--------+--------+--------+-----------+--------+ CCA Distal93      14              homogeneous         +----------+--------+--------+--------+-----------+--------+ ICA Prox  84      21      1-39%   homogeneous         +----------+--------+--------+--------+-----------+--------+ ICA Distal129     33                                  +----------+--------+--------+--------+-----------+--------+ ECA       147     18                                  +----------+--------+--------+--------+-----------+--------+ +----------+--------+-------+--------+-------------------+           PSV cm/sEDV cmsDescribeArm Pressure (mmHG) +----------+--------+-------+--------+-------------------+ Subclavian113                                        +----------+--------+-------+--------+-------------------+ +---------+--------+--+--------+--+---------+ VertebralPSV cm/s64EDV cm/s16Antegrade  +---------+--------+--+--------+--+---------+  Left Carotid Findings: +----------+--------+--------+--------+-----------+--------+           PSV cm/sEDV cm/sStenosisDescribe   Comments +----------+--------+--------+--------+-----------+--------+ CCA Prox  138     21              homogeneous         +----------+--------+--------+--------+-----------+--------+ CCA Distal108     18              homogeneous         +----------+--------+--------+--------+-----------+--------+ ICA Prox  155     30      1-39%   homogeneous         +----------+--------+--------+--------+-----------+--------+ ICA Distal77      19                                  +----------+--------+--------+--------+-----------+--------+ ECA       132     12                                  +----------+--------+--------+--------+-----------+--------+ +----------+--------+--------+--------+-------------------+ SubclavianPSV cm/sEDV cm/sDescribeArm Pressure (mmHG) +----------+--------+--------+--------+-------------------+           154                                         +----------+--------+--------+--------+-------------------+ +---------+--------+--+--------+--+---------+ VertebralPSV cm/s94EDV cm/s14Antegrade +---------+--------+--+--------+--+---------+  Summary: Right Carotid: Velocities in the right ICA are consistent with a 1-39% stenosis. Left Carotid: Velocities in the left ICA are consistent with a 1-39% stenosis. Vertebrals: Bilateral vertebral arteries demonstrate antegrade flow. *See table(s) above for measurements and observations.  Electronically signed by [place]y Contras MD on 01/23/2018 at 2:53:49 PM.    Final         Scheduled Meds: . aspirin EC  325 mg Oral Daily  . docusate sodium  100 mg Oral BID  . pantoprazole  40 mg Oral QAC breakfast  . timolol  1 drop Both Eyes Daily   Continuous Infusions: . lactated ringers Stopped (01/23/18 1249)  .  methocarbamol  (ROBAXIN) IV       LOS: 4 days    Time spent: [place], MD [facility]   If 7PM-7AM, please contact night-coverage [email or web address] Password [email or web address] 01/25/2018, 10:20 AM

## 2018-01-25 NOTE — Clinical Social Work Placement (Signed)
   CLINICAL SOCIAL WORK PLACEMENT  NOTE  Date:  01/25/2018  Patient Details  Name: Brianna Barnes MRN: [id number] Date of Birth: 11/23/1928  Clinical Social Work is seeking post-discharge placement for this patient at the [place] level of care (*CSW will initial, date and re-position this form in  chart as items are completed):  Yes   Patient/family provided with [facility] Work Department's list of facilities offering this level of care within the geographic area requested by the patient (or if unable, by the patient's family).  Yes   Patient/family informed of their freedom to choose among providers that offer the needed level of care, that participate in Medicare, Medicaid or managed care program needed by the patient, have an available bed and are willing to accept the patient.  Yes   Patient/family informed of [facility]'s ownership interest in [facility] and [facility], as well as of the fact that they are under no obligation to receive care at these facilities.  PASRR submitted to EDS on       PASRR number received on 01/23/18     Existing PASRR number confirmed on       FL2 transmitted to all facilities in geographic area requested by pt/family on 01/23/18     FL2 transmitted to all facilities within larger geographic area on       Patient informed that his/her managed care company has contracts with or will negotiate with certain facilities, including the following:        Yes   Patient/family informed of bed offers received.  Patient chooses bed at [facility]     Physician recommends and patient chooses bed at      Patient to be transferred to [facility] on 01/25/18.  Patient to be transferred to facility by [facility]     Patient family notified on 01/25/18 of transfer.  Name of family member notified:  Neoma Laming (daughter)     PHYSICIAN       Additional Comment:     _______________________________________________ Alberteen Sam, LCSW 01/25/2018, 2:16 PM

## 2018-01-25 NOTE — Progress Notes (Signed)
Hand off report given to Debra, RN

## 2018-01-25 NOTE — Plan of Care (Signed)

## 2018-01-25 NOTE — Discharge Summary (Signed)
Physician Discharge Summary  Brianna Barnes NFA:[id number] DOB: 13-Aug-1928 DOA: 01/21/2018  PCP: Jinny Sanders, MD  Admit date: 01/21/2018 Discharge date: 01/25/2018  Admitted From: Home Disposition:  SNF  Recommendations for Outpatient Follow-up:  1. Follow up with PCP in 1-2 weeks 2. Please obtain BMP/CBC in one week   Home Health: No Equipment/Devices:None  Discharge Condition: stable CODE STATUS: DNR Diet recommendation: Heart Healthy    Brief/Interim Summary:  #) Left femur fracture status post ORIF: Patient was admitted with mechanical fall and left femur fracture.  She received an open repair on 01/22/2018.  She was continued on aspirin 325 for prophylaxis.  She may be discharged to a skilled nursing facility today.  #) Osteoporosis: Patient's alendronate was held.  This may be restarted several weeks after discharge.  #) Acute blood loss anemia superimposed on chronic iron deficiency anemia: Patient was noted to have postoperative anemia likely secondary to acute blood loss.  She received 1 unit packed red blood cells on 01/24/2018.  Her hemoglobin responded appropriately.  #) Vitamin D deficiency: Patient was continued on home medication.  #) GERD: Patient was continued on PPI.  #) Cryptogenic cirrhosis: Patient is followed by GI at this time.  She has never had a GI bleed.  She gets every 6 months ultrasounds for screening for hepatocellular carcinoma.  Discharge Diagnoses:  Principal Problem:   Closed fracture of femur, intertrochanteric, left, initial encounter ([place]) Active Problems:   Vitamin D deficiency   Hyperlipidemia   Iron deficiency anemia   Essential hypertension, benign   GERD   Osteoporosis   Cryptogenic cirrhosis (HCC)   Dizzinesses   Carotid artery calcification, bilateral   S/p left hip fracture    Discharge Instructions  Discharge Instructions    Call MD for:  difficulty breathing, headache or visual disturbances   Complete by:  As  directed    Call MD for:  hives   Complete by:  As directed    Call MD for:  persistant nausea and vomiting   Complete by:  As directed    Call MD for:  redness, tenderness, or signs of infection (pain, swelling, redness, odor or green/yellow discharge around incision site)   Complete by:  As directed    Call MD for:  severe uncontrolled pain   Complete by:  As directed    Call MD for:  temperature >100.4   Complete by:  As directed    Diet - low sodium heart healthy   Complete by:  As directed    Discharge instructions   Complete by:  As directed    Please follow-up with your primary care doctor in 1 week.   Increase activity slowly   Complete by:  As directed      Allergies as of 01/25/2018      Reactions   Asa [aspirin] Nausea Only, Other (See Comments)   Stomach is a bit sensitive      Medication List    STOP taking these medications   predniSONE 10 MG tablet Commonly known as:  DELTASONE     TAKE these medications   alendronate 70 MG tablet Commonly known as:  FOSAMAX TAKE 1 TABLET BY MOUTH ONCE A WEEK, AS DIRECTED What changed:  See the new instructions.   aspirin 325 MG EC tablet Take 1 tablet (325 mg total) by mouth daily. What changed:    medication strength  how much to take  when to take this   CALCIUM GUMMIES PO Take  1 tablet by mouth daily.   dimenhyDRINATE 50 MG tablet Commonly known as:  DRAMAMINE Take 50 mg by mouth every 8 (eight) hours as needed for dizziness.   HYDROcodone-acetaminophen 5-325 MG tablet Commonly known as:  NORCO/VICODIN Take 1 tablet by mouth every 6 (six) hours as needed for moderate pain (pain score 4-6).   ibuprofen 200 MG tablet Commonly known as:  ADVIL,MOTRIN Take 400 mg by mouth daily as needed for headache or mild pain.   IRON COMPLEX PO Take 1 tablet by mouth daily.   lidocaine 5 % Commonly known as:  LIDODERM Place 1 patch onto the skin daily. Remove & Discard patch within 12 hours or as directed by  MD   MULTI ADULT GUMMIES Chew Chew 1 tablet by mouth daily.   pantoprazole 40 MG tablet Commonly known as:  PROTONIX TAKE 1 TABLET BY MOUTH EVERY DAY What changed:  when to take this   sennosides-docusate sodium 8.6-50 MG tablet Commonly known as:  SENOKOT-S Take 1 tablet by mouth daily as needed for constipation.   simethicone 125 MG chewable tablet Commonly known as:  MYLICON Chew 371 mg by mouth every 6 (six) hours as needed for flatulence.   SYSTANE COMPLETE 0.6 % Soln Generic drug:  Propylene Glycol Place 1-2 drops into both eyes 3 (three) times daily as needed (for dryness).   timolol 0.5 % ophthalmic solution Commonly known as:  TIMOPTIC Place 1 drop into both eyes daily.   traMADol 50 MG tablet Commonly known as:  ULTRAM TAKE ONE-HALF TO ONE TABLET BY MOUTH UP TO EVERY 12 HOURS AS NEEDED FOR MODERATE PAIN. What changed:    how much to take  how to take this  when to take this  additional instructions   traZODone 50 MG tablet Commonly known as:  DESYREL TAKE 2 TABLETS BY MOUTH AT BEDTIME AS NEEDED FOR SLEEP What changed:  See the new instructions.   Vitamin D3 10 MCG (400 UNIT) Caps Take 400 mg by mouth daily.   vitamin E 400 UNIT capsule Take 400 Units by mouth daily.       Contact information for follow-up providers    Mcarthur Rossetti, MD. Schedule an appointment as soon as possible for a visit in 2 week(s).   Specialty:  Orthopedic Surgery Contact information: [place] [address] [phone]            Contact information for after-discharge care    Destination    [facility] .   Service:  Skilled Nursing Contact information: [address] [place] [phone]                 Allergies  Allergen Reactions  . Asa [Aspirin] Nausea Only and Other (See Comments)    Stomach is a bit sensitive    Consultations:  Orthopedic surgery, Dr.  [name]   Procedures/Studies: Dg Chest 1 View  Result Date: 01/21/2018 CLINICAL DATA:  Left hip pain after fall. EXAM: CHEST  1 VIEW COMPARISON:  Radiographs of August 24, 2011. FINDINGS: The heart size and mediastinal contours are within normal limits. Atherosclerosis of thoracic aorta is noted. Both lungs are clear. The visualized skeletal structures are unremarkable. IMPRESSION: No acute cardiopulmonary abnormality seen. Aortic Atherosclerosis (ICD10-I70.0). Electronically Signed   By: Marijo Conception, M.D.   On: 01/21/2018 21:34   Ct Head Wo Contrast  Result Date: 01/21/2018 CLINICAL DATA:  Status post fall onto left side. Concern  for head or cervical spine injury. Initial encounter. EXAM: CT HEAD WITHOUT CONTRAST CT CERVICAL SPINE WITHOUT CONTRAST TECHNIQUE: Multidetector CT imaging of the head and cervical spine was performed following the standard protocol without intravenous contrast. Multiplanar CT image reconstructions of the cervical spine were also generated. COMPARISON:  Cervical spine radiograph performed 11/04/2017 FINDINGS: CT HEAD FINDINGS Brain: No evidence of acute infarction, hemorrhage, hydrocephalus, extra-axial collection or mass lesion / mass effect. Prominence of the ventricles and sulci reflects mild cortical volume loss. Scattered periventricular and subcortical white matter change likely reflects small vessel ischemic microangiopathy. Mild cerebellar atrophy is noted. The brainstem and fourth ventricle are within normal limits. The basal ganglia are unremarkable in appearance. The cerebral hemispheres demonstrate grossly normal gray-white differentiation. No mass effect or midline shift is seen. Vascular: No hyperdense vessel or unexpected calcification. Skull: There is no evidence of fracture; visualized osseous structures are unremarkable in appearance. Sinuses/Orbits: The orbits are within normal limits. There is opacification and mucoperiosteal thickening at the left side of  the sphenoid sinus. The remaining paranasal sinuses and mastoid air cells are well-aerated. Other: No significant soft tissue abnormalities are seen. CT CERVICAL SPINE FINDINGS Alignment: Normal. Skull base and vertebrae: No acute fracture. No primary bone lesion or focal pathologic process. Soft tissues and spinal canal: No prevertebral fluid or swelling. No visible canal hematoma. Disc levels: There is mild intervertebral disc space narrowing at C4-C5. Intervertebral disc spaces are grossly preserved. Upper chest: The visualized lung apices are clear. The thyroid gland is unremarkable. Calcification is seen at the carotid bifurcations bilaterally. Other: No additional soft tissue abnormalities are seen. IMPRESSION: 1. No evidence of traumatic intracranial injury or fracture. 2. No evidence of fracture or subluxation along the cervical spine. 3. Mild cortical volume loss and scattered small vessel ischemic microangiopathy. 4. Opacification and mucoperiosteal thickening at the left side of the sphenoid sinus. 5. Calcification at the carotid bifurcations bilaterally. Carotid ultrasound would be helpful for further evaluation, when and as deemed clinically appropriate. Electronically Signed   By: Garald Balding M.D.   On: 01/21/2018 21:51   Ct Cervical Spine Wo Contrast  Result Date: 01/21/2018 CLINICAL DATA:  Status post fall onto left side. Concern for head or cervical spine injury. Initial encounter. EXAM: CT HEAD WITHOUT CONTRAST CT CERVICAL SPINE WITHOUT CONTRAST TECHNIQUE: Multidetector CT imaging of the head and cervical spine was performed following the standard protocol without intravenous contrast. Multiplanar CT image reconstructions of the cervical spine were also generated. COMPARISON:  Cervical spine radiograph performed 11/04/2017 FINDINGS: CT HEAD FINDINGS Brain: No evidence of acute infarction, hemorrhage, hydrocephalus, extra-axial collection or mass lesion / mass effect. Prominence of the  ventricles and sulci reflects mild cortical volume loss. Scattered periventricular and subcortical white matter change likely reflects small vessel ischemic microangiopathy. Mild cerebellar atrophy is noted. The brainstem and fourth ventricle are within normal limits. The basal ganglia are unremarkable in appearance. The cerebral hemispheres demonstrate grossly normal gray-white differentiation. No mass effect or midline shift is seen. Vascular: No hyperdense vessel or unexpected calcification. Skull: There is no evidence of fracture; visualized osseous structures are unremarkable in appearance. Sinuses/Orbits: The orbits are within normal limits. There is opacification and mucoperiosteal thickening at the left side of the sphenoid sinus. The remaining paranasal sinuses and mastoid air cells are well-aerated. Other: No significant soft tissue abnormalities are seen. CT CERVICAL SPINE FINDINGS Alignment: Normal. Skull base and vertebrae: No acute fracture. No primary bone lesion or focal pathologic process. Soft tissues and  spinal canal: No prevertebral fluid or swelling. No visible canal hematoma. Disc levels: There is mild intervertebral disc space narrowing at C4-C5. Intervertebral disc spaces are grossly preserved. Upper chest: The visualized lung apices are clear. The thyroid gland is unremarkable. Calcification is seen at the carotid bifurcations bilaterally. Other: No additional soft tissue abnormalities are seen. IMPRESSION: 1. No evidence of traumatic intracranial injury or fracture. 2. No evidence of fracture or subluxation along the cervical spine. 3. Mild cortical volume loss and scattered small vessel ischemic microangiopathy. 4. Opacification and mucoperiosteal thickening at the left side of the sphenoid sinus. 5. Calcification at the carotid bifurcations bilaterally. Carotid ultrasound would be helpful for further evaluation, when and as deemed clinically appropriate. Electronically Signed   By:  Garald Balding M.D.   On: 01/21/2018 21:51   Dg C-arm 1-60 Min  Result Date: 01/22/2018 CLINICAL DATA:  ORIF intertrochanteric fracture. EXAM: LEFT FEMUR 2 VIEWS; DG C-ARM 61-120 MIN COMPARISON:  01/21/2018 FINDINGS: ORIF of intertrochanteric fracture. Components appear well positioned. Anatomic alignment. No radiographically detectable complication. IMPRESSION: Good appearance following ORIF. Electronically Signed   By: Nelson Chimes M.D.   On: 01/22/2018 12:26   Dg Hip Unilat W Or Wo Pelvis 2-3 Views Left  Result Date: 01/21/2018 CLINICAL DATA:  Left hip pain after fall today. EXAM: DG HIP (WITH OR WITHOUT PELVIS) 2-3V LEFT COMPARISON:  None. FINDINGS: Moderately displaced and comminuted fracture is seen involving the intertrochanteric portion of the proximal left femur. No other abnormality is noted. IMPRESSION: Moderately displaced and comminuted intertrochanteric fracture of proximal left femur. Electronically Signed   By: Marijo Conception, M.D.   On: 01/21/2018 21:33   Dg Femur Min 2 Views Left  Result Date: 01/22/2018 CLINICAL DATA:  ORIF intertrochanteric fracture. EXAM: LEFT FEMUR 2 VIEWS; DG C-ARM 61-120 MIN COMPARISON:  01/21/2018 FINDINGS: ORIF of intertrochanteric fracture. Components appear well positioned. Anatomic alignment. No radiographically detectable complication. IMPRESSION: Good appearance following ORIF. Electronically Signed   By: Nelson Chimes M.D.   On: 01/22/2018 12:26   Vas US Carotid  Result Date: 01/23/2018 Carotid Arterial Duplex Study Indications: Calcification. Performing Technologist: Abram Sander  Examination Guidelines: A complete evaluation includes B-mode imaging, spectral Doppler, color Doppler, and power Doppler as needed of all accessible portions of each vessel. Bilateral testing is considered an integral part of a complete examination. Limited examinations for reoccurring indications may be performed as noted.  Right Carotid Findings:  +----------+--------+--------+--------+-----------+--------+           PSV cm/sEDV cm/sStenosisDescribe   Comments +----------+--------+--------+--------+-----------+--------+ CCA Prox  94      11              homogeneous         +----------+--------+--------+--------+-----------+--------+ CCA Distal93      14              homogeneous         +----------+--------+--------+--------+-----------+--------+ ICA Prox  84      21      1-39%   homogeneous         +----------+--------+--------+--------+-----------+--------+ ICA Distal129     33                                  +----------+--------+--------+--------+-----------+--------+ ECA       147     18                                  +----------+--------+--------+--------+-----------+--------+ +----------+--------+-------+--------+-------------------+  PSV cm/sEDV cmsDescribeArm Pressure (mmHG) +----------+--------+-------+--------+-------------------+ Subclavian113                                        +----------+--------+-------+--------+-------------------+ +---------+--------+--+--------+--+---------+ VertebralPSV cm/s64EDV cm/s16Antegrade +---------+--------+--+--------+--+---------+  Left Carotid Findings: +----------+--------+--------+--------+-----------+--------+           PSV cm/sEDV cm/sStenosisDescribe   Comments +----------+--------+--------+--------+-----------+--------+ CCA Prox  138     21              homogeneous         +----------+--------+--------+--------+-----------+--------+ CCA Distal108     18              homogeneous         +----------+--------+--------+--------+-----------+--------+ ICA Prox  155     30      1-39%   homogeneous         +----------+--------+--------+--------+-----------+--------+ ICA Distal77      19                                  +----------+--------+--------+--------+-----------+--------+ ECA       132     12                                   +----------+--------+--------+--------+-----------+--------+ +----------+--------+--------+--------+-------------------+ SubclavianPSV cm/sEDV cm/sDescribeArm Pressure (mmHG) +----------+--------+--------+--------+-------------------+           154                                         +----------+--------+--------+--------+-------------------+ +---------+--------+--+--------+--+---------+ VertebralPSV cm/s94EDV cm/s14Antegrade +---------+--------+--+--------+--+---------+  Summary: Right Carotid: Velocities in the right ICA are consistent with a 1-39% stenosis. Left Carotid: Velocities in the left ICA are consistent with a 1-39% stenosis. Vertebrals: Bilateral vertebral arteries demonstrate antegrade flow. *See table(s) above for measurements and observations.  Electronically signed by Antony Contras MD on 01/23/2018 at 2:53:49 PM.    Final      01/23/2018 carotid Doppler:  Summary: Right Carotid: Velocities in the right ICA are consistent with a 1-39% stenosis.  Left Carotid: Velocities in the left ICA are consistent with a 1-39% stenosis.  Vertebrals: Bilateral vertebral arteries demonstrate antegrade flow.   01/22/2018 ORIF of left intertrochanteric hip fracture    Subjective:   Discharge Exam: Vitals:   01/24/18 2017 01/25/18 0400  BP: (!) 135/50 (!) 129/53  Pulse: 88 82  Resp: 16 14  Temp: 98.9 F (37.2 C) 97.9 F (36.6 C)  SpO2: 94% 96%   Vitals:   01/24/18 1005 01/24/18 1814 01/24/18 2017 01/25/18 0400  BP: (!) 113/55 (!) 112/51 (!) 135/50 (!) 129/53  Pulse: 84 82 88 82  Resp: 18  16 14   Temp: 98.9 F (37.2 C) 99.1 F (37.3 C) 98.9 F (37.2 C) 97.9 F (36.6 C)  TempSrc: Oral Oral Oral Oral  SpO2: 100% 91% 94% 96%  Weight:      Height:       General exam: Appears calm and comfortable  Respiratory system: Clear to auscultation. Respiratory effort normal. Cardiovascular system: Regular rate and rhythm, no  murmurs  gastrointestinal system: Soft, nondistended, no rebound or guarding, plus bowel sounds. Central nervous system: Alert and oriented.  Grossly intact, moving all extremities Extremities:  Left lower extremity limited by pain but noted to have intact pulses Skin: Incision site is clean dry and intact Psychiatry: Judgement and insight appear normal. Mood & affect appropriate.    The results of significant diagnostics from this hospitalization (including imaging, microbiology, ancillary and laboratory) are listed below for reference.     Microbiology: Recent Results (from the past 240 hour(s))  Surgical pcr screen     Status: None   Collection Time: 01/22/18 12:24 AM  Result Value Ref Range Status   MRSA, PCR NEGATIVE NEGATIVE Final   Staphylococcus aureus NEGATIVE NEGATIVE Final    Comment: (NOTE) The Xpert SA Assay (FDA approved for NASAL specimens in patients 73 years of age and older), is one component of a comprehensive surveillance program. It is not intended to diagnose infection nor to guide or monitor treatment. Performed at [facility], [place] [address], [place], [place] [address]   Urine culture     Status: None   Collection Time: 01/22/18 12:53 AM  Result Value Ref Range Status   Specimen Description URINE, CATHETERIZED  Final   Special Requests NONE  Final   Culture   Final    NO GROWTH Performed at [facility], [address] [place], [place] [address]    Report Status 01/23/2018 FINAL  Final     Labs: BNP (last 3 results) No results for input(s): BNP in the last 8760 hours. Basic Metabolic Panel: Recent Labs  Lab 01/21/18 2047 01/22/18 0347 01/23/18 0226 01/24/18 0218  NA 140 140 136 137  K 4.1 3.8 3.9 3.7  CL 109 110 105 99  CO2 26 25 28  33*  GLUCOSE 138* 133* 135* 116*  BUN 11 12 11 13   CREATININE 0.73 0.59 0.65 0.85  CALCIUM 8.2* 8.1* 7.9* 8.0*   Liver Function Tests: Recent Labs  Lab 01/21/18 2239 01/22/18 0347  AST 47*   --   ALT 25  --   ALKPHOS 73  --   BILITOT 0.8  --   PROT 5.4*  --   ALBUMIN 3.3* 2.9*   No results for input(s): LIPASE, AMYLASE in the last 168 hours. No results for input(s): AMMONIA in the last 168 hours. CBC: Recent Labs  Lab 01/21/18 2047 01/22/18 0347 01/23/18 0226 01/24/18 0218 01/24/18 1822 01/25/18 0205  WBC 6.7 7.6 8.5 9.1  --  6.5  NEUTROABS 5.0  --   --   --   --   --   HGB 10.5* 8.8* 8.0* 7.6* 9.3* 8.7*  HCT 34.1* 28.3* 26.8* 25.2* 30.5* 28.6*  MCV 91.9 89.6 90.2 90.6  --  90.5  PLT 192 155 164 179  --  150   Cardiac Enzymes: No results for input(s): CKTOTAL, CKMB, CKMBINDEX, TROPONINI in the last 168 hours. BNP: Invalid input(s): POCBNP CBG: No results for input(s): GLUCAP in the last 168 hours. D-Dimer No results for input(s): DDIMER in the last 72 hours. Hgb A1c No results for input(s): HGBA1C in the last 72 hours. Lipid Profile No results for input(s): CHOL, HDL, LDLCALC, TRIG, CHOLHDL, LDLDIRECT in the last 72 hours. Thyroid function studies No results for input(s): TSH, T4TOTAL, T3FREE, THYROIDAB in the last 72 hours.  Invalid input(s): FREET3 Anemia work up No results for input(s): VITAMINB12, FOLATE, FERRITIN, TIBC, IRON, RETICCTPCT in the last 72 hours. Urinalysis    Component Value Date/Time   COLORURINE YELLOW 01/22/2018 0100   APPEARANCEUR CLEAR 01/22/2018 0100   LABSPEC 1.018 01/22/2018 0100   PHURINE  6.0 01/22/2018 0100   GLUCOSEU NEGATIVE 01/22/2018 0100   HGBUR NEGATIVE 01/22/2018 0100   BILIRUBINUR NEGATIVE 01/22/2018 0100   BILIRUBINUR Negative 11/04/2017 1223   KETONESUR NEGATIVE 01/22/2018 0100   PROTEINUR NEGATIVE 01/22/2018 0100   UROBILINOGEN 0.2 11/04/2017 1223   UROBILINOGEN 0.2 05/01/2011 1347   NITRITE NEGATIVE 01/22/2018 0100   LEUKOCYTESUR SMALL (A) 01/22/2018 0100   Sepsis Labs Invalid input(s): PROCALCITONIN,  WBC,  LACTICIDVEN Microbiology Recent Results (from the past 240 hour(s))  Surgical pcr screen      Status: None   Collection Time: 01/22/18 12:24 AM  Result Value Ref Range Status   MRSA, PCR NEGATIVE NEGATIVE Final   Staphylococcus aureus NEGATIVE NEGATIVE Final    Comment: (NOTE) The Xpert SA Assay (FDA approved for NASAL specimens in patients 52 years of age and older), is one component of a comprehensive surveillance program. It is not intended to diagnose infection nor to guide or monitor treatment. Performed at [facility], [place] [address], [place], [place] [address]   Urine culture     Status: None   Collection Time: 01/22/18 12:53 AM  Result Value Ref Range Status   Specimen Description URINE, CATHETERIZED  Final   Special Requests NONE  Final   Culture   Final    NO GROWTH Performed at [facility], [address], [place], [place] [address]    Report Status 01/23/2018 FINAL  Final     Time coordinating discharge: 24  SIGNED:   Cristy Folks, MD  [facility] 01/25/2018, 12:31 PM  If 7PM-7AM, please contact night-coverage [email or web address] Password [email or web address]

## 2018-01-25 NOTE — Progress Notes (Signed)
Pt resting in bed. Family at bedside. Pt and family were informed of pts pending transfer to [facility]. No acute distress noted. Denied pain at this time. IV removed and gauze dressing applied. Continue to monitor.

## 2018-01-25 NOTE — Progress Notes (Signed)
Report called to [facility]. Taken by nurse Jeral Fruit. Pt awaiting transport via [facility].

## 2018-01-25 NOTE — Progress Notes (Signed)
Patient will DC to: [facility] Anticipated DC date: 01/25/18 Family notified: Ne[facility] L[facility] Transport by: Corey Harold  Per MD patient ready for DC to [facility]. RN, patient, patient's family, and facility notified of DC. Discharge Summary sent to facility. RN given number for report [phone]. DC packet on chart. Ambulance transport requested for patient.  CSW signing off.  [place], [place]

## 2018-01-26 DIAGNOSIS — R2681 Unsteadiness on feet: Secondary | ICD-10-CM | POA: Diagnosis not present

## 2018-01-26 DIAGNOSIS — K7460 Unspecified cirrhosis of liver: Secondary | ICD-10-CM | POA: Diagnosis not present

## 2018-01-26 DIAGNOSIS — K219 Gastro-esophageal reflux disease without esophagitis: Secondary | ICD-10-CM | POA: Diagnosis not present

## 2018-01-26 DIAGNOSIS — M810 Age-related osteoporosis without current pathological fracture: Secondary | ICD-10-CM | POA: Diagnosis not present

## 2018-01-26 DIAGNOSIS — M25552 Pain in left hip: Secondary | ICD-10-CM | POA: Diagnosis not present

## 2018-01-26 DIAGNOSIS — Z9181 History of falling: Secondary | ICD-10-CM | POA: Diagnosis not present

## 2018-01-26 DIAGNOSIS — S72302D Unspecified fracture of shaft of left femur, subsequent encounter for closed fracture with routine healing: Secondary | ICD-10-CM | POA: Diagnosis not present

## 2018-01-27 DIAGNOSIS — S72302D Unspecified fracture of shaft of left femur, subsequent encounter for closed fracture with routine healing: Secondary | ICD-10-CM | POA: Diagnosis not present

## 2018-01-27 DIAGNOSIS — R05 Cough: Secondary | ICD-10-CM | POA: Diagnosis not present

## 2018-01-30 DIAGNOSIS — M25552 Pain in left hip: Secondary | ICD-10-CM | POA: Diagnosis not present

## 2018-01-30 DIAGNOSIS — R2681 Unsteadiness on feet: Secondary | ICD-10-CM | POA: Diagnosis not present

## 2018-01-30 DIAGNOSIS — Z9181 History of falling: Secondary | ICD-10-CM | POA: Diagnosis not present

## 2018-01-31 DIAGNOSIS — R05 Cough: Secondary | ICD-10-CM | POA: Diagnosis not present

## 2018-01-31 DIAGNOSIS — K219 Gastro-esophageal reflux disease without esophagitis: Secondary | ICD-10-CM | POA: Diagnosis not present

## 2018-01-31 DIAGNOSIS — S72302D Unspecified fracture of shaft of left femur, subsequent encounter for closed fracture with routine healing: Secondary | ICD-10-CM | POA: Diagnosis not present

## 2018-02-02 DIAGNOSIS — R7981 Abnormal blood-gas level: Secondary | ICD-10-CM | POA: Diagnosis not present

## 2018-02-02 DIAGNOSIS — R05 Cough: Secondary | ICD-10-CM | POA: Diagnosis not present

## 2018-02-02 DIAGNOSIS — K219 Gastro-esophageal reflux disease without esophagitis: Secondary | ICD-10-CM | POA: Diagnosis not present

## 2018-02-03 DIAGNOSIS — Z7722 Contact with and (suspected) exposure to environmental tobacco smoke (acute) (chronic): Secondary | ICD-10-CM | POA: Diagnosis not present

## 2018-02-03 DIAGNOSIS — R05 Cough: Secondary | ICD-10-CM | POA: Diagnosis not present

## 2018-02-03 DIAGNOSIS — R9389 Abnormal findings on diagnostic imaging of other specified body structures: Secondary | ICD-10-CM | POA: Diagnosis not present

## 2018-02-03 DIAGNOSIS — R110 Nausea: Secondary | ICD-10-CM | POA: Diagnosis not present

## 2018-02-06 DIAGNOSIS — R05 Cough: Secondary | ICD-10-CM | POA: Diagnosis not present

## 2018-02-06 DIAGNOSIS — G479 Sleep disorder, unspecified: Secondary | ICD-10-CM | POA: Diagnosis not present

## 2018-02-06 DIAGNOSIS — R110 Nausea: Secondary | ICD-10-CM | POA: Diagnosis not present

## 2018-02-06 DIAGNOSIS — K219 Gastro-esophageal reflux disease without esophagitis: Secondary | ICD-10-CM | POA: Diagnosis not present

## 2018-02-07 ENCOUNTER — Ambulatory Visit (INDEPENDENT_AMBULATORY_CARE_PROVIDER_SITE_OTHER): Payer: Self-pay

## 2018-02-07 ENCOUNTER — Ambulatory Visit (INDEPENDENT_AMBULATORY_CARE_PROVIDER_SITE_OTHER): Payer: Medicare Other

## 2018-02-07 ENCOUNTER — Encounter (INDEPENDENT_AMBULATORY_CARE_PROVIDER_SITE_OTHER): Payer: Self-pay | Admitting: Orthopaedic Surgery

## 2018-02-07 ENCOUNTER — Ambulatory Visit (INDEPENDENT_AMBULATORY_CARE_PROVIDER_SITE_OTHER): Payer: Medicare Other | Admitting: Orthopaedic Surgery

## 2018-02-07 DIAGNOSIS — S72142A Displaced intertrochanteric fracture of left femur, initial encounter for closed fracture: Secondary | ICD-10-CM | POA: Diagnosis not present

## 2018-02-07 NOTE — Progress Notes (Signed)
Office Visit Note   Patient: Brianna Barnes           Date of Birth: Oct 28, 1928           MRN: [id number] Visit Date: 02/07/2018              Requested by: Jinny Sanders, MD [place], [place] [id number] PCP: Jinny Sanders, MD   Assessment & Plan: Visit Diagnoses:  1. Closed fracture of femur, intertrochanteric, left, initial encounter ([place])     Plan: Staples removed Steri-Strips applied.  She is weightbearing as tolerated on the left side.  She will continue work with therapy for gait balance overall strengthening.  Follow-up in 1 month with repeat AP and lateral views of the left femur.  She can go on back on her regular regimen of 81 mg aspirin 3 times a week at this point time.  Follow-Up Instructions: Return in about 4 weeks (around 03/07/2018) for post op.   Orders:  Orders Placed This Encounter  Procedures  . XR FEMUR MIN 2 VIEWS LEFT   No orders of the defined types were placed in this encounter.     Procedures: No procedures performed   Clinical Data: No additional findings.   Subjective: Chief Complaint  Patient presents with  . Left Hip - Routine Post Op    HPI Brianna Barnes is overall doing well.  She is 2 weeks status post open reduction internal fixation of the left hip intertrochanteric fracture.  [facility] is at [facility]  She is complaining mostly of discomfort in her left knee.  She is walking with a walker.  She is had no chest pain shortness breath fevers chills. Review of Systems Please see HPI  Objective: Vital Signs: There were no vitals taken for this visit.  Physical Exam  Constitutional: She is oriented to person, place, and time. She appears well-developed and well-nourished. No distress.  Pulmonary/Chest: Effort normal.  Neurological: She is alert and oriented to person, place, and time.  Skin: She is not diaphoretic.  Psychiatric: She has a normal mood and affect.    Ortho Exam Surgical incisions left hip well  approximated with staples no signs of infection.  She has significant bruising distal left.  Left calf supple slight tenderness.  Dorsiflexion plantarflexion ankle intact.  Sensation grossly intact throughout the foot to light touch. Specialty Comments:  No specialty comments available.  Imaging: Xr Femur Min 2 Views Left  Result Date: 02/07/2018 Left femur 2 views: Left hip fracture overall good position alignment.  Status post IM nailing with compression screw.  Otherwise no acute fractures.  No hardware failure.  Left hip well located.    PMFS History: Patient Active Problem List   Diagnosis Date Noted  . Closed fracture of femur, intertrochanteric, left, initial encounter ([place]) 01/21/2018  . Dizzinesses 01/21/2018  . Carotid artery calcification, bilateral 01/21/2018  . S/p left hip fracture 01/21/2018  . Bacteriuria, asymptomatic 11/04/2017  . Acute neck pain 11/04/2017  . Fatigue 11/04/2017  . Situational anxiety 11/04/2017  . Right foot pain 03/22/2017  . Right ankle pain 02/25/2017  . Chronic insomnia 04/03/2016  . Mild malnutrition ([place]) 01/30/2016  . BPPV (benign paroxysmal positional vertigo) 01/30/2016  . Counseling regarding end of life decision making 01/25/2014  . Choledocholithiasis 11/30/2012  . Cryptogenic cirrhosis ([place]) 11/22/2012  . Family history of early CAD 10/29/2010  . Vitamin D deficiency 06/24/2009  . Essential hypertension, benign 06/13/2009  . Hyperlipidemia  06/06/2009  . Iron deficiency anemia 09/17/2008  . GERD 09/17/2008  . PEPTIC ULCER DISEASE 09/17/2008  . UTEROVAGINAL PROLAPSE, INCOMPLETE 09/17/2008  . OSTEOARTHRITIS 09/17/2008  . Osteoporosis 09/17/2008  . ARTERIOVENOUS MALFORMATION 09/17/2008  . POSITIVE TB SKIN TEST, WITHOUT TUBERCULOSIS 09/17/2008  . COLONIC POLYPS, BENIGN, HX OF 09/17/2008   Past Medical History:  Diagnosis Date  . Choledocholithiasis   . Cirrhosis, cryptogenic (HCC)    FOLLOWED BY DR PYRTLE-- LOV  IN EPIC--   STABLE PER NOTE  . Cryptogenic cirrhosis ([place])   . Esophageal varix ([place])   . GERD (gastroesophageal reflux disease)   . History of colonoscopy with polypectomy    ADENOMATOUS AND HYPERPLASIA POLYPS  (2008  &  2010)  . History of hepatitis C   . History of positive PPD    + TB SKIN TEST WITHOUT TB  . Hyperlipidemia   . Hypertension, portal ([place])    SECONDARY TO CRYPTOGENIC CIRRHOSIS  . Internal hemorrhoids   . Iron deficiency anemia   . Loss of hearing    right ear  . Osteoarthritis    hands, knees, and low back  . Osteoporosis   . PUD (peptic ulcer disease)   . Tubular adenoma of colon   . Uterovaginal prolapse, incomplete   . Wears glasses   . Wears hearing aid    left ear only    Family History  Problem Relation Age of Onset  . Heart disease Mother   . Heart disease Father 43  . Hyperlipidemia Father   . Diabetes Father   . Cirrhosis Sister   . Diabetes Brother   . Heart disease Brother     Past Surgical History:  Procedure Laterality Date  . APPENDECTOMY  1960'S  . CHOLECYSTECTOMY  08/25/2011   Procedure: LAPAROSCOPIC CHOLECYSTECTOMY WITH INTRAOPERATIVE CHOLANGIOGRAM;  Surgeon: Zenovia Jarred, MD;  Location: [facility];  Service: General;  Laterality: N/A;  . ENDOSCOPIC RETROGRADE CHOLANGIOPANCREATOGRAPHY (ERCP) WITH PROPOFOL N/A 11/30/2012   Procedure: ENDOSCOPIC RETROGRADE CHOLANGIOPANCREATOGRAPHY (ERCP) WITH PROPOFOL;  Surgeon: Milus Banister, MD;  [facility];  Service: Endoscopy;  Laterality: N/A;  . EXTERNAL EAR SURGERY Left 1980  . HYSTEROSCOPY W/D&C N/A 07/15/2014   Procedure: DILATATION AND CURETTAGE /HYSTEROSCOPY;  Surgeon: Molli Posey, MD;  Location[facility];  Service: Gynecology;  Laterality: N/A;  . INTRAMEDULLARY (IM) NAIL INTERTROCHANTERIC Left 01/22/2018   Procedure: ORIF LEFT INTERTROCHANTRIC HIP FX;  Surgeon: Mcarthur Rossetti, MD;  Location: [place];  Service: Orthopedics;  Laterality: Left;  . KNEE ARTHROSCOPY W/  MENISCECTOMY Right 06-19-2002  . [place] SURGERY  1990  . REMOVAL OF URINARY SLING N/A 09/13/2013   Procedure: REMOVAL OF RETAINED PESSARY;  Surgeon: Jorja Loa, MD;  Location: [facility];  Service: Urology;  Laterality: N/A;   Social History   Occupational History  . Occupation: Retired, Licensed conveyancer  Tobacco Use  . Smoking status: Never Smoker  . Smokeless tobacco: Never Used  Substance and Sexual Activity  . Alcohol use: No  . Drug use: No  . Sexual activity: Not on file

## 2018-02-08 DIAGNOSIS — R9389 Abnormal findings on diagnostic imaging of other specified body structures: Secondary | ICD-10-CM | POA: Diagnosis not present

## 2018-02-08 DIAGNOSIS — S72302D Unspecified fracture of shaft of left femur, subsequent encounter for closed fracture with routine healing: Secondary | ICD-10-CM | POA: Diagnosis not present

## 2018-02-08 DIAGNOSIS — G479 Sleep disorder, unspecified: Secondary | ICD-10-CM | POA: Diagnosis not present

## 2018-02-13 DIAGNOSIS — K219 Gastro-esophageal reflux disease without esophagitis: Secondary | ICD-10-CM | POA: Diagnosis not present

## 2018-02-13 DIAGNOSIS — D649 Anemia, unspecified: Secondary | ICD-10-CM | POA: Diagnosis not present

## 2018-02-13 DIAGNOSIS — S72302D Unspecified fracture of shaft of left femur, subsequent encounter for closed fracture with routine healing: Secondary | ICD-10-CM | POA: Diagnosis not present

## 2018-02-13 DIAGNOSIS — K7460 Unspecified cirrhosis of liver: Secondary | ICD-10-CM | POA: Diagnosis not present

## 2018-02-15 ENCOUNTER — Other Ambulatory Visit: Payer: Self-pay | Admitting: *Deleted

## 2018-02-15 DIAGNOSIS — E785 Hyperlipidemia, unspecified: Secondary | ICD-10-CM | POA: Diagnosis not present

## 2018-02-15 DIAGNOSIS — K5900 Constipation, unspecified: Secondary | ICD-10-CM | POA: Diagnosis not present

## 2018-02-15 DIAGNOSIS — E560 Deficiency of vitamin E: Secondary | ICD-10-CM | POA: Diagnosis not present

## 2018-02-15 DIAGNOSIS — E559 Vitamin D deficiency, unspecified: Secondary | ICD-10-CM | POA: Diagnosis not present

## 2018-02-15 DIAGNOSIS — H04129 Dry eye syndrome of unspecified lacrimal gland: Secondary | ICD-10-CM | POA: Diagnosis not present

## 2018-02-15 DIAGNOSIS — S72012D Unspecified intracapsular fracture of left femur, subsequent encounter for closed fracture with routine healing: Secondary | ICD-10-CM | POA: Diagnosis not present

## 2018-02-15 DIAGNOSIS — Z9181 History of falling: Secondary | ICD-10-CM | POA: Diagnosis not present

## 2018-02-15 DIAGNOSIS — K219 Gastro-esophageal reflux disease without esophagitis: Secondary | ICD-10-CM | POA: Diagnosis not present

## 2018-02-15 DIAGNOSIS — H409 Unspecified glaucoma: Secondary | ICD-10-CM | POA: Diagnosis not present

## 2018-02-15 DIAGNOSIS — M810 Age-related osteoporosis without current pathological fracture: Secondary | ICD-10-CM | POA: Diagnosis not present

## 2018-02-15 DIAGNOSIS — D509 Iron deficiency anemia, unspecified: Secondary | ICD-10-CM | POA: Diagnosis not present

## 2018-02-15 DIAGNOSIS — I10 Essential (primary) hypertension: Secondary | ICD-10-CM | POA: Diagnosis not present

## 2018-02-15 DIAGNOSIS — K7469 Other cirrhosis of liver: Secondary | ICD-10-CM | POA: Diagnosis not present

## 2018-02-15 DIAGNOSIS — I6523 Occlusion and stenosis of bilateral carotid arteries: Secondary | ICD-10-CM | POA: Diagnosis not present

## 2018-02-15 MED ORDER — PANTOPRAZOLE SODIUM 40 MG PO TBEC: 40.0000 mg | DELAYED_RELEASE_TABLET | Freq: Every day | ORAL | 1 refills | Status: DC

## 2018-02-15 NOTE — Telephone Encounter (Signed)
Last office visit 11/04/2017 for multiple issues.  Last refilled 11/04/2017 for #30 with no refills. CPE scheduled for 03/03/2018.  Ok to refill?

## 2018-02-16 DIAGNOSIS — E785 Hyperlipidemia, unspecified: Secondary | ICD-10-CM | POA: Diagnosis not present

## 2018-02-16 DIAGNOSIS — K7469 Other cirrhosis of liver: Secondary | ICD-10-CM | POA: Diagnosis not present

## 2018-02-16 DIAGNOSIS — M810 Age-related osteoporosis without current pathological fracture: Secondary | ICD-10-CM | POA: Diagnosis not present

## 2018-02-16 DIAGNOSIS — K219 Gastro-esophageal reflux disease without esophagitis: Secondary | ICD-10-CM | POA: Diagnosis not present

## 2018-02-16 DIAGNOSIS — K5900 Constipation, unspecified: Secondary | ICD-10-CM | POA: Diagnosis not present

## 2018-02-16 DIAGNOSIS — I10 Essential (primary) hypertension: Secondary | ICD-10-CM | POA: Diagnosis not present

## 2018-02-16 DIAGNOSIS — D509 Iron deficiency anemia, unspecified: Secondary | ICD-10-CM | POA: Diagnosis not present

## 2018-02-16 DIAGNOSIS — E559 Vitamin D deficiency, unspecified: Secondary | ICD-10-CM | POA: Diagnosis not present

## 2018-02-16 DIAGNOSIS — E560 Deficiency of vitamin E: Secondary | ICD-10-CM | POA: Diagnosis not present

## 2018-02-16 DIAGNOSIS — H409 Unspecified glaucoma: Secondary | ICD-10-CM | POA: Diagnosis not present

## 2018-02-16 DIAGNOSIS — I6523 Occlusion and stenosis of bilateral carotid arteries: Secondary | ICD-10-CM | POA: Diagnosis not present

## 2018-02-16 DIAGNOSIS — S72012D Unspecified intracapsular fracture of left femur, subsequent encounter for closed fracture with routine healing: Secondary | ICD-10-CM | POA: Diagnosis not present

## 2018-02-16 DIAGNOSIS — Z9181 History of falling: Secondary | ICD-10-CM | POA: Diagnosis not present

## 2018-02-16 DIAGNOSIS — H04129 Dry eye syndrome of unspecified lacrimal gland: Secondary | ICD-10-CM | POA: Diagnosis not present

## 2018-02-16 MED ORDER — TRAMADOL HCL 50 MG PO TABS: ORAL_TABLET | ORAL | 0 refills | Status: DC

## 2018-02-17 ENCOUNTER — Telehealth: Payer: Self-pay | Admitting: Family Medicine

## 2018-02-17 DIAGNOSIS — E560 Deficiency of vitamin E: Secondary | ICD-10-CM | POA: Diagnosis not present

## 2018-02-17 DIAGNOSIS — S72012D Unspecified intracapsular fracture of left femur, subsequent encounter for closed fracture with routine healing: Secondary | ICD-10-CM | POA: Diagnosis not present

## 2018-02-17 DIAGNOSIS — H409 Unspecified glaucoma: Secondary | ICD-10-CM | POA: Diagnosis not present

## 2018-02-17 DIAGNOSIS — E785 Hyperlipidemia, unspecified: Secondary | ICD-10-CM | POA: Diagnosis not present

## 2018-02-17 DIAGNOSIS — K219 Gastro-esophageal reflux disease without esophagitis: Secondary | ICD-10-CM | POA: Diagnosis not present

## 2018-02-17 DIAGNOSIS — E559 Vitamin D deficiency, unspecified: Secondary | ICD-10-CM | POA: Diagnosis not present

## 2018-02-17 DIAGNOSIS — I10 Essential (primary) hypertension: Secondary | ICD-10-CM | POA: Diagnosis not present

## 2018-02-17 DIAGNOSIS — D509 Iron deficiency anemia, unspecified: Secondary | ICD-10-CM | POA: Diagnosis not present

## 2018-02-17 DIAGNOSIS — K5900 Constipation, unspecified: Secondary | ICD-10-CM | POA: Diagnosis not present

## 2018-02-17 DIAGNOSIS — Z9181 History of falling: Secondary | ICD-10-CM | POA: Diagnosis not present

## 2018-02-17 DIAGNOSIS — I6523 Occlusion and stenosis of bilateral carotid arteries: Secondary | ICD-10-CM | POA: Diagnosis not present

## 2018-02-17 DIAGNOSIS — H04129 Dry eye syndrome of unspecified lacrimal gland: Secondary | ICD-10-CM | POA: Diagnosis not present

## 2018-02-17 DIAGNOSIS — K7469 Other cirrhosis of liver: Secondary | ICD-10-CM | POA: Diagnosis not present

## 2018-02-17 DIAGNOSIS — M810 Age-related osteoporosis without current pathological fracture: Secondary | ICD-10-CM | POA: Diagnosis not present

## 2018-02-17 NOTE — Telephone Encounter (Signed)
Fieldon call requesting verbal orders for OT 2 times a week for 3 weeks. Best cb # is [phone].

## 2018-02-21 DIAGNOSIS — E785 Hyperlipidemia, unspecified: Secondary | ICD-10-CM | POA: Diagnosis not present

## 2018-02-21 DIAGNOSIS — H04129 Dry eye syndrome of unspecified lacrimal gland: Secondary | ICD-10-CM | POA: Diagnosis not present

## 2018-02-21 DIAGNOSIS — M810 Age-related osteoporosis without current pathological fracture: Secondary | ICD-10-CM | POA: Diagnosis not present

## 2018-02-21 DIAGNOSIS — K5900 Constipation, unspecified: Secondary | ICD-10-CM | POA: Diagnosis not present

## 2018-02-21 DIAGNOSIS — K7469 Other cirrhosis of liver: Secondary | ICD-10-CM | POA: Diagnosis not present

## 2018-02-21 DIAGNOSIS — I10 Essential (primary) hypertension: Secondary | ICD-10-CM | POA: Diagnosis not present

## 2018-02-21 DIAGNOSIS — I6523 Occlusion and stenosis of bilateral carotid arteries: Secondary | ICD-10-CM | POA: Diagnosis not present

## 2018-02-21 DIAGNOSIS — Z9181 History of falling: Secondary | ICD-10-CM | POA: Diagnosis not present

## 2018-02-21 DIAGNOSIS — K219 Gastro-esophageal reflux disease without esophagitis: Secondary | ICD-10-CM | POA: Diagnosis not present

## 2018-02-21 DIAGNOSIS — E559 Vitamin D deficiency, unspecified: Secondary | ICD-10-CM | POA: Diagnosis not present

## 2018-02-21 DIAGNOSIS — H409 Unspecified glaucoma: Secondary | ICD-10-CM | POA: Diagnosis not present

## 2018-02-21 DIAGNOSIS — E560 Deficiency of vitamin E: Secondary | ICD-10-CM | POA: Diagnosis not present

## 2018-02-21 DIAGNOSIS — D509 Iron deficiency anemia, unspecified: Secondary | ICD-10-CM | POA: Diagnosis not present

## 2018-02-21 DIAGNOSIS — S72012D Unspecified intracapsular fracture of left femur, subsequent encounter for closed fracture with routine healing: Secondary | ICD-10-CM | POA: Diagnosis not present

## 2018-02-21 NOTE — Telephone Encounter (Signed)
Okay to give verbal as requested. 

## 2018-02-21 NOTE — Telephone Encounter (Signed)
Left message for Estill Bamberg giving verbal order for OT 2 x a week for 3 weeks per Dr. Diona Browner.

## 2018-02-23 DIAGNOSIS — I6523 Occlusion and stenosis of bilateral carotid arteries: Secondary | ICD-10-CM | POA: Diagnosis not present

## 2018-02-23 DIAGNOSIS — K5900 Constipation, unspecified: Secondary | ICD-10-CM | POA: Diagnosis not present

## 2018-02-23 DIAGNOSIS — K219 Gastro-esophageal reflux disease without esophagitis: Secondary | ICD-10-CM | POA: Diagnosis not present

## 2018-02-23 DIAGNOSIS — M810 Age-related osteoporosis without current pathological fracture: Secondary | ICD-10-CM | POA: Diagnosis not present

## 2018-02-23 DIAGNOSIS — Z9181 History of falling: Secondary | ICD-10-CM | POA: Diagnosis not present

## 2018-02-23 DIAGNOSIS — E559 Vitamin D deficiency, unspecified: Secondary | ICD-10-CM | POA: Diagnosis not present

## 2018-02-23 DIAGNOSIS — I10 Essential (primary) hypertension: Secondary | ICD-10-CM | POA: Diagnosis not present

## 2018-02-23 DIAGNOSIS — D509 Iron deficiency anemia, unspecified: Secondary | ICD-10-CM | POA: Diagnosis not present

## 2018-02-23 DIAGNOSIS — H409 Unspecified glaucoma: Secondary | ICD-10-CM | POA: Diagnosis not present

## 2018-02-23 DIAGNOSIS — S72012D Unspecified intracapsular fracture of left femur, subsequent encounter for closed fracture with routine healing: Secondary | ICD-10-CM | POA: Diagnosis not present

## 2018-02-23 DIAGNOSIS — E560 Deficiency of vitamin E: Secondary | ICD-10-CM | POA: Diagnosis not present

## 2018-02-23 DIAGNOSIS — E785 Hyperlipidemia, unspecified: Secondary | ICD-10-CM | POA: Diagnosis not present

## 2018-02-23 DIAGNOSIS — H04129 Dry eye syndrome of unspecified lacrimal gland: Secondary | ICD-10-CM | POA: Diagnosis not present

## 2018-02-23 DIAGNOSIS — K7469 Other cirrhosis of liver: Secondary | ICD-10-CM | POA: Diagnosis not present

## 2018-02-24 ENCOUNTER — Ambulatory Visit
Admission: RE | Admit: 2018-02-24 | Discharge: 2018-02-24 | Disposition: A | Discharge status: Home / Self Care | Payer: Medicare Other | Source: Ambulatory Visit | Attending: Family Medicine | Admitting: Family Medicine

## 2018-02-24 DIAGNOSIS — Z1231 Encounter for screening mammogram for malignant neoplasm of breast: Secondary | ICD-10-CM | POA: Diagnosis not present

## 2018-02-27 ENCOUNTER — Other Ambulatory Visit: Payer: Self-pay | Admitting: Family Medicine

## 2018-02-27 ENCOUNTER — Telehealth: Payer: Self-pay | Admitting: Family Medicine

## 2018-02-27 ENCOUNTER — Other Ambulatory Visit: Payer: Self-pay | Admitting: Specialist

## 2018-02-27 ENCOUNTER — Ambulatory Visit
Admission: RE | Admit: 2018-02-27 | Discharge: 2018-02-27 | Disposition: A | Discharge status: Home / Self Care | Payer: Medicare Other | Source: Ambulatory Visit | Attending: Specialist | Admitting: Specialist

## 2018-02-27 DIAGNOSIS — M7989 Other specified soft tissue disorders: Secondary | ICD-10-CM | POA: Diagnosis not present

## 2018-02-27 DIAGNOSIS — E782 Mixed hyperlipidemia: Secondary | ICD-10-CM

## 2018-02-27 DIAGNOSIS — D509 Iron deficiency anemia, unspecified: Secondary | ICD-10-CM

## 2018-02-27 DIAGNOSIS — M79605 Pain in left leg: Secondary | ICD-10-CM | POA: Insufficient documentation

## 2018-02-27 DIAGNOSIS — R609 Edema, unspecified: Secondary | ICD-10-CM

## 2018-02-27 DIAGNOSIS — I82402 Acute embolism and thrombosis of unspecified deep veins of left lower extremity: Secondary | ICD-10-CM | POA: Diagnosis not present

## 2018-02-27 DIAGNOSIS — R0609 Other forms of dyspnea: Secondary | ICD-10-CM | POA: Insufficient documentation

## 2018-02-27 DIAGNOSIS — I824Z2 Acute embolism and thrombosis of unspecified deep veins of left distal lower extremity: Secondary | ICD-10-CM | POA: Diagnosis not present

## 2018-02-27 DIAGNOSIS — E559 Vitamin D deficiency, unspecified: Secondary | ICD-10-CM

## 2018-02-27 DIAGNOSIS — D638 Anemia in other chronic diseases classified elsewhere: Secondary | ICD-10-CM | POA: Diagnosis not present

## 2018-02-27 NOTE — Telephone Encounter (Signed)
-----   Message from Eustace Pen, LPN sent at [id number]/17/4715  3:18 PM EST ----- Regarding: Labs 12/17 Lab orders needed. Thank you.  Insurance:  [facility] Medicare

## 2018-02-28 ENCOUNTER — Ambulatory Visit: Payer: Medicare Other

## 2018-03-03 ENCOUNTER — Encounter: Payer: Medicare Other | Admitting: Family Medicine

## 2018-03-09 ENCOUNTER — Encounter (INDEPENDENT_AMBULATORY_CARE_PROVIDER_SITE_OTHER): Payer: Self-pay | Admitting: Vascular Surgery

## 2018-03-09 ENCOUNTER — Ambulatory Visit (INDEPENDENT_AMBULATORY_CARE_PROVIDER_SITE_OTHER): Payer: Medicare Other | Admitting: Vascular Surgery

## 2018-03-09 VITALS — BP 133/67 | HR 78 | Resp 12 | Ht <= 58 in | Wt 107.0 lb

## 2018-03-09 DIAGNOSIS — I82412 Acute embolism and thrombosis of left femoral vein: Secondary | ICD-10-CM | POA: Diagnosis not present

## 2018-03-09 DIAGNOSIS — E782 Mixed hyperlipidemia: Secondary | ICD-10-CM | POA: Diagnosis not present

## 2018-03-09 DIAGNOSIS — S72142A Displaced intertrochanteric fracture of left femur, initial encounter for closed fracture: Secondary | ICD-10-CM

## 2018-03-09 DIAGNOSIS — I10 Essential (primary) hypertension: Secondary | ICD-10-CM | POA: Diagnosis not present

## 2018-03-09 DIAGNOSIS — I82409 Acute embolism and thrombosis of unspecified deep veins of unspecified lower extremity: Secondary | ICD-10-CM | POA: Insufficient documentation

## 2018-03-09 DIAGNOSIS — Z86718 Personal history of other venous thrombosis and embolism: Secondary | ICD-10-CM | POA: Insufficient documentation

## 2018-03-09 MED ORDER — APIXABAN 5 MG PO TABS: 5.0000 mg | ORAL_TABLET | Freq: Two times a day (BID) | ORAL | 4 refills | Status: DC

## 2018-03-09 NOTE — Progress Notes (Signed)
MRN : [id number]  Brianna Barnes is a 84 y.o. (December 27, 1928) female who presents with chief complaint of  Chief Complaint  Patient presents with  . New Patient (Initial Visit)  .  History of Present Illness:   The patient presents to the office for evaluation of DVT.  DVT was identified at [facility] by Duplex ultrasound.  The initial symptoms were pain and swelling in the lower extremity.  The patient notes the leg continues to be very painful with dependency and swells quite a bite.  Symptoms are much better with elevation.  The patient notes minimal edema in the morning which steadily worsens throughout the day.    The patient has not been using compression therapy at this point.  No SOB or pleuritic chest pains.  No cough or hemoptysis.  No blood per rectum or blood in any sputum.  No excessive bruising per the patient.   Current Meds  Medication Sig  . alendronate (FOSAMAX) 70 MG tablet TAKE 1 TABLET BY MOUTH ONCE A WEEK, AS DIRECTED (Patient taking differently: Take 70 mg by mouth every Tuesday. )  . apixaban (ELIQUIS) 5 MG TABS tablet Take 5 mg by mouth 2 (two) times daily.  . Calcium-Phosphorus-Vitamin D (CALCIUM GUMMIES PO) Take 1 tablet by mouth daily.  Marland Kitchen dimenhyDRINATE (DRAMAMINE) 50 MG tablet Take 50 mg by mouth every 8 (eight) hours as needed for dizziness.  Marland Kitchen ibuprofen (ADVIL,MOTRIN) 200 MG tablet Take 400 mg by mouth daily as needed for headache or mild pain.   . Iron Combinations (IRON COMPLEX PO) Take 1 tablet by mouth daily.  Marland Kitchen lidocaine (LIDODERM) 5 % Place 1 patch onto the skin daily. Remove & Discard patch within 12 hours or as directed by MD  . pantoprazole (PROTONIX) 40 MG tablet Take 1 tablet (40 mg total) by mouth daily before breakfast.  . sennosides-docusate sodium (SENOKOT-S) 8.6-50 MG tablet Take 1 tablet by mouth daily as needed for constipation.  . simethicone (MYLICON) 106 MG chewable tablet Chew 125 mg by mouth every 6 (six) hours as needed for  flatulence.  . timolol (TIMOPTIC) 0.5 % ophthalmic solution Place 1 drop into both eyes daily.   . traMADol (ULTRAM) 50 MG tablet TAKE ONE-HALF TO ONE TABLET BY MOUTH UP TO EVERY 12 HOURS AS NEEDED FOR MODERATE PAIN.  . traZODone (DESYREL) 50 MG tablet TAKE 2 TABLETS BY MOUTH AT BEDTIME AS NEEDED FOR SLEEP (Patient taking differently: Take 50 mg by mouth at bedtime as needed for sleep. )  . vitamin E 400 UNIT capsule Take 400 Units by mouth daily.  . [DISCONTINUED] Cholecalciferol (VITAMIN D3) 400 UNITS CAPS Take 400 mg by mouth daily.     Past Medical History:  Diagnosis Date  . Choledocholithiasis   . Cirrhosis, cryptogenic (HCC)    FOLLOWED BY DR PYRTLE-- LOV  IN EPIC--  STABLE PER NOTE  . Cryptogenic cirrhosis ([place])   . Esophageal varix ([place])   . GERD (gastroesophageal reflux disease)   . History of colonoscopy with polypectomy    ADENOMATOUS AND HYPERPLASIA POLYPS  (2008  &  2010)  . History of hepatitis C   . History of positive PPD    + TB SKIN TEST WITHOUT TB  . Hyperlipidemia   . Hypertension, portal ([place])    SECONDARY TO CRYPTOGENIC CIRRHOSIS  . Internal hemorrhoids   . Iron deficiency anemia   . Loss of hearing    right ear  . Osteoarthritis    hands, knees,  and low back  . Osteoporosis   . PUD (peptic ulcer disease)   . Tubular adenoma of colon   . Uterovaginal prolapse, incomplete   . Wears glasses   . Wears hearing aid    left ear only    Past Surgical History:  Procedure Laterality Date  . APPENDECTOMY  1960'S  . CHOLECYSTECTOMY  08/25/2011   Procedure: LAPAROSCOPIC CHOLECYSTECTOMY WITH INTRAOPERATIVE CHOLANGIOGRAM;  Surgeon: Zenovia Jarred, MD;  Location: [place];  Service: General;  Laterality: N/A;  . ENDOSCOPIC RETROGRADE CHOLANGIOPANCREATOGRAPHY (ERCP) WITH PROPOFOL N/A 11/30/2012   Procedure: ENDOSCOPIC RETROGRADE CHOLANGIOPANCREATOGRAPHY (ERCP) WITH PROPOFOL;  Surgeon: Milus Banister, MD;  [facility];  Service: Endoscopy;  Laterality: N/A;   . EXTERNAL EAR SURGERY Left 1980  . HYSTEROSCOPY W/D&C N/A 07/15/2014   Procedure: DILATATION AND CURETTAGE /HYSTEROSCOPY;  Surgeon: Molli Posey, MD;  Location: [facility];  Service: Gynecology;  Laterality: N/A;  . INTRAMEDULLARY (IM) NAIL INTERTROCHANTERIC Left 01/22/2018   Procedure: ORIF LEFT INTERTROCHANTRIC HIP FX;  Surgeon: Mcarthur Rossetti, MD;  Location: [place];  Service: Orthopedics;  Laterality: Left;  . KNEE ARTHROSCOPY W/ MENISCECTOMY Right 06-19-2002  . [place] SURGERY  1990  . REMOVAL OF URINARY SLING N/A 09/13/2013   Procedure: REMOVAL OF RETAINED PESSARY;  Surgeon: Jorja Loa, MD;  Location: [facility];  Service: Urology;  Laterality: N/A;    Social History Social History   Tobacco Use  . Smoking status: Never Smoker  . Smokeless tobacco: Never Used  Substance Use Topics  . Alcohol use: No  . Drug use: No    Family History Family History  Problem Relation Age of Onset  . Heart disease Mother   . Heart disease Father 78  . Hyperlipidemia Father   . Diabetes Father   . Cirrhosis Sister   . Diabetes Brother   . Heart disease Brother   No family history of bleeding/clotting disorders, porphyria or autoimmune disease   No Known Allergies   REVIEW OF SYSTEMS (Negative unless checked)  Constitutional: [] Weight loss  [] Fever  [] Chills Cardiac: [] Chest pain   [] Chest pressure   [] Palpitations   [] Shortness of breath when laying flat   [] Shortness of breath with exertion. Vascular:  [] Pain in legs with walking   [] Pain in legs at rest  [x] History of DVT   [] Phlebitis   [x] Swelling in legs   [] Varicose veins   [] Non-healing ulcers Pulmonary:   [] Uses home oxygen   [] Productive cough   [] Hemoptysis   [] Wheeze  [] COPD   [] Asthma Neurologic:  [] Dizziness   [] Seizures   [] History of stroke   [] History of TIA  [] Aphasia   [] Vissual changes   [] Weakness or numbness in arm   [] Weakness or numbness in leg Musculoskeletal:    [] Joint swelling   [] Joint pain   [] Low back pain Hematologic:  [] Easy bruising  [] Easy bleeding   [] Hypercoagulable state   [] Anemic Gastrointestinal:  [] Diarrhea   [] Vomiting  [] Gastroesophageal reflux/heartburn   [] Difficulty swallowing. Genitourinary:  [] Chronic kidney disease   [] Difficult urination  [] Frequent urination   [] Blood in urine Skin:  [] Rashes   [] Ulcers  Psychological:  [] History of anxiety   []  History of major depression.  Physical Examination  Vitals:   03/09/18 1313  BP: 133/67  Pulse: 78  Resp: 12  Weight: 107 lb (48.5 kg)  Height: 4\' 10"  (1.473 m)   Body mass index is 22.36 kg/m. Gen: WD/WN, NAD Head: [place]/AT, No temporalis wasting.  Ear/Nose/Throat: Hearing grossly intact, nares w/o erythema or drainage, poor dentition Eyes: PER, EOMI, sclera nonicteric.  Neck: Supple, no masses.  No bruit or JVD.  Pulmonary:  Good air movement, clear to auscultation bilaterally, no use of accessory muscles.  Cardiac: RRR, normal S1, S2, no Murmurs. Vascular: scattered varicosities present bilaterally.  Mild venous stasis changes to the legs bilaterally.  2+ soft pitting edema Vessel Right Left  Radial Palpable Palpable  PT Palpable Palpable  DP Palpable Palpable   Gastrointestinal: soft, non-distended. No guarding/no peritoneal signs.  Musculoskeletal: M/S 5/5 throughout.  No deformity or atrophy.  Neurologic: CN 2-12 intact. Pain and light touch intact in extremities.  Symmetrical.  Speech is fluent. Motor exam as listed above. Psychiatric: Judgment intact, Mood & affect appropriate for pt's clinical situation. Dermatologic: No rashes or ulcers noted.  No changes consistent with cellulitis. Lymph : No Cervical lymphadenopathy, no lichenification or skin changes of chronic lymphedema.  CBC Lab Results  Component Value Date   WBC 6.5 01/25/2018   HGB 8.7 (L) 01/25/2018   HCT 28.6 (L) 01/25/2018   MCV 90.5 01/25/2018   PLT 150 01/25/2018    BMET    Component  Value Date/Time   NA 137 01/24/2018 0218   K 3.7 01/24/2018 0218   CL 99 01/24/2018 0218   CO2 33 (H) 01/24/2018 0218   GLUCOSE 116 (H) 01/24/2018 0218   BUN 13 01/24/2018 0218   CREATININE 0.85 01/24/2018 0218   CALCIUM 8.0 (L) 01/24/2018 0218   GFRNONAA 60 (L) 01/24/2018 0218   GFRAA >60 01/24/2018 0218   CrCl cannot be calculated (Patient's most recent lab result is older than the maximum 21 days allowed.).  COAG Lab Results  Component Value Date   INR 1.15 01/21/2018   INR 1.1 (H) 05/06/2015   INR 1.1 (H) 06/04/2014    Radiology US Venous Img Lower Unilateral Left  Result Date: 02/27/2018 CLINICAL DATA:  LEFT leg pain and swelling for 3 days post hip surgery, question deep venous thrombosis EXAM: LEFT LOWER EXTREMITY VENOUS DOPPLER ULTRASOUND TECHNIQUE: Gray-scale sonography with graded compression, as well as color Doppler and duplex ultrasound were performed to evaluate the lower extremity deep venous systems from the level of the common femoral vein and including the common femoral, femoral, profunda femoral, popliteal and calf veins including the posterior tibial, peroneal and gastrocnemius veins when visible. The superficial great saphenous vein was also interrogated. Spectral Doppler was utilized to evaluate flow at rest and with distal augmentation maneuvers in the common femoral, femoral and popliteal veins. COMPARISON:  None FINDINGS: Contralateral Common Femoral Vein: Respiratory phasicity is normal and symmetric with the symptomatic side. No evidence of thrombus. Normal compressibility. Common Femoral Vein: No evidence of thrombus. Normal compressibility, respiratory phasicity and response to augmentation. Saphenofemoral Junction: No evidence of thrombus. Normal compressibility and flow on color Doppler imaging. Profunda Femoral Vein: Hypoechoic nonocclusive thrombus. Impaired compressibility and flow on color Doppler imaging. Femoral Vein: Hypoechoic nonocclusive thrombus.  Impaired compressibility and respiratory phasicity. Impaired spontaneous venous flow. Popliteal Vein: Hypoechoic nonocclusive thrombus. Impaired compressibility and respiratory phasicity. Impaired spontaneous venous flow. Calf Veins: Hypoechoic nonocclusive thrombus. Impaired compressibility. Impaired spontaneous venous flow. Superficial Great Saphenous Vein: No evidence of thrombus. Normal compressibility. Venous Reflux:  None. Other Findings:  None. IMPRESSION: Nonocclusive deep venous thrombosis in LEFT lower extremity extending from calf veins to the proximal femoral and profunda femoral veins. These results will be called to the ordering clinician or representative by the Radiologist Assistant, and  communication documented in the PACS or zVision Dashboard. Electronically Signed   By: Lavonia Dana M.D.   On: 02/27/2018 13:32   Mm 3d Screen Breast Bilateral  Result Date: 02/27/2018 CLINICAL DATA:  Screening. EXAM: DIGITAL SCREENING BILATERAL MAMMOGRAM WITH TOMO AND CAD COMPARISON:  Previous exam(s). ACR Breast Density Category b: There are scattered areas of fibroglandular density. FINDINGS: There are no findings suspicious for malignancy. Images were processed with CAD. IMPRESSION: No mammographic evidence of malignancy. A result letter of this screening mammogram will be mailed directly to the patient. RECOMMENDATION: Screening mammogram in one year. (Code:SM-B-01Y) BI-RADS CATEGORY  1: Negative. Electronically Signed   By: Lajean Manes M.D.   On: 02/27/2018 11:44     Assessment/Plan 1. Acute deep vein thrombosis (DVT) of femoral vein of left lower extremity (HCC) Recommend:   No surgery or intervention at this point in time.  IVC filter is not indicated at present.  Patient's duplex ultrasound of the venous system shows DVT from the popliteal to the femoral veins.  The patient is initiated on anticoagulation   Elevation was stressed, use of a recliner was discussed.  I have had a long  discussion with the patient regarding DVT and post phlebitic changes such as swelling and why it  causes symptoms such as pain.  The patient will wear graduated compression stockings (15-20 mmHg), beginning after three full days of anticoagulation, on a daily basis a prescription was given. The patient will  beginning wearing the stockings first thing in the morning and removing them in the evening. The patient is instructed specifically not to sleep in the stockings.  In addition, behavioral modification including elevation during the day and avoidance of prolonged dependency will be initiated.    The patient will continue anticoagulation for now as there have not been any problems or complications at this point.   - VAS [place] LOWER EXTREMITY VENOUS (DVT); Future  2. Closed fracture of femur, intertrochanteric, left, initial encounter ([place]) OK for therapy to resume no restrictions per vascular  3. Essential hypertension, benign Continue antihypertensive medications as already ordered, these medications have been reviewed and there are no changes at this time.   4. Mixed hyperlipidemia Continue statin as ordered and reviewed, no changes at this time     Hortencia Pilar, MD  03/09/2018 1:18 PM

## 2018-03-13 ENCOUNTER — Encounter (INDEPENDENT_AMBULATORY_CARE_PROVIDER_SITE_OTHER): Payer: Self-pay | Admitting: Orthopaedic Surgery

## 2018-03-13 ENCOUNTER — Ambulatory Visit (INDEPENDENT_AMBULATORY_CARE_PROVIDER_SITE_OTHER): Payer: Medicare Other

## 2018-03-13 ENCOUNTER — Ambulatory Visit (INDEPENDENT_AMBULATORY_CARE_PROVIDER_SITE_OTHER): Payer: Medicare Other | Admitting: Orthopaedic Surgery

## 2018-03-13 DIAGNOSIS — S72145D Nondisplaced intertrochanteric fracture of left femur, subsequent encounter for closed fracture with routine healing: Secondary | ICD-10-CM | POA: Diagnosis not present

## 2018-03-13 NOTE — Progress Notes (Signed)
The patient is a 84 year old who is now 50 days status post open reduction and fixation of a left hip intertrochanteric fracture.  She has been out of therapy the last 2 weeks because she did develop a blood clot is now on Eliquis.  She reports minimal hip pain.  She is been getting around easily with a walker.  On exam she lets me easily put her left hip through flexion, extension, internal and external rotation without any difficulty at all and not much pain.  2 views of her left femur show intact hardware with interval healing of the fracture of the left hip.  This point should continue back with therapy and work on her balance coordination and gait training.  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  We will see her back for final visit in 6 weeks.  At that visit I want an AP and lateral of the left hip only.  This does not need to include the knee.  This also does not need to include the whole pelvis.  All questions concerns were answered and addressed.  She will continue to increase her activities as comfort allows.

## 2018-03-14 ENCOUNTER — Telehealth: Payer: Self-pay

## 2018-03-14 DIAGNOSIS — I6523 Occlusion and stenosis of bilateral carotid arteries: Secondary | ICD-10-CM | POA: Diagnosis not present

## 2018-03-14 DIAGNOSIS — D509 Iron deficiency anemia, unspecified: Secondary | ICD-10-CM | POA: Diagnosis not present

## 2018-03-14 DIAGNOSIS — H409 Unspecified glaucoma: Secondary | ICD-10-CM | POA: Diagnosis not present

## 2018-03-14 DIAGNOSIS — K219 Gastro-esophageal reflux disease without esophagitis: Secondary | ICD-10-CM | POA: Diagnosis not present

## 2018-03-14 DIAGNOSIS — K7469 Other cirrhosis of liver: Secondary | ICD-10-CM | POA: Diagnosis not present

## 2018-03-14 DIAGNOSIS — E785 Hyperlipidemia, unspecified: Secondary | ICD-10-CM | POA: Diagnosis not present

## 2018-03-14 DIAGNOSIS — S72012D Unspecified intracapsular fracture of left femur, subsequent encounter for closed fracture with routine healing: Secondary | ICD-10-CM | POA: Diagnosis not present

## 2018-03-14 DIAGNOSIS — I10 Essential (primary) hypertension: Secondary | ICD-10-CM | POA: Diagnosis not present

## 2018-03-14 DIAGNOSIS — E559 Vitamin D deficiency, unspecified: Secondary | ICD-10-CM | POA: Diagnosis not present

## 2018-03-14 DIAGNOSIS — M810 Age-related osteoporosis without current pathological fracture: Secondary | ICD-10-CM | POA: Diagnosis not present

## 2018-03-14 DIAGNOSIS — Z9181 History of falling: Secondary | ICD-10-CM | POA: Diagnosis not present

## 2018-03-14 DIAGNOSIS — H04129 Dry eye syndrome of unspecified lacrimal gland: Secondary | ICD-10-CM | POA: Diagnosis not present

## 2018-03-14 DIAGNOSIS — K5900 Constipation, unspecified: Secondary | ICD-10-CM | POA: Diagnosis not present

## 2018-03-14 DIAGNOSIS — E560 Deficiency of vitamin E: Secondary | ICD-10-CM | POA: Diagnosis not present

## 2018-03-14 NOTE — Telephone Encounter (Signed)
Verbal orders given to [facility] for PT 2 x wk for 3 wk, then 1 x wk for 1 wk.

## 2018-03-14 NOTE — Telephone Encounter (Signed)
Tillie Rung PT with [facility] request verbal orders for [facility] PT 2 x wk for 3 wk and 1 x a wk for 1 wk.

## 2018-03-16 DIAGNOSIS — E559 Vitamin D deficiency, unspecified: Secondary | ICD-10-CM | POA: Diagnosis not present

## 2018-03-16 DIAGNOSIS — D509 Iron deficiency anemia, unspecified: Secondary | ICD-10-CM | POA: Diagnosis not present

## 2018-03-16 DIAGNOSIS — I10 Essential (primary) hypertension: Secondary | ICD-10-CM | POA: Diagnosis not present

## 2018-03-16 DIAGNOSIS — Z9181 History of falling: Secondary | ICD-10-CM | POA: Diagnosis not present

## 2018-03-16 DIAGNOSIS — S72012D Unspecified intracapsular fracture of left femur, subsequent encounter for closed fracture with routine healing: Secondary | ICD-10-CM | POA: Diagnosis not present

## 2018-03-16 DIAGNOSIS — H409 Unspecified glaucoma: Secondary | ICD-10-CM | POA: Diagnosis not present

## 2018-03-16 DIAGNOSIS — H04129 Dry eye syndrome of unspecified lacrimal gland: Secondary | ICD-10-CM | POA: Diagnosis not present

## 2018-03-16 DIAGNOSIS — K219 Gastro-esophageal reflux disease without esophagitis: Secondary | ICD-10-CM | POA: Diagnosis not present

## 2018-03-16 DIAGNOSIS — M810 Age-related osteoporosis without current pathological fracture: Secondary | ICD-10-CM | POA: Diagnosis not present

## 2018-03-16 DIAGNOSIS — E560 Deficiency of vitamin E: Secondary | ICD-10-CM | POA: Diagnosis not present

## 2018-03-16 DIAGNOSIS — I6523 Occlusion and stenosis of bilateral carotid arteries: Secondary | ICD-10-CM | POA: Diagnosis not present

## 2018-03-16 DIAGNOSIS — E785 Hyperlipidemia, unspecified: Secondary | ICD-10-CM | POA: Diagnosis not present

## 2018-03-16 DIAGNOSIS — K7469 Other cirrhosis of liver: Secondary | ICD-10-CM | POA: Diagnosis not present

## 2018-03-16 DIAGNOSIS — K5900 Constipation, unspecified: Secondary | ICD-10-CM | POA: Diagnosis not present

## 2018-03-21 DIAGNOSIS — K219 Gastro-esophageal reflux disease without esophagitis: Secondary | ICD-10-CM | POA: Diagnosis not present

## 2018-03-21 DIAGNOSIS — Z9181 History of falling: Secondary | ICD-10-CM | POA: Diagnosis not present

## 2018-03-21 DIAGNOSIS — S72012D Unspecified intracapsular fracture of left femur, subsequent encounter for closed fracture with routine healing: Secondary | ICD-10-CM | POA: Diagnosis not present

## 2018-03-21 DIAGNOSIS — I6523 Occlusion and stenosis of bilateral carotid arteries: Secondary | ICD-10-CM | POA: Diagnosis not present

## 2018-03-21 DIAGNOSIS — E785 Hyperlipidemia, unspecified: Secondary | ICD-10-CM | POA: Diagnosis not present

## 2018-03-21 DIAGNOSIS — E560 Deficiency of vitamin E: Secondary | ICD-10-CM | POA: Diagnosis not present

## 2018-03-21 DIAGNOSIS — K5900 Constipation, unspecified: Secondary | ICD-10-CM | POA: Diagnosis not present

## 2018-03-21 DIAGNOSIS — I10 Essential (primary) hypertension: Secondary | ICD-10-CM | POA: Diagnosis not present

## 2018-03-21 DIAGNOSIS — H04129 Dry eye syndrome of unspecified lacrimal gland: Secondary | ICD-10-CM | POA: Diagnosis not present

## 2018-03-21 DIAGNOSIS — H409 Unspecified glaucoma: Secondary | ICD-10-CM | POA: Diagnosis not present

## 2018-03-21 DIAGNOSIS — E559 Vitamin D deficiency, unspecified: Secondary | ICD-10-CM | POA: Diagnosis not present

## 2018-03-21 DIAGNOSIS — K7469 Other cirrhosis of liver: Secondary | ICD-10-CM | POA: Diagnosis not present

## 2018-03-21 DIAGNOSIS — M810 Age-related osteoporosis without current pathological fracture: Secondary | ICD-10-CM | POA: Diagnosis not present

## 2018-03-21 DIAGNOSIS — D509 Iron deficiency anemia, unspecified: Secondary | ICD-10-CM | POA: Diagnosis not present

## 2018-03-23 DIAGNOSIS — K7469 Other cirrhosis of liver: Secondary | ICD-10-CM | POA: Diagnosis not present

## 2018-03-23 DIAGNOSIS — E785 Hyperlipidemia, unspecified: Secondary | ICD-10-CM | POA: Diagnosis not present

## 2018-03-23 DIAGNOSIS — M810 Age-related osteoporosis without current pathological fracture: Secondary | ICD-10-CM | POA: Diagnosis not present

## 2018-03-23 DIAGNOSIS — H409 Unspecified glaucoma: Secondary | ICD-10-CM | POA: Diagnosis not present

## 2018-03-23 DIAGNOSIS — E559 Vitamin D deficiency, unspecified: Secondary | ICD-10-CM | POA: Diagnosis not present

## 2018-03-23 DIAGNOSIS — I10 Essential (primary) hypertension: Secondary | ICD-10-CM | POA: Diagnosis not present

## 2018-03-23 DIAGNOSIS — I6523 Occlusion and stenosis of bilateral carotid arteries: Secondary | ICD-10-CM | POA: Diagnosis not present

## 2018-03-23 DIAGNOSIS — Z9181 History of falling: Secondary | ICD-10-CM | POA: Diagnosis not present

## 2018-03-23 DIAGNOSIS — D509 Iron deficiency anemia, unspecified: Secondary | ICD-10-CM | POA: Diagnosis not present

## 2018-03-23 DIAGNOSIS — H04129 Dry eye syndrome of unspecified lacrimal gland: Secondary | ICD-10-CM | POA: Diagnosis not present

## 2018-03-23 DIAGNOSIS — K5900 Constipation, unspecified: Secondary | ICD-10-CM | POA: Diagnosis not present

## 2018-03-23 DIAGNOSIS — S72012D Unspecified intracapsular fracture of left femur, subsequent encounter for closed fracture with routine healing: Secondary | ICD-10-CM | POA: Diagnosis not present

## 2018-03-23 DIAGNOSIS — K219 Gastro-esophageal reflux disease without esophagitis: Secondary | ICD-10-CM | POA: Diagnosis not present

## 2018-03-23 DIAGNOSIS — E560 Deficiency of vitamin E: Secondary | ICD-10-CM | POA: Diagnosis not present

## 2018-03-27 MED ORDER — DICLOFENAC SODIUM 1 % TD GEL: 4.0000 g | Freq: Four times a day (QID) | TRANSDERMAL | 11 refills | Status: DC

## 2018-03-30 DIAGNOSIS — E559 Vitamin D deficiency, unspecified: Secondary | ICD-10-CM | POA: Diagnosis not present

## 2018-03-30 DIAGNOSIS — H04129 Dry eye syndrome of unspecified lacrimal gland: Secondary | ICD-10-CM | POA: Diagnosis not present

## 2018-03-30 DIAGNOSIS — K219 Gastro-esophageal reflux disease without esophagitis: Secondary | ICD-10-CM | POA: Diagnosis not present

## 2018-03-30 DIAGNOSIS — Z9181 History of falling: Secondary | ICD-10-CM | POA: Diagnosis not present

## 2018-03-30 DIAGNOSIS — D1800 Hemangioma unspecified site: Secondary | ICD-10-CM | POA: Diagnosis not present

## 2018-03-30 DIAGNOSIS — E785 Hyperlipidemia, unspecified: Secondary | ICD-10-CM | POA: Diagnosis not present

## 2018-03-30 DIAGNOSIS — E560 Deficiency of vitamin E: Secondary | ICD-10-CM | POA: Diagnosis not present

## 2018-03-30 DIAGNOSIS — Z1283 Encounter for screening for malignant neoplasm of skin: Secondary | ICD-10-CM | POA: Diagnosis not present

## 2018-03-30 DIAGNOSIS — M810 Age-related osteoporosis without current pathological fracture: Secondary | ICD-10-CM | POA: Diagnosis not present

## 2018-03-30 DIAGNOSIS — K7469 Other cirrhosis of liver: Secondary | ICD-10-CM | POA: Diagnosis not present

## 2018-03-30 DIAGNOSIS — K5900 Constipation, unspecified: Secondary | ICD-10-CM | POA: Diagnosis not present

## 2018-03-30 DIAGNOSIS — L814 Other melanin hyperpigmentation: Secondary | ICD-10-CM | POA: Diagnosis not present

## 2018-03-30 DIAGNOSIS — D509 Iron deficiency anemia, unspecified: Secondary | ICD-10-CM | POA: Diagnosis not present

## 2018-03-30 DIAGNOSIS — Z85828 Personal history of other malignant neoplasm of skin: Secondary | ICD-10-CM | POA: Diagnosis not present

## 2018-03-30 DIAGNOSIS — I6523 Occlusion and stenosis of bilateral carotid arteries: Secondary | ICD-10-CM | POA: Diagnosis not present

## 2018-03-30 DIAGNOSIS — L821 Other seborrheic keratosis: Secondary | ICD-10-CM | POA: Diagnosis not present

## 2018-03-30 DIAGNOSIS — I10 Essential (primary) hypertension: Secondary | ICD-10-CM | POA: Diagnosis not present

## 2018-03-30 DIAGNOSIS — H409 Unspecified glaucoma: Secondary | ICD-10-CM | POA: Diagnosis not present

## 2018-03-30 DIAGNOSIS — L570 Actinic keratosis: Secondary | ICD-10-CM | POA: Diagnosis not present

## 2018-03-30 DIAGNOSIS — S72012D Unspecified intracapsular fracture of left femur, subsequent encounter for closed fracture with routine healing: Secondary | ICD-10-CM | POA: Diagnosis not present

## 2018-04-06 DIAGNOSIS — M810 Age-related osteoporosis without current pathological fracture: Secondary | ICD-10-CM | POA: Diagnosis not present

## 2018-04-06 DIAGNOSIS — S72012D Unspecified intracapsular fracture of left femur, subsequent encounter for closed fracture with routine healing: Secondary | ICD-10-CM | POA: Diagnosis not present

## 2018-04-06 DIAGNOSIS — Z9181 History of falling: Secondary | ICD-10-CM | POA: Diagnosis not present

## 2018-04-06 DIAGNOSIS — E785 Hyperlipidemia, unspecified: Secondary | ICD-10-CM | POA: Diagnosis not present

## 2018-04-06 DIAGNOSIS — K7469 Other cirrhosis of liver: Secondary | ICD-10-CM | POA: Diagnosis not present

## 2018-04-06 DIAGNOSIS — I10 Essential (primary) hypertension: Secondary | ICD-10-CM | POA: Diagnosis not present

## 2018-04-06 DIAGNOSIS — E559 Vitamin D deficiency, unspecified: Secondary | ICD-10-CM | POA: Diagnosis not present

## 2018-04-06 DIAGNOSIS — K219 Gastro-esophageal reflux disease without esophagitis: Secondary | ICD-10-CM | POA: Diagnosis not present

## 2018-04-06 DIAGNOSIS — E560 Deficiency of vitamin E: Secondary | ICD-10-CM | POA: Diagnosis not present

## 2018-04-06 DIAGNOSIS — D509 Iron deficiency anemia, unspecified: Secondary | ICD-10-CM | POA: Diagnosis not present

## 2018-04-06 DIAGNOSIS — H409 Unspecified glaucoma: Secondary | ICD-10-CM | POA: Diagnosis not present

## 2018-04-06 DIAGNOSIS — K5900 Constipation, unspecified: Secondary | ICD-10-CM | POA: Diagnosis not present

## 2018-04-06 DIAGNOSIS — I6523 Occlusion and stenosis of bilateral carotid arteries: Secondary | ICD-10-CM | POA: Diagnosis not present

## 2018-04-06 DIAGNOSIS — H04129 Dry eye syndrome of unspecified lacrimal gland: Secondary | ICD-10-CM | POA: Diagnosis not present

## 2018-04-13 DIAGNOSIS — I6523 Occlusion and stenosis of bilateral carotid arteries: Secondary | ICD-10-CM | POA: Diagnosis not present

## 2018-04-13 DIAGNOSIS — M810 Age-related osteoporosis without current pathological fracture: Secondary | ICD-10-CM | POA: Diagnosis not present

## 2018-04-13 DIAGNOSIS — E560 Deficiency of vitamin E: Secondary | ICD-10-CM | POA: Diagnosis not present

## 2018-04-13 DIAGNOSIS — H04129 Dry eye syndrome of unspecified lacrimal gland: Secondary | ICD-10-CM | POA: Diagnosis not present

## 2018-04-13 DIAGNOSIS — K5900 Constipation, unspecified: Secondary | ICD-10-CM | POA: Diagnosis not present

## 2018-04-13 DIAGNOSIS — S72012D Unspecified intracapsular fracture of left femur, subsequent encounter for closed fracture with routine healing: Secondary | ICD-10-CM | POA: Diagnosis not present

## 2018-04-13 DIAGNOSIS — Z9181 History of falling: Secondary | ICD-10-CM | POA: Diagnosis not present

## 2018-04-13 DIAGNOSIS — K219 Gastro-esophageal reflux disease without esophagitis: Secondary | ICD-10-CM | POA: Diagnosis not present

## 2018-04-13 DIAGNOSIS — E785 Hyperlipidemia, unspecified: Secondary | ICD-10-CM | POA: Diagnosis not present

## 2018-04-13 DIAGNOSIS — D509 Iron deficiency anemia, unspecified: Secondary | ICD-10-CM | POA: Diagnosis not present

## 2018-04-13 DIAGNOSIS — K7469 Other cirrhosis of liver: Secondary | ICD-10-CM | POA: Diagnosis not present

## 2018-04-13 DIAGNOSIS — H409 Unspecified glaucoma: Secondary | ICD-10-CM | POA: Diagnosis not present

## 2018-04-13 DIAGNOSIS — I10 Essential (primary) hypertension: Secondary | ICD-10-CM | POA: Diagnosis not present

## 2018-04-13 DIAGNOSIS — E559 Vitamin D deficiency, unspecified: Secondary | ICD-10-CM | POA: Diagnosis not present

## 2018-04-15 ENCOUNTER — Other Ambulatory Visit: Payer: Self-pay | Admitting: Family Medicine

## 2018-04-17 ENCOUNTER — Other Ambulatory Visit: Payer: Self-pay | Admitting: Family Medicine

## 2018-04-17 MED ORDER — TRAZODONE HCL 150 MG PO TABS: 150.0000 mg | ORAL_TABLET | Freq: Every day | ORAL | 1 refills | Status: DC

## 2018-04-17 NOTE — Telephone Encounter (Signed)
Ok to increase to 150 mg qhs?

## 2018-04-21 ENCOUNTER — Other Ambulatory Visit: Payer: Self-pay

## 2018-04-22 NOTE — Telephone Encounter (Signed)
Last office visit 11/04/2017 for multiple issues.  Last refilled 02/16/2018 for #30 with no refills. CPE scheduled for 06/2018.  Ok to refill?

## 2018-04-24 ENCOUNTER — Encounter (INDEPENDENT_AMBULATORY_CARE_PROVIDER_SITE_OTHER): Payer: Self-pay | Admitting: Orthopaedic Surgery

## 2018-04-24 ENCOUNTER — Ambulatory Visit (INDEPENDENT_AMBULATORY_CARE_PROVIDER_SITE_OTHER): Payer: Medicare Other

## 2018-04-24 ENCOUNTER — Ambulatory Visit (INDEPENDENT_AMBULATORY_CARE_PROVIDER_SITE_OTHER): Payer: Medicare Other | Admitting: Orthopaedic Surgery

## 2018-04-24 DIAGNOSIS — S72145D Nondisplaced intertrochanteric fracture of left femur, subsequent encounter for closed fracture with routine healing: Secondary | ICD-10-CM

## 2018-04-24 MED ORDER — TRAMADOL HCL 50 MG PO TABS: ORAL_TABLET | ORAL | 0 refills | Status: DC

## 2018-04-24 NOTE — Progress Notes (Signed)
The patient is an 85 year old female who is now 3 months status post fixation of a left intertrochanteric hip fracture.  She ambulates with a cane.  She has some minimal hip pain at this point and some knee pain.  She points to the lateral aspect of her left knee at the IT band is source of her pain  On exam I can easily put her left hip to internal extra rotation with no pain in the groin at all.  It moves smoothly.  She is a very thin individual and she does have pain over the trochanteric area where the hardware is inserted.  I do feel her some hardware prominence that may bother her chronically.  Otherwise though she seems to be doing well on her mobility.  She is very pleased.  X-rays of her left hip shows that the intertrochanteric fracture is healed and there is no evidence of complicating features of the hardware.  Her hip joint is well-maintained.  This point all question concerns were answered and addressed.  Follow-up can be as needed.  If she does have problems with bursitis over the IT band area and trochanteric area of her hip she can always come back for a steroid injection if needed.

## 2018-04-24 NOTE — Telephone Encounter (Signed)
Pharmacy Updated. 

## 2018-05-11 DIAGNOSIS — N819 Female genital prolapse, unspecified: Secondary | ICD-10-CM | POA: Diagnosis not present

## 2018-05-11 DIAGNOSIS — R3915 Urgency of urination: Secondary | ICD-10-CM | POA: Diagnosis not present

## 2018-05-15 ENCOUNTER — Inpatient Hospital Stay
Admission: EM | Admit: 2018-05-15 | Discharge: 2018-05-20 | DRG: 378 | Disposition: A | Discharge status: Home Health | Payer: Medicare Other | Attending: Internal Medicine | Admitting: Internal Medicine

## 2018-05-15 ENCOUNTER — Other Ambulatory Visit: Payer: Self-pay

## 2018-05-15 ENCOUNTER — Emergency Department: Payer: Medicare Other

## 2018-05-15 DIAGNOSIS — K31811 Angiodysplasia of stomach and duodenum with bleeding: Secondary | ICD-10-CM | POA: Diagnosis not present

## 2018-05-15 DIAGNOSIS — Z833 Family history of diabetes mellitus: Secondary | ICD-10-CM | POA: Diagnosis not present

## 2018-05-15 DIAGNOSIS — K219 Gastro-esophageal reflux disease without esophagitis: Secondary | ICD-10-CM | POA: Diagnosis not present

## 2018-05-15 DIAGNOSIS — Z974 Presence of external hearing-aid: Secondary | ICD-10-CM | POA: Diagnosis not present

## 2018-05-15 DIAGNOSIS — Z9049 Acquired absence of other specified parts of digestive tract: Secondary | ICD-10-CM | POA: Diagnosis not present

## 2018-05-15 DIAGNOSIS — Z8349 Family history of other endocrine, nutritional and metabolic diseases: Secondary | ICD-10-CM | POA: Diagnosis not present

## 2018-05-15 DIAGNOSIS — F5104 Psychophysiologic insomnia: Secondary | ICD-10-CM | POA: Diagnosis present

## 2018-05-15 DIAGNOSIS — M810 Age-related osteoporosis without current pathological fracture: Secondary | ICD-10-CM | POA: Diagnosis not present

## 2018-05-15 DIAGNOSIS — Z791 Long term (current) use of non-steroidal anti-inflammatories (NSAID): Secondary | ICD-10-CM

## 2018-05-15 DIAGNOSIS — K766 Portal hypertension: Secondary | ICD-10-CM | POA: Diagnosis not present

## 2018-05-15 DIAGNOSIS — I8500 Esophageal varices without bleeding: Secondary | ICD-10-CM | POA: Diagnosis present

## 2018-05-15 DIAGNOSIS — K922 Gastrointestinal hemorrhage, unspecified: Secondary | ICD-10-CM | POA: Diagnosis not present

## 2018-05-15 DIAGNOSIS — D509 Iron deficiency anemia, unspecified: Secondary | ICD-10-CM | POA: Diagnosis present

## 2018-05-15 DIAGNOSIS — I10 Essential (primary) hypertension: Secondary | ICD-10-CM | POA: Diagnosis present

## 2018-05-15 DIAGNOSIS — E785 Hyperlipidemia, unspecified: Secondary | ICD-10-CM | POA: Diagnosis not present

## 2018-05-15 DIAGNOSIS — K449 Diaphragmatic hernia without obstruction or gangrene: Secondary | ICD-10-CM | POA: Diagnosis present

## 2018-05-15 DIAGNOSIS — I82401 Acute embolism and thrombosis of unspecified deep veins of right lower extremity: Secondary | ICD-10-CM | POA: Diagnosis not present

## 2018-05-15 DIAGNOSIS — Z79891 Long term (current) use of opiate analgesic: Secondary | ICD-10-CM | POA: Diagnosis not present

## 2018-05-15 DIAGNOSIS — Z8619 Personal history of other infectious and parasitic diseases: Secondary | ICD-10-CM | POA: Diagnosis not present

## 2018-05-15 DIAGNOSIS — Z8711 Personal history of peptic ulcer disease: Secondary | ICD-10-CM

## 2018-05-15 DIAGNOSIS — Z7901 Long term (current) use of anticoagulants: Secondary | ICD-10-CM | POA: Diagnosis not present

## 2018-05-15 DIAGNOSIS — K7469 Other cirrhosis of liver: Secondary | ICD-10-CM | POA: Diagnosis present

## 2018-05-15 DIAGNOSIS — K7460 Unspecified cirrhosis of liver: Secondary | ICD-10-CM | POA: Diagnosis not present

## 2018-05-15 DIAGNOSIS — Z66 Do not resuscitate: Secondary | ICD-10-CM | POA: Diagnosis present

## 2018-05-15 DIAGNOSIS — F419 Anxiety disorder, unspecified: Secondary | ICD-10-CM | POA: Diagnosis present

## 2018-05-15 DIAGNOSIS — I864 Gastric varices: Secondary | ICD-10-CM | POA: Diagnosis present

## 2018-05-15 DIAGNOSIS — K5730 Diverticulosis of large intestine without perforation or abscess without bleeding: Secondary | ICD-10-CM | POA: Diagnosis not present

## 2018-05-15 DIAGNOSIS — Z7983 Long term (current) use of bisphosphonates: Secondary | ICD-10-CM | POA: Diagnosis not present

## 2018-05-15 DIAGNOSIS — I82531 Chronic embolism and thrombosis of right popliteal vein: Secondary | ICD-10-CM | POA: Diagnosis not present

## 2018-05-15 DIAGNOSIS — K920 Hematemesis: Secondary | ICD-10-CM | POA: Diagnosis not present

## 2018-05-15 DIAGNOSIS — I82431 Acute embolism and thrombosis of right popliteal vein: Secondary | ICD-10-CM | POA: Diagnosis not present

## 2018-05-15 DIAGNOSIS — H9190 Unspecified hearing loss, unspecified ear: Secondary | ICD-10-CM | POA: Diagnosis present

## 2018-05-15 DIAGNOSIS — Z8249 Family history of ischemic heart disease and other diseases of the circulatory system: Secondary | ICD-10-CM

## 2018-05-15 DIAGNOSIS — K31819 Angiodysplasia of stomach and duodenum without bleeding: Secondary | ICD-10-CM | POA: Diagnosis not present

## 2018-05-15 DIAGNOSIS — Z86718 Personal history of other venous thrombosis and embolism: Secondary | ICD-10-CM

## 2018-05-15 DIAGNOSIS — I82409 Acute embolism and thrombosis of unspecified deep veins of unspecified lower extremity: Secondary | ICD-10-CM

## 2018-05-15 DIAGNOSIS — Z79899 Other long term (current) drug therapy: Secondary | ICD-10-CM

## 2018-05-15 LAB — COMPREHENSIVE METABOLIC PANEL
ALK PHOS: 88 U/L (ref 38–126)
ALT: 24 U/L (ref 0–44)
ANION GAP: 7 (ref 5–15)
AST: 39 U/L (ref 15–41)
Albumin: 4 g/dL (ref 3.5–5.0)
BUN: 12 mg/dL (ref 8–23)
CO2: 26 mmol/L (ref 22–32)
Calcium: 8.3 mg/dL — ABNORMAL LOW (ref 8.9–10.3)
Chloride: 107 mmol/L (ref 98–111)
Creatinine, Ser: 0.52 mg/dL (ref 0.44–1.00)
GFR calc Af Amer: >60 mL/min (ref >60)
GFR calc non Af Amer: >60 mL/min (ref >60)
GLUCOSE: 98 mg/dL (ref 70–99)
Potassium: 3.5 mmol/L (ref 3.5–5.1)
Sodium: 140 mmol/L (ref 135–145)
Total Bilirubin: 0.8 mg/dL (ref 0.3–1.2)
Total Protein: 6.3 g/dL — ABNORMAL LOW (ref 6.5–8.1)

## 2018-05-15 LAB — APTT: aPTT: 31 seconds (ref 24–36)

## 2018-05-15 LAB — CBC
HCT: 35.4 % — ABNORMAL LOW (ref 36.0–46.0)
Hemoglobin: 11.6 g/dL — ABNORMAL LOW (ref 12.0–15.0)
MCH: 29.6 pg (ref 26.0–34.0)
MCHC: 32.8 g/dL (ref 30.0–36.0)
MCV: 90.3 fL (ref 80.0–100.0)
Platelets: 169 10*3/uL (ref 150–400)
RBC: 3.92 MIL/uL (ref 3.87–5.11)
RDW: 14.3 % (ref 11.5–15.5)
WBC: 5.5 10*3/uL (ref 4.0–10.5)
nRBC: 0 % (ref 0.0–0.2)

## 2018-05-15 LAB — TYPE AND SCREEN
ABO/RH(D): A POS
Antibody Screen: NEGATIVE

## 2018-05-15 LAB — PROTIME-INR
INR: 1.1 (ref 0.8–1.2)
Prothrombin Time: 13.7 seconds (ref 11.4–15.2)

## 2018-05-15 MED ORDER — IOHEXOL 300 MG/ML  SOLN
75.0000 mL | Freq: Once | INTRAMUSCULAR | Status: AC | PRN
  Administered 2018-05-15: 75 mL via INTRAVENOUS

## 2018-05-15 MED ORDER — ONDANSETRON 4 MG PO TBDP
4.0000 mg | ORAL_TABLET | Freq: Once | ORAL | Status: AC
  Administered 2018-05-15: 4 mg via ORAL
  Filled 2018-05-15: qty 1

## 2018-05-15 MED ORDER — SODIUM CHLORIDE 0.9 % IV BOLUS
500.0000 mL | Freq: Once | INTRAVENOUS | Status: AC
  Administered 2018-05-15: 500 mL via INTRAVENOUS

## 2018-05-15 NOTE — ED Provider Notes (Signed)
[facility] Emergency Department Provider Note ______________________________   First MD Initiated Contact with Patient 05/15/18 2245     (approximate)  I have reviewed the triage vital signs and the nursing notes.   HISTORY  Chief Complaint Hematemesis    HPI Brianna Barnes is a 85 y.o. female with below list of chronic medical conditions presents emergency department acute onset of bloody emesis occurred tonight at 8:00 PM.  Patient is currently taking Xarelto secondary to lower extremity DVT.  Patient admits to abdominal discomfort however denies any chest pain no shortness of breath or dizziness.  Patient states that nausea has improved since receiving Zofran on arrival to the emergency department.       Past Medical History:  Diagnosis Date  . Choledocholithiasis   . Cirrhosis, cryptogenic (HCC)    FOLLOWED BY DR PYRTLE-- LOV  IN EPIC--  STABLE PER NOTE  . Cryptogenic cirrhosis ([place])   . Esophageal varix ([place])   . GERD (gastroesophageal reflux disease)   . History of colonoscopy with polypectomy    ADENOMATOUS AND HYPERPLASIA POLYPS  (2008  &  2010)  . History of hepatitis C   . History of positive PPD    + TB SKIN TEST WITHOUT TB  . Hyperlipidemia   . Hypertension, portal ([place])    SECONDARY TO CRYPTOGENIC CIRRHOSIS  . Internal hemorrhoids   . Iron deficiency anemia   . Loss of hearing    right ear  . Osteoarthritis    hands, knees, and low back  . Osteoporosis   . PUD (peptic ulcer disease)   . Tubular adenoma of colon   . Uterovaginal prolapse, incomplete   . Wears glasses   . Wears hearing aid    left ear only    Patient Active Problem List   Diagnosis Date Noted  . GI bleed 05/16/2018  . DVT (deep venous thrombosis) ([place]) 03/09/2018  . Closed fracture of femur, intertrochanteric, left, initial encounter ([place]) 01/21/2018  . Dizzinesses 01/21/2018  . Carotid artery calcification, bilateral 01/21/2018  . S/p left hip  fracture 01/21/2018  . Bacteriuria, asymptomatic 11/04/2017  . Acute neck pain 11/04/2017  . Fatigue 11/04/2017  . Situational anxiety 11/04/2017  . Right foot pain 03/22/2017  . Right ankle pain 02/25/2017  . Chronic insomnia 04/03/2016  . Mild malnutrition ([place]) 01/30/2016  . BPPV (benign paroxysmal positional vertigo) 01/30/2016  . Counseling regarding end of life decision making 01/25/2014  . Choledocholithiasis 11/30/2012  . Cryptogenic cirrhosis ([place]) 11/22/2012  . Family history of early CAD 10/29/2010  . Vitamin D deficiency 06/24/2009  . Essential hypertension, benign 06/13/2009  . Hyperlipidemia 06/06/2009  . Iron deficiency anemia 09/17/2008  . GERD 09/17/2008  . PEPTIC ULCER DISEASE 09/17/2008  . UTEROVAGINAL PROLAPSE, INCOMPLETE 09/17/2008  . OSTEOARTHRITIS 09/17/2008  . Osteoporosis 09/17/2008  . ARTERIOVENOUS MALFORMATION 09/17/2008  . POSITIVE TB SKIN TEST, WITHOUT TUBERCULOSIS 09/17/2008  . COLONIC POLYPS, BENIGN, HX OF 09/17/2008    Past Surgical History:  Procedure Laterality Date  . APPENDECTOMY  1960'S  . CHOLECYSTECTOMY  08/25/2011   Procedure: LAPAROSCOPIC CHOLECYSTECTOMY WITH INTRAOPERATIVE CHOLANGIOGRAM;  Surgeon: Zenovia Jarred, MD;  Location: [facility];  Service: General;  Laterality: N/A;  . ENDOSCOPIC RETROGRADE CHOLANGIOPANCREATOGRAPHY (ERCP) WITH PROPOFOL N/A 11/30/2012   Procedure: ENDOSCOPIC RETROGRADE CHOLANGIOPANCREATOGRAPHY (ERCP) WITH PROPOFOL;  Surgeon: Milus Banister, MD;  Location: [facility];  Service: Endoscopy;  Laterality: N/A;  . EXTERNAL EAR SURGERY Left 1980  . HYSTEROSCOPY W/D&C N/A 07/15/2014  Procedure: DILATATION AND CURETTAGE /HYSTEROSCOPY;  Surgeon: Molli Posey, MD;  Location: [facility];  Service: Gynecology;  Laterality: N/A;  . INTRAMEDULLARY (IM) NAIL INTERTROCHANTERIC Left 01/22/2018   Procedure: ORIF LEFT INTERTROCHANTRIC HIP FX;  Surgeon: Mcarthur Rossetti, MD;  Location: [facility];  Service:  Orthopedics;  Laterality: Left;  . KNEE ARTHROSCOPY W/ MENISCECTOMY Right 06-19-2002  . [place] SURGERY  1990  . REMOVAL OF URINARY SLING N/A 09/13/2013   Procedure: REMOVAL OF RETAINED PESSARY;  Surgeon: Jorja Loa, MD;  Location: [facility];  Service: Urology;  Laterality: N/A;    Prior to Admission medications   Medication Sig Start Date End Date Taking? Authorizing Provider  alendronate (FOSAMAX) 70 MG tablet TAKE 1 TABLET BY MOUTH ONCE A WEEK, AS DIRECTED 04/17/18  Yes Bedsole, [name] E, MD  apixaban (ELIQUIS) 5 MG TABS tablet Take 1 tablet (5 mg total) by mouth 2 (two) times daily. 03/09/18  Yes Schnier, Dolores Lory, MD  Calcium-Phosphorus-Vitamin D (CALCIUM GUMMIES PO) Take 1 tablet by mouth daily.   Yes [provider]  diclofenac sodium (VOLTAREN) 1 % GEL Apply 4 g topically 4 (four) times daily. 03/27/18  Yes Bedsole, [name] E, MD  dimenhyDRINATE (DRAMAMINE) 50 MG tablet Take 50 mg by mouth every 8 (eight) hours as needed for dizziness.   Yes [provider]  ibuprofen (ADVIL,MOTRIN) 200 MG tablet Take 400 mg by mouth daily as needed for headache or mild pain.    Yes [provider]  Iron Combinations (IRON COMPLEX PO) Take 1 tablet by mouth daily.   Yes [provider]  lidocaine (LIDODERM) 5 % Place 1 patch onto the skin daily. Remove & Discard patch within 12 hours or as directed by MD 11/04/17  Yes Bedsole, [name] E, MD  pantoprazole (PROTONIX) 40 MG tablet Take 1 tablet (40 mg total) by mouth daily before breakfast. 02/15/18  Yes Bedsole, [name] E, MD  sennosides-docusate sodium (SENOKOT-S) 8.6-50 MG tablet Take 1 tablet by mouth daily as needed for constipation.   Yes [provider]  timolol (TIMOPTIC) 0.5 % ophthalmic solution Place 1 drop into both eyes daily.  01/22/15  Yes [provider]  traMADol (ULTRAM) 50 MG tablet TAKE ONE-HALF TO ONE TABLET BY MOUTH UP TO EVERY 12 HOURS AS NEEDED FOR MODERATE PAIN. 04/24/18   Yes Copland, Frederico Hamman, MD  traZODone (DESYREL) 150 MG tablet Take 1 tablet (150 mg total) by mouth at bedtime. 04/17/18  Yes Bedsole, [name] E, MD  vitamin E 400 UNIT capsule Take 400 Units by mouth daily.   Yes [provider]  simethicone (MYLICON) 740 MG chewable tablet Chew 125 mg by mouth every 6 (six) hours as needed for flatulence.    [provider]    Allergies Patient has no known allergies.  Family History  Problem Relation Age of Onset  . Heart disease Mother   . Heart disease Father 38  . Hyperlipidemia Father   . Diabetes Father   . Cirrhosis Sister   . Diabetes Brother   . Heart disease Brother     Social History Social History   Tobacco Use  . Smoking status: Never Smoker  . Smokeless tobacco: Never Used  Substance Use Topics  . Alcohol use: No  . Drug use: No    Review of Systems Constitutional: No fever/chills Eyes: No visual changes. ENT: No sore throat. Cardiovascular: Denies chest pain. Respiratory: Denies shortness of breath. Gastrointestinal: Positive for abdominal pain and  vomiting. Genitourinary: Negative for dysuria. Musculoskeletal: Negative for neck pain.  Negative for back pain. Integumentary: Negative for rash. Neurological: Negative for headaches, focal weakness or numbness.  ____________________________________________   PHYSICAL EXAM:  VITAL SIGNS: ED Triage Vitals  Enc Vitals Group     BP 05/15/18 2111 (!) 173/79     Pulse Rate 05/15/18 2111 97     Resp 05/15/18 2111 18     Temp 05/15/18 2111 98.8 F (37.1 C)     Temp Source 05/15/18 2111 Oral     SpO2 05/15/18 2111 98 %     Weight 05/15/18 2112 48.5 kg (107 lb)     Height 05/15/18 2112 1.473 m (4\' 10" )     Head Circumference --      Peak Flow --      Pain Score 05/15/18 2112 0     Pain Loc --      Pain Edu? --      Excl. in [place]? --     Constitutional: Alert and oriented. Well appearing and in no acute distress. Eyes: Conjunctivae are pale. Head:  Atraumatic. Nose: No congestion/rhinnorhea. Mouth/Throat: Mucous membranes are moist.  Oropharynx non-erythematous. Neck: No stridor.   Cardiovascular: Normal rate, regular rhythm. Good peripheral circulation. Grossly normal heart sounds. Respiratory: Normal respiratory effort.  No retractions. Lungs CTAB. Gastrointestinal: Soft and nontender. No distention.  Guaiac negative Musculoskeletal: No lower extremity tenderness nor edema. No gross deformities of extremities. Neurologic:  Normal speech and language. No gross focal neurologic deficits are appreciated.  Skin:  Skin is warm, dry and intact. No rash noted. Psychiatric: Mood and affect are normal. Speech and behavior are normal.  ____________________________________________   LABS (all labs ordered are listed, but only abnormal results are displayed)  Labs Reviewed  COMPREHENSIVE METABOLIC PANEL - Abnormal; Notable for the following components:      Result Value   Calcium 8.3 (*)    Total Protein 6.3 (*)    All other components within normal limits  CBC - Abnormal; Notable for the following components:   Hemoglobin 11.6 (*)    HCT 35.4 (*)    All other components within normal limits  CBC - Abnormal; Notable for the following components:   RBC 3.61 (*)    Hemoglobin 10.6 (*)    HCT 32.6 (*)    Platelets 144 (*)    All other components within normal limits  PROTIME-INR  APTT  HEMOGLOBIN  HEMATOCRIT  COMPREHENSIVE METABOLIC PANEL  PROTIME-INR  APTT  POC OCCULT BLOOD, ED  TYPE AND SCREEN   ________________________________________  RADIOLOGY I, Alderson N Tashena Ibach, personally viewed and evaluated these images (plain radiographs) as part of my medical decision making, as well as reviewing the written report by the radiologist.  ED MD interpretation: Morphologic changes consistent with cirrhosis with a paraesophageal varices noted on CT abdomen pelvis with contrast  Official radiology report(s): Ct Abdomen Pelvis W  Contrast  Result Date: 05/15/2018 CLINICAL DATA:  One episode of bloody emesis. Patient is on blood thinners. EXAM: CT ABDOMEN AND PELVIS WITH CONTRAST TECHNIQUE: Multidetector CT imaging of the abdomen and pelvis was performed using the standard protocol following bolus administration of intravenous contrast. CONTRAST:  64mL OMNIPAQUE IOHEXOL 300 MG/ML  SOLN COMPARISON:  MRI of the abdomen 06/24/2014 FINDINGS: Lower chest: Mild cardiomegaly without pericardial effusion. Coronary arteriosclerosis is noted. [place] off a geode varices in small hiatal hernia is identified. Hepatobiliary: Morphologic changes of cirrhosis with enlarged left hepatic lobe, recanalized umbilical vein but  without intrahepatic mass or biliary dilatation. Cysts are noted of the right hepatic lobe. The patient is status post cholecystectomy. Pneumobilia likely from prior intervention is noted. Pancreas: No ductal dilatation, mass or inflammation. Spleen: The spleen measures 10.9 x 3.6 x 9.6 cm (volume = 200 cm^3). No splenic mass. Adrenals/Urinary Tract: Normal bilateral adrenal glands. No renal mass nor obstructive uropathy. No hydroureteronephrosis. The urinary bladder is unremarkable. Stomach/Bowel: Moderate-sized hiatal hernia. Physiologic distention of the stomach. The duodenal sweep and ligament of Treitz are normal. Scattered mild fluid distention of small bowel loops without mechanical obstruction. Moderate stool retention within the colon without inflammation. Scattered colonic diverticulosis without acute diverticulitis is identified. Normal-appearing appendix. Vascular/Lymphatic: Aortoiliac and branch vessel atherosclerosis. No aneurysm. Extensive paraesophageal and gastric varices. Recanalized umbilical vein. Reproductive: Hysterectomy. No adnexal mass. Cerclage device in place. Other: No free air nor ascites. Musculoskeletal: Left femoral nail fixation. Degenerative disc disease L4-5. IMPRESSION: 1. Morphologic changes of cirrhosis  with stigmata of portal hypertension including paraesophageal and gastric varices and recannulized umbilical vein. No evidence of active hemorrhage. 2. Status post cholecystectomy. Pneumobilia likely from prior intervention. Stable right-sided hepatic cysts. 3. Aortoiliac and branch vessel atherosclerosis. 4. Colonic diverticulosis without acute diverticulitis. 5. Degenerative disc disease L4-5. Left femoral nail fixation. Electronically Signed   By: Ashley Royalty M.D.   On: 05/15/2018 23:34     Procedures   ____________________________________________   INITIAL IMPRESSION / MDM / [place] / ED COURSE  As part of my medical decision making, I reviewed the following data within the electronic MEDICAL RECORD NUMBER  85 year old female present with above-stated history and physical exam secondary to upper GI bleed.  Concern for variceal bleed given known history of cirrhosis with visible paraesophageal varices on CT scan.  Patient given octreotide in the emergency department as well as Protonix.  Patient hemodynamically stable.  Patient discussed with Dr. Posey Pronto for hospital admission further evaluation and management. ____________________________________________  FINAL CLINICAL IMPRESSION(S) / ED DIAGNOSES  Final diagnoses:  Gastrointestinal hemorrhage, unspecified gastrointestinal hemorrhage type     MEDICATIONS GIVEN DURING THIS VISIT:  Medications  ondansetron (ZOFRAN) tablet 4 mg (has no administration in time range)    Or  ondansetron (ZOFRAN) injection 4 mg (has no administration in time range)  sodium chloride flush (NS) 0.9 % injection 3 mL (3 mLs Intravenous Not Given 05/16/18 0400)  lactated ringers infusion ( Intravenous New Bag/Given 05/16/18 0358)  pantoprazole (PROTONIX) 80 mg in sodium chloride 0.9 % 250 mL (0.32 mg/mL) infusion (has no administration in time range)  octreotide (SANDOSTATIN) 500 mcg in sodium chloride 0.9 % 250 mL (2 mcg/mL) infusion (50 mcg/hr  Intravenous New Bag/Given 05/16/18 0400)  ondansetron (ZOFRAN-ODT) disintegrating tablet 4 mg (4 mg Oral Given 05/15/18 2119)  sodium chloride 0.9 % bolus 500 mL (0 mLs Intravenous Stopped 05/16/18 0310)  iohexol (OMNIPAQUE) 300 MG/ML solution 75 mL (75 mLs Intravenous Contrast Given 05/15/18 2310)  cefTRIAXone (ROCEPHIN) 1 g in sodium chloride 0.9 % 100 mL IVPB (0 g Intravenous Stopped 05/16/18 0310)     ED Discharge Orders    None       Note:  This document was prepared using Dragon voice recognition software and may include unintentional dictation errors.   Gregor Hams, MD 05/16/18 [phone]

## 2018-05-15 NOTE — ED Triage Notes (Signed)
Pt to the er for an episode of bloody emesis. Pt is on blood thinners. Pt reports only only episode. Denies dizziness. Pt reports some nausea today and has been able to eat.

## 2018-05-16 ENCOUNTER — Encounter: Payer: Self-pay | Admitting: Emergency Medicine

## 2018-05-16 DIAGNOSIS — Z8619 Personal history of other infectious and parasitic diseases: Secondary | ICD-10-CM | POA: Diagnosis not present

## 2018-05-16 DIAGNOSIS — Z8711 Personal history of peptic ulcer disease: Secondary | ICD-10-CM | POA: Diagnosis not present

## 2018-05-16 DIAGNOSIS — K31811 Angiodysplasia of stomach and duodenum with bleeding: Secondary | ICD-10-CM | POA: Diagnosis present

## 2018-05-16 DIAGNOSIS — Z79891 Long term (current) use of opiate analgesic: Secondary | ICD-10-CM | POA: Diagnosis not present

## 2018-05-16 DIAGNOSIS — Z86718 Personal history of other venous thrombosis and embolism: Secondary | ICD-10-CM | POA: Diagnosis not present

## 2018-05-16 DIAGNOSIS — I864 Gastric varices: Secondary | ICD-10-CM | POA: Diagnosis present

## 2018-05-16 DIAGNOSIS — Z833 Family history of diabetes mellitus: Secondary | ICD-10-CM | POA: Diagnosis not present

## 2018-05-16 DIAGNOSIS — K922 Gastrointestinal hemorrhage, unspecified: Secondary | ICD-10-CM | POA: Diagnosis present

## 2018-05-16 DIAGNOSIS — Z9049 Acquired absence of other specified parts of digestive tract: Secondary | ICD-10-CM | POA: Diagnosis not present

## 2018-05-16 DIAGNOSIS — Z66 Do not resuscitate: Secondary | ICD-10-CM | POA: Diagnosis present

## 2018-05-16 DIAGNOSIS — Z7901 Long term (current) use of anticoagulants: Secondary | ICD-10-CM | POA: Diagnosis not present

## 2018-05-16 DIAGNOSIS — Z8349 Family history of other endocrine, nutritional and metabolic diseases: Secondary | ICD-10-CM | POA: Diagnosis not present

## 2018-05-16 DIAGNOSIS — M810 Age-related osteoporosis without current pathological fracture: Secondary | ICD-10-CM | POA: Diagnosis present

## 2018-05-16 DIAGNOSIS — Z79899 Other long term (current) drug therapy: Secondary | ICD-10-CM | POA: Diagnosis not present

## 2018-05-16 DIAGNOSIS — K766 Portal hypertension: Secondary | ICD-10-CM | POA: Diagnosis present

## 2018-05-16 DIAGNOSIS — K219 Gastro-esophageal reflux disease without esophagitis: Secondary | ICD-10-CM | POA: Diagnosis present

## 2018-05-16 DIAGNOSIS — H9190 Unspecified hearing loss, unspecified ear: Secondary | ICD-10-CM | POA: Diagnosis present

## 2018-05-16 DIAGNOSIS — I8500 Esophageal varices without bleeding: Secondary | ICD-10-CM | POA: Diagnosis present

## 2018-05-16 DIAGNOSIS — Z791 Long term (current) use of non-steroidal anti-inflammatories (NSAID): Secondary | ICD-10-CM | POA: Diagnosis not present

## 2018-05-16 DIAGNOSIS — Z974 Presence of external hearing-aid: Secondary | ICD-10-CM | POA: Diagnosis not present

## 2018-05-16 DIAGNOSIS — Z8249 Family history of ischemic heart disease and other diseases of the circulatory system: Secondary | ICD-10-CM | POA: Diagnosis not present

## 2018-05-16 DIAGNOSIS — K920 Hematemesis: Secondary | ICD-10-CM

## 2018-05-16 DIAGNOSIS — Z7983 Long term (current) use of bisphosphonates: Secondary | ICD-10-CM | POA: Diagnosis not present

## 2018-05-16 DIAGNOSIS — D509 Iron deficiency anemia, unspecified: Secondary | ICD-10-CM | POA: Diagnosis present

## 2018-05-16 DIAGNOSIS — K31819 Angiodysplasia of stomach and duodenum without bleeding: Secondary | ICD-10-CM | POA: Diagnosis not present

## 2018-05-16 DIAGNOSIS — K7469 Other cirrhosis of liver: Secondary | ICD-10-CM | POA: Diagnosis not present

## 2018-05-16 DIAGNOSIS — I82531 Chronic embolism and thrombosis of right popliteal vein: Secondary | ICD-10-CM | POA: Diagnosis present

## 2018-05-16 LAB — COMPREHENSIVE METABOLIC PANEL
ALK PHOS: 75 U/L (ref 38–126)
ALT: 20 U/L (ref 0–44)
AST: 33 U/L (ref 15–41)
Albumin: 3.3 g/dL — ABNORMAL LOW (ref 3.5–5.0)
Anion gap: 8 (ref 5–15)
BUN: 10 mg/dL (ref 8–23)
CALCIUM: 7.6 mg/dL — AB (ref 8.9–10.3)
CO2: 23 mmol/L (ref 22–32)
Chloride: 109 mmol/L (ref 98–111)
Creatinine, Ser: 0.39 mg/dL — ABNORMAL LOW (ref 0.44–1.00)
GFR calc Af Amer: >60 mL/min (ref >60)
GFR calc non Af Amer: >60 mL/min (ref >60)
Glucose, Bld: 97 mg/dL (ref 70–99)
Potassium: 3.8 mmol/L (ref 3.5–5.1)
Sodium: 140 mmol/L (ref 135–145)
Total Bilirubin: 0.3 mg/dL (ref 0.3–1.2)
Total Protein: 5.5 g/dL — ABNORMAL LOW (ref 6.5–8.1)

## 2018-05-16 LAB — CBC
HCT: 32.6 % — ABNORMAL LOW (ref 36.0–46.0)
Hemoglobin: 10.6 g/dL — ABNORMAL LOW (ref 12.0–15.0)
MCH: 29.4 pg (ref 26.0–34.0)
MCHC: 32.5 g/dL (ref 30.0–36.0)
MCV: 90.3 fL (ref 80.0–100.0)
Platelets: 144 10*3/uL — ABNORMAL LOW (ref 150–400)
RBC: 3.61 MIL/uL — ABNORMAL LOW (ref 3.87–5.11)
RDW: 14.2 % (ref 11.5–15.5)
WBC: 4.4 10*3/uL (ref 4.0–10.5)
nRBC: 0 % (ref 0.0–0.2)

## 2018-05-16 LAB — GLUCOSE, CAPILLARY: Glucose-Capillary: 103 mg/dL — ABNORMAL HIGH (ref 70–99)

## 2018-05-16 LAB — HEMOGLOBIN: Hemoglobin: 11 g/dL — ABNORMAL LOW (ref 12.0–15.0)

## 2018-05-16 LAB — PROTIME-INR
INR: 1.1 (ref 0.8–1.2)
PROTHROMBIN TIME: 13.6 s (ref 11.4–15.2)

## 2018-05-16 LAB — HEMATOCRIT: HCT: 34.3 % — ABNORMAL LOW (ref 36.0–46.0)

## 2018-05-16 LAB — APTT: aPTT: 24 seconds (ref 24–36)

## 2018-05-16 MED ORDER — SODIUM CHLORIDE 0.9% FLUSH
3.0000 mL | Freq: Two times a day (BID) | INTRAVENOUS | Status: DC
  Administered 2018-05-17 – 2018-05-20 (×7): 3 mL via INTRAVENOUS

## 2018-05-16 MED ORDER — SODIUM CHLORIDE 0.9 % IV SOLN
50.0000 ug/h | INTRAVENOUS | Status: DC
  Administered 2018-05-16 – 2018-05-18 (×5): 50 ug/h via INTRAVENOUS
  Filled 2018-05-16 (×5): qty 1

## 2018-05-16 MED ORDER — LACTATED RINGERS IV SOLN
INTRAVENOUS | Status: DC
  Administered 2018-05-16 – 2018-05-18 (×4): via INTRAVENOUS

## 2018-05-16 MED ORDER — SODIUM CHLORIDE 0.9 % IV SOLN
8.0000 mg/h | INTRAVENOUS | Status: DC
  Administered 2018-05-16 – 2018-05-18 (×5): 8 mg/h via INTRAVENOUS
  Filled 2018-05-16 (×5): qty 80

## 2018-05-16 MED ORDER — ONDANSETRON HCL 4 MG PO TABS: 4.0000 mg | ORAL_TABLET | Freq: Four times a day (QID) | ORAL | Status: DC | PRN

## 2018-05-16 MED ORDER — SODIUM CHLORIDE 0.9 % IV SOLN
1.0000 g | Freq: Once | INTRAVENOUS | Status: AC
  Administered 2018-05-16: 1 g via INTRAVENOUS
  Filled 2018-05-16: qty 10

## 2018-05-16 MED ORDER — ONDANSETRON HCL 4 MG/2ML IJ SOLN: 4.0000 mg | Freq: Four times a day (QID) | INTRAMUSCULAR | Status: DC | PRN

## 2018-05-16 NOTE — ED Notes (Signed)
Pt provided lunch of clear liquid diet at this time

## 2018-05-16 NOTE — ED Notes (Signed)
Sprite provided for pt

## 2018-05-16 NOTE — Consult Note (Signed)
Jonathon Bellows , MD [address] [place], [place], [place], [address] [address], [place], [place], [place], [address] Phone: [phone]  Fax: [phone]  Consultation ER Primary Care Physician:  Jinny Sanders, MD Primary Gastroenterologist:  Dr. Hilarie Fredrickson         Reason for Consultation:     GI bleed   Date of Admission:  05/15/2018 Date of Consultation:  05/16/2018         HPI:   Brianna Barnes is a 85 y.o. female is a patient of [facility].  For the last office note she carries a diagnosis of cryptogenic cirrhosis with portal hypertension, splenomegaly, small varices.  She has had stones in the common bile duct extracted by ERCP in the past.  Not been on beta-blockers due to risks of falls.  She was compensated at that point of time.  Per notes last upper endoscopy was in 2014.  She was admitted early this morning with hematemesis.  Was empirically started on Protonix and octreotide for an upper GI bleed and admitted.  Admission hemoglobin is 11.6 g with a platelet count of 169.  BUN of 12 creatinine of 0.52 no elevation in the ratio.  She had a CT scan of the abdomen in the ER which showed features of cirrhosis and portal hypertension.  Noted were paraesophageal and gastric varices and a recanalized umbilical vein.  INR on admission is 1.1.  Albumin on admission was 4 g.  She states that all of a sudden at 8 PM last night she went to the restroom felt sick and threw up large quantity of blood.  Did not recur subsequently she has not had a bowel movement since yesterday.  Denies any abdominal pain.  Denies use of any NSAIDs.  She was in the room with her family members when I spoke to her.    Past Medical History:  Diagnosis Date  . Choledocholithiasis   . Cirrhosis, cryptogenic (HCC)    FOLLOWED BY DR PYRTLE-- LOV  IN EPIC--  STABLE PER NOTE  . Cryptogenic cirrhosis ([place])   . Esophageal varix ([place])   . GERD (gastroesophageal reflux disease)   . History of  colonoscopy with polypectomy    ADENOMATOUS AND HYPERPLASIA POLYPS  (2008  &  2010)  . History of hepatitis C   . History of positive PPD    + TB SKIN TEST WITHOUT TB  . Hyperlipidemia   . Hypertension, portal ([place])    SECONDARY TO CRYPTOGENIC CIRRHOSIS  . Internal hemorrhoids   . Iron deficiency anemia   . Loss of hearing    right ear  . Osteoarthritis    hands, knees, and low back  . Osteoporosis   . PUD (peptic ulcer disease)   . Tubular adenoma of colon   . Uterovaginal prolapse, incomplete   . Wears glasses   . Wears hearing aid    left ear only    Past Surgical History:  Procedure Laterality Date  . APPENDECTOMY  1960'S  . CHOLECYSTECTOMY  08/25/2011   Procedure: LAPAROSCOPIC CHOLECYSTECTOMY WITH INTRAOPERATIVE CHOLANGIOGRAM;  Surgeon: Zenovia Jarred, MD;  Location: [facility];  Service: General;  Laterality: N/A;  . ENDOSCOPIC RETROGRADE CHOLANGIOPANCREATOGRAPHY (ERCP) WITH PROPOFOL N/A 11/30/2012   Procedure: ENDOSCOPIC RETROGRADE CHOLANGIOPANCREATOGRAPHY (ERCP) WITH PROPOFOL;  Surgeon: Milus Banister, MD;  Location[facility];  Service: Endoscopy;  Laterality: N/A;  . EXTERNAL EAR SURGERY Left 1980  . HYSTEROSCOPY W/D&C N/A 07/15/2014   Procedure: DILATATION AND  CURETTAGE /HYSTEROSCOPY;  Surgeon: Molli Posey, MD;  Location: [facility];  Service: Gynecology;  Laterality: N/A;  . INTRAMEDULLARY (IM) NAIL INTERTROCHANTERIC Left 01/22/2018   Procedure: ORIF LEFT INTERTROCHANTRIC HIP FX;  Surgeon: Mcarthur Rossetti, MD;  Location: [facility];  Service: Orthopedics;  Laterality: Left;  . KNEE ARTHROSCOPY W/ MENISCECTOMY Right 06-19-2002  . [place] SURGERY  1990  . REMOVAL OF URINARY SLING N/A 09/13/2013   Procedure: REMOVAL OF RETAINED PESSARY;  Surgeon: Jorja Loa, MD;  Location: [facility];  Service: Urology;  Laterality: N/A;    Prior to Admission medications   Medication Sig Start Date End Date Taking? Authorizing Provider    alendronate (FOSAMAX) 70 MG tablet TAKE 1 TABLET BY MOUTH ONCE A WEEK, AS DIRECTED 04/17/18  Yes Bedsole, [name] E, MD  apixaban (ELIQUIS) 5 MG TABS tablet Take 1 tablet (5 mg total) by mouth 2 (two) times daily. 03/09/18  Yes Schnier, Dolores Lory, MD  Calcium-Phosphorus-Vitamin D (CALCIUM GUMMIES PO) Take 1 tablet by mouth daily.   Yes [provider]  diclofenac sodium (VOLTAREN) 1 % GEL Apply 4 g topically 4 (four) times daily. 03/27/18  Yes Bedsole, [name] E, MD  dimenhyDRINATE (DRAMAMINE) 50 MG tablet Take 50 mg by mouth every 8 (eight) hours as needed for dizziness.   Yes [provider]  ibuprofen (ADVIL,MOTRIN) 200 MG tablet Take 400 mg by mouth daily as needed for headache or mild pain.    Yes [provider]  Iron Combinations (IRON COMPLEX PO) Take 1 tablet by mouth daily.   Yes [provider]  lidocaine (LIDODERM) 5 % Place 1 patch onto the skin daily. Remove & Discard patch within 12 hours or as directed by MD 11/04/17  Yes Bedsole, [name] E, MD  pantoprazole (PROTONIX) 40 MG tablet Take 1 tablet (40 mg total) by mouth daily before breakfast. 02/15/18  Yes Bedsole, [name] E, MD  sennosides-docusate sodium (SENOKOT-S) 8.6-50 MG tablet Take 1 tablet by mouth daily as needed for constipation.   Yes [provider]  timolol (TIMOPTIC) 0.5 % ophthalmic solution Place 1 drop into both eyes daily.  01/22/15  Yes [provider]  traMADol (ULTRAM) 50 MG tablet TAKE ONE-HALF TO ONE TABLET BY MOUTH UP TO EVERY 12 HOURS AS NEEDED FOR MODERATE PAIN. 04/24/18  Yes Copland, Frederico Hamman, MD  traZODone (DESYREL) 150 MG tablet Take 1 tablet (150 mg total) by mouth at bedtime. 04/17/18  Yes Bedsole, [name] E, MD  vitamin E 400 UNIT capsule Take 400 Units by mouth daily.   Yes [provider]  simethicone (MYLICON) 220 MG chewable tablet Chew 125 mg by mouth every 6 (six) hours as needed for flatulence.    [provider]    Family History  Problem Relation  Age of Onset  . Heart disease Mother   . Heart disease Father 61  . Hyperlipidemia Father   . Diabetes Father   . Cirrhosis Sister   . Diabetes Brother   . Heart disease Brother      Social History   Tobacco Use  . Smoking status: Never Smoker  . Smokeless tobacco: Never Used  Substance Use Topics  . Alcohol use: No  . Drug use: No    Allergies as of 05/15/2018  . (No Known Allergies)    Review of Systems:    All systems reviewed and negative except where noted in HPI.   Physical Exam:  Vital signs in last 24 hours:  Temp:  [98.8 F (37.1 C)] 98.8 F (37.1 C) (03/02 2111) Pulse Rate:  [67-97] 75 (03/03 0801) Resp:  [18] 18 (03/02 2111) BP: (121-177)/(61-81) 138/76 (03/03 0800) SpO2:  [83 %-100 %] 100 % (03/03 0801) Weight:  [48.5 kg] 48.5 kg (03/02 2112)   General:   Pleasant, cooperative in NAD Head:  Normocephalic and atraumatic. Eyes:   No icterus.   Conjunctiva pink. PERRLA. Ears:  Normal auditory acuity. Neck:  Supple; no masses or thyroidomegaly Lungs: Respirations even and unlabored. Lungs clear to auscultation bilaterally.   No wheezes, crackles, or rhonchi.  Heart:  Regular rate and rhythm;  Without murmur, clicks, rubs or gallops Abdomen:  Soft, nondistended, nontender. Normal bowel sounds. No appreciable masses or hepatomegaly.  No rebound or guarding.  Neurologic:  Alert and oriented x3;  grossly normal neurologically. Skin:  Intact without significant lesions or rashes. Cervical Nodes:  No significant cervical adenopathy. Psych:  Alert and cooperative. Normal affect.  LAB RESULTS: Recent Labs    05/15/18 2120 05/16/18 0400  WBC 5.5 4.4  HGB 11.6* 10.6*  HCT 35.4* 32.6*  PLT 169 144*   BMET Recent Labs    05/15/18 2120 05/16/18 0400  NA 140 140  K 3.5 3.8  CL 107 109  CO2 26 23  GLUCOSE 98 97  BUN 12 10  CREATININE 0.52 0.39*  CALCIUM 8.3* 7.6*   LFT Recent Labs    05/16/18 0400  PROT 5.5*  ALBUMIN 3.3*  AST 33  ALT 20   ALKPHOS 75  BILITOT 0.3   PT/INR Recent Labs    05/15/18 2203 05/16/18 0503  LABPROT 13.7 13.6  INR 1.1 1.1    STUDIES: Ct Abdomen Pelvis W Contrast  Result Date: 05/15/2018 CLINICAL DATA:  One episode of bloody emesis. Patient is on blood thinners. EXAM: CT ABDOMEN AND PELVIS WITH CONTRAST TECHNIQUE: Multidetector CT imaging of the abdomen and pelvis was performed using the standard protocol following bolus administration of intravenous contrast. CONTRAST:  54mL OMNIPAQUE IOHEXOL 300 MG/ML  SOLN COMPARISON:  MRI of the abdomen 06/24/2014 FINDINGS: Lower chest: Mild cardiomegaly without pericardial effusion. Coronary arteriosclerosis is noted. [place] off a geode varices in small hiatal hernia is identified. Hepatobiliary: Morphologic changes of cirrhosis with enlarged left hepatic lobe, recanalized umbilical vein but without intrahepatic mass or biliary dilatation. Cysts are noted of the right hepatic lobe. The patient is status post cholecystectomy. Pneumobilia likely from prior intervention is noted. Pancreas: No ductal dilatation, mass or inflammation. Spleen: The spleen measures 10.9 x 3.6 x 9.6 cm (volume = 200 cm^3). No splenic mass. Adrenals/Urinary Tract: Normal bilateral adrenal glands. No renal mass nor obstructive uropathy. No hydroureteronephrosis. The urinary bladder is unremarkable. Stomach/Bowel: Moderate-sized hiatal hernia. Physiologic distention of the stomach. The duodenal sweep and ligament of Treitz are normal. Scattered mild fluid distention of small bowel loops without mechanical obstruction. Moderate stool retention within the colon without inflammation. Scattered colonic diverticulosis without acute diverticulitis is identified. Normal-appearing appendix. Vascular/Lymphatic: Aortoiliac and branch vessel atherosclerosis. No aneurysm. Extensive paraesophageal and gastric varices. Recanalized umbilical vein. Reproductive: Hysterectomy. No adnexal mass. Cerclage device in place.  Other: No free air nor ascites. Musculoskeletal: Left femoral nail fixation. Degenerative disc disease L4-5. IMPRESSION: 1. Morphologic changes of cirrhosis with stigmata of portal hypertension including paraesophageal and gastric varices and recannulized umbilical vein. No evidence of active hemorrhage. 2. Status post cholecystectomy. Pneumobilia likely from prior intervention. Stable right-sided hepatic cysts. 3. Aortoiliac and branch vessel atherosclerosis. 4. Colonic diverticulosis without acute  diverticulitis. 5. Degenerative disc disease L4-5. Left femoral nail fixation. Electronically Signed   By: Ashley Royalty M.D.   On: 05/15/2018 23:34      Impression / Plan:   Brianna Barnes is a 85 y.o. y/o female with history of cryptogenic cirrhosis, last upper endoscopy in 2014 which demonstrated small esophageal varices.  She presents to the emergency room with hematemesis.  No change in the BUN/creatinine ratio.  Hemoglobin is stable at 11.6 g.  INR is not elevated at 1.1.  She has been on Eliquis.  Weighing the risks and benefits of endoscopy  complicated by the fact that she is on Eliquis and this patient may require banding.  Probably the better option would be to defer this patient for endoscopy when she is at least t 48 hours off Eliquis and plan for Thursday.  In the meanwhile I would continue to treat her with octreotide, PPI antibiotics for SBP prophylaxis.  I would keep her n.p.o. for the next 8 to 12 hours to ensure that the hemoglobin is stable and she is not having any further hematemesis.  Subsequently can start on clears and we will reevaluate tomorrow the timing of endoscopy.  Hold Eliquis at this point of time.  Thank you for involving me in the care of this patient.      LOS: 0 days   Jonathon Bellows, MD  05/16/2018, 9:07 AM

## 2018-05-16 NOTE — H&P (Signed)
Brianna Barnes at [place] NAME: Brianna Barnes    MR#:  [id number]  DATE OF BIRTH:  Nov 25, 1928  DATE OF ADMISSION:  05/15/2018  PRIMARY CARE PHYSICIAN: Jinny Sanders, MD   REQUESTING/REFERRING PHYSICIAN: Dr. Owens Shark  CHIEF COMPLAINT:   Chief Complaint  Patient presents with  . Hematemesis    HISTORY OF PRESENT ILLNESS:  Brianna Barnes  is a 85 y.o. female with a known history listed below presented to emergency room for evaluation of bloody emesis.  Patient has 1 episode of bloody emesis tonight.  Patient is complaining of epigastric pain.  Patient denies headache or dizziness.  Patient had mild nausea earlier today.  Currently denies nausea or vomiting.  Patient is on blood thinners.  Patient is hard of hearing.  No other complaints.  In emergency room patient labs and vitals stable.  CT scan done that suggestive of liver cirrhosis and esophageal varices.  Patient started on Protonix and octreotide drip.  Hospitalist team requested for admission.  PAST MEDICAL HISTORY:   Past Medical History:  Diagnosis Date  . Choledocholithiasis   . Cirrhosis, cryptogenic (HCC)    FOLLOWED BY DR PYRTLE-- LOV  IN EPIC--  STABLE PER NOTE  . Cryptogenic cirrhosis ([place])   . Esophageal varix ([place])   . GERD (gastroesophageal reflux disease)   . History of colonoscopy with polypectomy    ADENOMATOUS AND HYPERPLASIA POLYPS  (2008  &  2010)  . History of hepatitis C   . History of positive PPD    + TB SKIN TEST WITHOUT TB  . Hyperlipidemia   . Hypertension, portal ([place])    SECONDARY TO CRYPTOGENIC CIRRHOSIS  . Internal hemorrhoids   . Iron deficiency anemia   . Loss of hearing    right ear  . Osteoarthritis    hands, knees, and low back  . Osteoporosis   . PUD (peptic ulcer disease)   . Tubular adenoma of colon   . Uterovaginal prolapse, incomplete   . Wears glasses   . Wears hearing aid    left ear only    PAST SURGICAL HISTORY:   Past Surgical  History:  Procedure Laterality Date  . APPENDECTOMY  1960'S  . CHOLECYSTECTOMY  08/25/2011   Procedure: LAPAROSCOPIC CHOLECYSTECTOMY WITH INTRAOPERATIVE CHOLANGIOGRAM;  Surgeon: Zenovia Jarred, MD;  Location: [facility];  Service: General;  Laterality: N/A;  . ENDOSCOPIC RETROGRADE CHOLANGIOPANCREATOGRAPHY (ERCP) WITH PROPOFOL N/A 11/30/2012   Procedure: ENDOSCOPIC RETROGRADE CHOLANGIOPANCREATOGRAPHY (ERCP) WITH PROPOFOL;  Surgeon: Milus Banister, MD;  [facility];  Service: Endoscopy;  Laterality: N/A;  . EXTERNAL EAR SURGERY Left 1980  . HYSTEROSCOPY W/D&C N/A 07/15/2014   Procedure: DILATATION AND CURETTAGE /HYSTEROSCOPY;  Surgeon: Molli Posey, MD;  Location[facility];  Service: Gynecology;  Laterality: N/A;  . INTRAMEDULLARY (IM) NAIL INTERTROCHANTERIC Left 01/22/2018   Procedure: ORIF LEFT INTERTROCHANTRIC HIP FX;  Surgeon: Mcarthur Rossetti, MD;  Location: [facility];  Service: Orthopedics;  Laterality: Left;  . KNEE ARTHROSCOPY W/ MENISCECTOMY Right 06-19-2002  . [place] SURGERY  1990  . REMOVAL OF URINARY SLING N/A 09/13/2013   Procedure: REMOVAL OF RETAINED PESSARY;  Surgeon: Jorja Loa, MD;  Location: [facility];  Service: Urology;  Laterality: N/A;    SOCIAL HISTORY:   Social History   Tobacco Use  . Smoking status: Never Smoker  . Smokeless tobacco: Never Used  Substance Use Topics  . Alcohol use: No  FAMILY HISTORY:   Family History  Problem Relation Age of Onset  . Heart disease Mother   . Heart disease Father 68  . Hyperlipidemia Father   . Diabetes Father   . Cirrhosis Sister   . Diabetes Brother   . Heart disease Brother     DRUG ALLERGIES:  No Known Allergies  REVIEW OF SYSTEMS:   ROS -Limited secondary to hard of hearing.  Positive as per HPI otherwise negative  MEDICATIONS AT HOME:   Prior to Admission medications   Medication Sig Start Date End Date Taking? Authorizing Provider    alendronate (FOSAMAX) 70 MG tablet TAKE 1 TABLET BY MOUTH ONCE A WEEK, AS DIRECTED 04/17/18  Yes Bedsole, [name] E, MD  apixaban (ELIQUIS) 5 MG TABS tablet Take 1 tablet (5 mg total) by mouth 2 (two) times daily. 03/09/18  Yes Schnier, Dolores Lory, MD  Calcium-Phosphorus-Vitamin D (CALCIUM GUMMIES PO) Take 1 tablet by mouth daily.   Yes [provider]  diclofenac sodium (VOLTAREN) 1 % GEL Apply 4 g topically 4 (four) times daily. 03/27/18  Yes Bedsole, [name] E, MD  dimenhyDRINATE (DRAMAMINE) 50 MG tablet Take 50 mg by mouth every 8 (eight) hours as needed for dizziness.   Yes [provider]  ibuprofen (ADVIL,MOTRIN) 200 MG tablet Take 400 mg by mouth daily as needed for headache or mild pain.    Yes [provider]  Iron Combinations (IRON COMPLEX PO) Take 1 tablet by mouth daily.   Yes [provider]  lidocaine (LIDODERM) 5 % Place 1 patch onto the skin daily. Remove & Discard patch within 12 hours or as directed by MD 11/04/17  Yes Bedsole, [name] E, MD  pantoprazole (PROTONIX) 40 MG tablet Take 1 tablet (40 mg total) by mouth daily before breakfast. 02/15/18  Yes Bedsole, [name] E, MD  sennosides-docusate sodium (SENOKOT-S) 8.6-50 MG tablet Take 1 tablet by mouth daily as needed for constipation.   Yes [provider]  timolol (TIMOPTIC) 0.5 % ophthalmic solution Place 1 drop into both eyes daily.  01/22/15  Yes [provider]  traMADol (ULTRAM) 50 MG tablet TAKE ONE-HALF TO ONE TABLET BY MOUTH UP TO EVERY 12 HOURS AS NEEDED FOR MODERATE PAIN. 04/24/18  Yes Copland, Frederico Hamman, MD  traZODone (DESYREL) 150 MG tablet Take 1 tablet (150 mg total) by mouth at bedtime. 04/17/18  Yes Bedsole, [name] E, MD  vitamin E 400 UNIT capsule Take 400 Units by mouth daily.   Yes [provider]  simethicone (MYLICON) 960 MG chewable tablet Chew 125 mg by mouth every 6 (six) hours as needed for flatulence.    [provider]      VITAL SIGNS:  Blood pressure (!)  173/79, pulse 97, temperature 98.8 F (37.1 C), temperature source Oral, resp. rate 18, height 4\' 10"  (1.473 m), weight 48.5 kg, SpO2 98 %.  PHYSICAL EXAMINATION:  Physical Exam  GENERAL:  85 y.o.-year-old patient lying in the bed with no acute distress.  EYES: Pupils equal, round, reactive to light and accommodation. No scleral icterus. Extraocular muscles intact.  HEENT: Head atraumatic, normocephalic. Oropharynx and nasopharynx clear.  NECK:  Supple, no jugular venous distention. No thyroid enlargement, no tenderness.  LUNGS: Normal breath sounds bilaterally, no wheezing, rales,rhonchi or crepitation. No use of accessory muscles of respiration.  CARDIOVASCULAR: S1, S2 normal. No murmurs, rubs, or gallops.  ABDOMEN: Soft, mild epigastric tenderness, nondistended. Bowel sounds present. No organomegaly or mass.  EXTREMITIES: No pedal edema, cyanosis, or clubbing.  NEUROLOGIC: Cranial nerves II through XII are intact. Muscle strength 5/5 in all extremities. Sensation intact. Gait not checked.  PSYCHIATRIC: The patient is alert and oriented x 3.  SKIN: No obvious rash, lesion, or ulcer.   LABORATORY PANEL:   CBC Recent Labs  Lab 05/15/18 2120  WBC 5.5  HGB 11.6*  HCT 35.4*  PLT 169   ------------------------------------------------------------------------------------------------------------------  Chemistries  Recent Labs  Lab 05/15/18 2120  NA 140  K 3.5  CL 107  CO2 26  GLUCOSE 98  BUN 12  CREATININE 0.52  CALCIUM 8.3*  AST 39  ALT 24  ALKPHOS 88  BILITOT 0.8   ------------------------------------------------------------------------------------------------------------------  Cardiac Enzymes No results for input(s): TROPONINI in the last 168 hours. ------------------------------------------------------------------------------------------------------------------  RADIOLOGY:  Ct Abdomen Pelvis W Contrast  Result Date: 05/15/2018 CLINICAL DATA:  One episode of  bloody emesis. Patient is on blood thinners. EXAM: CT ABDOMEN AND PELVIS WITH CONTRAST TECHNIQUE: Multidetector CT imaging of the abdomen and pelvis was performed using the standard protocol following bolus administration of intravenous contrast. CONTRAST:  57mL OMNIPAQUE IOHEXOL 300 MG/ML  SOLN COMPARISON:  MRI of the abdomen 06/24/2014 FINDINGS: Lower chest: Mild cardiomegaly without pericardial effusion. Coronary arteriosclerosis is noted. [place] off a geode varices in small hiatal hernia is identified. Hepatobiliary: Morphologic changes of cirrhosis with enlarged left hepatic lobe, recanalized umbilical vein but without intrahepatic mass or biliary dilatation. Cysts are noted of the right hepatic lobe. The patient is status post cholecystectomy. Pneumobilia likely from prior intervention is noted. Pancreas: No ductal dilatation, mass or inflammation. Spleen: The spleen measures 10.9 x 3.6 x 9.6 cm (volume = 200 cm^3). No splenic mass. Adrenals/Urinary Tract: Normal bilateral adrenal glands. No renal mass nor obstructive uropathy. No hydroureteronephrosis. The urinary bladder is unremarkable. Stomach/Bowel: Moderate-sized hiatal hernia. Physiologic distention of the stomach. The duodenal sweep and ligament of Treitz are normal. Scattered mild fluid distention of small bowel loops without mechanical obstruction. Moderate stool retention within the colon without inflammation. Scattered colonic diverticulosis without acute diverticulitis is identified. Normal-appearing appendix. Vascular/Lymphatic: Aortoiliac and branch vessel atherosclerosis. No aneurysm. Extensive paraesophageal and gastric varices. Recanalized umbilical vein. Reproductive: Hysterectomy. No adnexal mass. Cerclage device in place. Other: No free air nor ascites. Musculoskeletal: Left femoral nail fixation. Degenerative disc disease L4-5. IMPRESSION: 1. Morphologic changes of cirrhosis with stigmata of portal hypertension including paraesophageal and  gastric varices and recannulized umbilical vein. No evidence of active hemorrhage. 2. Status post cholecystectomy. Pneumobilia likely from prior intervention. Stable right-sided hepatic cysts. 3. Aortoiliac and branch vessel atherosclerosis. 4. Colonic diverticulosis without acute diverticulitis. 5. Degenerative disc disease L4-5. Left femoral nail fixation. Electronically Signed   By: Ashley Royalty M.D.   On: 05/15/2018 23:34      IMPRESSION AND PLAN:   1.  Upper GI bleed: CT scan suggestive of esophageal and gastric varices.  Continue Protonix and octreotide drip.  1 dose of IV Rocephin.  Monitor H&H.  Transfuse as indicated.  GI consult requested.  Follow-up recommendation.  Keep patient n.p.o.  Symptomatic treatment.  2.  History of liver cirrhosis: Follow-up GI recommendations  3.  History of DVT on Eliquis: Hold Eliquis secondary to GI bleed.  4.  Chronic other medical problems: Monitor.  Currently holding oral medication secondary to n.p.o. status.  Resume as indicated.  High risk secondary to above  Estimated length of stay more than 2 midnights  Further treatment based on clinical course  All the records are reviewed and case discussed with ED provider.  Management plans discussed with the patient, family and they are in agreement.  CODE STATUS: DNR  TOTAL TIME TAKING CARE OF THIS PATIENT: 40 minutes.    Sedalia Muta M.D on 05/16/2018 at 1:43 AM  Between 7am to 6pm - Pager - [phone]  After 6pm go to [email or web address] - Proofreader  [facility] [place] [facility]  Office  [phone]  CC: Primary care physician; Jinny Sanders, MD

## 2018-05-16 NOTE — Progress Notes (Signed)
Admitted this morning for hematemesis, patient has history of liver cirrhosis, CT abdomen showed portal hypertension, paraesophageal and gastric varices.  No further hematemesis after admission, hemoglobin stable around 10.6. #1, hematemesis with GI bleed, hemodynamically stable at this time, still with stable hemoglobin, likely secondary to portal hypertension, esophageal and gastric varices.  Patient currently on Protonix, octreotide drip, GI consult is requested.  I spoke with Dr. Caleen Jobs via epic text message.  Patient is on IV fluids, continue n.p.o., IV fluids, waiting for GI recommendation to see if we can start her on clear liquid, patient had EGD long time back, may need another EGD this time.  May need to start her on low-dose beta-blocker  #2 history of DVT on Eliquis, hold Eliquis secondary to GI bleed. 3.  Other medications, labs reviewed.

## 2018-05-16 NOTE — ED Notes (Signed)
ED TO INPATIENT HANDOFF REPORT  ED Nurse Name and Phone #: Alben Jepsen [phone]  S Name/Age/Gender Brianna Barnes 85 y.o. female Room/Bed: ED13A/ED13A  Code Status   Code Status: DNR  Home/SNF/Other Home Patient oriented to: self, place, time and situation Is this baseline? Yes   Triage Complete: Triage complete  Chief Complaint Emesis w Blood  Triage Note Pt to the er for an episode of bloody emesis. Pt is on blood thinners. Pt reports only only episode. Denies dizziness. Pt reports some nausea today and has been able to eat.   Allergies No Known Allergies  Level of Care/Admitting Diagnosis ED Disposition    ED Disposition Condition [facility] Area: [place] [100120]  Level of Care: Telemetry [5]  Diagnosis: GI bleed [923300]  Admitting Physician: Sedalia Muta [[id number]]  Attending Physician: Sedalia Muta [[id number]]  Estimated length of stay: 3 - 4 days  Certification:: I certify this patient will need inpatient services for at least 2 midnights  PT Class (Do Not Modify): Inpatient [101]  PT Acc Code (Do Not Modify): Private [1]       B Medical/Surgery History Past Medical History:  Diagnosis Date  . Choledocholithiasis   . Cirrhosis, cryptogenic (HCC)    FOLLOWED BY DR PYRTLE-- LOV  IN EPIC--  STABLE PER NOTE  . Cryptogenic cirrhosis ([place])   . Esophageal varix ([place])   . GERD (gastroesophageal reflux disease)   . History of colonoscopy with polypectomy    ADENOMATOUS AND HYPERPLASIA POLYPS  (2008  &  2010)  . History of hepatitis C   . History of positive PPD    + TB SKIN TEST WITHOUT TB  . Hyperlipidemia   . Hypertension, portal ([place])    SECONDARY TO CRYPTOGENIC CIRRHOSIS  . Internal hemorrhoids   . Iron deficiency anemia   . Loss of hearing    right ear  . Osteoarthritis    hands, knees, and low back  . Osteoporosis   . PUD (peptic ulcer disease)   . Tubular adenoma of colon   . Uterovaginal prolapse,  incomplete   . Wears glasses   . Wears hearing aid    left ear only   Past Surgical History:  Procedure Laterality Date  . APPENDECTOMY  1960'S  . CHOLECYSTECTOMY  08/25/2011   Procedure: LAPAROSCOPIC CHOLECYSTECTOMY WITH INTRAOPERATIVE CHOLANGIOGRAM;  Surgeon: Zenovia Jarred, MD;  Location: [facility];  Service: General;  Laterality: N/A;  . ENDOSCOPIC RETROGRADE CHOLANGIOPANCREATOGRAPHY (ERCP) WITH PROPOFOL N/A 11/30/2012   Procedure: ENDOSCOPIC RETROGRADE CHOLANGIOPANCREATOGRAPHY (ERCP) WITH PROPOFOL;  Surgeon: Milus Banister, MD;  Location: [facility];  Service: Endoscopy;  Laterality: N/A;  . EXTERNAL EAR SURGERY Left 1980  . HYSTEROSCOPY W/D&C N/A 07/15/2014   Procedure: DILATATION AND CURETTAGE /HYSTEROSCOPY;  Surgeon: Molli Posey, MD;  Location: [facility];  Service: Gynecology;  Laterality: N/A;  . INTRAMEDULLARY (IM) NAIL INTERTROCHANTERIC Left 01/22/2018   Procedure: ORIF LEFT INTERTROCHANTRIC HIP FX;  Surgeon: Mcarthur Rossetti, MD;  Location: [facility];  Service: Orthopedics;  Laterality: Left;  . KNEE ARTHROSCOPY W/ MENISCECTOMY Right 06-19-2002  . [place] SURGERY  1990  . REMOVAL OF URINARY SLING N/A 09/13/2013   Procedure: REMOVAL OF RETAINED PESSARY;  Surgeon: Jorja Loa, MD;  Location: [facility];  Service: Urology;  Laterality: N/A;     A IV Location/Drains/Wounds Patient Lines/Drains/Airways Status   Active Line/Drains/Airways    Name:   Placement date:   Placement  time:   Site:   Days:   Peripheral IV 05/15/18 Left Forearm   05/15/18    2230    Forearm   1   Peripheral IV 05/16/18   05/16/18    0356    -   less than 1   Incision (Closed) 01/22/18 Hip Left   01/22/18    1101     114          Intake/Output Last 24 hours No intake or output data in the 24 hours ending 05/16/18 1937  Labs/Imaging Results for orders placed or performed during the hospital encounter of 05/15/18 (from the past 48 hour(s))   Comprehensive metabolic panel     Status: Abnormal   Collection Time: 05/15/18  9:20 PM  Result Value Ref Range   Sodium 140 135 - 145 mmol/L   Potassium 3.5 3.5 - 5.1 mmol/L   Chloride 107 98 - 111 mmol/L   CO2 26 22 - 32 mmol/L   Glucose, Bld 98 70 - 99 mg/dL   BUN 12 8 - 23 mg/dL   Creatinine, Ser 0.52 0.44 - 1.00 mg/dL   Calcium 8.3 (L) 8.9 - 10.3 mg/dL   Total Protein 6.3 (L) 6.5 - 8.1 g/dL   Albumin 4.0 3.5 - 5.0 g/dL   AST 39 15 - 41 U/L   ALT 24 0 - 44 U/L   Alkaline Phosphatase 88 38 - 126 U/L   Total Bilirubin 0.8 0.3 - 1.2 mg/dL   GFR calc non Af Amer >60 >60 mL/min   GFR calc Af Amer >60 >60 mL/min   Anion gap 7 5 - 15    Comment: Performed at [facility], [address] [place], [place] [address]  CBC     Status: Abnormal   Collection Time: 05/15/18  9:20 PM  Result Value Ref Range   WBC 5.5 4.0 - 10.5 K/uL   RBC 3.92 3.87 - 5.11 MIL/uL   Hemoglobin 11.6 (L) 12.0 - 15.0 g/dL   HCT 35.4 (L) 36.0 - 46.0 %   MCV 90.3 80.0 - 100.0 fL   MCH 29.6 26.0 - 34.0 pg   MCHC 32.8 30.0 - 36.0 g/dL   RDW 14.3 11.5 - 15.5 %   Platelets 169 150 - 400 K/uL   nRBC 0.0 0.0 - 0.2 %    Comment: Performed at [facility], [address] [place], [place] [address]  Type and screen [place]     Status: None   Collection Time: 05/15/18  9:20 PM  Result Value Ref Range   ABO/RH(D) A POS    Antibody Screen NEG    Sample Expiration      05/18/2018 Performed at [facility], [address] [place], [place] [address]   Protime-INR     Status: None   Collection Time: 05/15/18 10:03 PM  Result Value Ref Range   Prothrombin Time 13.7 11.4 - 15.2 seconds   INR 1.1 0.8 - 1.2    Comment: (NOTE) INR goal varies based on device and disease states. Performed at [facility], [address] [place], [place] [address]   APTT     Status: None   Collection Time: 05/15/18 10:03 PM  Result Value Ref Range   aPTT 31 24 - 36  seconds    Comment: Performed at [facility], [address] [place], [place] [address]  CBC     Status: Abnormal   Collection Time: 05/16/18  4:00 AM  Result Value Ref Range   WBC 4.4 4.0 - 10.5 K/uL   RBC 3.61 (L) 3.87 - 5.11 MIL/uL   Hemoglobin 10.6 (L) 12.0 - 15.0 g/dL   HCT 32.6 (L) 36.0 - 46.0 %   MCV 90.3 80.0 - 100.0 fL   MCH 29.4 26.0 - 34.0 pg   MCHC 32.5 30.0 - 36.0 g/dL   RDW 14.2 11.5 - 15.5 %   Platelets 144 (L) 150 - 400 K/uL   nRBC 0.0 0.0 - 0.2 %    Comment: Performed at [facility], [address] [place], [place] [address]  Comprehensive metabolic panel     Status: Abnormal   Collection Time: 05/16/18  4:00 AM  Result Value Ref Range   Sodium 140 135 - 145 mmol/L   Potassium 3.8 3.5 - 5.1 mmol/L   Chloride 109 98 - 111 mmol/L   CO2 23 22 - 32 mmol/L   Glucose, Bld 97 70 - 99 mg/dL   BUN 10 8 - 23 mg/dL   Creatinine, Ser 0.39 (L) 0.44 - 1.00 mg/dL   Calcium 7.6 (L) 8.9 - 10.3 mg/dL   Total Protein 5.5 (L) 6.5 - 8.1 g/dL   Albumin 3.3 (L) 3.5 - 5.0 g/dL   AST 33 15 - 41 U/L   ALT 20 0 - 44 U/L   Alkaline Phosphatase 75 38 - 126 U/L   Total Bilirubin 0.3 0.3 - 1.2 mg/dL   GFR calc non Af Amer >60 >60 mL/min   GFR calc Af Amer >60 >60 mL/min   Anion gap 8 5 - 15    Comment: Performed at [facility], [address] [place], [place] [address]  Protime-INR     Status: None   Collection Time: 05/16/18  5:03 AM  Result Value Ref Range   Prothrombin Time 13.6 11.4 - 15.2 seconds   INR 1.1 0.8 - 1.2    Comment: (NOTE) INR goal varies based on device and disease states. Performed at [facility], [address] [place], [place] [address]   APTT     Status: None   Collection Time: 05/16/18  5:03 AM  Result Value Ref Range   aPTT 24 24 - 36 seconds    Comment: Performed at [facility], [address] [place], [place] [address]  Glucose, capillary     Status: Abnormal   Collection Time: 05/16/18  1:27  PM  Result Value Ref Range   Glucose-Capillary 103 (H) 70 - 99 mg/dL  Hemoglobin     Status: Abnormal   Collection Time: 05/16/18  1:28 PM  Result Value Ref Range   Hemoglobin 11.0 (L) 12.0 - 15.0 g/dL    Comment: Performed at [facility], [address] [place], [place] [address]  Hematocrit     Status: Abnormal   Collection Time: 05/16/18  1:28 PM  Result Value Ref Range   HCT 34.3 (L) 36.0 - 46.0 %    Comment: Performed at [facility], [address], [place], [place] [address]   Ct Abdomen Pelvis W Contrast  Result Date: 05/15/2018 CLINICAL DATA:  One episode of bloody emesis. Patient is on blood thinners. EXAM: CT ABDOMEN AND PELVIS WITH CONTRAST TECHNIQUE: Multidetector CT imaging of the abdomen and pelvis was performed using the standard protocol following bolus administration of intravenous contrast. CONTRAST:  15mL OMNIPAQUE IOHEXOL 300 MG/ML  SOLN COMPARISON:  MRI of the abdomen 06/24/2014 FINDINGS: Lower chest: Mild cardiomegaly  without pericardial effusion. Coronary arteriosclerosis is noted. [place] off a geode varices in small hiatal hernia is identified. Hepatobiliary: Morphologic changes of cirrhosis with enlarged left hepatic lobe, recanalized umbilical vein but without intrahepatic mass or biliary dilatation. Cysts are noted of the right hepatic lobe. The patient is status post cholecystectomy. Pneumobilia likely from prior intervention is noted. Pancreas: No ductal dilatation, mass or inflammation. Spleen: The spleen measures 10.9 x 3.6 x 9.6 cm (volume = 200 cm^3). No splenic mass. Adrenals/Urinary Tract: Normal bilateral adrenal glands. No renal mass nor obstructive uropathy. No hydroureteronephrosis. The urinary bladder is unremarkable. Stomach/Bowel: Moderate-sized hiatal hernia. Physiologic distention of the stomach. The duodenal sweep and ligament of Treitz are normal. Scattered mild fluid distention of small bowel loops without mechanical obstruction.  Moderate stool retention within the colon without inflammation. Scattered colonic diverticulosis without acute diverticulitis is identified. Normal-appearing appendix. Vascular/Lymphatic: Aortoiliac and branch vessel atherosclerosis. No aneurysm. Extensive paraesophageal and gastric varices. Recanalized umbilical vein. Reproductive: Hysterectomy. No adnexal mass. Cerclage device in place. Other: No free air nor ascites. Musculoskeletal: Left femoral nail fixation. Degenerative disc disease L4-5. IMPRESSION: 1. Morphologic changes of cirrhosis with stigmata of portal hypertension including paraesophageal and gastric varices and recannulized umbilical vein. No evidence of active hemorrhage. 2. Status post cholecystectomy. Pneumobilia likely from prior intervention. Stable right-sided hepatic cysts. 3. Aortoiliac and branch vessel atherosclerosis. 4. Colonic diverticulosis without acute diverticulitis. 5. Degenerative disc disease L4-5. Left femoral nail fixation. Electronically Signed   By: Ashley Royalty M.D.   On: 05/15/2018 23:34    Pending Labs Unresulted Labs (From admission, onward)   None      Vitals/Pain Today's Vitals   05/16/18 0801 05/16/18 0900 05/16/18 1000 05/16/18 1333  BP:  (!) 163/78 (!) 143/94 (!) 144/69  Pulse: 75 76 72 73  Resp:    16  Temp:      TempSrc:      SpO2: 100% 100% 98% 100%  Weight:      Height:      PainSc:    0-No pain    Isolation Precautions No active isolations  Medications Medications  ondansetron (ZOFRAN) tablet 4 mg (has no administration in time range)    Or  ondansetron (ZOFRAN) injection 4 mg (has no administration in time range)  sodium chloride flush (NS) 0.9 % injection 3 mL (3 mLs Intravenous Not Given 05/16/18 1059)  lactated ringers infusion ( Intravenous New Bag/Given 05/16/18 0358)  pantoprazole (PROTONIX) 80 mg in sodium chloride 0.9 % 250 mL (0.32 mg/mL) infusion (8 mg/hr Intravenous New Bag/Given 05/16/18 1639)  octreotide (SANDOSTATIN) 500  mcg in sodium chloride 0.9 % 250 mL (2 mcg/mL) infusion (50 mcg/hr Intravenous New Bag/Given 05/16/18 1450)  ondansetron (ZOFRAN-ODT) disintegrating tablet 4 mg (4 mg Oral Given 05/15/18 2119)  sodium chloride 0.9 % bolus 500 mL (0 mLs Intravenous Stopped 05/16/18 0310)  iohexol (OMNIPAQUE) 300 MG/ML solution 75 mL (75 mLs Intravenous Contrast Given 05/15/18 2310)  cefTRIAXone (ROCEPHIN) 1 g in sodium chloride 0.9 % 100 mL IVPB (0 g Intravenous Stopped 05/16/18 0310)    Mobility walks Low fall risk   Focused Assessments GI: soft nontender, 1 BM today Room air, NSR   R Recommendations: See Admitting Provider Note  Report given to:   Additional Notes: Pt able to take self to restroom, family at bedside. Permission given to RN to speak with a family member's SO named Iris if she calls.

## 2018-05-16 NOTE — ED Notes (Signed)
Pt unhooked to ambulate to restroom

## 2018-05-16 NOTE — ED Notes (Signed)
Pt observed periodically sating between 70s-80s while resting. Pt denies hx of sleep apnea. Placed on 2Ls [place]. MD made aware.

## 2018-05-16 NOTE — ED Notes (Signed)
Pt assisted to bathroom

## 2018-05-17 DIAGNOSIS — K7469 Other cirrhosis of liver: Secondary | ICD-10-CM

## 2018-05-17 LAB — GLUCOSE, CAPILLARY: Glucose-Capillary: 109 mg/dL — ABNORMAL HIGH (ref 70–99)

## 2018-05-17 MED ORDER — ACETAMINOPHEN 325 MG PO TABS: 650.0000 mg | ORAL_TABLET | Freq: Four times a day (QID) | ORAL | Status: DC | PRN

## 2018-05-17 MED ORDER — TIMOLOL MALEATE 0.5 % OP SOLN
1.0000 [drp] | Freq: Every day | OPHTHALMIC | Status: DC
  Administered 2018-05-17 – 2018-05-20 (×4): 1 [drp] via OPHTHALMIC
  Filled 2018-05-17 (×2): qty 5

## 2018-05-17 MED ORDER — CIPROFLOXACIN IN D5W 400 MG/200ML IV SOLN
400.0000 mg | Freq: Two times a day (BID) | INTRAVENOUS | Status: DC
  Administered 2018-05-17 – 2018-05-18 (×3): 400 mg via INTRAVENOUS
  Filled 2018-05-17 (×5): qty 200

## 2018-05-17 MED ORDER — SENNOSIDES-DOCUSATE SODIUM 8.6-50 MG PO TABS
1.0000 | ORAL_TABLET | Freq: Every day | ORAL | Status: DC
  Administered 2018-05-17 – 2018-05-20 (×3): 1 via ORAL
  Filled 2018-05-17 (×3): qty 1

## 2018-05-17 MED ORDER — TRAZODONE HCL 50 MG PO TABS
150.0000 mg | ORAL_TABLET | Freq: Every day | ORAL | Status: DC
  Administered 2018-05-17 – 2018-05-19 (×3): 150 mg via ORAL
  Filled 2018-05-17 (×3): qty 1

## 2018-05-17 MED ORDER — TRAMADOL HCL 50 MG PO TABS: 50.0000 mg | ORAL_TABLET | Freq: Four times a day (QID) | ORAL | Status: DC | PRN

## 2018-05-17 NOTE — Progress Notes (Signed)
protonix and octreotide drips infusing,awaiting GI imput,LR infusing,clear liquid diet

## 2018-05-17 NOTE — Progress Notes (Signed)
Notify Dr. Vianne Bulls about patient and family asking about the plan EGD tomorrow? Per MD to call GI, tried to called Dr. Alice Reichert which is on call, left a message, awaiting for MD to call back. RN will continue to monitor.

## 2018-05-17 NOTE — Progress Notes (Signed)
Notify Dr. Vianne Bulls about patient's takes trazodone 150 mg at home and is requesting while she is in the hospital as she did not sleep last night, order given.   Also Dr. Marius Ditch did response and talked to patient and family about a plan EGD tomorrow. Will place patient NPO after midnight. RN will continue to monitor.

## 2018-05-17 NOTE — Progress Notes (Signed)
[place] [facility] [place] NAME: Brianna Barnes    MR#:  [id number]  DATE OF BIRTH:  Aug 04, 1928  SUBJECTIVE: Patient is feeling much better, no further hematemesis.  CHIEF COMPLAINT:   Chief Complaint  Patient presents with  . Hematemesis    REVIEW OF SYSTEMS:   ROS CONSTITUTIONAL: No fever, fatigue or weakness.  EYES: No blurred or double vision.  EARS, NOSE, AND THROAT: No tinnitus or ear pain.  RESPIRATORY: No cough, shortness of breath, wheezing or hemoptysis.  CARDIOVASCULAR: No chest pain, orthopnea, edema.  GASTROINTESTINAL: No nausea, vomiting, diarrhea or abdominal pain.  GENITOURINARY: No dysuria, hematuria.  ENDOCRINE: No polyuria, nocturia,  HEMATOLOGY: No anemia, easy bruising or bleeding SKIN: No rash or lesion. MUSCULOSKELETAL: No joint pain or arthritis.   NEUROLOGIC: No tingling, numbness, weakness.  PSYCHIATRY: No anxiety or depression.   DRUG ALLERGIES:  No Known Allergies  VITALS:  Blood pressure (!) 157/64, pulse 72, temperature 98.4 F (36.9 C), temperature source Oral, resp. rate 18, height 4\' 10"  (1.473 m), weight 45.3 kg, SpO2 98 %.  PHYSICAL EXAMINATION:  GENERAL:  85 y.o.-year-old patient lying in the bed with no acute distress.  EYES: Pupils equal, round, reactive to light and accommodation. No scleral icterus. Extraocular muscles intact.  HEENT: Head atraumatic, normocephalic. Oropharynx and nasopharynx clear.  NECK:  Supple, no jugular venous distention. No thyroid enlargement, no tenderness.  LUNGS: Normal breath sounds bilaterally, no wheezing, rales,rhonchi or crepitation. No use of accessory muscles of respiration.  CARDIOVASCULAR: S1, S2 normal. No murmurs, rubs, or gallops.  ABDOMEN: Soft, nontender, nondistended. Bowel sounds present. No organomegaly or mass.  EXTREMITIES: No pedal edema, cyanosis, or clubbing.  NEUROLOGIC: Cranial nerves II through XII are intact. Muscle strength 5/5 in all  extremities. Sensation intact. Gait not checked.  PSYCHIATRIC: The patient is alert and oriented x 3.  SKIN: No obvious rash, lesion, or ulcer.    LABORATORY PANEL:   CBC Recent Labs  Lab 05/16/18 0400 05/16/18 1328  WBC 4.4  --   HGB 10.6* 11.0*  HCT 32.6* 34.3*  PLT 144*  --    ------------------------------------------------------------------------------------------------------------------  Chemistries  Recent Labs  Lab 05/16/18 0400  NA 140  K 3.8  CL 109  CO2 23  GLUCOSE 97  BUN 10  CREATININE 0.39*  CALCIUM 7.6*  AST 33  ALT 20  ALKPHOS 75  BILITOT 0.3   ------------------------------------------------------------------------------------------------------------------  Cardiac Enzymes No results for input(s): TROPONINI in the last 168 hours. ------------------------------------------------------------------------------------------------------------------  RADIOLOGY:  Ct Abdomen Pelvis W Contrast  Result Date: 05/15/2018 CLINICAL DATA:  One episode of bloody emesis. Patient is on blood thinners. EXAM: CT ABDOMEN AND PELVIS WITH CONTRAST TECHNIQUE: Multidetector CT imaging of the abdomen and pelvis was performed using the standard protocol following bolus administration of intravenous contrast. CONTRAST:  40mL OMNIPAQUE IOHEXOL 300 MG/ML  SOLN COMPARISON:  MRI of the abdomen 06/24/2014 FINDINGS: Lower chest: Mild cardiomegaly without pericardial effusion. Coronary arteriosclerosis is noted. [place] off a geode varices in small hiatal hernia is identified. Hepatobiliary: Morphologic changes of cirrhosis with enlarged left hepatic lobe, recanalized umbilical vein but without intrahepatic mass or biliary dilatation. Cysts are noted of the right hepatic lobe. The patient is status post cholecystectomy. Pneumobilia likely from prior intervention is noted. Pancreas: No ductal dilatation, mass or inflammation. Spleen: The spleen measures 10.9 x 3.6 x 9.6 cm (volume = 200 cm^3).  No splenic mass. Adrenals/Urinary Tract: Normal bilateral adrenal glands. No renal mass  nor obstructive uropathy. No hydroureteronephrosis. The urinary bladder is unremarkable. Stomach/Bowel: Moderate-sized hiatal hernia. Physiologic distention of the stomach. The duodenal sweep and ligament of Treitz are normal. Scattered mild fluid distention of small bowel loops without mechanical obstruction. Moderate stool retention within the colon without inflammation. Scattered colonic diverticulosis without acute diverticulitis is identified. Normal-appearing appendix. Vascular/Lymphatic: Aortoiliac and branch vessel atherosclerosis. No aneurysm. Extensive paraesophageal and gastric varices. Recanalized umbilical vein. Reproductive: Hysterectomy. No adnexal mass. Cerclage device in place. Other: No free air nor ascites. Musculoskeletal: Left femoral nail fixation. Degenerative disc disease L4-5. IMPRESSION: 1. Morphologic changes of cirrhosis with stigmata of portal hypertension including paraesophageal and gastric varices and recannulized umbilical vein. No evidence of active hemorrhage. 2. Status post cholecystectomy. Pneumobilia likely from prior intervention. Stable right-sided hepatic cysts. 3. Aortoiliac and branch vessel atherosclerosis. 4. Colonic diverticulosis without acute diverticulitis. 5. Degenerative disc disease L4-5. Left femoral nail fixation. Electronically Signed   By: Ashley Royalty M.D.   On: 05/15/2018 23:34    EKG:   Orders placed or performed during the hospital encounter of 01/21/18  . EKG 12-Lead  . EKG 12-Lead  . EKG 12-Lead  . EKG 12-Lead  . EKG    ASSESSMENT AND PLAN:  Upper GI bleed with hematemesis in the context of portal hypertension, esophageal and gastric varices.  Patient is hemodynamically stable with stable hemoglobin this morning.  No hematemesis after the admission.  Hemoglobin is around 11 this morning.  Continue Protonix drip, octreotide drip, possible EGD tomorrow as  per gastroenterology note.  #2/ history of DVT, and on Eliquis, holding the Eliquis for a preparation for EGD. #3. history of cryptogenic cirrhosis, has family history of cirrhosis as per patient daughter.,  Start patient is on antibiotics for SBP prophylaxis.  All the records are reviewed and case discussed with Care Management/Social Workerr. Management plans discussed with the patient, family and they are in agreement.  CODE STATUS: DNR  TOTAL TIME TAKING CARE OF THIS PATIENT: 35 minutes.   POSSIBLE D/C IN 1-2 DAYS, DEPENDING ON CLINICAL CONDITION.   Epifanio Lesches M.D on 05/17/2018 at 10:30 AM  Between 7am to 6pm - Pager - [phone]  After 6pm go to [email or web address] - password [email or web address] [facility]  Office  [phone]  CC: Primary care physician; Jinny Sanders, MD   Note: This dictation was prepared with Dragon dictation along with smaller phrase technology. Any transcriptional errors that result from this process are unintentional.

## 2018-05-17 NOTE — Progress Notes (Signed)
Brianna Darby, MD [address]  [place], [place] [address]  Main: [phone]  Fax: [phone] Pager: [phone]   Subjective: No further episodes of hematemesis, patient's daughter is bedside.  She has been tolerating clear liquid diet well.  Hemoglobin is stable.  She denies any complaints.  Waiting for EGD   Objective: Vital signs in last 24 hours: Vitals:   05/16/18 2033 05/17/18 0844 05/17/18 1119 05/17/18 1542  BP:  (!) 157/64 (!) 159/77 (!) 158/77  Pulse:  72 70 71  Resp:  18  18  Temp:  98.4 F (36.9 C) 97.6 F (36.4 C) 99 F (37.2 C)  TempSrc:  Oral Oral Oral  SpO2:  98% 96% 97%  Weight: 45.3 kg     Height:       Weight change: -3.235 kg  Intake/Output Summary (Last 24 hours) at 05/17/2018 1924 Last data filed at 05/17/2018 1854 Gross per 24 hour  Intake 5278.97 ml  Output 400 ml  Net 4878.97 ml     Exam: Heart:: Regular rate and rhythm or S1S2 present Lungs: normal and clear to auscultation Abdomen: soft, nontender, normal bowel sounds   Lab Results: CBC Latest Ref Rng & Units 05/16/2018 05/16/2018 05/15/2018  WBC 4.0 - 10.5 K/uL - 4.4 5.5  Hemoglobin 12.0 - 15.0 g/dL 11.0(L) 10.6(L) 11.6(L)  Hematocrit 36.0 - 46.0 % 34.3(L) 32.6(L) 35.4(L)  Platelets 150 - 400 K/uL - 144(L) 169   BMP Latest Ref Rng & Units 05/16/2018 05/15/2018 01/24/2018  Glucose 70 - 99 mg/dL 97 98 116(H)  BUN 8 - 23 mg/dL _0 Creatinine 0.44 - 1.00 mg/dL 0.39(L) 0.52 0.85  Sodium 135 - 145 mmol/L 140 140 137  Potassium 3.5 - 5.1 mmol/L 3.8 3.5 3.7  Chloride 98 - 111 mmol/L 109 107 99  CO2 22 - 32 mmol/L 23 26 33(H)  Calcium 8.9 - 10.3 mg/dL 7.6(L) 8.3(L) 8.0(L)   Hepatic Function Latest Ref Rng & Units 05/16/2018 05/15/2018 01/22/2018  Total Protein 6.5 - 8.1 g/dL 5.5(L) 6.3(L) -  Albumin 3.5 - 5.0 g/dL 3.3(L) 4.0 2.9(L)  AST 15 - 41 U/L 33 39 -  ALT 0 - 44 U/L 20 24 -  Alk Phosphatase 38 - 126 U/L 75 88 -  Total Bilirubin 0.3 - 1.2 mg/dL 0.3 0.8 -    Bilirubin, Direct 0.0 - 0.2 mg/dL - - -   Micro Results: No results found for this or any previous visit (from the past 240 hour(s)). Studies/Results: Ct Abdomen Pelvis W Contrast  Result Date: 05/15/2018 CLINICAL DATA:  One episode of bloody emesis. Patient is on blood thinners. EXAM: CT ABDOMEN AND PELVIS WITH CONTRAST TECHNIQUE: Multidetector CT imaging of the abdomen and pelvis was performed using the standard protocol following bolus administration of intravenous contrast. CONTRAST:  76m OMNIPAQUE IOHEXOL 300 MG/ML  SOLN COMPARISON:  MRI of the abdomen 06/24/2014 FINDINGS: Lower chest: Mild cardiomegaly without pericardial effusion. Coronary arteriosclerosis is noted. [place] off a geode varices in small hiatal hernia is identified. Hepatobiliary: Morphologic changes of cirrhosis with enlarged left hepatic lobe, recanalized umbilical vein but without intrahepatic mass or biliary dilatation. Cysts are noted of the right hepatic lobe. The patient is status post cholecystectomy. Pneumobilia likely from prior intervention is noted. Pancreas: No ductal dilatation, mass or inflammation. Spleen: The spleen measures 10.9 x 3.6 x 9.6 cm (volume = 200 cm^3). No splenic mass. Adrenals/Urinary Tract: Normal bilateral adrenal glands. No renal mass nor obstructive uropathy.  No hydroureteronephrosis. The urinary bladder is unremarkable. Stomach/Bowel: Moderate-sized hiatal hernia. Physiologic distention of the stomach. The duodenal sweep and ligament of Treitz are normal. Scattered mild fluid distention of small bowel loops without mechanical obstruction. Moderate stool retention within the colon without inflammation. Scattered colonic diverticulosis without acute diverticulitis is identified. Normal-appearing appendix. Vascular/Lymphatic: Aortoiliac and branch vessel atherosclerosis. No aneurysm. Extensive paraesophageal and gastric varices. Recanalized umbilical vein. Reproductive: Hysterectomy. No adnexal mass.  Cerclage device in place. Other: No free air nor ascites. Musculoskeletal: Left femoral nail fixation. Degenerative disc disease L4-5. IMPRESSION: 1. Morphologic changes of cirrhosis with stigmata of portal hypertension including paraesophageal and gastric varices and recannulized umbilical vein. No evidence of active hemorrhage. 2. Status post cholecystectomy. Pneumobilia likely from prior intervention. Stable right-sided hepatic cysts. 3. Aortoiliac and branch vessel atherosclerosis. 4. Colonic diverticulosis without acute diverticulitis. 5. Degenerative disc disease L4-5. Left femoral nail fixation. Electronically Signed   By: Ashley Royalty M.D.   On: 05/15/2018 23:34   Medications:  I have reviewed the patient's current medications. Prior to Admission:  Medications Prior to Admission  Medication Sig Dispense Refill Last Dose  . alendronate (FOSAMAX) 70 MG tablet TAKE 1 TABLET BY MOUTH ONCE A WEEK, AS DIRECTED 12 tablet 0 Past Week at Unknown time  . apixaban (ELIQUIS) 5 MG TABS tablet Take 1 tablet (5 mg total) by mouth 2 (two) times daily. 60 tablet 4 05/15/2018 at 0800  . Calcium-Phosphorus-Vitamin D (CALCIUM GUMMIES PO) Take 1 tablet by mouth daily.   05/15/2018 at 0800  . diclofenac sodium (VOLTAREN) 1 % GEL Apply 4 g topically 4 (four) times daily. 100 g 11 05/15/2018 at Unknown time  . dimenhyDRINATE (DRAMAMINE) 50 MG tablet Take 50 mg by mouth every 8 (eight) hours as needed for dizziness.   prn at prn  . ibuprofen (ADVIL,MOTRIN) 200 MG tablet Take 400 mg by mouth daily as needed for headache or mild pain.    prn at prn  . Iron Combinations (IRON COMPLEX PO) Take 1 tablet by mouth daily.   05/15/2018 at 0800  . lidocaine (LIDODERM) 5 % Place 1 patch onto the skin daily. Remove & Discard patch within 12 hours or as directed by MD 30 patch 0 05/15/2018 at Unknown time  . pantoprazole (PROTONIX) 40 MG tablet Take 1 tablet (40 mg total) by mouth daily before breakfast. 90 tablet 1 05/15/2018 at 0800  .  sennosides-docusate sodium (SENOKOT-S) 8.6-50 MG tablet Take 1 tablet by mouth daily as needed for constipation.   prn at prn  . timolol (TIMOPTIC) 0.5 % ophthalmic solution Place 1 drop into both eyes daily.    05/15/2018 at 0800  . traMADol (ULTRAM) 50 MG tablet TAKE ONE-HALF TO ONE TABLET BY MOUTH UP TO EVERY 12 HOURS AS NEEDED FOR MODERATE PAIN. 30 tablet 0 prn at prn  . traZODone (DESYREL) 150 MG tablet Take 1 tablet (150 mg total) by mouth at bedtime. 30 tablet 1 05/14/2018 at 2000  . vitamin E 400 UNIT capsule Take 400 Units by mouth daily.   05/15/2018 at 0800  . simethicone (MYLICON) 809 MG chewable tablet Chew 125 mg by mouth every 6 (six) hours as needed for flatulence.   prn at prn   Scheduled: . senna-docusate  1 tablet Oral Daily  . sodium chloride flush  3 mL Intravenous Q12H  . timolol  1 drop Both Eyes Daily  . traZODone  150 mg Oral QHS   Continuous: . ciprofloxacin 400 mg (05/17/18 1225)  .  lactated ringers 50 mL/hr at 05/17/18 1854  . octreotide  (SANDOSTATIN)    IV infusion 50 mcg/hr (05/17/18 1854)  . pantoprozole (PROTONIX) infusion 8 mg/hr (05/17/18 1854)   UYQ:[id number] **OR** ondansetron (ZOFRAN) IV, traMADol Anti-infectives (From admission, onward)   Start     Dose/Rate Route Frequency Ordered Stop   05/17/18 1100  ciprofloxacin (CIPRO) IVPB 400 mg     400 mg 200 mL/hr over 60 Minutes Intravenous Every 12 hours 05/17/18 1033     05/16/18 0200  cefTRIAXone (ROCEPHIN) 1 g in sodium chloride 0.9 % 100 mL IVPB     1 g 200 mL/hr over 30 Minutes Intravenous  Once 05/16/18 0146 05/16/18 0310     Scheduled Meds: . senna-docusate  1 tablet Oral Daily  . sodium chloride flush  3 mL Intravenous Q12H  . timolol  1 drop Both Eyes Daily  . traZODone  150 mg Oral QHS   Continuous Infusions: . ciprofloxacin 400 mg (05/17/18 1225)  . lactated ringers 50 mL/hr at 05/17/18 1854  . octreotide  (SANDOSTATIN)    IV infusion 50 mcg/hr (05/17/18 1854)  . pantoprozole (PROTONIX)  infusion 8 mg/hr (05/17/18 1854)   PRN Meds:.ondansetron **OR** ondansetron (ZOFRAN) IV, traMADol   Assessment: Active Problems:   GI bleed  Cryptogenic cirrhosis of liver single episode of hematemesis, no further episodes since admission  Plan: Continue octreotide and pantoprazole drips Ciprofloxacin for SBP prophylaxis Clear liquid diet N.p.o. past midnight EGD tomorrow for further evaluation of hematemesis  Will follow along with you   LOS: 1 day   Nadina Fomby 05/17/2018, 7:24 PM

## 2018-05-18 ENCOUNTER — Encounter: Payer: Self-pay | Admitting: Anesthesiology

## 2018-05-18 ENCOUNTER — Encounter
Admission: EM | Disposition: A | Discharge status: Home Health | Payer: Self-pay | Source: Home / Self Care | Attending: Internal Medicine

## 2018-05-18 ENCOUNTER — Inpatient Hospital Stay: Payer: Medicare Other | Admitting: Anesthesiology

## 2018-05-18 ENCOUNTER — Inpatient Hospital Stay: Payer: Medicare Other

## 2018-05-18 DIAGNOSIS — K31819 Angiodysplasia of stomach and duodenum without bleeding: Secondary | ICD-10-CM

## 2018-05-18 HISTORY — PX: ESOPHAGOGASTRODUODENOSCOPY: SHX5428

## 2018-05-18 LAB — CBC
HCT: 34.6 % — ABNORMAL LOW (ref 36.0–46.0)
Hemoglobin: 11.1 g/dL — ABNORMAL LOW (ref 12.0–15.0)
MCH: 29.4 pg (ref 26.0–34.0)
MCHC: 32.1 g/dL (ref 30.0–36.0)
MCV: 91.8 fL (ref 80.0–100.0)
Platelets: 143 10*3/uL — ABNORMAL LOW (ref 150–400)
RBC: 3.77 MIL/uL — ABNORMAL LOW (ref 3.87–5.11)
RDW: 13.8 % (ref 11.5–15.5)
WBC: 3.3 10*3/uL — ABNORMAL LOW (ref 4.0–10.5)
nRBC: 0 % (ref 0.0–0.2)

## 2018-05-18 LAB — GLUCOSE, CAPILLARY: Glucose-Capillary: 90 mg/dL (ref 70–99)

## 2018-05-18 LAB — IRON AND TIBC
Iron: 42 ug/dL (ref 28–170)
Saturation Ratios: 14 % (ref 10.4–31.8)
TIBC: 301 ug/dL (ref 250–450)
UIBC: 259 ug/dL

## 2018-05-18 LAB — FOLATE: Folate: 10.2 ng/mL (ref >5.9)

## 2018-05-18 LAB — FERRITIN: Ferritin: 19 ng/mL (ref 11–307)

## 2018-05-18 SURGERY — EGD (ESOPHAGOGASTRODUODENOSCOPY)
Anesthesia: General

## 2018-05-18 MED ORDER — CYANOCOBALAMIN 1000 MCG/ML IJ SOLN
1000.0000 ug | Freq: Once | INTRAMUSCULAR | Status: AC
  Administered 2018-05-19: 1000 ug via SUBCUTANEOUS
  Filled 2018-05-18: qty 1

## 2018-05-18 MED ORDER — PROPOFOL 10 MG/ML IV BOLUS
INTRAVENOUS | Status: DC | PRN
  Administered 2018-05-18: 40 mg via INTRAVENOUS

## 2018-05-18 MED ORDER — PROPOFOL 500 MG/50ML IV EMUL
INTRAVENOUS | Status: DC | PRN
  Administered 2018-05-18: 50 ug/kg/min via INTRAVENOUS

## 2018-05-18 MED ORDER — FENTANYL CITRATE (PF) 100 MCG/2ML IJ SOLN
INTRAMUSCULAR | Status: DC | PRN
  Administered 2018-05-18: 50 ug via INTRAVENOUS

## 2018-05-18 MED ORDER — WARFARIN SODIUM 2.5 MG PO TABS
2.5000 mg | ORAL_TABLET | Freq: Every day | ORAL | Status: AC
  Administered 2018-05-18 – 2018-05-19 (×2): 2.5 mg via ORAL
  Filled 2018-05-18 (×2): qty 1

## 2018-05-18 MED ORDER — SODIUM CHLORIDE 0.9 % IV SOLN
INTRAVENOUS | Status: DC | PRN
  Administered 2018-05-18: 13:00:00 via INTRAVENOUS

## 2018-05-18 MED ORDER — SODIUM CHLORIDE 0.9 % IV SOLN
510.0000 mg | Freq: Once | INTRAVENOUS | Status: AC
  Administered 2018-05-19: 510 mg via INTRAVENOUS
  Filled 2018-05-18: qty 17

## 2018-05-18 MED ORDER — WARFARIN - PHARMACIST DOSING INPATIENT
Freq: Every day | Status: DC
  Administered 2018-05-18 – 2018-05-19 (×2)

## 2018-05-18 MED ORDER — PROPOFOL 10 MG/ML IV BOLUS
INTRAVENOUS | Status: AC
  Filled 2018-05-18: qty 20

## 2018-05-18 MED ORDER — FENTANYL CITRATE (PF) 100 MCG/2ML IJ SOLN
INTRAMUSCULAR | Status: AC
  Filled 2018-05-18: qty 2

## 2018-05-18 MED ORDER — LIDOCAINE HCL (PF) 1 % IJ SOLN
INTRAMUSCULAR | Status: AC
  Filled 2018-05-18: qty 2

## 2018-05-18 NOTE — Anesthesia Postprocedure Evaluation (Signed)
Anesthesia Post Note  Patient: Lilo Wallington  Procedure(s) Performed: ESOPHAGOGASTRODUODENOSCOPY (EGD) (N/A )  Patient location during evaluation: Endoscopy Anesthesia Type: General Level of consciousness: awake and alert Pain management: pain level controlled Vital Signs Assessment: post-procedure vital signs reviewed and stable Respiratory status: spontaneous breathing, nonlabored ventilation, respiratory function stable and patient connected to nasal cannula oxygen Cardiovascular status: blood pressure returned to baseline and stable Postop Assessment: no apparent nausea or vomiting Anesthetic complications: no     Last Vitals:  Vitals:   05/18/18 1349 05/18/18 1359  BP: (!) 130/55   Pulse: (!) 54 63  Resp: 18 (!) 21  Temp:    SpO2: 96% 93%    Last Pain:  Vitals:   05/18/18 1339  TempSrc: Tympanic  PainSc: 0-No pain                 Martha Clan

## 2018-05-18 NOTE — Transfer of Care (Signed)
Immediate Anesthesia Transfer of Care Note  Patient: Brianna Barnes  Procedure(s) Performed: ESOPHAGOGASTRODUODENOSCOPY (EGD) (N/A )  Patient Location: Endoscopy Unit  Anesthesia Type:General  Level of Consciousness: sedated  Airway & Oxygen Therapy: Patient Spontanous Breathing and Patient connected to nasal cannula oxygen  Post-op Assessment: Report given to RN and Post -op Vital signs reviewed and stable  Post vital signs: Reviewed  Last Vitals:  Vitals Value Taken Time  BP 122/51 05/18/2018  1:40 PM  Temp 36.1 C 05/18/2018  1:39 PM  Pulse 55 05/18/2018  1:41 PM  Resp 11 05/18/2018  1:41 PM  SpO2 94 % 05/18/2018  1:41 PM  Vitals shown include unvalidated device data.  Last Pain:  Vitals:   05/18/18 1339  TempSrc: Tympanic  PainSc:          Complications: No apparent anesthesia complications

## 2018-05-18 NOTE — Progress Notes (Signed)
EGD revealed several non-bleeding AVMs in duodenum, cauterized with APC. No evidence of esophageal or gastric varices.  Dopplers today showed partially occlusive chronic DVT in the right popliteal vein. Eliquis was originally started for rt LE DVT after a hip fracture in 01/2018. Discontinued during this admission due to GI bleed  Pt is at risk for rebleeding on anticoagulation. She may have more small bowel AVMs. Due to presence of persistent LE DVT, I discussed with pt's daughter who is HPOA about various options including IVC filter or eliquis or coumadin. She opted for low dose coumadin instead of eliquis as it can be monitored and reversed if needed. Pt has only child A cirrhosis.   She should follow up with vascular or orthopedic to determine the duration of anticoagulation  GI will sign off at this time  Cephas Darby, MD [facility]  [place], [place] [address]  Main: [phone]  Fax: [phone] Pager: [phone]

## 2018-05-18 NOTE — Progress Notes (Signed)
Back from endo, no active bleeding, denies need, CB in reach, family at bedside

## 2018-05-18 NOTE — Op Note (Signed)
[facility] Gastroenterology Patient Name: Brianna Barnes Procedure Date: 05/18/2018 1:09 PM MRN: [id number] Account #: [id number] Date of Birth: 10-23-1928 Admit Type: Inpatient Age: 85 Room: [facility] ENDO ROOM 2 Gender: Female Note Status: Finalized Procedure:            Upper GI endoscopy Indications:          Hematemesis, , h/o cirrhosis Providers:            Lin Landsman MD, MD Referring MD:         Jinny Sanders MD, MD (Referring MD) Medicines:            Monitored Anesthesia Care Complications:        No immediate complications. Estimated blood loss: None. Procedure:            Pre-Anesthesia Assessment:                       - Prior to the procedure, a History and Physical was                        performed, and patient medications and allergies were                        reviewed. The patient is competent. The risks and                        benefits of the procedure and the sedation options and                        risks were discussed with the patient. All questions                        were answered and informed consent was obtained.                        Patient identification and proposed procedure were                        verified by the physician, the nurse, the                        anesthesiologist, the anesthetist and the technician in                        the pre-procedure area in the procedure room in the                        endoscopy suite. Mental Status Examination: alert and                        oriented. Airway Examination: normal oropharyngeal                        airway and neck mobility. Respiratory Examination:                        clear to auscultation. CV Examination: normal.                        Prophylactic Antibiotics: The patient does not require  prophylactic antibiotics. Prior Anticoagulants: The                        patient has taken Eliquis (apixaban), last dose was 3                  days prior to procedure. ASA Grade Assessment: III - A                        patient with severe systemic disease. After reviewing                        the risks and benefits, the patient was deemed in                        satisfactory condition to undergo the procedure. The                        anesthesia plan was to use monitored anesthesia care                        (MAC). Immediately prior to administration of                        medications, the patient was re-assessed for adequacy                        to receive sedatives. The heart rate, respiratory rate,                        oxygen saturations, blood pressure, adequacy of                        pulmonary ventilation, and response to care were                        monitored throughout the procedure. The physical status                        of the patient was re-assessed after the procedure.                       After obtaining informed consent, the endoscope was                        passed under direct vision. Throughout the procedure,                        the patient's blood pressure, pulse, and oxygen                        saturations were monitored continuously. The Endoscope                        was introduced through the mouth, and advanced to the                        second part of duodenum. The upper GI endoscopy was  accomplished without difficulty. The patient tolerated                        the procedure well. Findings:      Four 3 to 6 mm angiodysplastic lesions without bleeding were found in       the duodenal bulb and in the second portion of the duodenum. Coagulation       for hemostasis using argon plasma was successful.      The entire examined stomach was normal.      The cardia and gastric fundus were normal on retroflexion.      The gastroesophageal junction and examined esophagus were normal.      A small hiatal hernia was present. Impression:            - Four non-bleeding angiodysplastic lesions in the                        duodenum. Treated with argon plasma coagulation (APC).                       - Normal stomach.                       - Normal gastroesophageal junction and esophagus.                       - Small hiatal hernia.                       - No specimens collected. Recommendation:       - Return patient to hospital ward for possible                        discharge same day.                       - Cardiac diet today.                       - Discontinue protonix and octreotide drips                       - At risk for rebleeding from anticoagulation, may have                        more AVMs in rest of the small bowel, defer to her                        cardiologist to assess benefits over risks                       - Follow up with her GI, Dr Hilarie Fredrickson after discharge Procedure Code(s):    --- Professional ---                       [phone], Esophagogastroduodenoscopy, flexible, transoral;                        with control of bleeding, any method Diagnosis Code(s):    --- Professional ---                       T61.443, Angiodysplasia  of stomach and duodenum without                        bleeding                       K44.9, Diaphragmatic hernia without obstruction or                        gangrene                       K92.0, Hematemesis CPT copyright 2018 [employer]. All rights reserved. The codes documented in this report are preliminary and upon coder review may  be revised to meet current compliance requirements. Dr. Ulyess Mort Lin Landsman MD, MD 05/18/2018 1:39:02 PM This report has been signed electronically. Number of Addenda: 0 Note Initiated On: 05/18/2018 1:09 PM      [facility]

## 2018-05-18 NOTE — Anesthesia Preprocedure Evaluation (Signed)
Anesthesia Evaluation  Patient identified by MRN, date of birth, ID band Patient awake    Reviewed: Allergy & Precautions, H&P , NPO status , Patient's Chart, lab work & pertinent test results, reviewed documented beta blocker date and time   History of Anesthesia Complications Negative for: history of anesthetic complications  Airway Mallampati: I  TM Distance: >3 FB Neck ROM: full    Dental  (+) Upper Dentures, Lower Dentures, Edentulous Upper, Edentulous Lower, Dental Advidsory Given   Pulmonary neg pulmonary ROS,           Cardiovascular Exercise Tolerance: Good negative cardio ROS       Neuro/Psych PSYCHIATRIC DISORDERS Anxiety negative neurological ROS     GI/Hepatic PUD, GERD  ,(+) Cirrhosis       , Hepatitis -, C  Endo/Other  negative endocrine ROS  Renal/GU negative Renal ROS  negative genitourinary   Musculoskeletal   Abdominal   Peds  Hematology  (+) Blood dyscrasia, anemia ,   Anesthesia Other Findings Past Medical History: No date: Choledocholithiasis No date: Cirrhosis, cryptogenic (HCC)     Comment:  FOLLOWED BY DR PYRTLE-- LOV  IN EPIC--  STABLE PER NOTE No date: Cryptogenic cirrhosis (HCC) No date: Esophageal varix (HCC) No date: GERD (gastroesophageal reflux disease) No date: History of colonoscopy with polypectomy     Comment:  ADENOMATOUS AND HYPERPLASIA POLYPS  (2008  &  2010) No date: History of hepatitis C No date: History of positive PPD     Comment:  + TB SKIN TEST WITHOUT TB No date: Hyperlipidemia No date: Hypertension, portal (HCC)     Comment:  SECONDARY TO CRYPTOGENIC CIRRHOSIS No date: Internal hemorrhoids No date: Iron deficiency anemia No date: Loss of hearing     Comment:  right ear No date: Osteoarthritis     Comment:  hands, knees, and low back No date: Osteoporosis No date: PUD (peptic ulcer disease) No date: Tubular adenoma of colon No date: Uterovaginal  prolapse, incomplete No date: Wears glasses No date: Wears hearing aid     Comment:  left ear only   Reproductive/Obstetrics negative OB ROS                             Anesthesia Physical Anesthesia Plan  ASA: III  Anesthesia Plan: General   Post-op Pain Management:    Induction: Intravenous  PONV Risk Score and Plan: 3 and Propofol infusion and TIVA  Airway Management Planned: Natural Airway and Nasal Cannula  Additional Equipment:   Intra-op Plan:   Post-operative Plan:   Informed Consent: I have reviewed the patients History and Physical, chart, labs and discussed the procedure including the risks, benefits and alternatives for the proposed anesthesia with the patient or authorized representative who has indicated his/her understanding and acceptance.     Dental Advisory Given  Plan Discussed with: Anesthesiologist, CRNA and Surgeon  Anesthesia Plan Comments:         Anesthesia Quick Evaluation

## 2018-05-18 NOTE — Progress Notes (Addendum)
ANTICOAGULATION CONSULT NOTE - Initial Consult  Pharmacy Consult for Warfarin  Indication: history of DVT   No Known Allergies  Patient Measurements: Height: 4\' 10"  (147.3 cm) Weight: 105 lb 14.4 oz (48 kg) IBW/kg (Calculated) : 40.9 Heparin Dosing Weight:   Vital Signs: Temp: 97 F (36.1 C) (03/05 1339) Temp Source: Tympanic (03/05 1339) BP: 147/71 (03/05 1443) Pulse Rate: 58 (03/05 1443)  Labs: Recent Labs    05/15/18 2120 05/15/18 2203 05/16/18 0400 05/16/18 0503 05/16/18 1328 05/18/18 1043  HGB 11.6*  --  10.6*  --  11.0* 11.1*  HCT 35.4*  --  32.6*  --  34.3* 34.6*  PLT 169  --  144*  --   --  143*  APTT  --  31  --  24  --   --   LABPROT  --  13.7  --  13.6  --   --   INR  --  1.1  --  1.1  --   --   CREATININE 0.52  --  0.39*  --   --   --     Estimated Creatinine Clearance: 32 mL/min (A) (by C-G formula based on SCr of 0.39 mg/dL (L)).   Medical History: Past Medical History:  Diagnosis Date  . Choledocholithiasis   . Cirrhosis, cryptogenic (HCC)    FOLLOWED BY DR PYRTLE-- LOV  IN EPIC--  STABLE PER NOTE  . Cryptogenic cirrhosis ([place])   . Esophageal varix ([place])   . GERD (gastroesophageal reflux disease)   . History of colonoscopy with polypectomy    ADENOMATOUS AND HYPERPLASIA POLYPS  (2008  &  2010)  . History of hepatitis C   . History of positive PPD    + TB SKIN TEST WITHOUT TB  . Hyperlipidemia   . Hypertension, portal ([place])    SECONDARY TO CRYPTOGENIC CIRRHOSIS  . Internal hemorrhoids   . Iron deficiency anemia   . Loss of hearing    right ear  . Osteoarthritis    hands, knees, and low back  . Osteoporosis   . PUD (peptic ulcer disease)   . Tubular adenoma of colon   . Uterovaginal prolapse, incomplete   . Wears glasses   . Wears hearing aid    left ear only    Medications:  Medications Prior to Admission  Medication Sig Dispense Refill Last Dose  . alendronate (FOSAMAX) 70 MG tablet TAKE 1 TABLET BY MOUTH ONCE A WEEK, AS  DIRECTED 12 tablet 0 Past Week at Unknown time  . apixaban (ELIQUIS) 5 MG TABS tablet Take 1 tablet (5 mg total) by mouth 2 (two) times daily. 60 tablet 4 05/15/2018 at 0800  . Calcium-Phosphorus-Vitamin D (CALCIUM GUMMIES PO) Take 1 tablet by mouth daily.   05/15/2018 at 0800  . diclofenac sodium (VOLTAREN) 1 % GEL Apply 4 g topically 4 (four) times daily. 100 g 11 05/15/2018 at Unknown time  . dimenhyDRINATE (DRAMAMINE) 50 MG tablet Take 50 mg by mouth every 8 (eight) hours as needed for dizziness.   prn at prn  . ibuprofen (ADVIL,MOTRIN) 200 MG tablet Take 400 mg by mouth daily as needed for headache or mild pain.    prn at prn  . Iron Combinations (IRON COMPLEX PO) Take 1 tablet by mouth daily.   05/15/2018 at 0800  . lidocaine (LIDODERM) 5 % Place 1 patch onto the skin daily. Remove & Discard patch within 12 hours or as directed by MD 30 patch 0 05/15/2018 at Unknown  time  . pantoprazole (PROTONIX) 40 MG tablet Take 1 tablet (40 mg total) by mouth daily before breakfast. 90 tablet 1 05/15/2018 at 0800  . sennosides-docusate sodium (SENOKOT-S) 8.6-50 MG tablet Take 1 tablet by mouth daily as needed for constipation.   prn at prn  . timolol (TIMOPTIC) 0.5 % ophthalmic solution Place 1 drop into both eyes daily.    05/15/2018 at 0800  . traMADol (ULTRAM) 50 MG tablet TAKE ONE-HALF TO ONE TABLET BY MOUTH UP TO EVERY 12 HOURS AS NEEDED FOR MODERATE PAIN. 30 tablet 0 prn at prn  . traZODone (DESYREL) 150 MG tablet Take 1 tablet (150 mg total) by mouth at bedtime. 30 tablet 1 05/14/2018 at 2000  . vitamin E 400 UNIT capsule Take 400 Units by mouth daily.   05/15/2018 at 0800  . simethicone (MYLICON) 222 MG chewable tablet Chew 125 mg by mouth every 6 (six) hours as needed for flatulence.   prn at prn    Assessment: Pt developed DVT in 02/2018 secondary to surgical repair of hip fracture.  Was treated outpt with Eliquis 5 mg PO BID since 03/09/2018 but developed significant GI bleed. GI lesions are currently no longer  bleeding.  MD wants to rechallenge her with anticoagulation using warfarin to see if her lesions resume bleeding.   MD suggested giving warfarin 2.5 mg PO X 2 .   3/5:  INR = 1.1 ,  Hct = 34.6,  Hgb = 11   Goal of Therapy:  Pt can safely tolerate anticoag with warfarin.     Plan:  Will order warfarin 2.5 mg PO Q-1800 X 2 doses, per MD request.  Will check INR and CBC on 3/6 with AM labs.   Brianna Barnes D 05/18/2018,6:38 PM

## 2018-05-18 NOTE — Plan of Care (Signed)
  Problem: Education: Goal: Knowledge of General Education information will improve Description Including pain rating scale, medication(s)/side effects and non-pharmacologic comfort measures Outcome: Progressing   Problem: Health Behavior/Discharge Planning: Goal: Ability to manage health-related needs will improve Outcome: Progressing   Problem: Clinical Measurements: Goal: Ability to maintain clinical measurements within normal limits will improve Outcome: Progressing Goal: Will remain free from infection Outcome: Progressing Note:  Remains afebrile Goal: Diagnostic test results will improve Outcome: Progressing Note:  Hemoglobin remains stable at 11 Goal: Respiratory complications will improve Outcome: Progressing Goal: Cardiovascular complication will be avoided Outcome: Progressing Note:  No arrhythmias over night

## 2018-05-18 NOTE — Anesthesia Post-op Follow-up Note (Signed)
Anesthesia QCDR form completed.        

## 2018-05-18 NOTE — Progress Notes (Signed)
To endoscopy via bed

## 2018-05-18 NOTE — Progress Notes (Addendum)
[place] at [place] NAME: Brianna Barnes    MR#:  [id number]  DATE OF BIRTH:  Jan 09, 1929  SUBJECTIVE: No further hematemesis, scheduled for EGD this afternoon.  CHIEF COMPLAINT:   Chief Complaint  Patient presents with  . Hematemesis    REVIEW OF SYSTEMS:   ROS CONSTITUTIONAL: No fever, fatigue or weakness.  EYES: No blurred or double vision.  EARS, NOSE, AND THROAT: No tinnitus or ear pain.  RESPIRATORY: No cough, shortness of breath, wheezing or hemoptysis.  CARDIOVASCULAR: No chest pain, orthopnea, edema.  GASTROINTESTINAL: No nausea, vomiting, diarrhea or abdominal pain.  GENITOURINARY: No dysuria, hematuria.  ENDOCRINE: No polyuria, nocturia,  HEMATOLOGY: No anemia, easy bruising or bleeding SKIN: No rash or lesion. MUSCULOSKELETAL: No joint pain or arthritis.   NEUROLOGIC: No tingling, numbness, weakness.  PSYCHIATRY: No anxiety or depression.   DRUG ALLERGIES:  No Known Allergies  VITALS:  Blood pressure (!) 138/52, pulse 68, temperature 98.4 F (36.9 C), temperature source Oral, resp. rate 15, height 4\' 10"  (1.473 m), weight 48 kg, SpO2 94 %.  PHYSICAL EXAMINATION:  GENERAL:  85 y.o.-year-old patient lying in the bed with no acute distress.  EYES: Pupils equal, round, reactive to light and accommodation. No scleral icterus. Extraocular muscles intact.  HEENT: Head atraumatic, normocephalic. Oropharynx and nasopharynx clear.  NECK:  Supple, no jugular venous distention. No thyroid enlargement, no tenderness.  LUNGS: Normal breath sounds bilaterally, no wheezing, rales,rhonchi or crepitation. No use of accessory muscles of respiration.  CARDIOVASCULAR: S1, S2 normal. No murmurs, rubs, or gallops.  ABDOMEN: Soft, nontender, nondistended. Bowel sounds present. No organomegaly or mass.  EXTREMITIES: No pedal edema, cyanosis, or clubbing.  NEUROLOGIC: Cranial nerves II through XII are intact. Muscle strength 5/5 in all  extremities. Sensation intact. Gait not checked.  PSYCHIATRIC: The patient is alert and oriented x 3.  SKIN: No obvious rash, lesion, or ulcer.    LABORATORY PANEL:   CBC Recent Labs  Lab 05/18/18 1043  WBC 3.3*  HGB 11.1*  HCT 34.6*  PLT 143*   ------------------------------------------------------------------------------------------------------------------  Chemistries  Recent Labs  Lab 05/16/18 0400  NA 140  K 3.8  CL 109  CO2 23  GLUCOSE 97  BUN 10  CREATININE 0.39*  CALCIUM 7.6*  AST 33  ALT 20  ALKPHOS 75  BILITOT 0.3   ------------------------------------------------------------------------------------------------------------------  Cardiac Enzymes No results for input(s): TROPONINI in the last 168 hours. ------------------------------------------------------------------------------------------------------------------  RADIOLOGY:  No results found.  EKG:   Orders placed or performed during the hospital encounter of 01/21/18  . EKG 12-Lead  . EKG 12-Lead  . EKG 12-Lead  . EKG 12-Lead  . EKG    ASSESSMENT AND PLAN:  Upper GI bleed with hematemesis in the context of portal hypertension, esophageal and gastric varices.  Patient is hemodynamically stable with stable hemoglobin this morning.  No hematemesis after the admission.  Hemoglobin is stable, for EGD this afternoon , onon Protonix, octreotide drip.   #2/ history of DVT, and on Eliquis, holding the Eliquis for a preparation for EGD.,  Will order ultrasound of the legs to evaluate for DVT.  Patient developed DVT after hip fracture, will check ultrasound of the legs to evaluate for DVT if negative for DVT will discontinue Eliquis discussed with gastroenterologist. #3. history of cryptogenic cirrhosis, has family history of cirrhosis as per patient daughter.,  Start patient is on antibiotics for SBP prophylaxis. Spoke with multiple patient family members in the room.  All the records are reviewed  and case discussed with Care Management/Social Workerr. Management plans discussed with the patient, family and they are in agreement.  CODE STATUS: DNR  TOTAL TIME TAKING CARE OF THIS PATIENT: 35 minutes.   POSSIBLE D/C IN 1-2 DAYS, DEPENDING ON CLINICAL CONDITION.   Epifanio Lesches M.D on 05/18/2018 at 12:44 PM  Between 7am to 6pm - Pager - [phone]  After 6pm go to [email or web address] - password [email or web address] [facility]  Office  [phone]  CC: Primary care physician; Jinny Sanders, MD   Note: This dictation was prepared with Dragon dictation along with smaller phrase technology. Any transcriptional errors that result from this process are unintentional.

## 2018-05-19 LAB — CBC
HCT: 35.8 % — ABNORMAL LOW (ref 36.0–46.0)
Hemoglobin: 11.6 g/dL — ABNORMAL LOW (ref 12.0–15.0)
MCH: 29.7 pg (ref 26.0–34.0)
MCHC: 32.4 g/dL (ref 30.0–36.0)
MCV: 91.6 fL (ref 80.0–100.0)
NRBC: 0 % (ref 0.0–0.2)
Platelets: 145 10*3/uL — ABNORMAL LOW (ref 150–400)
RBC: 3.91 MIL/uL (ref 3.87–5.11)
RDW: 13.8 % (ref 11.5–15.5)
WBC: 3.9 10*3/uL — ABNORMAL LOW (ref 4.0–10.5)

## 2018-05-19 LAB — GLUCOSE, CAPILLARY: GLUCOSE-CAPILLARY: 90 mg/dL (ref 70–99)

## 2018-05-19 LAB — PROTIME-INR
INR: 1.2 (ref 0.8–1.2)
Prothrombin Time: 14.6 seconds (ref 11.4–15.2)

## 2018-05-19 LAB — VITAMIN B12: Vitamin B-12: 221 pg/mL (ref 180–914)

## 2018-05-19 NOTE — Progress Notes (Signed)
[place] at [place] NAME: Brianna Barnes    MR#:  [id number]  DATE OF BIRTH:  08/14/1928  SUBJECTIVE: Patient denies any shortness of breath, abdominal pain.  Had EGD yesterday AV malformation in duodenum, cauterized, discussed with gastroenterology.  He did not have any gastric or esophageal varices.  Patient had history of DVT leg after orthopedic surgery in December, ultrasound study done yesterday showed patient has DVT in the left leg.  CHIEF COMPLAINT:   Chief Complaint  Patient presents with  . Hematemesis    REVIEW OF SYSTEMS:   ROS CONSTITUTIONAL: No fever, fatigue or weakness.  EYES: No blurred or double vision.  EARS, NOSE, AND THROAT: No tinnitus or ear pain.  RESPIRATORY: No cough, shortness of breath, wheezing or hemoptysis.  CARDIOVASCULAR: No chest pain, orthopnea, edema.  GASTROINTESTINAL: No nausea, vomiting, diarrhea or abdominal pain.  GENITOURINARY: No dysuria, hematuria.  ENDOCRINE: No polyuria, nocturia,  HEMATOLOGY: No anemia, easy bruising or bleeding SKIN: No rash or lesion. MUSCULOSKELETAL: No joint pain or arthritis.   NEUROLOGIC: No tingling, numbness, weakness.  PSYCHIATRY: No anxiety or depression.   DRUG ALLERGIES:  No Known Allergies  VITALS:  Blood pressure (!) 151/53, pulse (!) 59, temperature 98.4 F (36.9 C), temperature source Oral, resp. rate 18, height 4\' 10"  (1.473 m), weight 47.8 kg, SpO2 95 %.  PHYSICAL EXAMINATION:  GENERAL:  85 y.o.-year-old patient lying in the bed with no acute distress.  EYES: Pupils equal, round, reactive to light and accommodation. No scleral icterus. Extraocular muscles intact.  HEENT: Head atraumatic, normocephalic. Oropharynx and nasopharynx clear.  NECK:  Supple, no jugular venous distention. No thyroid enlargement, no tenderness.  LUNGS: Normal breath sounds bilaterally, no wheezing, rales,rhonchi or crepitation. No use of accessory muscles of  respiration.  CARDIOVASCULAR: S1, S2 normal. No murmurs, rubs, or gallops.  ABDOMEN: Soft, nontender, nondistended. Bowel sounds present. No organomegaly or mass.  EXTREMITIES: No pedal edema, cyanosis, or clubbing.  NEUROLOGIC: Cranial nerves II through XII are intact. Muscle strength 5/5 in all extremities. Sensation intact. Gait not checked.  PSYCHIATRIC: The patient is alert and oriented x 3.  SKIN: No obvious rash, lesion, or ulcer.    LABORATORY PANEL:   CBC Recent Labs  Lab 05/19/18 0422  WBC 3.9*  HGB 11.6*  HCT 35.8*  PLT 145*   ------------------------------------------------------------------------------------------------------------------  Chemistries  Recent Labs  Lab 05/16/18 0400  NA 140  K 3.8  CL 109  CO2 23  GLUCOSE 97  BUN 10  CREATININE 0.39*  CALCIUM 7.6*  AST 33  ALT 20  ALKPHOS 75  BILITOT 0.3   ------------------------------------------------------------------------------------------------------------------  Cardiac Enzymes No results for input(s): TROPONINI in the last 168 hours. ------------------------------------------------------------------------------------------------------------------  RADIOLOGY:  US Venous Img Lower Bilateral  Result Date: 05/18/2018 CLINICAL DATA:  History of DVT EXAM: RIGHT LOWER EXTREMITY VENOUS DUPLEX ULTRASOUND TECHNIQUE: Doppler venous assessment of the right lower extremity deep venous system was performed, including characterization of spectral flow, compressibility, and phasicity. COMPARISON:  None. FINDINGS: There is complete compressibility of the right common femoral and femoral veins. The popliteal vein is partially compressible. There is partially occlusive thrombus within the popliteal vein. This is characterized by hypoechoic signal filling the popliteal vein with a channel of flow traversing through the thrombus. This has a subacute to chronic appearance. There is no obvious DVT in the calf. There is no  superficial thrombosis. Doppler analysis demonstrates phasicity with augmentation on calf compression. IMPRESSION: The study  is positive for partially occlusive DVT in the right popliteal vein. It has a subacute to chronic appearance. Electronically Signed   By: Marybelle Killings M.D.   On: 05/18/2018 16:45    EKG:   Orders placed or performed during the hospital encounter of 01/21/18  . EKG 12-Lead  . EKG 12-Lead  . EKG 12-Lead  . EKG 12-Lead  . EKG    ASSESSMENT AND PLAN:  Hematemesis with upper GI bleed, status post EGD showing nonbleeding AV malformations in the duodenum status post cauterization, did not have any gastric or esophageal varices as per EGD report.  Will discontinue Protonix, octreotide drip.  Hemoglobin stable.   # history of DVT in the right leg, patient was taking Eliquis since December of last year, ultrasound repeated yesterday showed DVT in the left leg.  Ordered ultrasound of bilateral legs yesterday but radiologist commented on DVT in the left leg.  Will order ultrasound again to evaluate for and follow-up of right leg DVT.   #3. history of cryptogenic cirrhosis, has family history of cirrhosis as per patient daughter.,  Start patient is on antibiotics for SBP prophylaxis. #4. spoke with patient's daughter Neoma Laming over the phone, would like to have ultrasound follow-up for evaluation of right leg DVT.  Patient ultrasound showed left leg DVT at this time.  So will order ultrasound to evaluate for DVT that was present by ultrasound in December 2019.  All the records are reviewed and case discussed with Care Management/Social Workerr. Management plans discussed with the patient, family and they are in agreement.  CODE STATUS: DNR  TOTAL TIME TAKING CARE OF THIS PATIENT: 35 minutes.   POSSIBLE D/C IN 1-2 DAYS, DEPENDING ON CLINICAL CONDITION. More than 50% time spent in counseling, coordination of care  Epifanio Lesches M.D on 05/19/2018 at 12:24 PM  Between 7am  to 6pm - Pager - [phone]  After 6pm go to [email or web address] - password [email or web address] [facility]  Office  [phone]  CC: Primary care physician; Jinny Sanders, MD   Note: This dictation was prepared with Dragon dictation along with smaller phrase technology. Any transcriptional errors that result from this process are unintentional.

## 2018-05-19 NOTE — Progress Notes (Signed)
[place] for Warfarin  Indication: history of DVT   No Known Allergies  Patient Measurements: Height: 4\' 10"  (147.3 cm) Weight: 105 lb 4.8 oz (47.8 kg) IBW/kg (Calculated) : 40.9 Heparin Dosing Weight:   Vital Signs: Temp: 98.5 F (36.9 C) (03/06 0533) Temp Source: Oral (03/06 0533) BP: 136/81 (03/06 0535) Pulse Rate: 67 (03/06 0535)  Labs: Recent Labs    05/16/18 1328 05/18/18 1043 05/19/18 0422  HGB 11.0* 11.1* 11.6*  HCT 34.3* 34.6* 35.8*  PLT  --  143* 145*  LABPROT  --   --  14.6  INR  --   --  1.2    Estimated Creatinine Clearance: 32 mL/min (A) (by C-G formula based on SCr of 0.39 mg/dL (L)).   Medical History: Past Medical History:  Diagnosis Date  . Choledocholithiasis   . Cirrhosis, cryptogenic (HCC)    FOLLOWED BY DR PYRTLE-- LOV  IN EPIC--  STABLE PER NOTE  . Cryptogenic cirrhosis ([place])   . Esophageal varix ([place])   . GERD (gastroesophageal reflux disease)   . History of colonoscopy with polypectomy    ADENOMATOUS AND HYPERPLASIA POLYPS  (2008  &  2010)  . History of hepatitis C   . History of positive PPD    + TB SKIN TEST WITHOUT TB  . Hyperlipidemia   . Hypertension, portal ([place])    SECONDARY TO CRYPTOGENIC CIRRHOSIS  . Internal hemorrhoids   . Iron deficiency anemia   . Loss of hearing    right ear  . Osteoarthritis    hands, knees, and low back  . Osteoporosis   . PUD (peptic ulcer disease)   . Tubular adenoma of colon   . Uterovaginal prolapse, incomplete   . Wears glasses   . Wears hearing aid    left ear only    Medications:  Medications Prior to Admission  Medication Sig Dispense Refill Last Dose  . alendronate (FOSAMAX) 70 MG tablet TAKE 1 TABLET BY MOUTH ONCE A WEEK, AS DIRECTED 12 tablet 0 Past Week at Unknown time  . apixaban (ELIQUIS) 5 MG TABS tablet Take 1 tablet (5 mg total) by mouth 2 (two) times daily. 60 tablet 4 05/15/2018 at 0800  . Calcium-Phosphorus-Vitamin D (CALCIUM  GUMMIES PO) Take 1 tablet by mouth daily.   05/15/2018 at 0800  . diclofenac sodium (VOLTAREN) 1 % GEL Apply 4 g topically 4 (four) times daily. 100 g 11 05/15/2018 at Unknown time  . dimenhyDRINATE (DRAMAMINE) 50 MG tablet Take 50 mg by mouth every 8 (eight) hours as needed for dizziness.   prn at prn  . ibuprofen (ADVIL,MOTRIN) 200 MG tablet Take 400 mg by mouth daily as needed for headache or mild pain.    prn at prn  . Iron Combinations (IRON COMPLEX PO) Take 1 tablet by mouth daily.   05/15/2018 at 0800  . lidocaine (LIDODERM) 5 % Place 1 patch onto the skin daily. Remove & Discard patch within 12 hours or as directed by MD 30 patch 0 05/15/2018 at Unknown time  . pantoprazole (PROTONIX) 40 MG tablet Take 1 tablet (40 mg total) by mouth daily before breakfast. 90 tablet 1 05/15/2018 at 0800  . sennosides-docusate sodium (SENOKOT-S) 8.6-50 MG tablet Take 1 tablet by mouth daily as needed for constipation.   prn at prn  . timolol (TIMOPTIC) 0.5 % ophthalmic solution Place 1 drop into both eyes daily.    05/15/2018 at 0800  . traMADol (ULTRAM) 50 MG  tablet TAKE ONE-HALF TO ONE TABLET BY MOUTH UP TO EVERY 12 HOURS AS NEEDED FOR MODERATE PAIN. 30 tablet 0 prn at prn  . traZODone (DESYREL) 150 MG tablet Take 1 tablet (150 mg total) by mouth at bedtime. 30 tablet 1 05/14/2018 at 2000  . vitamin E 400 UNIT capsule Take 400 Units by mouth daily.   05/15/2018 at 0800  . simethicone (MYLICON) 977 MG chewable tablet Chew 125 mg by mouth every 6 (six) hours as needed for flatulence.   prn at prn    Assessment: Pt developed DVT in 02/2018 secondary to surgical repair of hip fracture.  Was treated outpt with Eliquis 5 mg PO BID since 03/09/2018 but developed significant GI bleed. GI lesions are currently no longer bleeding.  MD wants to rechallenge her with anticoagulation using warfarin to see if her lesions resume bleeding.   MD suggested giving warfarin 2.5 mg PO X 2 .   3/5: INR = 1.1 Warfarin 2.5 mg ,  Hct = 34.6,  Hgb  = 11  3/6  INR 1.2  Goal of Therapy:  Pt can safely tolerate anticoag with warfarin.     Plan:  Will continue the order for warfarin 2.5 mg PO Q-1800 X 2 doses, per MD request.   Will check INR and CBC on 3/6 with AM labs.   Joany Khatib A 05/19/2018,7:38 AM

## 2018-05-19 NOTE — Progress Notes (Signed)
Spoke with patient's daughter Neoma Laming over the phone, I spoke with the radiologist, radiologist mentioned that patient has no DVT in the left leg, now has DVT in the right leg.  The previous DVT in the left leg is resolved.

## 2018-05-19 NOTE — Plan of Care (Signed)
  Problem: Education: Goal: Knowledge of General Education information will improve Description: Including pain rating scale, medication(s)/side effects and non-pharmacologic comfort measures Outcome: Progressing   Problem: Safety: Goal: Ability to remain free from injury will improve Outcome: Progressing   

## 2018-05-19 NOTE — Care Management Important Message (Signed)
Copy of signed Medicare IM left with patient in room. 

## 2018-05-19 NOTE — Progress Notes (Signed)
Information printed out on coumadin and DVT and given to patient's daughter. Pharmacy to also go in and educated patient and family. Will continue to monitor patient.

## 2018-05-20 LAB — CBC
HCT: 34.5 % — ABNORMAL LOW (ref 36.0–46.0)
Hemoglobin: 11.3 g/dL — ABNORMAL LOW (ref 12.0–15.0)
MCH: 29.7 pg (ref 26.0–34.0)
MCHC: 32.8 g/dL (ref 30.0–36.0)
MCV: 90.6 fL (ref 80.0–100.0)
Platelets: 128 10*3/uL — ABNORMAL LOW (ref 150–400)
RBC: 3.81 MIL/uL — ABNORMAL LOW (ref 3.87–5.11)
RDW: 13.7 % (ref 11.5–15.5)
WBC: 3.6 10*3/uL — ABNORMAL LOW (ref 4.0–10.5)
nRBC: 0 % (ref 0.0–0.2)

## 2018-05-20 LAB — PROTIME-INR
INR: 1.3 — ABNORMAL HIGH (ref 0.8–1.2)
Prothrombin Time: 15.6 seconds — ABNORMAL HIGH (ref 11.4–15.2)

## 2018-05-20 MED ORDER — WARFARIN SODIUM 2.5 MG PO TABS
2.5000 mg | ORAL_TABLET | Freq: Once | ORAL | Status: DC
  Filled 2018-05-20: qty 1

## 2018-05-20 MED ORDER — WARFARIN SODIUM 2.5 MG PO TABS: 2.5000 mg | ORAL_TABLET | Freq: Every day | ORAL | 1 refills | Status: DC

## 2018-05-20 NOTE — Plan of Care (Signed)
  Problem: Education: Goal: Knowledge of General Education information will improve Description: Including pain rating scale, medication(s)/side effects and non-pharmacologic comfort measures Outcome: Progressing   Problem: Pain Managment: Goal: General experience of comfort will improve Outcome: Progressing   Problem: Safety: Goal: Ability to remain free from injury will improve Outcome: Progressing   

## 2018-05-20 NOTE — Care Management Note (Signed)
Case Management Note  Patient Details  Name: Brianna Barnes MRN: [id number] Date of Birth: 06/13/1928  Subjective/Objective:   Patient to be discharged per MD order. Orders in place for home health services. CMS Medicare.gov Compare Post Acute Care list reviewed with patient has no preference of agency. Referral placed with [facility]. No DME needs. Family to transport.                  Action/Plan:   Expected Discharge Date:  05/20/18               Expected Discharge Plan:  Brianna  In-House Referral:     Discharge planning Services  CM Consult  Post Acute Care Choice:  Home Health Choice offered to:  Patient  DME Arranged:    DME Agency:     HH Arranged:  RN, PT, Nurse's Aide [place] Agency:  [facility]  Status of Service:  Completed, signed off  If discussed at [place] of Stay Meetings, dates discussed:    Additional Comments:  [name] A Olinda Nola, RN 05/20/2018, 1:15 PM

## 2018-05-20 NOTE — Progress Notes (Signed)
[place] for Warfarin  Indication: history of DVT   No Known Allergies  Patient Measurements: Height: 4\' 10"  (147.3 cm) Weight: 104 lb 8 oz (47.4 kg) IBW/kg (Calculated) : 40.9 Heparin Dosing Weight:   Vital Signs: Temp: 98.5 F (36.9 C) (03/07 0801) Temp Source: Oral (03/07 0344) BP: 143/58 (03/07 0801) Pulse Rate: 61 (03/07 0801)  Labs: Recent Labs    05/18/18 1043 05/19/18 0422 05/20/18 0534  HGB 11.1* 11.6* 11.3*  HCT 34.6* 35.8* 34.5*  PLT 143* 145* 128*  LABPROT  --  14.6 15.6*  INR  --  1.2 1.3*    Estimated Creatinine Clearance: 32 mL/min (A) (by C-G formula based on SCr of 0.39 mg/dL (L)).   Medical History: Past Medical History:  Diagnosis Date  . Choledocholithiasis   . Cirrhosis, cryptogenic (HCC)    FOLLOWED BY DR PYRTLE-- LOV  IN EPIC--  STABLE PER NOTE  . Cryptogenic cirrhosis ([place])   . Esophageal varix ([place])   . GERD (gastroesophageal reflux disease)   . History of colonoscopy with polypectomy    ADENOMATOUS AND HYPERPLASIA POLYPS  (2008  &  2010)  . History of hepatitis C   . History of positive PPD    + TB SKIN TEST WITHOUT TB  . Hyperlipidemia   . Hypertension, portal ([place])    SECONDARY TO CRYPTOGENIC CIRRHOSIS  . Internal hemorrhoids   . Iron deficiency anemia   . Loss of hearing    right ear  . Osteoarthritis    hands, knees, and low back  . Osteoporosis   . PUD (peptic ulcer disease)   . Tubular adenoma of colon   . Uterovaginal prolapse, incomplete   . Wears glasses   . Wears hearing aid    left ear only    Medications:  Medications Prior to Admission  Medication Sig Dispense Refill Last Dose  . alendronate (FOSAMAX) 70 MG tablet TAKE 1 TABLET BY MOUTH ONCE A WEEK, AS DIRECTED 12 tablet 0 Past Week at Unknown time  . apixaban (ELIQUIS) 5 MG TABS tablet Take 1 tablet (5 mg total) by mouth 2 (two) times daily. 60 tablet 4 05/15/2018 at 0800  . Calcium-Phosphorus-Vitamin D (CALCIUM  GUMMIES PO) Take 1 tablet by mouth daily.   05/15/2018 at 0800  . diclofenac sodium (VOLTAREN) 1 % GEL Apply 4 g topically 4 (four) times daily. 100 g 11 05/15/2018 at Unknown time  . dimenhyDRINATE (DRAMAMINE) 50 MG tablet Take 50 mg by mouth every 8 (eight) hours as needed for dizziness.   prn at prn  . ibuprofen (ADVIL,MOTRIN) 200 MG tablet Take 400 mg by mouth daily as needed for headache or mild pain.    prn at prn  . Iron Combinations (IRON COMPLEX PO) Take 1 tablet by mouth daily.   05/15/2018 at 0800  . lidocaine (LIDODERM) 5 % Place 1 patch onto the skin daily. Remove & Discard patch within 12 hours or as directed by MD 30 patch 0 05/15/2018 at Unknown time  . pantoprazole (PROTONIX) 40 MG tablet Take 1 tablet (40 mg total) by mouth daily before breakfast. 90 tablet 1 05/15/2018 at 0800  . sennosides-docusate sodium (SENOKOT-S) 8.6-50 MG tablet Take 1 tablet by mouth daily as needed for constipation.   prn at prn  . timolol (TIMOPTIC) 0.5 % ophthalmic solution Place 1 drop into both eyes daily.    05/15/2018 at 0800  . traMADol (ULTRAM) 50 MG tablet TAKE ONE-HALF TO ONE TABLET  BY MOUTH UP TO EVERY 12 HOURS AS NEEDED FOR MODERATE PAIN. 30 tablet 0 prn at prn  . traZODone (DESYREL) 150 MG tablet Take 1 tablet (150 mg total) by mouth at bedtime. 30 tablet 1 05/14/2018 at 2000  . vitamin E 400 UNIT capsule Take 400 Units by mouth daily.   05/15/2018 at 0800  . simethicone (MYLICON) 563 MG chewable tablet Chew 125 mg by mouth every 6 (six) hours as needed for flatulence.   prn at prn    Assessment: Pt developed DVT in 02/2018 secondary to surgical repair of hip fracture.  Was treated outpt with Eliquis 5 mg PO BID since 03/09/2018 but developed significant GI bleed. GI lesions are currently no longer bleeding.  MD wants to rechallenge her with anticoagulation using warfarin to see if her lesions resume bleeding.   MD suggested giving warfarin 2.5 mg PO X 2 .   3/5: INR = 1.1 Warfarin 2.5 mg ,  Hct = 34.6,  Hgb  = 11  3/6  INR = 1.2 Warfarin 2.5 mg 3/7  INR = 1.3   Goal of Therapy:  Pt can safely tolerate anticoag with warfarin.     Plan:  Will order warfarin 2.5 mg PO Q-1800.   Will check INR daily and CBC every 3 days as per protocol.   Jakaya Jacobowitz K 05/20/2018,1:20 PM

## 2018-05-20 NOTE — Discharge Instructions (Signed)
Hematemesis °Hematemesis is when you vomit blood. It is a sign of bleeding in the upper GI tract (gastrointestinal tract). The upper GI tract includes the mouth, throat, esophagus, stomach, and the upper part of the small intestine (duodenum). Hematemesis is usually caused by bleeding in the esophagus or stomach. °You may suddenly vomit bright red blood. Or, the blood may look like coffee grounds. You may also have other symptoms, such as: °· Stomach pain. °· Heartburn. °· Stool (feces) that looks black and tarry. °Follow these instructions at home: ° °· Take over-the-counter and prescription medicines only as told by your health care provider. °· Do not take NSAIDs, including aspirin and ibuprofen, unless your health care provider approves. These medicines can increase bleeding. °· Rest as needed. °· Drink enough fluids to keep your urine pale yellow. Take small sips of fluid at a time. °· Do not drink alcohol. °· Do not use any products that contain nicotine or tobacco, such as cigarettes and e-cigarettes. If you need help quitting, ask your health care provider. °· Keep all follow-up visits as told by your health care provider. This is important. °Contact a health care provider if: °· You have more blood in your vomit. °· Your vomiting of blood begins again after it has stopped. °· You have persistent stomach pain. °· You have nausea, indigestion, or heartburn. °Get help right away if: °· You faint. °· You feel weak or dizzy. °· You are urinating less than normal or not at all. °· You vomit up: °? Large amounts of blood or dark material that may look like coffee grounds. °? Bright red blood. °· You have any of the following: °? Persistent vomiting. °? A rapid heartbeat. °? Blood in your stool. °? Chest pain. °? Difficulty breathing. °These symptoms may represent a serious problem that is an emergency. Do not wait to see if the symptoms will go away. Get medical help right away. Call your local emergency services  (911 in the [place]). Do not drive yourself to the hospital. °Summary °· Hematemesis is when you vomit blood. It is a sign of bleeding in the upper GI tract (gastrointestinal tract). °· Hematemesis is usually caused by bleeding in the esophagus or stomach. °· Do not take NSAIDs (including aspirin and ibuprofen), drink alcohol, or use tobacco products. °· Take over-the-counter and prescription medicines only as told by your health care provider. °This information is not intended to replace advice given to you by your health care provider. Make sure you discuss any questions you have with your health care provider. °Document Released: 04/08/2004 Document Revised: 03/11/2017 Document Reviewed: 03/11/2017 °Elsevier Interactive Patient Education © 2019 [employer]vier Inc. ° °

## 2018-05-20 NOTE — Discharge Summary (Signed)
[facility] NAME: Brianna Barnes    MR#:  [id number]  DATE OF BIRTH:  [birth year]  DATE OF ADMISSION:  05/15/2018   ADMITTING PHYSICIAN: Sedalia Muta, MD  DATE OF DISCHARGE:  05/20/2018  PRIMARY CARE PHYSICIAN: Jinny Sanders, MD   ADMISSION DIAGNOSIS:   Gastrointestinal hemorrhage, unspecified gastrointestinal hemorrhage type [K92.2]  DISCHARGE DIAGNOSIS:   Active Problems:   GI bleed   SECONDARY DIAGNOSIS:   Past Medical History:  Diagnosis Date  . Choledocholithiasis   . Cirrhosis, cryptogenic (HCC)    FOLLOWED BY DR PYRTLE-- LOV  IN EPIC--  STABLE PER NOTE  . Cryptogenic cirrhosis ([place])   . Esophageal varix ([place])   . GERD (gastroesophageal reflux disease)   . History of colonoscopy with polypectomy    ADENOMATOUS AND HYPERPLASIA POLYPS  (2008  &  2010)  . History of hepatitis C   . History of positive PPD    + TB SKIN TEST WITHOUT TB  . Hyperlipidemia   . Hypertension, portal ([place])    SECONDARY TO CRYPTOGENIC CIRRHOSIS  . Internal hemorrhoids   . Iron deficiency anemia   . Loss of hearing    right ear  . Osteoarthritis    hands, knees, and low back  . Osteoporosis   . PUD (peptic ulcer disease)   . Tubular adenoma of colon   . Uterovaginal prolapse, incomplete   . Wears glasses   . Wears hearing aid    left ear only    HOSPITAL COURSE:   85 year old female with past medical history significant for cryptogenic cirrhosis with portal hypertension and splenomegaly, GERD, history of hepatitis C presents to hospital secondary to hematemesis.  1.  Upper GI bleed-appreciate GI consult.  Status post EGD and showing angiodysplastic lesions in the duodenal bulb status post argon coagulation -No active bleeding at this time.  Hemoglobin is stable.  Eliquis has been held  2.  Chronic iron deficiency anemia-continue Protonix.  Status post EGD this admission showing angiodysplasias in the duodenum. -Hemoglobin is  stable.  No transfusion.  Received IV iron.  Continue iron supplements at discharge -Eliquis is being discontinued and patient will be started on Coumadin.  3.  History of DVT-has been taking Eliquis at home.  Now with the active GI bleed, Eliquis has been discontinued. -Repeat Doppler showing chronic nonocclusive right lower extremity clot.  Options of IVC filter versus Coumadin has been discussed by GI.  Updated son at bedside, they opted for starting Coumadin which has been started in the hospital.  Will aim for an INR between 2 and 2.5 -Coumadin will be continued at discharge.  INR check advised in 2 to 3 days. -Patient will be followed by home health.  4.  Osteoporosis-continue home medications.  Avoid NSAIDs  Updated son at bedside. -Patient will be discharged home today  DISCHARGE CONDITIONS:   Guarded  CONSULTS OBTAINED:   GI consult  DRUG ALLERGIES:   No Known Allergies DISCHARGE MEDICATIONS:   Allergies as of 05/20/2018   No Known Allergies     Medication List    STOP taking these medications   apixaban 5 MG Tabs tablet Commonly known as:  Eliquis   ibuprofen 200 MG tablet Commonly known as:  ADVIL,MOTRIN     TAKE these medications   alendronate 70 MG tablet Commonly known as:  FOSAMAX TAKE 1 TABLET BY MOUTH ONCE A WEEK, AS DIRECTED   CALCIUM GUMMIES PO Take 1 tablet  by mouth daily.   diclofenac sodium 1 % Gel Commonly known as:  VOLTAREN Apply 4 g topically 4 (four) times daily.   dimenhyDRINATE 50 MG tablet Commonly known as:  DRAMAMINE Take 50 mg by mouth every 8 (eight) hours as needed for dizziness.   IRON COMPLEX PO Take 1 tablet by mouth daily.   lidocaine 5 % Commonly known as:  LIDODERM Place 1 patch onto the skin daily. Remove & Discard patch within 12 hours or as directed by MD   pantoprazole 40 MG tablet Commonly known as:  PROTONIX Take 1 tablet (40 mg total) by mouth daily before breakfast.   sennosides-docusate sodium 8.6-50 MG  tablet Commonly known as:  SENOKOT-S Take 1 tablet by mouth daily as needed for constipation.   simethicone 125 MG chewable tablet Commonly known as:  MYLICON Chew 546 mg by mouth every 6 (six) hours as needed for flatulence.   timolol 0.5 % ophthalmic solution Commonly known as:  TIMOPTIC Place 1 drop into both eyes daily.   traMADol 50 MG tablet Commonly known as:  ULTRAM TAKE ONE-HALF TO ONE TABLET BY MOUTH UP TO EVERY 12 HOURS AS NEEDED FOR MODERATE PAIN.   traZODone 150 MG tablet Commonly known as:  DESYREL Take 1 tablet (150 mg total) by mouth at bedtime.   vitamin E 400 UNIT capsule Take 400 Units by mouth daily.   warfarin 2.5 MG tablet Commonly known as:  Coumadin Take 1 tablet (2.5 mg total) by mouth daily.        DISCHARGE INSTRUCTIONS:   1.  INR check in 3 days 2.  PCP follow-up in 1 to 2 weeks  DIET:   Cardiac diet  ACTIVITY:   Activity as tolerated  OXYGEN:   Home Oxygen: No.  Oxygen Delivery: room air  DISCHARGE LOCATION:   home   If you experience worsening of your admission symptoms, develop shortness of breath, life threatening emergency, suicidal or homicidal thoughts you must seek medical attention immediately by calling 911 or calling your MD immediately  if symptoms less severe.  You Must read complete instructions/literature along with all the possible adverse reactions/side effects for all the Medicines you take and that have been prescribed to you. Take any new Medicines after you have completely understood and accpet all the possible adverse reactions/side effects.   Please note  You were cared for by a hospitalist during your hospital stay. If you have any questions about your discharge medications or the care you received while you were in the hospital after you are discharged, you can call the unit and asked to speak with the hospitalist on call if the hospitalist that took care of you is not available. Once you are discharged,  your primary care physician will handle any further medical issues. Please note that NO REFILLS for any discharge medications will be authorized once you are discharged, as it is imperative that you return to your primary care physician (or establish a relationship with a primary care physician if you do not have one) for your aftercare needs so that they can reassess your need for medications and monitor your lab values.    On the day of Discharge:  VITAL SIGNS:   Blood pressure (!) 143/58, pulse 61, temperature 98.5 F (36.9 C), resp. rate 20, height 4\' 10"  (1.473 m), weight 47.4 kg, SpO2 94 %.  PHYSICAL EXAMINATION:    GENERAL:  85 y.o.-year-old thin built ill nourished patient lying in the bed with no  acute distress.  EYES: Pupils equal, round, reactive to light and accommodation. No scleral icterus. Extraocular muscles intact.  HEENT: Head atraumatic, normocephalic. Oropharynx and nasopharynx clear.  NECK:  Supple, no jugular venous distention. No thyroid enlargement, no tenderness.  LUNGS: Normal breath sounds bilaterally, no wheezing, rales,rhonchi or crepitation. No use of accessory muscles of respiration.  Decreased bibasilar breath sounds CARDIOVASCULAR: S1, S2 normal. No  rubs, or gallops.  2/6 systolic murmur is present ABDOMEN: Soft, non-tender, non-distended. Bowel sounds present. No organomegaly or mass.  EXTREMITIES: No pedal edema, cyanosis, or clubbing.  NEUROLOGIC: Cranial nerves II through XII are intact. Muscle strength 5/5 in all extremities. Sensation intact. Gait not checked.  PSYCHIATRIC: The patient is alert and oriented x 3.  SKIN: No obvious rash, lesion, or ulcer.   DATA REVIEW:   CBC Recent Labs  Lab 05/20/18 0534  WBC 3.6*  HGB 11.3*  HCT 34.5*  PLT 128*    Chemistries  Recent Labs  Lab 05/16/18 0400  NA 140  K 3.8  CL 109  CO2 23  GLUCOSE 97  BUN 10  CREATININE 0.39*  CALCIUM 7.6*  AST 33  ALT 20  ALKPHOS 75  BILITOT 0.3      Microbiology Results  Results for orders placed or performed during the hospital encounter of 01/21/18  Surgical pcr screen     Status: None   Collection Time: 01/22/18 12:24 AM  Result Value Ref Range Status   MRSA, PCR NEGATIVE NEGATIVE Final   Staphylococcus aureus NEGATIVE NEGATIVE Final    Comment: (NOTE) The Xpert SA Assay (FDA approved for NASAL specimens in patients 6 years of age and older), is one component of a comprehensive surveillance program. It is not intended to diagnose infection nor to guide or monitor treatment. Performed at [facility], [place] [address] [place], [place] [address]   Urine culture     Status: None   Collection Time: 01/22/18 12:53 AM  Result Value Ref Range Status   Specimen Description URINE, CATHETERIZED  Final   Special Requests NONE  Final   Culture   Final    NO GROWTH Performed at [facility], [address], [place], [place] [address]    Report Status 01/23/2018 FINAL  Final    RADIOLOGY:  No results found.   Management plans discussed with the patient, family and they are in agreement.  CODE STATUS:     Code Status Orders  (From admission, onward)         Start     Ordered   05/16/18 0137  Do not attempt resuscitation (DNR)  Continuous    Question Answer Comment  In the event of cardiac or respiratory ARREST Do not call a "code blue"   In the event of cardiac or respiratory ARREST Do not perform Intubation, CPR, defibrillation or ACLS   In the event of cardiac or respiratory ARREST Use medication by any route, position, wound care, and other measures to relive pain and suffering. May use oxygen, suction and manual treatment of airway obstruction as needed for comfort.      05/16/18 0136        Code Status History    Date Active Date Inactive Code Status Order ID Comments User Context   01/21/2018 2359 01/25/2018 2012 DNR [id number]  Toy Baker, MD Inpatient   08/25/2011 0514 08/27/2011 0904  Full Code [id number]  Cornett, Joyice Faster., MD ED    Advance Directive Documentation  Most Recent Value  Type of Advance Directive  Healthcare Power of Attorney  Pre-existing out of facility DNR order (yellow form or pink MOST form)  -  "MOST" Form in Place?  -      TOTAL TIME TAKING CARE OF THIS PATIENT: 38 minutes.    Gladstone Lighter M.D on 05/20/2018 at 11:59 AM  Between 7am to 6pm - Pager - [phone]  After 6pm go to [email or web address] - Proofreader  [facility]  Office  [phone]  CC: Primary care physician; Jinny Sanders, MD   Note: This dictation was prepared with Dragon dictation along with smaller phrase technology. Any transcriptional errors that result from this process are unintentional.

## 2018-05-22 ENCOUNTER — Telehealth: Payer: Self-pay

## 2018-05-22 NOTE — Telephone Encounter (Signed)
Deborah Bradshaw,patient's daughter,called.  She can be reached at [phone] to schedule appointment for protime.

## 2018-05-22 NOTE — Telephone Encounter (Signed)
LM requesting call back to complete TCM and schedule hospital follow up.   

## 2018-05-22 NOTE — Telephone Encounter (Signed)
Patient admitted with GI bleed to hospital last week.  She was on Eliquis and has been switched over to Coumadin 2.5mg  daily now.  First dose on 05/18/18.    I discussed with patient's Daughter that proper procedure is that I should recheck her tomorrow 3/10.  I explained to them the process of the coumadin clinic and they feel very comfortable with this arrangement.  Also, I have set up a hospital follow up for patient with Dr. Diona Browner for this Friday.  Daughter verbalizes understanding and will keep appointments.

## 2018-05-23 ENCOUNTER — Ambulatory Visit (INDEPENDENT_AMBULATORY_CARE_PROVIDER_SITE_OTHER): Payer: Medicare Other

## 2018-05-23 DIAGNOSIS — Z7901 Long term (current) use of anticoagulants: Secondary | ICD-10-CM | POA: Diagnosis not present

## 2018-05-23 DIAGNOSIS — I82409 Acute embolism and thrombosis of unspecified deep veins of unspecified lower extremity: Secondary | ICD-10-CM | POA: Insufficient documentation

## 2018-05-23 LAB — POCT INR: INR: 1.4 — AB (ref 2.0–3.0)

## 2018-05-23 NOTE — Patient Instructions (Signed)
INR today 1.4 *Missed 1 dose yesterday (3/9)  Patient is to take 5mg  (2 pills) today (3/10) and 5mg  (2 pills) tomorrow (3/11) and then resume 2.5mg  daily until Recheck this Friday (3/13/).   Patient provided with general coumadin education with handouts given to reinforce teaching.  Daughter present today and supportive of patient.  Also, aware of risks related to sub and supratherapeutic readings and what to do in any concerning event.  Patient and daughter allowed time to asked questions and verbalize understanding of all instructions.

## 2018-05-23 NOTE — Telephone Encounter (Signed)
Spoke with Daughter through Son in [place].   Transition Care Management Follow-up Telephone Call   Date discharged? 05/20/18   How have you been since you were released from the hospital? Doing very well no questions or concerns.    Do you understand why you were in the hospital? Yes   Do you understand the discharge instructions? Yes   Where were you discharged to?  Home with home health and lots of family support.    Items Reviewed:  Medications reviewed: Yes  Allergies reviewed: Yes  Dietary changes reviewed: No changes  Referrals reviewed: Yes patient has follow up with Dr. Barbera Setters) and Dr. Hilarie Fredrickson (GI) Also, home health has been set up to start this Thursday.    Functional Questionnaire:   Activities of Daily Living (ADLs):   She states they are independent in the following: dressing, bathing, feeding, grooming, toileting, ambulation with use of a cane.  Has walker if needed for longer distances.   States they require assistance with the following: Daughter helps with daily meal prep and pill box set up.    Any transportation issues/concerns?: None   Any patient concerns? No concerns.    Confirmed importance and date/time of follow-up visits scheduled Friday, 05/26/18 at 3:00pm   Provider Appointment booked with Dr. Diona Browner and the coumadin clinic.   Confirmed with patient if condition begins to worsen call PCP or go to the ER.  Patient was given the office number and encouraged to call back with question or concerns.  : Yes

## 2018-05-24 ENCOUNTER — Telehealth: Payer: Self-pay | Admitting: Family Medicine

## 2018-05-24 ENCOUNTER — Encounter (INDEPENDENT_AMBULATORY_CARE_PROVIDER_SITE_OTHER): Payer: Self-pay

## 2018-05-24 NOTE — Telephone Encounter (Signed)
Tye [place] (daughter) dropped off FMLA papework In dr bedsole's in box for review and signature

## 2018-05-25 NOTE — Telephone Encounter (Signed)
Can you contact this patient to have her come for a DVT study to see Dr. Delana Meyer and discuss anticoagulation options.

## 2018-05-26 ENCOUNTER — Ambulatory Visit (INDEPENDENT_AMBULATORY_CARE_PROVIDER_SITE_OTHER): Payer: Medicare Other

## 2018-05-26 ENCOUNTER — Other Ambulatory Visit: Payer: Self-pay

## 2018-05-26 ENCOUNTER — Ambulatory Visit (INDEPENDENT_AMBULATORY_CARE_PROVIDER_SITE_OTHER): Payer: Medicare Other | Admitting: Family Medicine

## 2018-05-26 ENCOUNTER — Encounter: Payer: Self-pay | Admitting: Family Medicine

## 2018-05-26 VITALS — BP 104/60 | HR 80 | Resp 14 | Ht <= 58 in | Wt 108.5 lb

## 2018-05-26 DIAGNOSIS — I82409 Acute embolism and thrombosis of unspecified deep veins of unspecified lower extremity: Secondary | ICD-10-CM | POA: Diagnosis not present

## 2018-05-26 DIAGNOSIS — M19042 Primary osteoarthritis, left hand: Secondary | ICD-10-CM | POA: Diagnosis not present

## 2018-05-26 DIAGNOSIS — K219 Gastro-esophageal reflux disease without esophagitis: Secondary | ICD-10-CM | POA: Diagnosis not present

## 2018-05-26 DIAGNOSIS — Z8719 Personal history of other diseases of the digestive system: Secondary | ICD-10-CM

## 2018-05-26 DIAGNOSIS — M19041 Primary osteoarthritis, right hand: Secondary | ICD-10-CM | POA: Diagnosis not present

## 2018-05-26 DIAGNOSIS — H409 Unspecified glaucoma: Secondary | ICD-10-CM | POA: Diagnosis not present

## 2018-05-26 DIAGNOSIS — I10 Essential (primary) hypertension: Secondary | ICD-10-CM | POA: Diagnosis not present

## 2018-05-26 DIAGNOSIS — M810 Age-related osteoporosis without current pathological fracture: Secondary | ICD-10-CM | POA: Diagnosis not present

## 2018-05-26 DIAGNOSIS — Z7901 Long term (current) use of anticoagulants: Secondary | ICD-10-CM | POA: Diagnosis not present

## 2018-05-26 DIAGNOSIS — D509 Iron deficiency anemia, unspecified: Secondary | ICD-10-CM | POA: Diagnosis not present

## 2018-05-26 DIAGNOSIS — M170 Bilateral primary osteoarthritis of knee: Secondary | ICD-10-CM | POA: Diagnosis not present

## 2018-05-26 DIAGNOSIS — K7469 Other cirrhosis of liver: Secondary | ICD-10-CM

## 2018-05-26 DIAGNOSIS — E559 Vitamin D deficiency, unspecified: Secondary | ICD-10-CM | POA: Diagnosis not present

## 2018-05-26 DIAGNOSIS — M47816 Spondylosis without myelopathy or radiculopathy, lumbar region: Secondary | ICD-10-CM | POA: Diagnosis not present

## 2018-05-26 DIAGNOSIS — E785 Hyperlipidemia, unspecified: Secondary | ICD-10-CM | POA: Diagnosis not present

## 2018-05-26 DIAGNOSIS — K5900 Constipation, unspecified: Secondary | ICD-10-CM | POA: Diagnosis not present

## 2018-05-26 DIAGNOSIS — K920 Hematemesis: Secondary | ICD-10-CM

## 2018-05-26 DIAGNOSIS — Z0279 Encounter for issue of other medical certificate: Secondary | ICD-10-CM

## 2018-05-26 DIAGNOSIS — M5136 Other intervertebral disc degeneration, lumbar region: Secondary | ICD-10-CM | POA: Diagnosis not present

## 2018-05-26 DIAGNOSIS — K766 Portal hypertension: Secondary | ICD-10-CM | POA: Diagnosis not present

## 2018-05-26 DIAGNOSIS — I8500 Esophageal varices without bleeding: Secondary | ICD-10-CM | POA: Diagnosis not present

## 2018-05-26 DIAGNOSIS — Z9181 History of falling: Secondary | ICD-10-CM | POA: Diagnosis not present

## 2018-05-26 DIAGNOSIS — I6523 Occlusion and stenosis of bilateral carotid arteries: Secondary | ICD-10-CM | POA: Diagnosis not present

## 2018-05-26 DIAGNOSIS — I82401 Acute embolism and thrombosis of unspecified deep veins of right lower extremity: Secondary | ICD-10-CM | POA: Diagnosis not present

## 2018-05-26 LAB — POCT INR: INR: 2 (ref 2.0–3.0)

## 2018-05-26 MED ORDER — TRAMADOL HCL 50 MG PO TABS: ORAL_TABLET | ORAL | 0 refills | Status: DC

## 2018-05-26 NOTE — Patient Instructions (Addendum)
Keep the appt with vascular.  Stop  At coumadin clinic on way out.  Can try flonase 2 sprays per nostril daily  for congestion and allergies.

## 2018-05-26 NOTE — Assessment & Plan Note (Signed)
New  Right sided DVT.Marland Kitchen no sure if was there previously when left was present  or fail of Eliquis.  Pt has follow up with vasculatr in 3 days.  Continue coumadin for now. HAs INR today.

## 2018-05-26 NOTE — Assessment & Plan Note (Signed)
Minimal drop.. continue iron.

## 2018-05-26 NOTE — Patient Instructions (Signed)
INR today 2.0 Patient is to take 2.5mg  (1 pill) daily EXCEPT for 5mg  (2 pills) on Sundays and Thursdays.  Recheck in 1 weeks.   Patient is doing well without any bruising of bleeding.  Will discuss with Dr. Ronalee Belts on Monday restarting her Eliquis.  Family will call and let me know what is decided.  In meantime will plan for continuing of warfarin therapy.  Son and patient both verbalize understanding of all instructions given today.

## 2018-05-26 NOTE — Assessment & Plan Note (Signed)
No further bleed.

## 2018-05-26 NOTE — Progress Notes (Signed)
Subjective:    Patient ID: Brianna Barnes, female    DOB: 10/30/1928, 85 y.o.   MRN: [id number]  HPI  85 year old female with cryptogenic cirrhosis with portal hypertension and splenomegaly presents for follow up hospitalization for GI bleed secondary to angiodysplastic lesions in duodenal bulb possibly and  Admitted 05/15/2018 to 05/20/2018  Summary copied below: 1.Upper GI bleed- Status post EGD and showing angiodysplastic lesions in the duodenal bulb status post argon coagulation -No active bleeding at this time.  Hemoglobin is stable.  Eliquis has been held  2.  Chronic iron deficiency anemia-continue Protonix.  Status post EGD this admission showing angiodysplasias in the duodenum . -Hemoglobin stable.  No transfusion.  Received IV iron.  Continue iron supplements at discharge -Eliquis is being discontinued and patient will be started on Coumadin.  3.  History of DVT-has been taking Eliquis at home since 02/2018 for DVT in left leg.  Now with the active GI bleed, Eliquis has been discontinued. -Repeat Doppler showing clear left but chronic nonocclusive right lower extremity clot.  Options of IVC filter versus Coumadin has been discussed by GI.  Updated son at bedside, they opted for starting Coumadin which has been started in the hospital.  Will aim for an INR between 2 and 2.5 -Coumadin will be continued at discharge.  INR check advised in 2 to 3 days. -Patient will be followed by home health.   05/26/18  Today:  She reports no further bleeding.  She is feeling stronger and more energy.  Has HH coming to her... but PT say  Not need for them.    Has follow up on DVT with vascular. On coumadin INR 1.3 on 3/7 1.4 on 3/10 Changes made and due for repeat today via out anticoag clinic.   Hg on 3/7 was stable at 11.3  Social History /Family History/Past Medical History reviewed in detail and updated in EMR if needed. Blood pressure 104/60, pulse 80, resp. rate 14, height  4\' 10"  (1.473 m), weight 108 lb 8 oz (49.2 kg), SpO2 96 %.  Review of Systems  Constitutional: Negative for fatigue and fever.  HENT: Negative for congestion.   Eyes: Negative for pain.  Respiratory: Negative for cough and shortness of breath.   Cardiovascular: Negative for chest pain, palpitations and leg swelling.  Gastrointestinal: Negative for abdominal pain.  Genitourinary: Negative for dysuria and vaginal bleeding.  Musculoskeletal: Negative for back pain.  Neurological: Negative for syncope, light-headedness and headaches.  Psychiatric/Behavioral: Negative for dysphoric mood.       Objective:   Physical Exam Constitutional:      General: She is not in acute distress.    Appearance: Normal appearance. She is well-developed. She is not ill-appearing or toxic-appearing.  HENT:     Head: Normocephalic.     Right Ear: Hearing, tympanic membrane, ear canal and external ear normal. Tympanic membrane is not erythematous, retracted or bulging.     Left Ear: Hearing, tympanic membrane, ear canal and external ear normal. Tympanic membrane is not erythematous, retracted or bulging.     Nose: No mucosal edema or rhinorrhea.     Right Sinus: No maxillary sinus tenderness or frontal sinus tenderness.     Left Sinus: No maxillary sinus tenderness or frontal sinus tenderness.     Mouth/Throat:     Pharynx: Uvula midline.  Eyes:     General: Lids are normal. Lids are everted, no foreign bodies appreciated.     Conjunctiva/sclera: Conjunctivae normal.  Pupils: Pupils are equal, round, and reactive to light.  Neck:     Musculoskeletal: Normal range of motion and neck supple.     Thyroid: No thyroid mass or thyromegaly.     Vascular: No carotid bruit.     Trachea: Trachea normal.  Cardiovascular:     Rate and Rhythm: Normal rate and regular rhythm.     Pulses: Normal pulses.     Heart sounds: Normal heart sounds, S1 normal and S2 normal. No murmur. No friction rub. No gallop.    Pulmonary:     Effort: Pulmonary effort is normal. No tachypnea or respiratory distress.     Breath sounds: Normal breath sounds. No decreased breath sounds, wheezing, rhonchi or rales.  Abdominal:     General: Bowel sounds are normal.     Palpations: Abdomen is soft.     Tenderness: There is no abdominal tenderness.  Skin:    General: Skin is warm and dry.     Findings: No rash.  Neurological:     Mental Status: She is alert.  Psychiatric:        Mood and Affect: Mood is not anxious or depressed.        Speech: Speech normal.        Behavior: Behavior normal. Behavior is cooperative.        Thought Content: Thought content normal.        Judgment: Judgment normal.           Assessment & Plan:

## 2018-05-29 ENCOUNTER — Ambulatory Visit (INDEPENDENT_AMBULATORY_CARE_PROVIDER_SITE_OTHER): Payer: Medicare Other | Admitting: Vascular Surgery

## 2018-05-29 ENCOUNTER — Ambulatory Visit (INDEPENDENT_AMBULATORY_CARE_PROVIDER_SITE_OTHER): Payer: Medicare Other

## 2018-05-29 ENCOUNTER — Encounter (INDEPENDENT_AMBULATORY_CARE_PROVIDER_SITE_OTHER): Payer: Self-pay | Admitting: Vascular Surgery

## 2018-05-29 ENCOUNTER — Telehealth: Payer: Self-pay | Admitting: Family Medicine

## 2018-05-29 ENCOUNTER — Other Ambulatory Visit: Payer: Self-pay

## 2018-05-29 VITALS — BP 156/82 | HR 72 | Resp 16 | Ht <= 58 in | Wt <= 1120 oz

## 2018-05-29 DIAGNOSIS — I82412 Acute embolism and thrombosis of left femoral vein: Secondary | ICD-10-CM | POA: Diagnosis not present

## 2018-05-29 DIAGNOSIS — D509 Iron deficiency anemia, unspecified: Secondary | ICD-10-CM | POA: Diagnosis not present

## 2018-05-29 DIAGNOSIS — Z7901 Long term (current) use of anticoagulants: Secondary | ICD-10-CM

## 2018-05-29 DIAGNOSIS — M150 Primary generalized (osteo)arthritis: Secondary | ICD-10-CM | POA: Diagnosis not present

## 2018-05-29 DIAGNOSIS — K219 Gastro-esophageal reflux disease without esophagitis: Secondary | ICD-10-CM | POA: Diagnosis not present

## 2018-05-29 DIAGNOSIS — I82409 Acute embolism and thrombosis of unspecified deep veins of unspecified lower extremity: Secondary | ICD-10-CM

## 2018-05-29 DIAGNOSIS — Z79899 Other long term (current) drug therapy: Secondary | ICD-10-CM

## 2018-05-29 DIAGNOSIS — E782 Mixed hyperlipidemia: Secondary | ICD-10-CM

## 2018-05-29 DIAGNOSIS — M159 Polyosteoarthritis, unspecified: Secondary | ICD-10-CM

## 2018-05-29 DIAGNOSIS — Z791 Long term (current) use of non-steroidal anti-inflammatories (NSAID): Secondary | ICD-10-CM

## 2018-05-29 NOTE — Telephone Encounter (Signed)
Verbal orders given to [facility] for skilled nursing 1 x week for 1 week, then 2 x week for 2 weeks, then 1 x week for 3 weeks.  They will fax over PT/INR results for dosing on 06/02/2018.

## 2018-05-29 NOTE — Progress Notes (Signed)
MRN : [id number]  Brianna Barnes is a 85 y.o. (1928-04-16) female who presents with chief complaint of  Chief Complaint  Patient presents with   Follow-up    ultrasound  .  History of Present Illness:   The patient presents to the office for evaluation of DVT.  DVT was identified at [facility] by Duplex ultrasound.  The initial symptoms were pain and swelling in the lower extremity.  The patient notes the leg continues to be very painful with dependency and swells quite a bite.  Symptoms are much better with elevation.  The patient notes minimal edema in the morning which steadily worsens throughout the day.    The patient has not been using compression therapy at this point.  No SOB or pleuritic chest pains.  No cough or hemoptysis.  No blood per rectum or blood in any sputum.  No excessive bruising per the patient.   Duplex ultrasound of the venous system is normal negative for acute or chronic DVT  Current Meds  Medication Sig   alendronate (FOSAMAX) 70 MG tablet TAKE 1 TABLET BY MOUTH ONCE A WEEK, AS DIRECTED   Calcium-Phosphorus-Vitamin D (CALCIUM GUMMIES PO) Take 1 tablet by mouth daily.   diclofenac sodium (VOLTAREN) 1 % GEL Apply 4 g topically 4 (four) times daily.   Iron Combinations (IRON COMPLEX PO) Take 1 tablet by mouth daily.   lidocaine (LIDODERM) 5 % Place 1 patch onto the skin daily. Remove & Discard patch within 12 hours or as directed by MD   pantoprazole (PROTONIX) 40 MG tablet Take 1 tablet (40 mg total) by mouth daily before breakfast.   sennosides-docusate sodium (SENOKOT-S) 8.6-50 MG tablet Take 1 tablet by mouth daily as needed for constipation.   simethicone (MYLICON) 423 MG chewable tablet Chew 125 mg by mouth every 6 (six) hours as needed for flatulence.   timolol (TIMOPTIC) 0.5 % ophthalmic solution Place 1 drop into both eyes daily.    traMADol (ULTRAM) 50 MG tablet TAKE ONE-HALF TO ONE TABLET BY MOUTH UP TO EVERY 12 HOURS AS NEEDED FOR  MODERATE PAIN.   traZODone (DESYREL) 150 MG tablet Take 1 tablet (150 mg total) by mouth at bedtime.   vitamin E 400 UNIT capsule Take 400 Units by mouth daily.   warfarin (COUMADIN) 2.5 MG tablet Take 1 tablet (2.5 mg total) by mouth daily.    Past Medical History:  Diagnosis Date   Choledocholithiasis    Cirrhosis, cryptogenic (HCC)    FOLLOWED BY DR PYRTLE-- LOV  IN EPIC--  STABLE PER NOTE   Cryptogenic cirrhosis (HCC)    Esophageal varix (HCC)    GERD (gastroesophageal reflux disease)    History of colonoscopy with polypectomy    ADENOMATOUS AND HYPERPLASIA POLYPS  (2008  &  2010)   History of hepatitis C    History of positive PPD    + TB SKIN TEST WITHOUT TB   Hyperlipidemia    Hypertension, portal (HCC)    SECONDARY TO CRYPTOGENIC CIRRHOSIS   Internal hemorrhoids    Iron deficiency anemia    Loss of hearing    right ear   Osteoarthritis    hands, knees, and low back   Osteoporosis    PUD (peptic ulcer disease)    Tubular adenoma of colon    Uterovaginal prolapse, incomplete    Wears glasses    Wears hearing aid    left ear only    Past Surgical History:  Procedure Laterality Date  APPENDECTOMY  1960'S   CHOLECYSTECTOMY  08/25/2011   Procedure: LAPAROSCOPIC CHOLECYSTECTOMY WITH INTRAOPERATIVE CHOLANGIOGRAM;  Surgeon: Zenovia Jarred, MD;  Location: [place];  Service: General;  Laterality: N/A;   ENDOSCOPIC RETROGRADE CHOLANGIOPANCREATOGRAPHY (ERCP) WITH PROPOFOL N/A 11/30/2012   Procedure: ENDOSCOPIC RETROGRADE CHOLANGIOPANCREATOGRAPHY (ERCP) WITH PROPOFOL;  Surgeon: Milus Banister, MD;  Location: [facility];  Service: Endoscopy;  Laterality: N/A;   ESOPHAGOGASTRODUODENOSCOPY N/A 05/18/2018   Procedure: ESOPHAGOGASTRODUODENOSCOPY (EGD);  Surgeon: Lin Landsman, MD;  Location: [facility] ENDOSCOPY;  Service: Gastroenterology;  Laterality: N/A;   EXTERNAL EAR SURGERY Left 1980   HYSTEROSCOPY W/D&C N/A 07/15/2014   Procedure: DILATATION AND  CURETTAGE /HYSTEROSCOPY;  Surgeon: Molli Posey, MD;  Location: [place];  Service: Gynecology;  Laterality: N/A;   INTRAMEDULLARY (IM) NAIL INTERTROCHANTERIC Left 01/22/2018   Procedure: ORIF LEFT INTERTROCHANTRIC HIP FX;  Surgeon: Mcarthur Rossetti, MD;  Location: [place];  Service: Orthopedics;  Laterality: Left;   KNEE ARTHROSCOPY W/ MENISCECTOMY Right 06-19-2002   LUMBAR DISC SURGERY  1990   REMOVAL OF URINARY SLING N/A 09/13/2013   Procedure: REMOVAL OF RETAINED PESSARY;  Surgeon: Jorja Loa, MD;  Location: [facility];  Service: Urology;  Laterality: N/A;    Social History Social History   Tobacco Use   Smoking status: Never Smoker   Smokeless tobacco: Never Used  Substance Use Topics   Alcohol use: No   Drug use: No    Family History Family History  Problem Relation Age of Onset   Heart disease Mother    Heart disease Father 83   Hyperlipidemia Father    Diabetes Father    Cirrhosis Sister    Diabetes Brother    Heart disease Brother     No Known Allergies   REVIEW OF SYSTEMS (Negative unless checked)  Constitutional: [] Weight loss  [] Fever  [] Chills Cardiac: [] Chest pain   [] Chest pressure   [] Palpitations   [] Shortness of breath when laying flat   [] Shortness of breath with exertion. Vascular:  [] Pain in legs with walking   [] Pain in legs at rest  [x] History of DVT   [] Phlebitis   [x] Swelling in legs   [] Varicose veins   [] Non-healing ulcers Pulmonary:   [] Uses home oxygen   [] Productive cough   [] Hemoptysis   [] Wheeze  [] COPD   [] Asthma Neurologic:  [] Dizziness   [] Seizures   [] History of stroke   [] History of TIA  [] Aphasia   [] Vissual changes   [] Weakness or numbness in arm   [] Weakness or numbness in leg Musculoskeletal:   [] Joint swelling   [] Joint pain   [] Low back pain Hematologic:  [] Easy bruising  [] Easy bleeding   [] Hypercoagulable state   [] Anemic Gastrointestinal:  [] Diarrhea   [] Vomiting   [] Gastroesophageal reflux/heartburn   [] Difficulty swallowing. Genitourinary:  [] Chronic kidney disease   [] Difficult urination  [] Frequent urination   [] Blood in urine Skin:  [] Rashes   [] Ulcers  Psychological:  [] History of anxiety   []  History of major depression.  Physical Examination  Vitals:   05/29/18 1516  BP: (!) 156/82  Pulse: 72  Resp: 16  Weight: 16 lb (7.258 kg)  Height: 4\' 10"  (1.473 m)   Body mass index is 3.34 kg/m. Gen: WD/WN, NAD Head: [place]/AT, No temporalis wasting.  Ear/Nose/Throat: Hearing grossly intact, nares w/o erythema or drainage Eyes: PER, EOMI, sclera nonicteric.  Neck: Supple, no large masses.   Pulmonary:  Good air movement, no audible wheezing bilaterally, no use of accessory muscles.  Cardiac: RRR, no JVD Vascular: trace edema bilaterally Vessel Right Left  Radial Palpable Palpable  PT Palpable Palpable  DP Palpable Palpable  Gastrointestinal: Non-distended. No guarding/no peritoneal signs.  Musculoskeletal: M/S 5/5 throughout.  No deformity or atrophy.  Neurologic: CN 2-12 intact. Symmetrical.  Speech is fluent. Motor exam as listed above. Psychiatric: Judgment intact, Mood & affect appropriate for pt's clinical situation. Dermatologic: No rashes or ulcers noted.  No changes consistent with cellulitis. Lymph : No lichenification or skin changes of chronic lymphedema.  CBC Lab Results  Component Value Date   WBC 3.6 (L) 05/20/2018   HGB 11.3 (L) 05/20/2018   HCT 34.5 (L) 05/20/2018   MCV 90.6 05/20/2018   PLT 128 (L) 05/20/2018    BMET    Component Value Date/Time   NA 140 05/16/2018 0400   K 3.8 05/16/2018 0400   CL 109 05/16/2018 0400   CO2 23 05/16/2018 0400   GLUCOSE 97 05/16/2018 0400   BUN 10 05/16/2018 0400   CREATININE 0.39 (L) 05/16/2018 0400   CALCIUM 7.6 (L) 05/16/2018 0400   GFRNONAA >60 05/16/2018 0400   GFRAA >60 05/16/2018 0400   Estimated Creatinine Clearance: 5.7 mL/min (A) (by C-G formula based on SCr of 0.39  mg/dL (L)).  COAG Lab Results  Component Value Date   INR 2.0 05/26/2018   INR 1.4 (A) 05/23/2018   INR 1.3 (H) 05/20/2018    Radiology Ct Abdomen Pelvis W Contrast  Result Date: 05/15/2018 CLINICAL DATA:  One episode of bloody emesis. Patient is on blood thinners. EXAM: CT ABDOMEN AND PELVIS WITH CONTRAST TECHNIQUE: Multidetector CT imaging of the abdomen and pelvis was performed using the standard protocol following bolus administration of intravenous contrast. CONTRAST:  64mL OMNIPAQUE IOHEXOL 300 MG/ML  SOLN COMPARISON:  MRI of the abdomen 06/24/2014 FINDINGS: Lower chest: Mild cardiomegaly without pericardial effusion. Coronary arteriosclerosis is noted. [place] off a geode varices in small hiatal hernia is identified. Hepatobiliary: Morphologic changes of cirrhosis with enlarged left hepatic lobe, recanalized umbilical vein but without intrahepatic mass or biliary dilatation. Cysts are noted of the right hepatic lobe. The patient is status post cholecystectomy. Pneumobilia likely from prior intervention is noted. Pancreas: No ductal dilatation, mass or inflammation. Spleen: The spleen measures 10.9 x 3.6 x 9.6 cm (volume = 200 cm^3). No splenic mass. Adrenals/Urinary Tract: Normal bilateral adrenal glands. No renal mass nor obstructive uropathy. No hydroureteronephrosis. The urinary bladder is unremarkable. Stomach/Bowel: Moderate-sized hiatal hernia. Physiologic distention of the stomach. The duodenal sweep and ligament of Treitz are normal. Scattered mild fluid distention of small bowel loops without mechanical obstruction. Moderate stool retention within the colon without inflammation. Scattered colonic diverticulosis without acute diverticulitis is identified. Normal-appearing appendix. Vascular/Lymphatic: Aortoiliac and branch vessel atherosclerosis. No aneurysm. Extensive paraesophageal and gastric varices. Recanalized umbilical vein. Reproductive: Hysterectomy. No adnexal mass. Cerclage device  in place. Other: No free air nor ascites. Musculoskeletal: Left femoral nail fixation. Degenerative disc disease L4-5. IMPRESSION: 1. Morphologic changes of cirrhosis with stigmata of portal hypertension including paraesophageal and gastric varices and recannulized umbilical vein. No evidence of active hemorrhage. 2. Status post cholecystectomy. Pneumobilia likely from prior intervention. Stable right-sided hepatic cysts. 3. Aortoiliac and branch vessel atherosclerosis. 4. Colonic diverticulosis without acute diverticulitis. 5. Degenerative disc disease L4-5. Left femoral nail fixation. Electronically Signed   By: Ashley Royalty M.D.   On: 05/15/2018 23:34   US Venous Img Lower Bilateral  Addendum Date: 05/19/2018   ADDENDUM REPORT: 05/19/2018 13:59 ADDENDUM: The original report  was dictated as a unilateral study. The study is now corrected as a bilateral examination. CLINICAL DATA:  History of DVT EXAM: BILATERAL LOWER EXTREMITY VENOUS DUPLEX ULTRASOUND TECHNIQUE: Doppler venous assessment of the bilateral lower extremity deep venous system was performed, including characterization of spectral flow, compressibility, and phasicity. COMPARISON:  02/27/2018 FINDINGS: There is complete compressibility of the right common femoral and femoral veins. The popliteal vein is partially compressible. There is partially occlusive thrombus within the popliteal vein. This is characterized by hypoechoic signal filling the popliteal vein with a channel of flow traversing through the thrombus. This has a subacute to chronic appearance. There is no obvious DVT in the calf. There is no superficial thrombosis. Doppler analysis demonstrates phasicity with augmentation on calf compression. There is complete compressibility of the left common femoral, femoral, and popliteal veins. Doppler analysis demonstrates respiratory phasicity and augmentation of flow with calf compression. No obvious superficial vein or calf vein thrombosis.  IMPRESSION: The study is positive for partially occlusive DVT in the right popliteal vein. It has a subacute to chronic appearance. No evidence of left lower extremity DVT. Electronically Signed   By: Marybelle Killings M.D.   On: 05/19/2018 13:59   Result Date: 05/19/2018 CLINICAL DATA:  History of DVT EXAM: RIGHT LOWER EXTREMITY VENOUS DUPLEX ULTRASOUND TECHNIQUE: Doppler venous assessment of the right lower extremity deep venous system was performed, including characterization of spectral flow, compressibility, and phasicity. COMPARISON:  None. FINDINGS: There is complete compressibility of the right common femoral and femoral veins. The popliteal vein is partially compressible. There is partially occlusive thrombus within the popliteal vein. This is characterized by hypoechoic signal filling the popliteal vein with a channel of flow traversing through the thrombus. This has a subacute to chronic appearance. There is no obvious DVT in the calf. There is no superficial thrombosis. Doppler analysis demonstrates phasicity with augmentation on calf compression. IMPRESSION: The study is positive for partially occlusive DVT in the right popliteal vein. It has a subacute to chronic appearance. Electronically Signed: By: Marybelle Killings M.D. On: 05/18/2018 16:45      Assessment/Plan 1. Iron deficiency anemia, unspecified iron deficiency anemia type I will check a CBC before her follow up  - CBC  2. Deep vein thrombosis (DVT) of lower extremity, unspecified chronicity, unspecified laterality, unspecified vein (HCC) Recommend:   No surgery or intervention at this point in time.  IVC filter is not indicated at present.  Patient's duplex ultrasound of the venous system shows DVT from the popliteal to the femoral veins.  The patient is initiated on anticoagulation   Elevation was stressed, use of a recliner was discussed.  I have had a long discussion with the patient regarding DVT and post phlebitic changes  such as swelling and why it  causes symptoms such as pain.  The patient will wear graduated compression stockings class 1 (20-30 mmHg), beginning after three full days of anticoagulation, on a daily basis a prescription was given. The patient will  beginning wearing the stockings first thing in the morning and removing them in the evening. The patient is instructed specifically not to sleep in the stockings.  In addition, behavioral modification including elevation during the day and avoidance of prolonged dependency will be initiated.    The patient will continue anticoagulation for now as there have not been any problems or complications at this point.    Her INR has only been therapeutic since Friday  I don't feel that Coumadin has any advantages and  the patient is requesting to restart Eliquis Given her Ultrasound I would have her stop Coumadin wait two days and begin Eliquis at 2.5 mg BID  3. Gastroesophageal reflux disease without esophagitis Continue PPI as already ordered, this medication has been reviewed and there are no changes at this time.  Avoidence of caffeine and alcohol  Moderate elevation of the head of the bed   4. Primary osteoarthritis involving multiple joints Continue NSAID medications as already ordered, these medications have been reviewed and there are no changes at this time.  Continued activity and therapy was stressed.   5. Mixed hyperlipidemia Continue statin as ordered and reviewed, no changes at this time     Hortencia Pilar, MD  05/29/2018 8:58 PM

## 2018-05-29 NOTE — Telephone Encounter (Signed)
Brianna Barnes @ [facility] called best number [phone]  Needs orders Skilled nursing 1 week 1 2 week 2 1 week 3  PT/INR on 3/20 current 2.5 coumadin  Dosage  Please advise dosage after pt/inr done on 3/20

## 2018-05-29 NOTE — Telephone Encounter (Signed)
Tried calling Brianna Barnes to let her know paperwork is ready for pick up Voicemail full  Copy for pt Copy for file Copy for scan

## 2018-05-30 NOTE — Telephone Encounter (Signed)
Tried calling Brianna Barnes  Mail box full

## 2018-05-31 ENCOUNTER — Telehealth: Payer: Self-pay

## 2018-05-31 NOTE — Telephone Encounter (Signed)
Tried calling Brianna Barnes mail box full

## 2018-05-31 NOTE — Telephone Encounter (Signed)
Patient was seen on 3/16 by vascular specialist who determined that coumadin was not advantageous to patient and has placed her back on Eliquis.  INR clinic appointment has been cancelled for 3/19.

## 2018-06-01 ENCOUNTER — Ambulatory Visit: Payer: Medicare Other

## 2018-06-05 ENCOUNTER — Other Ambulatory Visit: Payer: Self-pay | Admitting: Family Medicine

## 2018-06-05 NOTE — Telephone Encounter (Signed)
Tried calling cathy mail box full

## 2018-06-06 ENCOUNTER — Telehealth: Payer: Self-pay

## 2018-06-06 DIAGNOSIS — M170 Bilateral primary osteoarthritis of knee: Secondary | ICD-10-CM

## 2018-06-06 DIAGNOSIS — K7469 Other cirrhosis of liver: Secondary | ICD-10-CM | POA: Diagnosis not present

## 2018-06-06 DIAGNOSIS — I10 Essential (primary) hypertension: Secondary | ICD-10-CM | POA: Diagnosis not present

## 2018-06-06 DIAGNOSIS — I82401 Acute embolism and thrombosis of unspecified deep veins of right lower extremity: Secondary | ICD-10-CM | POA: Diagnosis not present

## 2018-06-06 DIAGNOSIS — I8500 Esophageal varices without bleeding: Secondary | ICD-10-CM | POA: Diagnosis not present

## 2018-06-06 NOTE — Telephone Encounter (Signed)
Chrissie Noa, nurse with [facility], l/m stating patient cancelled Home health visits due to COVID 19 fear until further notice. If we have any questions for Lattie Haw her CB is [phone].

## 2018-06-08 ENCOUNTER — Ambulatory Visit: Payer: Medicare Other

## 2018-06-12 ENCOUNTER — Ambulatory Visit (INDEPENDENT_AMBULATORY_CARE_PROVIDER_SITE_OTHER): Payer: Medicare Other | Admitting: Vascular Surgery

## 2018-06-12 ENCOUNTER — Telehealth (INDEPENDENT_AMBULATORY_CARE_PROVIDER_SITE_OTHER): Payer: Self-pay | Admitting: Vascular Surgery

## 2018-06-12 NOTE — Telephone Encounter (Signed)
Discussed how patient was doing.  Her leg feels fine no significant swelling and it is not hurting her.  Currently she is taking Eliquis 2.5 mg p.o. twice daily and doing quite well with this.  We will follow-up with her in late June repeat her ultrasound and discuss cessation of anticoagulation at that time.  Patient's DVT was discovered after hip surgery in December.

## 2018-06-16 ENCOUNTER — Encounter: Payer: Medicare Other | Admitting: Family Medicine

## 2018-07-05 ENCOUNTER — Encounter: Payer: Self-pay | Admitting: *Deleted

## 2018-07-06 ENCOUNTER — Other Ambulatory Visit: Payer: Self-pay

## 2018-07-06 ENCOUNTER — Ambulatory Visit (INDEPENDENT_AMBULATORY_CARE_PROVIDER_SITE_OTHER): Payer: Medicare Other | Admitting: Internal Medicine

## 2018-07-06 ENCOUNTER — Encounter: Payer: Self-pay | Admitting: Internal Medicine

## 2018-07-06 VITALS — Ht <= 58 in | Wt 107.0 lb

## 2018-07-06 DIAGNOSIS — K31811 Angiodysplasia of stomach and duodenum with bleeding: Secondary | ICD-10-CM | POA: Diagnosis not present

## 2018-07-06 DIAGNOSIS — D500 Iron deficiency anemia secondary to blood loss (chronic): Secondary | ICD-10-CM | POA: Diagnosis not present

## 2018-07-06 DIAGNOSIS — K7469 Other cirrhosis of liver: Secondary | ICD-10-CM | POA: Diagnosis not present

## 2018-07-06 DIAGNOSIS — K5900 Constipation, unspecified: Secondary | ICD-10-CM

## 2018-07-06 DIAGNOSIS — K766 Portal hypertension: Secondary | ICD-10-CM | POA: Diagnosis not present

## 2018-07-06 NOTE — Progress Notes (Signed)
Subjective:    Patient ID: Brianna Barnes, female    DOB: [birth year], 85 y.o.   MRN: [id number]  This service was provided via telemedicine.  Telephone encounter, audio only The patient was located at home The provider was located in [place] office The patient did consent to this telephone visit and is aware of possible charges through their insurance for this visit.   The patient was referred by Eliezer Lofts, MD The other persons participating in this telemedicine service were the patient, her daughter Neoma Laming and I. Time spent on call: 20 min   HPI Brianna Barnes is an 85 year old female with a history of cryptogenic cirrhosis with portal hypertension, history of CBD stone status post ERCP, GERD, history of DVT currently on Eliquis, arthritis and recent hospitalization due to hematemesis found to have duodenal angiodysplastic lesions who is seen by telephone visit for follow-up in the setting of COVID-19.  Mrs. Huot has had cryptogenic cirrhosis now for multiple years but has not seen me since February 2017.  At the time of her last visit she was doing well from a cirrhosis standpoint.  She did not have any evidence at that time of decompensated liver disease.  In early March she had hematemesis at home in the setting of Eliquis and she was hospitalized at [facility] from March 2 to May 20, 2018.  At that time she was found to have a mild acute on chronic anemia but did not require blood transfusion.  She underwent upper endoscopy at [facility] which showed 4 duodenal angioectasias which were ablated with APC.  Fortunately her esophagus was normal and there was no evidence of esophagus or gastric varices.  This examined stomach was normal.  She was discharged on pantoprazole 40 mg and oral iron daily.  She was also given Senokot for iron induced constipation.  Her anticoagulation has varied initially she was on Eliquis at the time of hospitalization this was changed to warfarin due to persistent deep  vein thrombosis found by ultrasound.  This was then later changed back to Eliquis which she is currently taking.  Fortunately she has had no further hematemesis.  No abdominal pain.  No increasing abdominal girth or lower extremity edema.  No confusion.  No jaundice.  She continues to have issues with constipation and this is despite senna.  She has had no evidence of melena though stools have been dark on oral iron and no further hematemesis.  Family history notable for cirrhosis in 1 of her brothers and 1 of her sisters.  A CT scan of the abdomen pelvis with contrast was performed during that hospitalization on 05/15/2018 and showed morphologic changes of cirrhosis with stigmata of portal hypertension including paraesophageal and gastric varices with a recanalized umbilical vein.  There was pneumobilia from prior intervention.  Stable right sided hepatic cysts.  No concerning liver lesions.  There was atherosclerosis, colonic diverticulosis and degenerative disc disease at L4 and 5.  Review of Systems As per HPI, otherwise negative  Current Medications, Allergies, Past Medical History, Past Surgical History, Family History and Social History were reviewed in [employer] record.     Objective:   Physical Exam No labs, virtual visit  CBC    Component Value Date/Time   WBC 3.6 (L) 05/20/2018 0534   RBC 3.81 (L) 05/20/2018 0534   HGB 11.3 (L) 05/20/2018 0534   HGB 12.8 05/06/2015 1003   HCT 34.5 (L) 05/20/2018 0534   HCT 38.1 05/06/2015 1003  PLT 128 (L) 05/20/2018 0534   MCV 90.6 05/20/2018 0534   MCV 89 05/06/2015 1003   MCH 29.7 05/20/2018 0534   MCHC 32.8 05/20/2018 0534   RDW 13.7 05/20/2018 0534   RDW 14.1 05/06/2015 1003   LYMPHSABS 0.9 01/21/2018 2047   LYMPHSABS 1.0 05/06/2015 1003   MONOABS 0.6 01/21/2018 2047   EOSABS 0.3 01/21/2018 2047   EOSABS 0.2 05/06/2015 1003   BASOSABS 0.0 01/21/2018 2047   BASOSABS 0.0 05/06/2015 1003   CMP      Component Value Date/Time   NA 140 05/16/2018 0400   K 3.8 05/16/2018 0400   CL 109 05/16/2018 0400   CO2 23 05/16/2018 0400   GLUCOSE 97 05/16/2018 0400   BUN 10 05/16/2018 0400   CREATININE 0.39 (L) 05/16/2018 0400   CALCIUM 7.6 (L) 05/16/2018 0400   PROT 5.5 (L) 05/16/2018 0400   ALBUMIN 3.3 (L) 05/16/2018 0400   AST 33 05/16/2018 0400   ALT 20 05/16/2018 0400   ALKPHOS 75 05/16/2018 0400   BILITOT 0.3 05/16/2018 0400   GFRNONAA >60 05/16/2018 0400   GFRAA >60 05/16/2018 0400   Lab Results  Component Value Date   INR 2.0 05/26/2018   INR 1.4 (A) 05/23/2018   INR 1.3 (H) 05/20/2018   CT ABDOMEN AND PELVIS WITH CONTRAST   TECHNIQUE: Multidetector CT imaging of the abdomen and pelvis was performed using the standard protocol following bolus administration of intravenous contrast.   CONTRAST:  9mL OMNIPAQUE IOHEXOL 300 MG/ML  SOLN   COMPARISON:  MRI of the abdomen 06/24/2014   FINDINGS: Lower chest: Mild cardiomegaly without pericardial effusion. Coronary arteriosclerosis is noted. [place] off a geode varices in small hiatal hernia is identified.   Hepatobiliary: Morphologic changes of cirrhosis with enlarged left hepatic lobe, recanalized umbilical vein but without intrahepatic mass or biliary dilatation. Cysts are noted of the right hepatic lobe. The patient is status post cholecystectomy. Pneumobilia likely from prior intervention is noted.   Pancreas: No ductal dilatation, mass or inflammation.   Spleen: The spleen measures 10.9 x 3.6 x 9.6 cm (volume = 200 cm^3). No splenic mass.   Adrenals/Urinary Tract: Normal bilateral adrenal glands. No renal mass nor obstructive uropathy. No hydroureteronephrosis. The urinary bladder is unremarkable.   Stomach/Bowel: Moderate-sized hiatal hernia. Physiologic distention of the stomach. The duodenal sweep and ligament of Treitz are normal. Scattered mild fluid distention of small bowel loops without mechanical  obstruction. Moderate stool retention within the colon without inflammation. Scattered colonic diverticulosis without acute diverticulitis is identified. Normal-appearing appendix.   Vascular/Lymphatic: Aortoiliac and branch vessel atherosclerosis. No aneurysm. Extensive paraesophageal and gastric varices. Recanalized umbilical vein.   Reproductive: Hysterectomy. No adnexal mass. Cerclage device in place.   Other: No free air nor ascites.   Musculoskeletal: Left femoral nail fixation. Degenerative disc disease L4-5.   IMPRESSION: 1. Morphologic changes of cirrhosis with stigmata of portal hypertension including paraesophageal and gastric varices and recannulized umbilical vein. No evidence of active hemorrhage. 2. Status post cholecystectomy. Pneumobilia likely from prior intervention. Stable right-sided hepatic cysts. 3. Aortoiliac and branch vessel atherosclerosis. 4. Colonic diverticulosis without acute diverticulitis. 5. Degenerative disc disease L4-5. Left femoral nail fixation.     Electronically Signed   By: Ashley Royalty M.D.   On: 05/15/2018 23:34       Assessment & Plan:   85 year old female with a history of cryptogenic cirrhosis with portal hypertension, history of CBD stone status post ERCP, GERD, history of DVT currently on Eliquis,  arthritis and recent hospitalization due to hematemesis found to have duodenal angiodysplastic lesions who is seen by telephone visit for follow-up in the setting of COVID-19.  1.  Cryptogenic cirrhosis with portal hypertension --fortunately she does not have any evidence of decompensated liver disease.  She did not have esophageal varices endoscopically despite suggested by cross-sectional imaging.  She has not had issues with ascites, jaundice or encephalopathy.  She has a mild thrombocytopenia but platelets are greater than 100,000. --We again reviewed cirrhosis management today and she does wish to follow-up with me routinely. --No  issues with ascites or lower extremity edema --HCC screening now up-to-date with CT scan performed in March 2020, consider ultrasound in March 2021 --No esophageal varices at endoscopy in March 2020  2.  Duodenal angiodysplastic lesions --ablated by APC.  We discussed how these lesions can lead to a chronic iron deficiency anemia and GI bleeding.  Particularly in patients on anticoagulation such as herself.  I recommended that we watch her blood counts and iron studies closely to ensure that they are stable particularly while she remains on Eliquis.  I also informed her that there is a high probability that additional angiodysplastic lesions exist in the more distal small bowel but that we would not do anything immediately for these endoscopically unless there was active bleeding or she was having issues with worsening anemia. --I would like her to come for CBC and iron studies in the next several days to the  lab --Continue pantoprazole 40 mg once daily --Continue oral iron  3.  Chronic constipation --exacerbated by oral iron and senna is incompletely effective.  Begin MiraLAX 17 g daily and can use senna as a backup per bottle instruction  Office visit with me in about 3 months

## 2018-07-09 ENCOUNTER — Other Ambulatory Visit: Payer: Self-pay

## 2018-07-10 ENCOUNTER — Other Ambulatory Visit: Payer: Self-pay

## 2018-07-10 ENCOUNTER — Ambulatory Visit (INDEPENDENT_AMBULATORY_CARE_PROVIDER_SITE_OTHER): Payer: Medicare Other | Admitting: Vascular Surgery

## 2018-07-10 ENCOUNTER — Other Ambulatory Visit (INDEPENDENT_AMBULATORY_CARE_PROVIDER_SITE_OTHER): Payer: Medicare Other

## 2018-07-10 ENCOUNTER — Encounter (INDEPENDENT_AMBULATORY_CARE_PROVIDER_SITE_OTHER): Payer: Medicare Other

## 2018-07-10 DIAGNOSIS — K7469 Other cirrhosis of liver: Secondary | ICD-10-CM | POA: Diagnosis not present

## 2018-07-10 DIAGNOSIS — D500 Iron deficiency anemia secondary to blood loss (chronic): Secondary | ICD-10-CM | POA: Diagnosis not present

## 2018-07-10 LAB — CBC WITH DIFFERENTIAL/PLATELET
Basophils Absolute: 0 10*3/uL (ref 0.0–0.1)
Basophils Relative: 0.5 % (ref 0.0–3.0)
Eosinophils Absolute: 0.1 10*3/uL (ref 0.0–0.7)
Eosinophils Relative: 2.2 % (ref 0.0–5.0)
HCT: 37.3 % (ref 36.0–46.0)
Hemoglobin: 12.7 g/dL (ref 12.0–15.0)
Lymphocytes Relative: 22.1 % (ref 12.0–46.0)
Lymphs Abs: 0.8 10*3/uL (ref 0.7–4.0)
MCHC: 34.1 g/dL (ref 30.0–36.0)
MCV: 92.5 fl (ref 78.0–100.0)
Monocytes Absolute: 0.4 10*3/uL (ref 0.1–1.0)
Monocytes Relative: 9.9 % (ref 3.0–12.0)
Neutro Abs: 2.4 10*3/uL (ref 1.4–7.7)
Neutrophils Relative %: 65.3 % (ref 43.0–77.0)
Platelets: 149 10*3/uL — ABNORMAL LOW (ref 150.0–400.0)
RBC: 4.03 Mil/uL (ref 3.87–5.11)
RDW: 14.2 % (ref 11.5–15.5)
WBC: 3.7 10*3/uL — ABNORMAL LOW (ref 4.0–10.5)

## 2018-07-10 LAB — IBC + FERRITIN
Ferritin: 55.1 ng/mL (ref 10.0–291.0)
Iron: 109 ug/dL (ref 42–145)
Saturation Ratios: 36.2 % (ref 20.0–50.0)
Transferrin: 215 mg/dL (ref 212.0–360.0)

## 2018-07-10 MED ORDER — ALENDRONATE SODIUM 70 MG PO TABS: 70.0000 mg | ORAL_TABLET | ORAL | 0 refills | Status: DC

## 2018-07-10 NOTE — Progress Notes (Signed)
Lab called. Pt was downstairs saying Dr. Hilarie Fredrickson told her to go to the lab but no orders were in.  Per OV on 4-23, CBC and IBCF lab order entered.

## 2018-08-22 ENCOUNTER — Other Ambulatory Visit: Payer: Self-pay | Admitting: Family Medicine

## 2018-08-25 DIAGNOSIS — H401132 Primary open-angle glaucoma, bilateral, moderate stage: Secondary | ICD-10-CM | POA: Diagnosis not present

## 2018-08-31 ENCOUNTER — Other Ambulatory Visit (INDEPENDENT_AMBULATORY_CARE_PROVIDER_SITE_OTHER): Payer: Self-pay | Admitting: Vascular Surgery

## 2018-08-31 DIAGNOSIS — I82409 Acute embolism and thrombosis of unspecified deep veins of unspecified lower extremity: Secondary | ICD-10-CM

## 2018-09-07 ENCOUNTER — Other Ambulatory Visit: Payer: Self-pay

## 2018-09-07 ENCOUNTER — Encounter (INDEPENDENT_AMBULATORY_CARE_PROVIDER_SITE_OTHER): Payer: Self-pay | Admitting: Vascular Surgery

## 2018-09-07 ENCOUNTER — Ambulatory Visit (INDEPENDENT_AMBULATORY_CARE_PROVIDER_SITE_OTHER): Payer: Medicare Other | Admitting: Vascular Surgery

## 2018-09-07 ENCOUNTER — Ambulatory Visit (INDEPENDENT_AMBULATORY_CARE_PROVIDER_SITE_OTHER): Payer: Medicare Other

## 2018-09-07 VITALS — BP 162/80 | HR 75 | Resp 12 | Ht <= 58 in | Wt 113.0 lb

## 2018-09-07 DIAGNOSIS — I82409 Acute embolism and thrombosis of unspecified deep veins of unspecified lower extremity: Secondary | ICD-10-CM

## 2018-09-07 DIAGNOSIS — K219 Gastro-esophageal reflux disease without esophagitis: Secondary | ICD-10-CM | POA: Diagnosis not present

## 2018-09-07 DIAGNOSIS — Z791 Long term (current) use of non-steroidal anti-inflammatories (NSAID): Secondary | ICD-10-CM

## 2018-09-07 DIAGNOSIS — L814 Other melanin hyperpigmentation: Secondary | ICD-10-CM | POA: Diagnosis not present

## 2018-09-07 DIAGNOSIS — L821 Other seborrheic keratosis: Secondary | ICD-10-CM | POA: Diagnosis not present

## 2018-09-07 DIAGNOSIS — M150 Primary generalized (osteo)arthritis: Secondary | ICD-10-CM | POA: Diagnosis not present

## 2018-09-07 DIAGNOSIS — Z1283 Encounter for screening for malignant neoplasm of skin: Secondary | ICD-10-CM | POA: Diagnosis not present

## 2018-09-07 DIAGNOSIS — Z85828 Personal history of other malignant neoplasm of skin: Secondary | ICD-10-CM | POA: Diagnosis not present

## 2018-09-07 DIAGNOSIS — Z7901 Long term (current) use of anticoagulants: Secondary | ICD-10-CM

## 2018-09-07 DIAGNOSIS — M159 Polyosteoarthritis, unspecified: Secondary | ICD-10-CM

## 2018-09-07 DIAGNOSIS — D1800 Hemangioma unspecified site: Secondary | ICD-10-CM | POA: Diagnosis not present

## 2018-09-07 DIAGNOSIS — Z86718 Personal history of other venous thrombosis and embolism: Secondary | ICD-10-CM

## 2018-09-07 DIAGNOSIS — E782 Mixed hyperlipidemia: Secondary | ICD-10-CM | POA: Diagnosis not present

## 2018-09-07 DIAGNOSIS — Z79899 Other long term (current) drug therapy: Secondary | ICD-10-CM

## 2018-09-07 DIAGNOSIS — L918 Other hypertrophic disorders of the skin: Secondary | ICD-10-CM | POA: Diagnosis not present

## 2018-09-07 NOTE — Progress Notes (Signed)
MRN : [id number]  Brianna Barnes is a 85 y.o. (01-Aug-1928) female who presents with chief complaint of  Chief Complaint  Patient presents with  . Follow-up  .  History of Present Illness:   The patient presents to the office for evaluation of DVT.  DVT was identified at [facility] by Duplex ultrasound.  The initial symptoms were pain and swelling in the lower extremity.  She has been tolerating Eliquis 2.5 mg po bid  The patient notes the leg continues to be very painful with dependency and swells quite a bite.  Symptoms are much better with elevation.  The patient notes minimal edema in the morning which steadily worsens throughout the day.    The patient has not been using compression therapy at this point.  No SOB or pleuritic chest pains.  No cough or hemoptysis.  No blood per rectum or blood in any sputum.  No excessive bruising per the patient.   Duplex ultrasound of the venous system is normal negative for acute or chronic DVT  Current Meds  Medication Sig  . alendronate (FOSAMAX) 70 MG tablet Take 1 tablet (70 mg total) by mouth once a week. Take with a full glass of water on an empty stomach.  Marland Kitchen apixaban (ELIQUIS) 2.5 MG TABS tablet Take 2.5 mg by mouth 2 (two) times daily.  . Calcium-Phosphorus-Vitamin D (CALCIUM GUMMIES PO) Take 1 tablet by mouth daily.  . diclofenac sodium (VOLTAREN) 1 % GEL Apply 4 g topically 4 (four) times daily. (Patient taking differently: Apply 4 g topically 4 (four) times daily as needed. )  . Iron Combinations (IRON COMPLEX PO) Take 1 tablet by mouth daily.  Marland Kitchen lidocaine (LIDODERM) 5 % Place 1 patch onto the skin daily. Remove & Discard patch within 12 hours or as directed by MD  . pantoprazole (PROTONIX) 40 MG tablet Take 1 tablet (40 mg total) by mouth daily before breakfast.  . sennosides-docusate sodium (SENOKOT-S) 8.6-50 MG tablet Take 1 tablet by mouth daily as needed for constipation.  . simethicone (MYLICON) 412 MG chewable  tablet Chew 125 mg by mouth every 6 (six) hours as needed for flatulence.  . timolol (TIMOPTIC) 0.5 % ophthalmic solution Place 1 drop into both eyes daily.   . traMADol (ULTRAM) 50 MG tablet TAKE ONE-HALF TO ONE TABLET BY MOUTH UP TO EVERY 12 HOURS AS NEEDED FOR MODERATE PAIN.  . traZODone (DESYREL) 150 MG tablet TAKE 1 TABLET BY MOUTH EVERYDAY AT BEDTIME  . vitamin E 400 UNIT capsule Take 400 Units by mouth daily.    Past Medical History:  Diagnosis Date  . Arthritis   . Choledocholithiasis   . Cirrhosis, cryptogenic (HCC)    FOLLOWED BY DR PYRTLE-- LOV  IN EPIC--  STABLE PER NOTE  . Common bile duct stone   . Cryptogenic cirrhosis ([place])   . DDD (degenerative disc disease), lumbar   . Diverticulosis   . Esophageal varix ([place])   . GERD (gastroesophageal reflux disease)   . Hiatal hernia   . History of colonoscopy with polypectomy    ADENOMATOUS AND HYPERPLASIA POLYPS  (2008  &  2010)  . History of hepatitis C   . History of positive PPD    + TB SKIN TEST WITHOUT TB  . Hyperlipidemia   . Hypertension, portal ([place])    SECONDARY TO CRYPTOGENIC CIRRHOSIS  . Internal hemorrhoids   . Iron deficiency anemia   . Loss of hearing    right ear  .  Osteoarthritis    hands, knees, and low back  . Osteoporosis   . Portal hypertension ([place])   . PUD (peptic ulcer disease)   . Tubular adenoma of colon   . Uterovaginal prolapse, incomplete   . Wears glasses   . Wears hearing aid    left ear only    Past Surgical History:  Procedure Laterality Date  . APPENDECTOMY  1960'S  . CHOLECYSTECTOMY  08/25/2011   Procedure: LAPAROSCOPIC CHOLECYSTECTOMY WITH INTRAOPERATIVE CHOLANGIOGRAM;  Surgeon: Zenovia Jarred, MD;  Location: [facility] [place];  Service: General;  Laterality: N/A;  . ENDOSCOPIC RETROGRADE CHOLANGIOPANCREATOGRAPHY (ERCP) WITH PROPOFOL N/A 11/30/2012   Procedure: ENDOSCOPIC RETROGRADE CHOLANGIOPANCREATOGRAPHY (ERCP) WITH PROPOFOL;  Surgeon: Milus Banister, MD;  Location: [facility];   Service: Endoscopy;  Laterality: N/A;  . ESOPHAGOGASTRODUODENOSCOPY N/A 05/18/2018   Procedure: ESOPHAGOGASTRODUODENOSCOPY (EGD);  Surgeon: Lin Landsman, MD;  Location: [facility];  Service: Gastroenterology;  Laterality: N/A;  . EXTERNAL EAR SURGERY Left 1980  . HYSTEROSCOPY W/D&C N/A 07/15/2014   Procedure: DILATATION AND CURETTAGE /HYSTEROSCOPY;  Surgeon: Molli Posey, MD;  Location: [facility];  Service: Gynecology;  Laterality: N/A;  . INTRAMEDULLARY (IM) NAIL INTERTROCHANTERIC Left 01/22/2018   Procedure: ORIF LEFT INTERTROCHANTRIC HIP FX;  Surgeon: Mcarthur Rossetti, MD;  Location: [place];  Service: Orthopedics;  Laterality: Left;  . KNEE ARTHROSCOPY W/ MENISCECTOMY Right 06-19-2002  . [place] SURGERY  1990  . REMOVAL OF URINARY SLING N/A 09/13/2013   Procedure: REMOVAL OF RETAINED PESSARY;  Surgeon: Jorja Loa, MD;  Location: [facility];  Service: Urology;  Laterality: N/A;    Social History Social History   Tobacco Use  . Smoking status: Never Smoker  . Smokeless tobacco: Never Used  Substance Use Topics  . Alcohol use: No  . Drug use: No    Family History Family History  Problem Relation Age of Onset  . Heart disease Mother   . Heart disease Father 46  . Hyperlipidemia Father   . Diabetes Father   . Cirrhosis Sister   . Diabetes Brother   . Heart disease Brother   . Cirrhosis Brother   . Pancreatic cancer Brother   . Colon cancer Neg Hx   . Colon polyps Neg Hx   . Esophageal cancer Neg Hx   . Stomach cancer Neg Hx   . Inflammatory bowel disease Neg Hx     No Known Allergies   REVIEW OF SYSTEMS (Negative unless checked)  Constitutional: [] Weight loss  [] Fever  [] Chills Cardiac: [] Chest pain   [] Chest pressure   [] Palpitations   [] Shortness of breath when laying flat   [] Shortness of breath with exertion. Vascular:  [] Pain in legs with walking   [] Pain in legs at rest  [x] History of DVT   [] Phlebitis    [] Swelling in legs   [] Varicose veins   [] Non-healing ulcers Pulmonary:   [] Uses home oxygen   [] Productive cough   [] Hemoptysis   [] Wheeze  [] COPD   [] Asthma Neurologic:  [] Dizziness   [] Seizures   [] History of stroke   [] History of TIA  [] Aphasia   [] Vissual changes   [] Weakness or numbness in arm   [] Weakness or numbness in leg Musculoskeletal:   [] Joint swelling   [] Joint pain   [] Low back pain Hematologic:  [] Easy bruising  [] Easy bleeding   [] Hypercoagulable state   [] Anemic Gastrointestinal:  [] Diarrhea   [] Vomiting  [] Gastroesophageal reflux/heartburn   [] Difficulty swallowing. Genitourinary:  [] Chronic kidney disease   []   Difficult urination  [] Frequent urination   [] Blood in urine Skin:  [] Rashes   [] Ulcers  Psychological:  [] History of anxiety   []  History of major depression.  Physical Examination  Vitals:   09/07/18 1434  BP: (!) 162/80  Pulse: 75  Resp: 12  Weight: 113 lb (51.3 kg)  Height: 4\' 10"  (1.473 m)   Body mass index is 23.62 kg/m. Gen: WD/WN, NAD Head: [place]/AT, No temporalis wasting.  Ear/Nose/Throat: Hearing grossly intact, nares w/o erythema or drainage Eyes: PER, EOMI, sclera nonicteric.  Neck: Supple, no large masses.   Pulmonary:  Good air movement, no audible wheezing bilaterally, no use of accessory muscles.  Cardiac: RRR, no JVD Vascular: scattered varicosities present bilaterally.  Mild venous stasis changes to the legs bilaterally.  1+ soft pitting edema Vessel Right Left  Radial Palpable Palpable  PT Palpable Palpable  DP Palpable Palpable  Gastrointestinal: Non-distended. No guarding/no peritoneal signs.  Musculoskeletal: M/S 5/5 throughout.  No deformity or atrophy.  Neurologic: CN 2-12 intact. Symmetrical.  Speech is fluent. Motor exam as listed above. Psychiatric: Judgment intact, Mood & affect appropriate for pt's clinical situation. Dermatologic: Venous rashes no ulcers noted.  No changes consistent with cellulitis. Lymph : No  lichenification or skin changes of chronic lymphedema.  CBC Lab Results  Component Value Date   WBC 3.7 (L) 07/10/2018   HGB 12.7 07/10/2018   HCT 37.3 07/10/2018   MCV 92.5 07/10/2018   PLT 149.0 (L) 07/10/2018    BMET    Component Value Date/Time   NA 140 05/16/2018 0400   K 3.8 05/16/2018 0400   CL 109 05/16/2018 0400   CO2 23 05/16/2018 0400   GLUCOSE 97 05/16/2018 0400   BUN 10 05/16/2018 0400   CREATININE 0.39 (L) 05/16/2018 0400   CALCIUM 7.6 (L) 05/16/2018 0400   GFRNONAA >60 05/16/2018 0400   GFRAA >60 05/16/2018 0400   CrCl cannot be calculated (Patient's most recent lab result is older than the maximum 21 days allowed.).  COAG Lab Results  Component Value Date   INR 2.0 05/26/2018   INR 1.4 (A) 05/23/2018   INR 1.3 (H) 05/20/2018    Radiology No results found.   Assessment/Plan 1. Deep vein thrombosis (DVT) of lower extremity, unspecified chronicity, unspecified laterality, unspecified vein (HCC) Recommend:  The patient is complaining of varicose veins.    I have had a long discussion with the patient regarding  varicose veins and why they cause symptoms.  Patient will begin wearing graduated compression stockings on a daily basis, beginning first thing in the morning and removing them in the evening. The patient is instructed specifically not to sleep in the stockings.    The patient  will also begin using over-the-counter analgesics such as Motrin 600 mg po TID to help control the symptoms as needed.    In addition, behavioral modification including elevation during the day will be initiated, utilizing a recliner was recommended.  The patient is also instructed to continue exercising such as walking 4-5 times per week.  At this time the patient wishes to continue conservative therapy and is not interested in more invasive treatments such as laser ablation and sclerotherapy.  The Patient will follow up PRN if the symptoms worsen.  2.  Gastroesophageal reflux disease without esophagitis Continue PPI as already ordered, this medication has been reviewed and there are no changes at this time.  Avoidence of caffeine and alcohol  Moderate elevation of the head of the bed  3. Primary osteoarthritis involving multiple joints Continue NSAID medications as already ordered, these medications have been reviewed and there are no changes at this time.  Continued activity and therapy was stressed.   4. Mixed hyperlipidemia Continue statin as ordered and reviewed, no changes at this time    Hortencia Pilar, MD  09/07/2018 2:44 PM

## 2018-09-22 ENCOUNTER — Encounter (INDEPENDENT_AMBULATORY_CARE_PROVIDER_SITE_OTHER): Payer: Self-pay

## 2018-09-22 NOTE — Telephone Encounter (Signed)
From a vascular standpoint you do not need to resume aspirin.  I would also consult with you PCP to determine if there are other conditions he may want you to take aspirin for.

## 2018-09-22 NOTE — Telephone Encounter (Signed)
Please advise below.

## 2018-09-24 ENCOUNTER — Other Ambulatory Visit: Payer: Self-pay | Admitting: Family Medicine

## 2018-09-25 ENCOUNTER — Other Ambulatory Visit: Payer: Self-pay | Admitting: Family Medicine

## 2018-09-25 NOTE — Telephone Encounter (Signed)
Patient cancelled her Wellness visit/physical in April. Shirlean Mylar can you please call and re schedule with patient. Thank you

## 2018-10-11 ENCOUNTER — Telehealth (INDEPENDENT_AMBULATORY_CARE_PROVIDER_SITE_OTHER): Payer: Self-pay | Admitting: Vascular Surgery

## 2018-10-12 ENCOUNTER — Other Ambulatory Visit: Payer: Self-pay | Admitting: Family Medicine

## 2018-10-12 NOTE — Telephone Encounter (Signed)
Last office visit 05/26/2018 for hospital follow up.  Last refilled 05/26/2018 for #30 with no refills.  No future appointments.

## 2018-10-13 ENCOUNTER — Other Ambulatory Visit: Payer: Self-pay

## 2018-10-13 NOTE — Telephone Encounter (Signed)
Last office visit 05/26/2018 for hospital follow up.  Last refilled 05/26/2018 for #30 with no refills.  No future appointments.

## 2018-10-16 ENCOUNTER — Other Ambulatory Visit (INDEPENDENT_AMBULATORY_CARE_PROVIDER_SITE_OTHER): Payer: Medicare Other

## 2018-10-16 DIAGNOSIS — D500 Iron deficiency anemia secondary to blood loss (chronic): Secondary | ICD-10-CM

## 2018-10-16 LAB — CBC WITH DIFFERENTIAL/PLATELET
Basophils Absolute: 0 10*3/uL (ref 0.0–0.1)
Basophils Relative: 0.6 % (ref 0.0–3.0)
Eosinophils Absolute: 0.1 10*3/uL (ref 0.0–0.7)
Eosinophils Relative: 2.3 % (ref 0.0–5.0)
HCT: 38.7 % (ref 36.0–46.0)
Hemoglobin: 13.1 g/dL (ref 12.0–15.0)
Lymphocytes Relative: 24.7 % (ref 12.0–46.0)
Lymphs Abs: 1.2 10*3/uL (ref 0.7–4.0)
MCHC: 33.9 g/dL (ref 30.0–36.0)
MCV: 91.8 fl (ref 78.0–100.0)
Monocytes Absolute: 0.5 10*3/uL (ref 0.1–1.0)
Monocytes Relative: 10.6 % (ref 3.0–12.0)
Neutro Abs: 3.1 10*3/uL (ref 1.4–7.7)
Neutrophils Relative %: 61.8 % (ref 43.0–77.0)
Platelets: 173 10*3/uL (ref 150.0–400.0)
RBC: 4.21 Mil/uL (ref 3.87–5.11)
RDW: 13.7 % (ref 11.5–15.5)
WBC: 4.9 10*3/uL (ref 4.0–10.5)

## 2018-10-16 LAB — FOLATE: Folate: 8.8 ng/mL (ref >5.9)

## 2018-10-16 LAB — IBC PANEL
Iron: 95 ug/dL (ref 42–145)
Saturation Ratios: 28.3 % (ref 20.0–50.0)
Transferrin: 240 mg/dL (ref 212.0–360.0)

## 2018-10-16 LAB — FERRITIN: Ferritin: 26.1 ng/mL (ref 10.0–291.0)

## 2018-10-17 ENCOUNTER — Other Ambulatory Visit: Payer: Self-pay

## 2018-10-17 DIAGNOSIS — D500 Iron deficiency anemia secondary to blood loss (chronic): Secondary | ICD-10-CM

## 2018-10-17 DIAGNOSIS — K7469 Other cirrhosis of liver: Secondary | ICD-10-CM

## 2018-10-18 ENCOUNTER — Encounter: Payer: Self-pay | Admitting: Family Medicine

## 2018-10-18 NOTE — Telephone Encounter (Signed)
Left message asking pt to call office mailed letter

## 2018-10-27 DIAGNOSIS — N819 Female genital prolapse, unspecified: Secondary | ICD-10-CM | POA: Diagnosis not present

## 2018-10-27 DIAGNOSIS — R3915 Urgency of urination: Secondary | ICD-10-CM | POA: Diagnosis not present

## 2018-11-20 ENCOUNTER — Other Ambulatory Visit: Payer: Self-pay | Admitting: Family Medicine

## 2019-01-01 ENCOUNTER — Other Ambulatory Visit: Payer: Self-pay | Admitting: Family Medicine

## 2019-01-03 NOTE — Telephone Encounter (Signed)
Robin,  Can we try to schedule Ms. Brianna Barnes with MW Nurse and fasting labs prior to her physical with Dr. Diona Browner on 01/12/2019?

## 2019-01-05 ENCOUNTER — Telehealth: Payer: Self-pay

## 2019-01-05 NOTE — Telephone Encounter (Signed)
LVM for daughter to call clinic, pt needs  COVID screen, front door and back lab info

## 2019-01-08 ENCOUNTER — Ambulatory Visit (INDEPENDENT_AMBULATORY_CARE_PROVIDER_SITE_OTHER): Payer: Medicare Other

## 2019-01-08 DIAGNOSIS — Z0000 Encounter for general adult medical examination without abnormal findings: Secondary | ICD-10-CM

## 2019-01-08 NOTE — Patient Instructions (Signed)
Brianna Barnes , Thank you for taking time to come for your Medicare Wellness Visit. I appreciate your ongoing commitment to your health goals. Please review the following plan we discussed and let me know if I can assist you in the future.   Screening recommendations/referrals: Colonoscopy: no longer required Mammogram: up to date, completed 02/24/2018 Bone Density: up to date, completed 02/11/2016 Recommended yearly ophthalmology/optometry visit for glaucoma screening and checkup Recommended yearly dental visit for hygiene and checkup  Vaccinations: Influenza vaccine: up to date, completed 11/21/2018 Pneumococcal vaccine: Completed series Tdap vaccine: up to date, completed 02/25/2017 Shingles vaccine: will check with pharmacy     Advanced directives: Please bring a copy of your POA (Power of [place]) and/or Living Will to your next appointment.   Conditions/risks identified: hypertension, hyperlipidemia  Next appointment: 01/12/2019 @ 2 pm   Preventive Care 65 Years and Older, Female Preventive care refers to lifestyle choices and visits with your health care provider that can promote health and wellness. What does preventive care include?  A yearly physical exam. This is also called an annual well check.  Dental exams once or twice a year.  Routine eye exams. Ask your health care provider how often you should have your eyes checked.  Personal lifestyle choices, including:  Daily care of your teeth and gums.  Regular physical activity.  Eating a healthy diet.  Avoiding tobacco and drug use.  Limiting alcohol use.  Practicing safe sex.  Taking low-dose aspirin every day.  Taking vitamin and mineral supplements as recommended by your health care provider. What happens during an annual well check? The services and screenings done by your health care provider during your annual well check will depend on your age, overall health, lifestyle risk factors, and family history  of disease. Counseling  Your health care provider may ask you questions about your:  Alcohol use.  Tobacco use.  Drug use.  Emotional well-being.  Home and relationship well-being.  Sexual activity.  Eating habits.  History of falls.  Memory and ability to understand (cognition).  Work and work Statistician.  Reproductive health. Screening  You may have the following tests or measurements:  Height, weight, and BMI.  Blood pressure.  Lipid and cholesterol levels. These may be checked every 5 years, or more frequently if you are over 52 years old.  Skin check.  Lung cancer screening. You may have this screening every year starting at age 61 if you have a 30-pack-year history of smoking and currently smoke or have quit within the past 15 years.  Fecal occult blood test (FOBT) of the stool. You may have this test every year starting at age 11.  Flexible sigmoidoscopy or colonoscopy. You may have a sigmoidoscopy every 5 years or a colonoscopy every 10 years starting at age 69.  Hepatitis C blood test.  Hepatitis B blood test.  Sexually transmitted disease (STD) testing.  Diabetes screening. This is done by checking your blood sugar (glucose) after you have not eaten for a while (fasting). You may have this done every 1-3 years.  Bone density scan. This is done to screen for osteoporosis. You may have this done starting at age 10.  Mammogram. This may be done every 1-2 years. Talk to your health care provider about how often you should have regular mammograms. Talk with your health care provider about your test results, treatment options, and if necessary, the need for more tests. Vaccines  Your health care provider may recommend certain vaccines, such  as:  Influenza vaccine. This is recommended every year.  Tetanus, diphtheria, and acellular pertussis (Tdap, Td) vaccine. You may need a Td booster every 10 years.  Zoster vaccine. You may need this after age 25.   Pneumococcal 13-valent conjugate (PCV13) vaccine. One dose is recommended after age 53.  Pneumococcal polysaccharide (PPSV23) vaccine. One dose is recommended after age 42. Talk to your health care provider about which screenings and vaccines you need and how often you need them. This information is not intended to replace advice given to you by your health care provider. Make sure you discuss any questions you have with your health care provider. Document Released: 03/28/2015 Document Revised: 11/19/2015 Document Reviewed: 12/31/2014 Elsevier Interactive Patient Education  2017 [place] Prevention in the Home Falls can cause injuries. They can happen to people of all ages. There are many things you can do to make your home safe and to help prevent falls. What can I do on the outside of my home?  Regularly fix the edges of walkways and driveways and fix any cracks.  Remove anything that might make you trip as you walk through a door, such as a raised step or threshold.  Trim any bushes or trees on the path to your home.  Use bright outdoor lighting.  Clear any walking paths of anything that might make someone trip, such as rocks or tools.  Regularly check to see if handrails are loose or broken. Make sure that both sides of any steps have handrails.  Any raised decks and porches should have guardrails on the edges.  Have any leaves, snow, or ice cleared regularly.  Use sand or salt on walking paths during winter.  Clean up any spills in your garage right away. This includes oil or grease spills. What can I do in the bathroom?  Use night lights.  Install grab bars by the toilet and in the tub and shower. Do not use towel bars as grab bars.  Use non-skid mats or decals in the tub or shower.  If you need to sit down in the shower, use a plastic, non-slip stool.  Keep the floor dry. Clean up any water that spills on the floor as soon as it happens.  Remove soap  buildup in the tub or shower regularly.  Attach bath mats securely with double-sided non-slip rug tape.  Do not have throw rugs and other things on the floor that can make you trip. What can I do in the bedroom?  Use night lights.  Make sure that you have a light by your bed that is easy to reach.  Do not use any sheets or blankets that are too big for your bed. They should not hang down onto the floor.  Have a firm chair that has side arms. You can use this for support while you get dressed.  Do not have throw rugs and other things on the floor that can make you trip. What can I do in the kitchen?  Clean up any spills right away.  Avoid walking on wet floors.  Keep items that you use a lot in easy-to-reach places.  If you need to reach something above you, use a strong step stool that has a grab bar.  Keep electrical cords out of the way.  Do not use floor polish or wax that makes floors slippery. If you must use wax, use non-skid floor wax.  Do not have throw rugs and other things on the  floor that can make you trip. What can I do with my stairs?  Do not leave any items on the stairs.  Make sure that there are handrails on both sides of the stairs and use them. Fix handrails that are broken or loose. Make sure that handrails are as long as the stairways.  Check any carpeting to make sure that it is firmly attached to the stairs. Fix any carpet that is loose or worn.  Avoid having throw rugs at the top or bottom of the stairs. If you do have throw rugs, attach them to the floor with carpet tape.  Make sure that you have a light switch at the top of the stairs and the bottom of the stairs. If you do not have them, ask someone to add them for you. What else can I do to help prevent falls?  Wear shoes that:  Do not have high heels.  Have rubber bottoms.  Are comfortable and fit you well.  Are closed at the toe. Do not wear sandals.  If you use a stepladder:  Make  sure that it is fully opened. Do not climb a closed stepladder.  Make sure that both sides of the stepladder are locked into place.  Ask someone to hold it for you, if possible.  Clearly mark and make sure that you can see:  Any grab bars or handrails.  First and last steps.  Where the edge of each step is.  Use tools that help you move around (mobility aids) if they are needed. These include:  Canes.  Walkers.  Scooters.  Crutches.  Turn on the lights when you go into a dark area. Replace any light bulbs as soon as they burn out.  Set up your furniture so you have a clear path. Avoid moving your furniture around.  If any of your floors are uneven, fix them.  If there are any pets around you, be aware of where they are.  Review your medicines with your doctor. Some medicines can make you feel dizzy. This can increase your chance of falling. Ask your doctor what other things that you can do to help prevent falls. This information is not intended to replace advice given to you by your health care provider. Make sure you discuss any questions you have with your health care provider. Document Released: 12/26/2008 Document Revised: 08/07/2015 Document Reviewed: 04/05/2014 Elsevier Interactive Patient Education  2017 [employer].

## 2019-01-08 NOTE — Progress Notes (Addendum)
PCP notes:  Health Maintenance: will check with pharmacy about Shingrix vaccine    Abnormal Screenings: none    Patient concerns: none    Nurse concerns: none    Next PCP appt.: 01/12/2019 @ 2 pm

## 2019-01-08 NOTE — Progress Notes (Signed)
Subjective:   Brianna Barnes is a 85 y.o. female who presents for Medicare Annual (Subsequent) preventive examination.  Review of Systems:    This visit is being conducted through telemedicine via telephone at the nurse health advisor's home address due to the COVID-19 pandemic. This patient has given me verbal consent via doximity to conduct this visit, patient states they are participating from their home address. There is no referral for this visit. Some vital signs may be absent or patient reported.    Patient identification: identified by name, DOB, and current address Patient has given verbal consent to speak with her daughter Hadley Pen) on her behalf because she is very hard of hearing. Patient, myself and Hadley Pen are on the phone call.   Cardiac Risk Factors include: advanced age (>69men, >84 women);hypertension;dyslipidemia     Objective:     Vitals: There were no vitals taken for this visit.  There is no height or weight on file to calculate BMI.  Advanced Directives 01/08/2019 05/16/2018 05/15/2018 01/22/2018 01/21/2018 01/30/2016 07/15/2014  Does Patient Have a Medical Advance Directive? Yes Yes Yes No No Yes Yes  Type of Paramedic of [place];Living will Healthcare Power of [place];Living will [place];Living will  Does patient want to make changes to medical advance directive? - No - Patient declined - - - - No - Patient declined  Copy of [place] in Chart? No - copy requested No - copy requested No - copy requested - - Yes No - copy requested  Would patient like information on creating a medical advance directive? - - - No - Patient declined No - Patient declined - -  Pre-existing out of facility DNR order (yellow form or pink MOST form) - - - - - - -    Tobacco Social History   Tobacco Use  Smoking Status Never Smoker   Smokeless Tobacco Never Used     Counseling given: Not Answered   Clinical Intake:  Pre-visit preparation completed: Yes  Pain : No/denies pain     Nutritional Risks: None Diabetes: No  How often do you need to have someone help you when you read instructions, pamphlets, or other written materials from your doctor or pharmacy?: 1 - Never What is the last grade level you completed in school?: 10th  Interpreter Needed?: No  Information entered by :: CJohnson, LPN  Past Medical History:  Diagnosis Date  . Arthritis   . Choledocholithiasis   . Cirrhosis, cryptogenic (HCC)    FOLLOWED BY DR PYRTLE-- LOV  IN EPIC--  STABLE PER NOTE  . Common bile duct stone   . Cryptogenic cirrhosis ([place])   . DDD (degenerative disc disease), lumbar   . Diverticulosis   . Esophageal varix ([place])   . GERD (gastroesophageal reflux disease)   . Hiatal hernia   . History of colonoscopy with polypectomy    ADENOMATOUS AND HYPERPLASIA POLYPS  (2008  &  2010)  . History of hepatitis C   . History of positive PPD    + TB SKIN TEST WITHOUT TB  . Hyperlipidemia   . Hypertension, portal ([place])    SECONDARY TO CRYPTOGENIC CIRRHOSIS  . Internal hemorrhoids   . Iron deficiency anemia   . Loss of hearing    right ear  . Osteoarthritis    hands, knees, and low back  . Osteoporosis   . Portal hypertension (  [place])   . PUD (peptic ulcer disease)   . Tubular adenoma of colon   . Uterovaginal prolapse, incomplete   . Wears glasses   . Wears hearing aid    left ear only   Past Surgical History:  Procedure Laterality Date  . APPENDECTOMY  1960'S  . CHOLECYSTECTOMY  08/25/2011   Procedure: LAPAROSCOPIC CHOLECYSTECTOMY WITH INTRAOPERATIVE CHOLANGIOGRAM;  Surgeon: Zenovia Jarred, MD;  Location: [place];  Service: General;  Laterality: N/A;  . ENDOSCOPIC RETROGRADE CHOLANGIOPANCREATOGRAPHY (ERCP) WITH PROPOFOL N/A 11/30/2012   Procedure: ENDOSCOPIC RETROGRADE CHOLANGIOPANCREATOGRAPHY (ERCP) WITH PROPOFOL;   Surgeon: Milus Banister, MD;  Location: [facility];  Service: Endoscopy;  Laterality: N/A;  . ESOPHAGOGASTRODUODENOSCOPY N/A 05/18/2018   Procedure: ESOPHAGOGASTRODUODENOSCOPY (EGD);  Surgeon: Lin Landsman, MD;  Location[facility] ENDOSCOPY;  Service: Gastroenterology;  Laterality: N/A;  . EXTERNAL EAR SURGERY Left 1980  . HYSTEROSCOPY W/D&C N/A 07/15/2014   Procedure: DILATATION AND CURETTAGE /HYSTEROSCOPY;  Surgeon: Molli Posey, MD;  [facility];  Service: Gynecology;  Laterality: N/A;  . INTRAMEDULLARY (IM) NAIL INTERTROCHANTERIC Left 01/22/2018   Procedure: ORIF LEFT INTERTROCHANTRIC HIP FX;  Surgeon: Mcarthur Rossetti, MD;  Location: [place];  Service: Orthopedics;  Laterality: Left;  . KNEE ARTHROSCOPY W/ MENISCECTOMY Right 06-19-2002  . [place] SURGERY  1990  . REMOVAL OF URINARY SLING N/A 09/13/2013   Procedure: REMOVAL OF RETAINED PESSARY;  Surgeon: Jorja Loa, MD;  Location[facility];  Service: Urology;  Laterality: N/A;   Family History  Problem Relation Age of Onset  . Heart disease Mother   . Heart disease Father 60  . Hyperlipidemia Father   . Diabetes Father   . Cirrhosis Sister   . Diabetes Brother   . Heart disease Brother   . Cirrhosis Brother   . Pancreatic cancer Brother   . Colon cancer Neg Hx   . Colon polyps Neg Hx   . Esophageal cancer Neg Hx   . Stomach cancer Neg Hx   . Inflammatory bowel disease Neg Hx    Social History   Socioeconomic History  . Marital status: Widowed    Spouse name: Not on file  . Number of children: 3  . Years of education: Not on file  . Highest education level: Not on file  Occupational History  . Occupation: Retired, [employer]  Social Needs  . Financial resource strain: Not hard at all  . Food insecurity    Worry: Never true    Inability: Never true  . Transportation needs    Medical: No    Non-medical: No  Tobacco Use  . Smoking status: Never Smoker  .  Smokeless tobacco: Never Used  Substance and Sexual Activity  . Alcohol use: No  . Drug use: No  . Sexual activity: Not Currently  Lifestyle  . Physical activity    Days per week: 0 days    Minutes per session: 0 min  . Stress: Only a little  Relationships  . Social Herbalist on phone: Not on file    Gets together: Not on file    Attends religious service: Not on file    Active member of club or organization: Not on file    Attends meetings of clubs or organizations: Not on file    Relationship status: Not on file  Other Topics Concern  . Not on file  Social History Narrative        Outpatient Encounter  Medications as of 01/08/2019  Medication Sig  . alendronate (FOSAMAX) 70 MG tablet TAKE 1 TABLET BY MOUTH ONCE A WEEK. TAKE WITH A FULL GLASS OF WATER ON AN EMPTY STOMACH.  . Calcium-Phosphorus-Vitamin D (CALCIUM GUMMIES PO) Take 1 tablet by mouth daily.  . diclofenac sodium (VOLTAREN) 1 % GEL Apply 4 g topically 4 (four) times daily. (Patient taking differently: Apply 4 g topically 4 (four) times daily as needed. )  . Iron Combinations (IRON COMPLEX PO) Take 1 tablet by mouth daily.  Marland Kitchen lidocaine (LIDODERM) 5 % Place 1 patch onto the skin daily. Remove & Discard patch within 12 hours or as directed by MD  . pantoprazole (PROTONIX) 40 MG tablet TAKE 1 TABLET BY MOUTH DAILY BEFORE BREAKFAST  . sennosides-docusate sodium (SENOKOT-S) 8.6-50 MG tablet Take 1 tablet by mouth daily as needed for constipation.  . simethicone (MYLICON) 0000000 MG chewable tablet Chew 125 mg by mouth every 6 (six) hours as needed for flatulence.  . timolol (TIMOPTIC) 0.5 % ophthalmic solution Place 1 drop into both eyes daily.   . traMADol (ULTRAM) 50 MG tablet TAKE ONE-HALF TO ONE TABLET BY MOUTH UP TO EVERY 12 HOURS AS NEEDED FOR MODERATE PAIN.  . traZODone (DESYREL) 150 MG tablet TAKE 1 TABLET BY MOUTH EVERYDAY AT BEDTIME  . vitamin E 400 UNIT capsule Take 400 Units by mouth daily.  Marland Kitchen apixaban  (ELIQUIS) 2.5 MG TABS tablet Take 2.5 mg by mouth 2 (two) times daily.   No facility-administered encounter medications on file as of 01/08/2019.     Activities of Daily Living In your present state of health, do you have any difficulty performing the following activities: 01/08/2019 05/16/2018  Hearing? Y Y  Comment hearing aids -  Vision? N N  Difficulty concentrating or making decisions? N N  Walking or climbing stairs? N Y  Dressing or bathing? N N  Doing errands, shopping? N Y  Conservation officer, nature and eating ? N -  Using the Toilet? N -  In the past six months, have you accidently leaked urine? Y -  Comment wears pad -  Do you have problems with loss of bowel control? N -  Managing your Medications? N -  Managing your Finances? N -  Housekeeping or managing your Housekeeping? N -  Some recent data might be hidden    Patient Care Team: Jinny Sanders, MD as PCP - General    Assessment:   This is a routine wellness examination for Kimoni.  Exercise Activities and Dietary recommendations Current Exercise Habits: Home exercise routine, Type of exercise: walking, Time (Minutes): 15, Frequency (Times/Week): 7, Weekly Exercise (Minutes/Week): 105, Intensity: Mild, Exercise limited by: None identified  Goals    . Patient Stated     01/08/2019, Patient will maintain and continue medications as prescribed.        Fall Risk Fall Risk  01/08/2019 02/25/2017 01/30/2016 01/28/2015 01/25/2014  Falls in the past year? 1 No No No No  Number falls in past yr: 0 - - - -  Injury with Fall? 1 - - - -  Comment hip fracture - - - -  Risk for fall due to : History of fall(s) - - - -  Follow up Falls evaluation completed;Falls prevention discussed - - - -   Is the patient's home free of loose throw rugs in walkways, pet beds, electrical cords, etc?   yes      Grab bars in the bathroom? yes  Handrails on the stairs?   yes      Adequate lighting?   yes  Timed Get Up and Go performed:  N/A  Depression Screen PHQ 2/9 Scores 01/08/2019 02/25/2017 01/30/2016 01/28/2015  PHQ - 2 Score 0 0 0 0  PHQ- 9 Score 0 - - -     Cognitive Function MMSE - Mini Mental State Exam 01/08/2019  Not completed: Unable to complete       Mini Cog  Mini-Cog screen was not completed. Patient is hard of hearing and Hadley Pen (patient's daughter) completed visit for her with patient's verbal consent. Maximum score is 22. A value of 0 denotes this part of the MMSE was not completed or the patient failed this part of the Mini-Cog screening.  Immunization History  Administered Date(s) Administered  . Fluad Quad(high Dose 65+) 11/21/2018  . Influenza Split 12/27/2011  . Influenza Whole 01/13/2009, 12/27/2011  . Influenza, High Dose Seasonal PF 11/29/2016, 12/05/2017  . Influenza, Seasonal, Injecte, Preservative Fre 12/18/2014, 11/27/2015  . Influenza-Unspecified 11/17/2016, 11/21/2018  . Pneumococcal Conjugate-13 01/25/2014  . Pneumococcal Polysaccharide-23 03/15/2005  . Td 03/15/2002  . Tdap 02/25/2017  . Zoster 07/14/2011    Qualifies for Shingles Vaccine? Yes  Screening Tests Health Maintenance  Topic Date Due  . MAMMOGRAM  02/25/2019  . TETANUS/TDAP  02/26/2027  . INFLUENZA VACCINE  Completed  . DEXA SCAN  Completed  . PNA vac Low Risk Adult  Completed    Cancer Screenings: Lung: Low Dose CT Chest recommended if Age 69-80 years, 30 pack-year currently smoking OR have quit w/in 15years. Patient does not qualify. Breast:  Up to date on Mammogram? Yes, completed 02/24/2018   Up to date of Bone Density/Dexa? Yes, completed 02/11/2016 Colorectal: no longer required  Additional Screenings:  Hepatitis C Screening: N/A     Plan:   Patient will maintain and continue medications as prescribed.   I have personally reviewed and noted the following in the patient's chart:   . Medical and social history . Use of alcohol, tobacco or illicit drugs  . Current medications and  supplements . Functional ability and status . Nutritional status . Physical activity . Advanced directives . List of other physicians . Hospitalizations, surgeries, and ER visits in previous 12 months . Vitals . Screenings to include cognitive, depression, and falls . Referrals and appointments  In addition, I have reviewed and discussed with patient certain preventive protocols, quality metrics, and best practice recommendations. A written personalized care plan for preventive services as well as general preventive health recommendations were provided to patient.     Andrez Grime, LPN  [id number]

## 2019-01-09 ENCOUNTER — Telehealth: Payer: Self-pay | Admitting: Family Medicine

## 2019-01-09 ENCOUNTER — Other Ambulatory Visit (INDEPENDENT_AMBULATORY_CARE_PROVIDER_SITE_OTHER): Payer: Medicare Other

## 2019-01-09 DIAGNOSIS — E782 Mixed hyperlipidemia: Secondary | ICD-10-CM

## 2019-01-09 DIAGNOSIS — D509 Iron deficiency anemia, unspecified: Secondary | ICD-10-CM

## 2019-01-09 DIAGNOSIS — E559 Vitamin D deficiency, unspecified: Secondary | ICD-10-CM

## 2019-01-09 LAB — COMPREHENSIVE METABOLIC PANEL
ALT: 18 U/L (ref 0–35)
AST: 37 U/L (ref 0–37)
Albumin: 4.1 g/dL (ref 3.5–5.2)
Alkaline Phosphatase: 70 U/L (ref 39–117)
BUN: 10 mg/dL (ref 6–23)
CO2: 32 mEq/L (ref 19–32)
Calcium: 10.1 mg/dL (ref 8.4–10.5)
Chloride: 103 mEq/L (ref 96–112)
Creatinine, Ser: 0.59 mg/dL (ref 0.40–1.20)
GFR: 96.18 mL/min (ref >60.00)
Glucose, Bld: 110 mg/dL — ABNORMAL HIGH (ref 70–99)
Potassium: 3.7 mEq/L (ref 3.5–5.1)
Sodium: 141 mEq/L (ref 135–145)
Total Bilirubin: 0.8 mg/dL (ref 0.2–1.2)
Total Protein: 6.4 g/dL (ref 6.0–8.3)

## 2019-01-09 NOTE — Telephone Encounter (Signed)
-----   Message from Cloyd Stagers, RT sent at 01/03/2019  1:50 PM EDT ----- Regarding: Lab Orders for Tuesday 10.27.2020 Please place lab orders for Tuesday 10.27.2020, office visit for physical on Friday 10.30.2020 Thank you, Dyke Maes RT(R)

## 2019-01-11 LAB — VITAMIN D 25 HYDROXY (VIT D DEFICIENCY, FRACTURES): VITD: 43.75 ng/mL (ref 30.00–100.00)

## 2019-01-12 ENCOUNTER — Ambulatory Visit (INDEPENDENT_AMBULATORY_CARE_PROVIDER_SITE_OTHER): Payer: Medicare Other | Admitting: Family Medicine

## 2019-01-12 ENCOUNTER — Encounter: Payer: Self-pay | Admitting: Family Medicine

## 2019-01-12 ENCOUNTER — Other Ambulatory Visit: Payer: Self-pay

## 2019-01-12 VITALS — BP 110/72 | HR 106 | Temp 98.3°F | Ht <= 58 in | Wt 114.5 lb

## 2019-01-12 DIAGNOSIS — F0390 Unspecified dementia without behavioral disturbance: Secondary | ICD-10-CM

## 2019-01-12 DIAGNOSIS — I10 Essential (primary) hypertension: Secondary | ICD-10-CM

## 2019-01-12 DIAGNOSIS — F5104 Psychophysiologic insomnia: Secondary | ICD-10-CM

## 2019-01-12 DIAGNOSIS — E559 Vitamin D deficiency, unspecified: Secondary | ICD-10-CM | POA: Diagnosis not present

## 2019-01-12 DIAGNOSIS — D509 Iron deficiency anemia, unspecified: Secondary | ICD-10-CM

## 2019-01-12 DIAGNOSIS — E782 Mixed hyperlipidemia: Secondary | ICD-10-CM

## 2019-01-12 DIAGNOSIS — K7469 Other cirrhosis of liver: Secondary | ICD-10-CM

## 2019-01-12 DIAGNOSIS — Z0000 Encounter for general adult medical examination without abnormal findings: Secondary | ICD-10-CM | POA: Diagnosis not present

## 2019-01-12 DIAGNOSIS — Z1231 Encounter for screening mammogram for malignant neoplasm of breast: Secondary | ICD-10-CM

## 2019-01-12 DIAGNOSIS — M810 Age-related osteoporosis without current pathological fracture: Secondary | ICD-10-CM

## 2019-01-12 DIAGNOSIS — I82412 Acute embolism and thrombosis of left femoral vein: Secondary | ICD-10-CM

## 2019-01-12 DIAGNOSIS — F03A0 Unspecified dementia, mild, without behavioral disturbance, psychotic disturbance, mood disturbance, and anxiety: Secondary | ICD-10-CM

## 2019-01-12 DIAGNOSIS — E441 Mild protein-calorie malnutrition: Secondary | ICD-10-CM

## 2019-01-12 MED ORDER — DONEPEZIL HCL 5 MG PO TABS: 5.0000 mg | ORAL_TABLET | Freq: Every day | ORAL | 3 refills | Status: DC

## 2019-01-12 NOTE — Assessment & Plan Note (Signed)
Trial of aricept.

## 2019-01-12 NOTE — Assessment & Plan Note (Signed)
Resolved.. now off eliquis

## 2019-01-12 NOTE — Assessment & Plan Note (Signed)
Resolved on iron... high risk for bleed

## 2019-01-12 NOTE — Patient Instructions (Addendum)
Trail of aricept for dementia.  Call if any side effects.  Keep up walking as much as able.

## 2019-01-12 NOTE — Assessment & Plan Note (Signed)
Well controlled. Continue current medication.  

## 2019-01-12 NOTE — Assessment & Plan Note (Signed)
Due for re-eval ofd DEXa on fosamax

## 2019-01-12 NOTE — Progress Notes (Signed)
Chief Complaint  Patient presents with  . Annual Exam    Part 2    History of Present Illness: HPI  The patient presents for  complete physical and review of chronic health problems. He/She also has the following acute concerns today:  The patient saw a LPN or RN for medicare wellness visit.  Prevention and wellness was reviewed in detail. Note reviewed and important notes copied below.    Slight decreased in memory, gradaully change in last few month  Health Maintenance: will check with pharmacy about Shingrix vaccine Abnormal Screenings: none  01/12/19  Hypertension:   Good control on no med. BP Readings from Last 3 Encounters:  01/12/19 110/72  09/07/18 (!) 162/80  05/29/18 (!) 156/82  Using medication without problems or lightheadedness: none Chest pain with exertion:none Edema:none Short of breath:none Average home BPs: Other issues:  cryptogenic cirrhosis with portal hypertension, compensated: stable LFTs and followed by GI Dr. Hilarie Fredrickson  Duodenal angiodysplastic lesions --ablated by APC.  ... can lead to a chronic iron deficiency anemia and GI bleeding. On PPI and iron.  Hg 13 in 10/2018, following q 3 months  chronic insomnia: is taking trazodone, this is helping  DVT earlier in year.. Followed by vascular Dr. Delana Meyer, competed eliquis 08/2018.  COVID 19 screen No recent travel or known exposure to COVID19 The patient denies respiratory symptoms of COVID 19 at this time.  The importance of social distancing was discussed today.   Review of Systems  Constitutional: Negative for chills and fever.  HENT: Negative for congestion and ear pain.   Eyes: Negative for pain and redness.  Respiratory: Negative for cough and shortness of breath.   Cardiovascular: Negative for chest pain, palpitations and leg swelling.  Gastrointestinal: Negative for abdominal pain, blood in stool, constipation, diarrhea, nausea and vomiting.  Genitourinary: Negative for dysuria.   Musculoskeletal: Negative for falls and myalgias.  Skin: Negative for rash.  Neurological: Negative for dizziness.  Psychiatric/Behavioral: Negative for depression. The patient is not nervous/anxious.       Past Medical History:  Diagnosis Date  . Arthritis   . Choledocholithiasis   . Cirrhosis, cryptogenic (HCC)    FOLLOWED BY DR PYRTLE-- LOV  IN EPIC--  STABLE PER NOTE  . Common bile duct stone   . Cryptogenic cirrhosis ([place])   . DDD (degenerative disc disease), lumbar   . Diverticulosis   . Esophageal varix ([place])   . GERD (gastroesophageal reflux disease)   . Hiatal hernia   . History of colonoscopy with polypectomy    ADENOMATOUS AND HYPERPLASIA POLYPS  (2008  &  2010)  . History of hepatitis C   . History of positive PPD    + TB SKIN TEST WITHOUT TB  . Hyperlipidemia   . Hypertension, portal ([place])    SECONDARY TO CRYPTOGENIC CIRRHOSIS  . Internal hemorrhoids   . Iron deficiency anemia   . Loss of hearing    right ear  . Osteoarthritis    hands, knees, and low back  . Osteoporosis   . Portal hypertension ([place])   . PUD (peptic ulcer disease)   . Tubular adenoma of colon   . Uterovaginal prolapse, incomplete   . Wears glasses   . Wears hearing aid    left ear only    reports that she has never smoked. She has never used smokeless tobacco. She reports that she does not drink alcohol or use drugs.   Current Outpatient Medications:  .  alendronate (FOSAMAX) 70  MG tablet, TAKE 1 TABLET BY MOUTH ONCE A WEEK. TAKE WITH A FULL GLASS OF WATER ON AN EMPTY STOMACH., Disp: 12 tablet, Rfl: 0 .  Calcium-Phosphorus-Vitamin D (CALCIUM GUMMIES PO), Take 1 tablet by mouth daily., Disp: , Rfl:  .  diclofenac sodium (VOLTAREN) 1 % GEL, Apply 4 g topically 4 (four) times daily. (Patient taking differently: Apply 4 g topically 4 (four) times daily as needed. ), Disp: 100 g, Rfl: 11 .  Iron Combinations (IRON COMPLEX PO), Take 1 tablet by mouth daily., Disp: , Rfl:  .  lidocaine  (LIDODERM) 5 %, Place 1 patch onto the skin daily. Remove & Discard patch within 12 hours or as directed by MD, Disp: 30 patch, Rfl: 0 .  pantoprazole (PROTONIX) 40 MG tablet, TAKE 1 TABLET BY MOUTH DAILY BEFORE BREAKFAST, Disp: 90 tablet, Rfl: 1 .  sennosides-docusate sodium (SENOKOT-S) 8.6-50 MG tablet, Take 1 tablet by mouth daily as needed for constipation., Disp: , Rfl:  .  simethicone (MYLICON) 0000000 MG chewable tablet, Chew 125 mg by mouth every 6 (six) hours as needed for flatulence., Disp: , Rfl:  .  timolol (TIMOPTIC) 0.5 % ophthalmic solution, Place 1 drop into both eyes daily. , Disp: , Rfl:  .  traMADol (ULTRAM) 50 MG tablet, TAKE ONE-HALF TO ONE TABLET BY MOUTH UP TO EVERY 12 HOURS AS NEEDED FOR MODERATE PAIN., Disp: 30 tablet, Rfl: 0 .  traZODone (DESYREL) 150 MG tablet, TAKE 1 TABLET BY MOUTH EVERYDAY AT BEDTIME, Disp: 90 tablet, Rfl: 0 .  vitamin E 400 UNIT capsule, Take 400 Units by mouth daily., Disp: , Rfl:    Observations/Objective: Blood pressure 110/72, pulse (!) 106, temperature 98.3 F (36.8 C), temperature source Temporal, height 4\' 10"  (1.473 m), weight 114 lb 8 oz (51.9 kg), SpO2 97 %.  Physical Exam Constitutional:      General: She is not in acute distress.    Appearance: Normal appearance. She is well-developed. She is not ill-appearing or toxic-appearing.  HENT:     Head: Normocephalic.     Right Ear: Hearing, tympanic membrane, ear canal and external ear normal.     Left Ear: Hearing, tympanic membrane, ear canal and external ear normal.     Nose: Nose normal.  Eyes:     General: Lids are normal. Lids are everted, no foreign bodies appreciated.     Conjunctiva/sclera: Conjunctivae normal.     Pupils: Pupils are equal, round, and reactive to light.  Neck:     Musculoskeletal: Normal range of motion and neck supple.     Thyroid: No thyroid mass or thyromegaly.     Vascular: No carotid bruit.     Trachea: Trachea normal.  Cardiovascular:     Rate and  Rhythm: Normal rate and regular rhythm.     Heart sounds: Normal heart sounds, S1 normal and S2 normal. No murmur. No gallop.   Pulmonary:     Effort: Pulmonary effort is normal. No respiratory distress.     Breath sounds: Normal breath sounds. No wheezing, rhonchi or rales.  Abdominal:     General: Bowel sounds are normal. There is no distension or abdominal bruit.     Palpations: Abdomen is soft. There is no fluid wave or mass.     Tenderness: There is no abdominal tenderness. There is no guarding or rebound.     Hernia: No hernia is present.  Lymphadenopathy:     Cervical: No cervical adenopathy.  Skin:    General: Skin  is warm and dry.     Findings: No rash.  Neurological:     Mental Status: She is alert.     Cranial Nerves: No cranial nerve deficit.     Sensory: No sensory deficit.     Comments: MMSE 25-27/30  Psychiatric:        Mood and Affect: Mood is not anxious or depressed.        Speech: Speech normal.        Behavior: Behavior normal. Behavior is cooperative.        Judgment: Judgment normal.      Assessment and Plan The patient's preventative maintenance and recommended screening tests for an annual wellness exam were reviewed in full today. Brought up to date unless services declined.  Counselled on the importance of diet, exercise, and its role in overall health and mortality. The patient's FH and SH was reviewed, including their home life, tobacco status, and drug and alcohol status.   Vaccines:uptodate Pap/DVE:not indicated Mammo:neg 02/2018 Bone Density:2011, on fosamax for 10 years, will stopped in 2013. Remained stable on 2015 DEXA, worse on DEXA 2017.Marland Kitchen Restarted fosamax. Due for re-eval. Colon:nml 2010 no repeat needed Smoking Status: none    Wt Readings from Last 3 Encounters:  01/12/19 114 lb 8 oz (51.9 kg)  09/07/18 113 lb (51.3 kg)  07/06/18 107 lb (48.5 kg)   Mild dementia (HCC) Trial of aricept.  Vitamin D deficiency Resolved with  suplement  Essential hypertension, benign Well controlled. Continue current medication.   Iron deficiency anemia  Resolved on iron... high risk for bleed  Osteoporosis Due for re-eval ofd DEXa on fosamax  Mild malnutrition ([place]) Improved.  Chronic insomnia  Stable control with trazodone  DVT (deep venous thrombosis) (HCC)  Resolved.. now off eliquis  [  Eliezer Lofts, MD

## 2019-01-12 NOTE — Assessment & Plan Note (Signed)
Stable control with trazodone

## 2019-01-12 NOTE — Assessment & Plan Note (Signed)
Resolved with suplement

## 2019-01-12 NOTE — Assessment & Plan Note (Signed)
Improved

## 2019-01-12 NOTE — Progress Notes (Signed)
No critical labs need to be addressed urgently. We will discuss labs in detail at upcoming office visit.   

## 2019-02-21 DIAGNOSIS — H401132 Primary open-angle glaucoma, bilateral, moderate stage: Secondary | ICD-10-CM | POA: Diagnosis not present

## 2019-02-22 DIAGNOSIS — L814 Other melanin hyperpigmentation: Secondary | ICD-10-CM | POA: Diagnosis not present

## 2019-02-22 DIAGNOSIS — L821 Other seborrheic keratosis: Secondary | ICD-10-CM | POA: Diagnosis not present

## 2019-02-22 DIAGNOSIS — Z85828 Personal history of other malignant neoplasm of skin: Secondary | ICD-10-CM | POA: Diagnosis not present

## 2019-03-01 DIAGNOSIS — H401132 Primary open-angle glaucoma, bilateral, moderate stage: Secondary | ICD-10-CM | POA: Diagnosis not present

## 2019-03-15 ENCOUNTER — Other Ambulatory Visit: Payer: Self-pay

## 2019-03-15 ENCOUNTER — Ambulatory Visit
Admission: RE | Admit: 2019-03-15 | Discharge: 2019-03-15 | Disposition: A | Discharge status: Home / Self Care | Payer: Medicare Other | Source: Ambulatory Visit | Attending: Family Medicine | Admitting: Family Medicine

## 2019-03-15 DIAGNOSIS — Z1231 Encounter for screening mammogram for malignant neoplasm of breast: Secondary | ICD-10-CM

## 2019-03-25 ENCOUNTER — Other Ambulatory Visit: Payer: Self-pay | Admitting: Family Medicine

## 2019-03-26 ENCOUNTER — Other Ambulatory Visit: Payer: Self-pay | Admitting: Family Medicine

## 2019-03-26 NOTE — Telephone Encounter (Signed)
Last office visit 01/12/2019 for CPE.  Last refilled 10/13/2018 for #30 with no refills.  Next Appt: 07/10/2019 to follow up dementia.

## 2019-04-02 ENCOUNTER — Ambulatory Visit: Payer: Medicare Other | Attending: Internal Medicine

## 2019-04-02 DIAGNOSIS — Z20822 Contact with and (suspected) exposure to covid-19: Secondary | ICD-10-CM | POA: Diagnosis not present

## 2019-04-03 LAB — NOVEL CORONAVIRUS, NAA: SARS-CoV-2, NAA: DETECTED — AB

## 2019-04-04 ENCOUNTER — Telehealth: Payer: Self-pay | Admitting: Nurse Practitioner

## 2019-04-04 NOTE — Telephone Encounter (Signed)
Called to Discuss with patient about Covid symptoms and the use of bamlanivimab, a monoclonal antibody infusion for those with mild to moderate Covid symptoms and at a high risk of hospitalization.     Pt will be placed on cancellation list for tomorrow. Symptoms started 03/27/19

## 2019-04-05 ENCOUNTER — Other Ambulatory Visit (HOSPITAL_COMMUNITY): Payer: Self-pay | Admitting: Physician Assistant

## 2019-04-05 ENCOUNTER — Ambulatory Visit (HOSPITAL_COMMUNITY)
Admission: RE | Admit: 2019-04-05 | Discharge: 2019-04-05 | Disposition: A | Discharge status: Home / Self Care | Payer: Medicare Other | Source: Ambulatory Visit | Attending: Pulmonary Disease | Admitting: Pulmonary Disease

## 2019-04-05 DIAGNOSIS — U071 COVID-19: Secondary | ICD-10-CM | POA: Insufficient documentation

## 2019-04-05 DIAGNOSIS — Z23 Encounter for immunization: Secondary | ICD-10-CM | POA: Diagnosis not present

## 2019-04-05 DIAGNOSIS — I10 Essential (primary) hypertension: Secondary | ICD-10-CM

## 2019-04-05 DIAGNOSIS — D508 Other iron deficiency anemias: Secondary | ICD-10-CM

## 2019-04-05 MED ORDER — ALBUTEROL SULFATE HFA 108 (90 BASE) MCG/ACT IN AERS: 2.0000 | INHALATION_SPRAY | Freq: Once | RESPIRATORY_TRACT | Status: DC | PRN

## 2019-04-05 MED ORDER — METHYLPREDNISOLONE SODIUM SUCC 125 MG IJ SOLR: 125.0000 mg | Freq: Once | INTRAMUSCULAR | Status: DC | PRN

## 2019-04-05 MED ORDER — FAMOTIDINE IN NACL 20-0.9 MG/50ML-% IV SOLN: 20.0000 mg | Freq: Once | INTRAVENOUS | Status: DC | PRN

## 2019-04-05 MED ORDER — DIPHENHYDRAMINE HCL 50 MG/ML IJ SOLN: 50.0000 mg | Freq: Once | INTRAMUSCULAR | Status: DC | PRN

## 2019-04-05 MED ORDER — SODIUM CHLORIDE 0.9 % IV SOLN
700.0000 mg | Freq: Once | INTRAVENOUS | Status: AC
  Administered 2019-04-05: 14:00:00 700 mg via INTRAVENOUS
  Filled 2019-04-05: qty 20

## 2019-04-05 MED ORDER — EPINEPHRINE 0.3 MG/0.3ML IJ SOAJ: 0.3000 mg | Freq: Once | INTRAMUSCULAR | Status: DC | PRN

## 2019-04-05 MED ORDER — SODIUM CHLORIDE 0.9 % IV SOLN: INTRAVENOUS | Status: DC | PRN

## 2019-04-05 NOTE — Discharge Instructions (Signed)

## 2019-04-17 ENCOUNTER — Other Ambulatory Visit: Payer: Medicare Other

## 2019-04-20 ENCOUNTER — Other Ambulatory Visit: Payer: Self-pay

## 2019-04-20 DIAGNOSIS — R3915 Urgency of urination: Secondary | ICD-10-CM | POA: Diagnosis not present

## 2019-04-20 DIAGNOSIS — N819 Female genital prolapse, unspecified: Secondary | ICD-10-CM | POA: Diagnosis not present

## 2019-05-11 ENCOUNTER — Telehealth (INDEPENDENT_AMBULATORY_CARE_PROVIDER_SITE_OTHER): Payer: Medicare Other | Admitting: Family Medicine

## 2019-05-11 ENCOUNTER — Encounter: Payer: Self-pay | Admitting: Family Medicine

## 2019-05-11 ENCOUNTER — Other Ambulatory Visit: Payer: Self-pay

## 2019-05-11 DIAGNOSIS — R059 Cough, unspecified: Secondary | ICD-10-CM | POA: Insufficient documentation

## 2019-05-11 DIAGNOSIS — R05 Cough: Secondary | ICD-10-CM | POA: Diagnosis not present

## 2019-05-11 MED ORDER — AZITHROMYCIN 250 MG PO TABS: ORAL_TABLET | ORAL | 0 refills | Status: DC

## 2019-05-11 MED ORDER — BENZONATATE 200 MG PO CAPS: 200.0000 mg | ORAL_CAPSULE | Freq: Two times a day (BID) | ORAL | 0 refills | Status: DC | PRN

## 2019-05-11 NOTE — Patient Instructions (Signed)
Concern for possible bacterial superinfection as COVID infection was improving and now cough has worsened. No red flags for cardiac disease, no SOB.  COVID re-infeciton not likely given < 90 days from last positive test. No testing indicated.  Start antibiotics and cough suppressant, but given age and frailty.. have low threshold for in person exam. If worsening over weekend got to urgent care, if severe shortness of breath.. go to ER. If not worsening but not improving by Monday..  Call to consider [facility] for lung exam and CXR

## 2019-05-11 NOTE — Assessment & Plan Note (Signed)
Concern for possible bacterial superinfection as COVID infection was improving and now cough has worsened. No red flags for cardiac disease, no SOB.  COVID re-infeciton not likely given < 90 days from last positive test. No testing indicated.  Start antibiotics and cough suppressant, but given age and frailty.. have low threshold for in person exam. If worsening over weekend got to urgent care, if severe shortness of breath.. go to ER. If not worsening but not improving by Monday..  Call to consider [facility] for lung exam and CXR

## 2019-05-11 NOTE — Progress Notes (Signed)
VIRTUAL VISIT Due to national recommendations of social distancing due to [place] 19, a virtual visit is felt to be most appropriate for this patient at this time.   I connected with the patient on 05/11/19 at  9:40 AM EST by virtual telehealth platform and verified that I am speaking with the correct person using two identifiers.   I discussed the limitations, risks, security and privacy concerns of performing an evaluation and management service by  virtual telehealth platform and the availability of in person appointments. I also discussed with the patient that there may be a patient responsible charge related to this service. The patient expressed understanding and agreed to proceed.  Patient location: Home Provider Location: [facility] Participants: Eliezer Lofts and [place]   Chief Complaint  Patient presents with  . Cough    ongoing worsened in the past 1 week.... productive white--yellow... denies SOB or difficulty breathing... pt has tried OTC Mucinex, Robitussin and alka seltzer    History of Present Illness:  86 year old female with CAD, cryptogenic cirrhosis, history of DVTpresents for  new onset cough.   History provided by  Daughter.   Positive COVID test in 04/05/2019  Symptoms started with loss of taste and smell, dry cough, fatigue, headache., low  Grade temperature. Started to improve.  Now in last week... cough has increased, increased mucus production. White to yellow sputum Cough is continuous.  No SOB, no wheeze. No CP, no swelling in ankles.   No fever.  Still some what tired.   Minimal improvement with mucinex, alkaseltzer and robitussin.  She is a nonsmoker, no chronic lung issues.     COVID 19 screen No recent travel or known exposure to COVID19 The patient denies respiratory symptoms of COVID 19 at this time.  The importance of social distancing was discussed today.   Review of Systems  Constitutional: Negative for chills and  fever.  HENT: Negative for congestion and ear pain.   Eyes: Negative for pain and redness.  Respiratory: Negative for cough and shortness of breath.   Cardiovascular: Negative for chest pain, palpitations and leg swelling.  Gastrointestinal: Negative for abdominal pain, blood in stool, constipation, diarrhea, nausea and vomiting.  Genitourinary: Negative for dysuria.  Musculoskeletal: Negative for falls and myalgias.  Skin: Negative for rash.  Neurological: Negative for dizziness.  Psychiatric/Behavioral: Negative for depression. The patient is not nervous/anxious.       Past Medical History:  Diagnosis Date  . Arthritis   . Choledocholithiasis   . Cirrhosis, cryptogenic (HCC)    FOLLOWED BY DR PYRTLE-- LOV  IN EPIC--  STABLE PER NOTE  . Common bile duct stone   . Cryptogenic cirrhosis ([place])   . DDD (degenerative disc disease), lumbar   . Diverticulosis   . Esophageal varix ([place])   . GERD (gastroesophageal reflux disease)   . Hiatal hernia   . History of colonoscopy with polypectomy    ADENOMATOUS AND HYPERPLASIA POLYPS  (2008  &  2010)  . History of hepatitis C   . History of positive PPD    + TB SKIN TEST WITHOUT TB  . Hyperlipidemia   . Hypertension, portal ([place])    SECONDARY TO CRYPTOGENIC CIRRHOSIS  . Internal hemorrhoids   . Iron deficiency anemia   . Loss of hearing    right ear  . Osteoarthritis    hands, knees, and low back  . Osteoporosis   . Portal hypertension ([place])   . PUD (peptic ulcer  disease)   . Tubular adenoma of colon   . Uterovaginal prolapse, incomplete   . Wears glasses   . Wears hearing aid    left ear only    reports that she has never smoked. She has never used smokeless tobacco. She reports that she does not drink alcohol or use drugs.   Current Outpatient Medications:  .  alendronate (FOSAMAX) 70 MG tablet, TAKE 1 TABLET BY MOUTH ONCE A WEEK. TAKE WITH A FULL GLASS OF WATER ON AN EMPTY STOMACH., Disp: 12 tablet, Rfl: 3 .   Calcium-Phosphorus-Vitamin D (CALCIUM GUMMIES PO), Take 1 tablet by mouth daily., Disp: , Rfl:  .  diclofenac sodium (VOLTAREN) 1 % GEL, Apply 4 g topically 4 (four) times daily. (Patient taking differently: Apply 4 g topically 4 (four) times daily as needed. ), Disp: 100 g, Rfl: 11 .  Iron Combinations (IRON COMPLEX PO), Take 1 tablet by mouth daily., Disp: , Rfl:  .  lidocaine (LIDODERM) 5 %, Place 1 patch onto the skin daily. Remove & Discard patch within 12 hours or as directed by MD, Disp: 30 patch, Rfl: 0 .  pantoprazole (PROTONIX) 40 MG tablet, TAKE 1 TABLET BY MOUTH DAILY BEFORE BREAKFAST, Disp: 90 tablet, Rfl: 3 .  sennosides-docusate sodium (SENOKOT-S) 8.6-50 MG tablet, Take 1 tablet by mouth daily as needed for constipation., Disp: , Rfl:  .  simethicone (MYLICON) 0000000 MG chewable tablet, Chew 125 mg by mouth every 6 (six) hours as needed for flatulence., Disp: , Rfl:  .  timolol (TIMOPTIC) 0.5 % ophthalmic solution, Place 1 drop into both eyes daily. , Disp: , Rfl:  .  traMADol (ULTRAM) 50 MG tablet, TAKE ONE-HALF TO ONE TABLET BY MOUTH UP TO EVERY 12 HOURS AS NEEDED FOR MODERATE PAIN., Disp: 30 tablet, Rfl: 0 .  traZODone (DESYREL) 150 MG tablet, TAKE 1 TABLET BY MOUTH EVERYDAY AT BEDTIME, Disp: 90 tablet, Rfl: 0 .  vitamin E 400 UNIT capsule, Take 400 Units by mouth daily., Disp: , Rfl:    Observations/Objective: There were no vitals taken for this visit.  Physical Exam  Physical Exam Constitutional:      General: The patient is not in acute distress. Pulmonary:     Effort: Pulmonary effort is normal. No respiratory distress.  Neurological:     Mental Status: The patient is alert and oriented to person, place, and time.  Psychiatric:        Mood and Affect: Mood normal.        Behavior: Behavior normal.   Assessment and Plan    I discussed the assessment and treatment plan with the patient. The patient was provided an opportunity to ask questions and all were answered. The  patient agreed with the plan and demonstrated an understanding of the instructions.   The patient was advised to call back or seek an in-person evaluation if the symptoms worsen or if the condition fails to improve as anticipated.   Cough Concern for possible bacterial superinfection as COVID infection was improving and now cough has worsened. No red flags for cardiac disease, no SOB.  COVID re-infeciton not likely given < 90 days from last positive test. No testing indicated.  Start antibiotics and cough suppressant, but given age and frailty.. have low threshold for in person exam. If worsening over weekend got to urgent care, if severe shortness of breath.. go to ER. If not worsening but not improving by Monday..  Call to consider [facility] for lung exam  and CXR     Eliezer Lofts, MD

## 2019-05-16 ENCOUNTER — Other Ambulatory Visit (INDEPENDENT_AMBULATORY_CARE_PROVIDER_SITE_OTHER): Payer: Medicare Other

## 2019-05-16 DIAGNOSIS — D500 Iron deficiency anemia secondary to blood loss (chronic): Secondary | ICD-10-CM

## 2019-05-16 DIAGNOSIS — K7469 Other cirrhosis of liver: Secondary | ICD-10-CM

## 2019-05-16 LAB — IBC PANEL
Iron: 80 ug/dL (ref 42–145)
Saturation Ratios: 28.4 % (ref 20.0–50.0)
Transferrin: 201 mg/dL — ABNORMAL LOW (ref 212.0–360.0)

## 2019-05-16 LAB — FOLATE: Folate: 10.5 ng/mL (ref >5.9)

## 2019-05-16 LAB — COMPREHENSIVE METABOLIC PANEL
ALT: 19 U/L (ref 0–35)
AST: 34 U/L (ref 0–37)
Albumin: 3.5 g/dL (ref 3.5–5.2)
Alkaline Phosphatase: 81 U/L (ref 39–117)
BUN: 7 mg/dL (ref 6–23)
CO2: 29 mEq/L (ref 19–32)
Calcium: 8.5 mg/dL (ref 8.4–10.5)
Chloride: 105 mEq/L (ref 96–112)
Creatinine, Ser: 0.61 mg/dL (ref 0.40–1.20)
GFR: 92.48 mL/min (ref >60.00)
Glucose, Bld: 109 mg/dL — ABNORMAL HIGH (ref 70–99)
Potassium: 3.8 mEq/L (ref 3.5–5.1)
Sodium: 139 mEq/L (ref 135–145)
Total Bilirubin: 0.8 mg/dL (ref 0.2–1.2)
Total Protein: 6 g/dL (ref 6.0–8.3)

## 2019-05-16 LAB — CBC WITH DIFFERENTIAL/PLATELET
Basophils Absolute: 0 10*3/uL (ref 0.0–0.1)
Basophils Relative: 0.8 % (ref 0.0–3.0)
Eosinophils Absolute: 0.2 10*3/uL (ref 0.0–0.7)
Eosinophils Relative: 4.2 % (ref 0.0–5.0)
HCT: 37 % (ref 36.0–46.0)
Hemoglobin: 12.6 g/dL (ref 12.0–15.0)
Lymphocytes Relative: 26.9 % (ref 12.0–46.0)
Lymphs Abs: 1.3 10*3/uL (ref 0.7–4.0)
MCHC: 34 g/dL (ref 30.0–36.0)
MCV: 91.6 fl (ref 78.0–100.0)
Monocytes Absolute: 0.6 10*3/uL (ref 0.1–1.0)
Monocytes Relative: 12.7 % — ABNORMAL HIGH (ref 3.0–12.0)
Neutro Abs: 2.8 10*3/uL (ref 1.4–7.7)
Neutrophils Relative %: 55.4 % (ref 43.0–77.0)
Platelets: 199 10*3/uL (ref 150.0–400.0)
RBC: 4.05 Mil/uL (ref 3.87–5.11)
RDW: 14.1 % (ref 11.5–15.5)
WBC: 5 10*3/uL (ref 4.0–10.5)

## 2019-05-16 LAB — FERRITIN: Ferritin: 59.8 ng/mL (ref 10.0–291.0)

## 2019-05-16 LAB — PROTIME-INR
INR: 1.1 ratio — ABNORMAL HIGH (ref 0.8–1.0)
Prothrombin Time: 12.5 s (ref 9.6–13.1)

## 2019-05-17 ENCOUNTER — Other Ambulatory Visit: Payer: Self-pay

## 2019-05-17 DIAGNOSIS — D500 Iron deficiency anemia secondary to blood loss (chronic): Secondary | ICD-10-CM

## 2019-05-28 ENCOUNTER — Telehealth: Payer: Self-pay | Admitting: Family Medicine

## 2019-05-28 NOTE — Progress Notes (Signed)
°  Chronic Care Management   Outreach Note  05/28/2019 Name: Brianna Barnes MRN: [id number] DOB: 02-07-1929  Referred by: Jinny Sanders, MD Reason for referral : No chief complaint on file.   An unsuccessful telephone outreach was attempted today. The patient was referred to the pharmacist for assistance with care management and care coordination.   Follow Up Plan:   [name] [employer] Scheduler

## 2019-06-03 ENCOUNTER — Other Ambulatory Visit: Payer: Self-pay | Admitting: Family Medicine

## 2019-06-28 ENCOUNTER — Ambulatory Visit: Payer: Medicare Other | Attending: Internal Medicine

## 2019-06-28 DIAGNOSIS — Z23 Encounter for immunization: Secondary | ICD-10-CM

## 2019-06-28 NOTE — Progress Notes (Signed)
   Covid-19 Vaccination Clinic  Name:  Brianna Barnes    MRN: [id number] DOB: [birth year]  06/28/2019  [name] was observed post Covid-19 immunization for 15 minutes without incident. She was provided with Vaccine Information Sheet and instruction to access the V-Safe system.   [name] was instructed to call 911 with any severe reactions post vaccine: Marland Kitchen Difficulty breathing  . Swelling of face and throat  . A fast heartbeat  . A bad rash all over body  . Dizziness and weakness   Immunizations Administered    Name Date Dose VIS Date Route   Pfizer COVID-19 Vaccine 06/28/2019 10:01 AM 0.3 mL 02/23/2019 Intramuscular   Manufacturer: [employer], [employer]   Lot: [id number]   [place]: [id number]

## 2019-07-03 ENCOUNTER — Other Ambulatory Visit: Payer: Self-pay

## 2019-07-03 ENCOUNTER — Ambulatory Visit
Admission: RE | Admit: 2019-07-03 | Discharge: 2019-07-03 | Disposition: A | Discharge status: Home / Self Care | Payer: Medicare Other | Source: Ambulatory Visit | Attending: Family Medicine | Admitting: Family Medicine

## 2019-07-03 DIAGNOSIS — Z780 Asymptomatic menopausal state: Secondary | ICD-10-CM | POA: Diagnosis not present

## 2019-07-03 DIAGNOSIS — M810 Age-related osteoporosis without current pathological fracture: Secondary | ICD-10-CM

## 2019-07-10 ENCOUNTER — Ambulatory Visit (INDEPENDENT_AMBULATORY_CARE_PROVIDER_SITE_OTHER): Payer: Medicare Other | Admitting: Family Medicine

## 2019-07-10 ENCOUNTER — Other Ambulatory Visit: Payer: Self-pay

## 2019-07-10 ENCOUNTER — Encounter: Payer: Self-pay | Admitting: Family Medicine

## 2019-07-10 VITALS — BP 142/76 | HR 77 | Temp 97.3°F | Wt 113.8 lb

## 2019-07-10 DIAGNOSIS — J309 Allergic rhinitis, unspecified: Secondary | ICD-10-CM

## 2019-07-10 DIAGNOSIS — F0390 Unspecified dementia without behavioral disturbance: Secondary | ICD-10-CM | POA: Diagnosis not present

## 2019-07-10 DIAGNOSIS — F03A0 Unspecified dementia, mild, without behavioral disturbance, psychotic disturbance, mood disturbance, and anxiety: Secondary | ICD-10-CM

## 2019-07-10 MED ORDER — MEMANTINE HCL 5 MG PO TABS: ORAL_TABLET | ORAL | 0 refills | Status: DC

## 2019-07-10 NOTE — Progress Notes (Signed)
Chief Complaint  Patient presents with  . 6 mos FU dementia    History of Present Illness: HPI  86 year old female 1 month follow up dementia  Dx with mild dementia in 12/2018 at [facility].  MMSE  Was 25-27/30. She was started on donezepil 5 mg daily.   Had immediate nausea.. was unable to take it   Today she presents with her family ( daughter) to reassess her memory.  She has continued to note a slow decline of memory.. forgetting phone calls and appointments. She does have very poor hearing and this is worsening in last few months. Has a hearing aid.. completely deaf in right ear.     This visit occurred during the SARS-CoV-2 public health emergency.  Safety protocols were in place, including screening questions prior to the visit, additional usage of staff PPE, and extensive cleaning of exam room while observing appropriate contact time as indicated for disinfecting solutions.   COVID 19 screen:  No recent travel or known exposure to COVID19 The patient denies respiratory symptoms of COVID 19 at this time. The importance of social distancing was discussed today.     Review of Systems  Constitutional: Negative for chills and fever.  HENT: Negative for congestion and ear pain.   Eyes: Negative for pain and redness.  Respiratory: Negative for cough and shortness of breath.   Cardiovascular: Negative for chest pain, palpitations and leg swelling.  Gastrointestinal: Negative for abdominal pain, blood in stool, constipation, diarrhea, nausea and vomiting.  Genitourinary: Negative for dysuria.  Musculoskeletal: Negative for falls and myalgias.  Skin: Negative for rash.  Neurological: Negative for dizziness.  Psychiatric/Behavioral: Negative for depression. The patient is not nervous/anxious.       Past Medical History:  Diagnosis Date  . Arthritis   . Choledocholithiasis   . Cirrhosis, cryptogenic (HCC)    FOLLOWED BY DR PYRTLE-- LOV  IN EPIC--  STABLE PER NOTE  . Common  bile duct stone   . Cryptogenic cirrhosis ([place])   . DDD (degenerative disc disease), lumbar   . Diverticulosis   . Esophageal varix ([place])   . GERD (gastroesophageal reflux disease)   . Hiatal hernia   . History of colonoscopy with polypectomy    ADENOMATOUS AND HYPERPLASIA POLYPS  (2008  &  2010)  . History of hepatitis C   . History of positive PPD    + TB SKIN TEST WITHOUT TB  . Hyperlipidemia   . Hypertension, portal ([place])    SECONDARY TO CRYPTOGENIC CIRRHOSIS  . Internal hemorrhoids   . Iron deficiency anemia   . Loss of hearing    right ear  . Osteoarthritis    hands, knees, and low back  . Osteoporosis   . Portal hypertension ([place])   . PUD (peptic ulcer disease)   . Tubular adenoma of colon   . Uterovaginal prolapse, incomplete   . Wears glasses   . Wears hearing aid    left ear only    reports that she has never smoked. She has never used smokeless tobacco. She reports that she does not drink alcohol or use drugs.   Current Outpatient Medications:  .  alendronate (FOSAMAX) 70 MG tablet, TAKE 1 TABLET BY MOUTH ONCE A WEEK. TAKE WITH A FULL GLASS OF WATER ON AN EMPTY STOMACH., Disp: 12 tablet, Rfl: 3 .  Calcium-Phosphorus-Vitamin D (CALCIUM GUMMIES PO), Take 1 tablet by mouth daily., Disp: , Rfl:  .  diclofenac sodium (VOLTAREN) 1 % GEL, Apply  4 g topically 4 (four) times daily. (Patient taking differently: Apply 4 g topically 4 (four) times daily as needed. ), Disp: 100 g, Rfl: 11 .  Iron Combinations (IRON COMPLEX PO), Take 1 tablet by mouth daily., Disp: , Rfl:  .  lidocaine (LIDODERM) 5 %, Place 1 patch onto the skin daily. Remove & Discard patch within 12 hours or as directed by MD, Disp: 30 patch, Rfl: 0 .  pantoprazole (PROTONIX) 40 MG tablet, TAKE 1 TABLET BY MOUTH DAILY BEFORE BREAKFAST, Disp: 90 tablet, Rfl: 3 .  sennosides-docusate sodium (SENOKOT-S) 8.6-50 MG tablet, Take 1 tablet by mouth daily as needed for constipation., Disp: , Rfl:  .  simethicone  (MYLICON) 0000000 MG chewable tablet, Chew 125 mg by mouth every 6 (six) hours as needed for flatulence., Disp: , Rfl:  .  timolol (TIMOPTIC) 0.5 % ophthalmic solution, Place 1 drop into both eyes 2 (two) times daily. , Disp: , Rfl:  .  traMADol (ULTRAM) 50 MG tablet, TAKE ONE-HALF TO ONE TABLET BY MOUTH UP TO EVERY 12 HOURS AS NEEDED FOR MODERATE PAIN., Disp: 30 tablet, Rfl: 0 .  traZODone (DESYREL) 150 MG tablet, TAKE 1 TABLET BY MOUTH EVERYDAY AT BEDTIME, Disp: 90 tablet, Rfl: 1 .  vitamin E 400 UNIT capsule, Take 400 Units by mouth daily., Disp: , Rfl:    Observations/Objective: Blood pressure (!) 142/76, pulse 77, temperature (!) 97.3 F (36.3 C), temperature source Temporal, weight 113 lb 12 oz (51.6 kg), SpO2 98 %.  Physical Exam Constitutional:      General: She is not in acute distress.    Appearance: Normal appearance. She is well-developed. She is not ill-appearing or toxic-appearing.  HENT:     Head: Normocephalic.     Right Ear: Hearing, tympanic membrane, ear canal and external ear normal. Tympanic membrane is not erythematous, retracted or bulging.     Left Ear: Hearing, tympanic membrane, ear canal and external ear normal. Tympanic membrane is not erythematous, retracted or bulging.     Nose: No mucosal edema or rhinorrhea.     Right Sinus: No maxillary sinus tenderness or frontal sinus tenderness.     Left Sinus: No maxillary sinus tenderness or frontal sinus tenderness.     Mouth/Throat:     Pharynx: Uvula midline.  Eyes:     General: Lids are normal. Lids are everted, no foreign bodies appreciated.     Conjunctiva/sclera: Conjunctivae normal.     Pupils: Pupils are equal, round, and reactive to light.  Neck:     Thyroid: No thyroid mass or thyromegaly.     Vascular: No carotid bruit.     Trachea: Trachea normal.  Cardiovascular:     Rate and Rhythm: Normal rate and regular rhythm.     Pulses: Normal pulses.     Heart sounds: Normal heart sounds, S1 normal and S2  normal. No murmur. No friction rub. No gallop.   Pulmonary:     Effort: Pulmonary effort is normal. No tachypnea or respiratory distress.     Breath sounds: Normal breath sounds. No decreased breath sounds, wheezing, rhonchi or rales.  Abdominal:     General: Bowel sounds are normal.     Palpations: Abdomen is soft.     Tenderness: There is no abdominal tenderness.  Musculoskeletal:     Cervical back: Normal range of motion and neck supple.  Skin:    General: Skin is warm and dry.     Findings: No rash.  Neurological:  Mental Status: She is alert. Mental status is at baseline.  Psychiatric:        Mood and Affect: Mood is not anxious or depressed.        Speech: Speech normal.        Behavior: Behavior normal. Behavior is cooperative.        Thought Content: Thought content normal.        Judgment: Judgment normal.      Assessment and Plan Allergic rhinitis Trial of flonase  Mild dementia ([place]) Start namenda daily for memory.        Eliezer Lofts, MD

## 2019-07-10 NOTE — Patient Instructions (Addendum)
Start namenda daily for memory.   Can try 2 spray per nostril daily of flonase.

## 2019-07-16 NOTE — Telephone Encounter (Signed)
A user error has taken place: encounter opened in error, closed for administrative reasons.

## 2019-07-19 ENCOUNTER — Telehealth: Payer: Self-pay

## 2019-07-19 DIAGNOSIS — M810 Age-related osteoporosis without current pathological fracture: Secondary | ICD-10-CM

## 2019-07-19 NOTE — Telephone Encounter (Signed)
Pt has [employer] Medicare.  No PA rqd.  Pt would owe approx. $240 (20% for Prolia).   No admin fee.

## 2019-07-20 NOTE — Telephone Encounter (Signed)
Contacted pt's daughter, Neoma Laming, and advised cost would be $240. She reports her husband checked online and it said $9. Discussed with Charmaine, referrals, and she has print out of insurance details. Neoma Laming will do further investigation and f/u with this nurse.

## 2019-07-24 ENCOUNTER — Ambulatory Visit: Payer: Medicare Other | Attending: Internal Medicine

## 2019-07-24 DIAGNOSIS — Z23 Encounter for immunization: Secondary | ICD-10-CM

## 2019-07-24 NOTE — Progress Notes (Signed)
   Covid-19 Vaccination Clinic  Name:  Brianna Barnes    MRN: [id number] DOB: 10/19/1928  07/24/2019  [name] was observed post Covid-19 immunization for 15 minutes without incident. She was provided with Vaccine Information Sheet and instruction to access the V-Safe system.   [name] was instructed to call 911 with any severe reactions post vaccine: Marland Kitchen Difficulty breathing  . Swelling of face and throat  . A fast heartbeat  . A bad rash all over body  . Dizziness and weakness   Immunizations Administered    Name Date Dose VIS Date Route   Pfizer COVID-19 Vaccine 07/24/2019  9:55 AM 0.3 mL 05/09/2018 Intramuscular   Manufacturer: [place]   Lot: [id number]   [place]: [id number]

## 2019-07-27 NOTE — Addendum Note (Signed)
Addended by: Randall An on: 07/27/2019 10:11 AM   Modules accepted: Orders

## 2019-07-27 NOTE — Telephone Encounter (Signed)
Contacted Brianna Barnes and she reports she has not found any additional info online. She requested to schedule the lab and the discuss the inj if everything was normal. Scheduled lab for 5/19. Will f/u with Debra with results. Added BMP order

## 2019-08-01 ENCOUNTER — Other Ambulatory Visit (INDEPENDENT_AMBULATORY_CARE_PROVIDER_SITE_OTHER): Payer: Medicare Other

## 2019-08-01 DIAGNOSIS — M810 Age-related osteoporosis without current pathological fracture: Secondary | ICD-10-CM | POA: Diagnosis not present

## 2019-08-01 LAB — BASIC METABOLIC PANEL
BUN: 9 mg/dL (ref 6–23)
CO2: 29 mEq/L (ref 19–32)
Calcium: 8.6 mg/dL (ref 8.4–10.5)
Chloride: 106 mEq/L (ref 96–112)
Creatinine, Ser: 0.6 mg/dL (ref 0.40–1.20)
GFR: 94.22 mL/min (ref >60.00)
Glucose, Bld: 103 mg/dL — ABNORMAL HIGH (ref 70–99)
Potassium: 3.7 mEq/L (ref 3.5–5.1)
Sodium: 140 mEq/L (ref 135–145)

## 2019-08-02 NOTE — Telephone Encounter (Signed)
Left VM for prolia rep

## 2019-08-02 NOTE — Telephone Encounter (Signed)
I have looked for that answer... I am sorry but I cannot find any info on this. Can you contact the Prolia rep and ask them?

## 2019-08-02 NOTE — Telephone Encounter (Signed)
Per Prolia rep prolia and fosamax use two different mechanisms of action. Fosamax works on the bone and prolia works on the cellular level. So there is no interaction and he reports prolia can be give as few as one day after taking fosamax.

## 2019-08-02 NOTE — Telephone Encounter (Signed)
Ca is normal and CrCl is 52.69mL/min.  Contacted Neoma Laming and she reports pt would like to get prolia inj. She reports pt is still taking fosamax and her last dose was 5/15. Scheduled pt for 5/27 and advised for pt not to take fosamax dose on 5/22 and that a msg would be sent to Dr. Diona Browner to see if that is enough time to stop it and then get the prolia inj. Advised this nurse would f/u next week. Neoma Laming verbalized understanding.

## 2019-08-03 ENCOUNTER — Other Ambulatory Visit: Payer: Self-pay | Admitting: Family Medicine

## 2019-08-07 NOTE — Telephone Encounter (Signed)
Per Dr. Diona Browner, pt should stop fosamax and continue with prolia inj.  Advised pt's daughter. She verbalized understanding.

## 2019-08-09 ENCOUNTER — Ambulatory Visit (INDEPENDENT_AMBULATORY_CARE_PROVIDER_SITE_OTHER): Payer: Medicare Other

## 2019-08-09 DIAGNOSIS — M810 Age-related osteoporosis without current pathological fracture: Secondary | ICD-10-CM | POA: Diagnosis not present

## 2019-08-09 MED ORDER — DENOSUMAB 60 MG/ML ~~LOC~~ SOSY
60.0000 mg | PREFILLED_SYRINGE | Freq: Once | SUBCUTANEOUS | Status: AC
  Administered 2019-08-09: 60 mg via SUBCUTANEOUS

## 2019-08-14 NOTE — Progress Notes (Signed)
Reviewed and agree.  Eliezer Lofts, MD [place] [facility]

## 2019-08-22 DIAGNOSIS — J309 Allergic rhinitis, unspecified: Secondary | ICD-10-CM | POA: Insufficient documentation

## 2019-08-22 NOTE — Assessment & Plan Note (Signed)
Start namenda daily for memory.

## 2019-08-22 NOTE — Assessment & Plan Note (Signed)
Trial of flonase

## 2019-09-18 DIAGNOSIS — H401132 Primary open-angle glaucoma, bilateral, moderate stage: Secondary | ICD-10-CM | POA: Diagnosis not present

## 2019-10-19 DIAGNOSIS — N819 Female genital prolapse, unspecified: Secondary | ICD-10-CM | POA: Diagnosis not present

## 2019-10-25 ENCOUNTER — Ambulatory Visit: Payer: Medicare Other | Admitting: Dermatology

## 2019-10-25 ENCOUNTER — Other Ambulatory Visit: Payer: Self-pay

## 2019-10-25 DIAGNOSIS — L814 Other melanin hyperpigmentation: Secondary | ICD-10-CM

## 2019-10-25 DIAGNOSIS — Z1283 Encounter for screening for malignant neoplasm of skin: Secondary | ICD-10-CM | POA: Diagnosis not present

## 2019-10-25 DIAGNOSIS — D1800 Hemangioma unspecified site: Secondary | ICD-10-CM

## 2019-10-25 DIAGNOSIS — D485 Neoplasm of uncertain behavior of skin: Secondary | ICD-10-CM | POA: Diagnosis not present

## 2019-10-25 DIAGNOSIS — L821 Other seborrheic keratosis: Secondary | ICD-10-CM

## 2019-10-25 DIAGNOSIS — L570 Actinic keratosis: Secondary | ICD-10-CM

## 2019-10-25 DIAGNOSIS — Z85828 Personal history of other malignant neoplasm of skin: Secondary | ICD-10-CM

## 2019-10-25 DIAGNOSIS — L309 Dermatitis, unspecified: Secondary | ICD-10-CM | POA: Diagnosis not present

## 2019-10-25 DIAGNOSIS — D229 Melanocytic nevi, unspecified: Secondary | ICD-10-CM | POA: Diagnosis not present

## 2019-10-25 DIAGNOSIS — L578 Other skin changes due to chronic exposure to nonionizing radiation: Secondary | ICD-10-CM

## 2019-10-25 MED ORDER — MUPIROCIN 2 % EX OINT: 1.0000 "application " | TOPICAL_OINTMENT | Freq: Every day | CUTANEOUS | 0 refills | Status: DC

## 2019-10-25 NOTE — Progress Notes (Signed)
Follow-Up Visit   Subjective  Brianna Barnes is a 86 y.o. female who presents for the following: full body skin exam and skin cancer screening  Patient here today for TBSE. She has a history of SCC. There are 2 red spots on her forehead that have been there since her last visit that sometimes itch. Nothing else new or changing that she is aware of.  Patient accompanied by daughter.   The following portions of the chart were reviewed this encounter and updated as appropriate:  Tobacco  Allergies  Meds  Problems  Med Hx  Surg Hx  Fam Hx      Review of Systems:  No other skin or systemic complaints except as noted in HPI or Assessment and Plan.  Objective  Well appearing patient in no apparent distress; mood and affect are within normal limits.  A full examination was performed including scalp, head, eyes, ears, nose, lips, neck, chest, axillae, abdomen, back, buttocks, bilateral upper extremities, bilateral lower extremities, hands, feet, fingers, toes, fingernails, and toenails. All findings within normal limits unless otherwise noted below.  Objective  Mid Forehead x 4 (4): Erythematous thin papules/macules with gritty scale.   Objective  Right Shin: 0.6cm pink papule      Assessment & Plan  AK (actinic keratosis) (4) Mid Forehead x 4  Destruction of lesion - Mid Forehead x 4 Complexity: simple   Destruction method: cryotherapy   Informed consent: discussed and consent obtained   Lesion destroyed using liquid nitrogen: Yes   Cryotherapy cycles:  2 Outcome: patient tolerated procedure well with no complications   Post-procedure details: wound care instructions given    Neoplasm of uncertain behavior of skin Right Shin  Skin / nail biopsy Type of biopsy: tangential   Informed consent: discussed and consent obtained   Timeout: patient name, date of birth, surgical site, and procedure verified   Patient was prepped and draped in usual sterile fashion:  Area prepped with isopropyl alcohol. Anesthesia: the lesion was anesthetized in a standard fashion   Anesthetic:  1% lidocaine w/ epinephrine 1-100,000 buffered w/ 8.4% NaHCO3 Instrument used: flexible razor blade   Hemostasis achieved with: aluminum chloride   Outcome: patient tolerated procedure well   Post-procedure details: wound care instructions given   Additional details:  Mupirocin and a bandage applied  mupirocin ointment (BACTROBAN) 2 %  Specimen 1 - Surgical pathology Differential Diagnosis: r/o BCC over SCC over Amelanotic Check Margins: No 0.6cm pink papule  Start mupirocin daily with dressing change   History of Squamous Cell Carcinoma of the Skin - No evidence of recurrence today at left cheek - No lymphadenopathy - Recommend regular full body skin exams - Recommend daily broad spectrum sunscreen SPF 30+ to sun-exposed areas, reapply every 2 hours as needed.  - Call if any new or changing lesions are noted between office visits  Lentigines - Scattered tan macules - Discussed due to sun exposure - Benign, observe - Call for any changes  Seborrheic Keratoses - Stuck-on, waxy, tan-brown papules and plaques  - Discussed benign etiology and prognosis. - Observe - Call for any changes  Melanocytic Nevi - Tan-brown and/or pink-flesh-colored symmetric macules and papules - Benign appearing on exam today - Observation - Call clinic for new or changing moles - Recommend daily use of broad spectrum spf 30+ sunscreen to sun-exposed areas.   Hemangiomas - Red papules - Discussed benign nature - Observe - Call for any changes  Actinic Damage - diffuse scaly  erythematous macules with underlying dyspigmentation - Recommend daily broad spectrum sunscreen SPF 30+ to sun-exposed areas, reapply every 2 hours as needed.  - Call for new or changing lesions.  Skin cancer screening performed today.  Return in about 6 months (around 04/26/2020) for TBSE, AK follow  up.  Graciella Belton, RMA, am acting as scribe for Forest Gleason, MD .  Documentation: I have reviewed the above documentation for accuracy and completeness, and I agree with the above.  Forest Gleason, MD

## 2019-10-25 NOTE — Patient Instructions (Addendum)
Cryotherapy Aftercare  . Wash gently with soap and water everyday.   Marland Kitchen Apply Vaseline and Band-Aid daily until healed.  Prior to procedure, discussed risks of blister formation, small wound, skin dyspigmentation, or rare scar following cryotherapy.   Wound Care Instructions  1. Cleanse wound gently with soap and water once a day then pat dry with clean gauze. Apply a thing coat of Petrolatum (petroleum jelly, "Vaseline") over the wound (unless you have an allergy to this). We recommend that you use a new, sterile tube of Vaseline. Do not pick or remove scabs. Do not remove the yellow or white "healing tissue" from the base of the wound.  2. Cover the wound with fresh, clean, nonstick gauze and secure with paper tape. You may use Band-Aids in place of gauze and tape if the would is small enough, but would recommend trimming much of the tape off as there is often too much. Sometimes Band-Aids can irritate the skin.  3. You should call the office for your biopsy report after 1 week if you have not already been contacted.  4. If you experience any problems, such as abnormal amounts of bleeding, swelling, significant bruising, significant pain, or evidence of infection, please call the office immediately.  5. FOR ADULT SURGERY PATIENTS: If you need something for pain relief you may take 1 extra strength Tylenol (acetaminophen) AND 2 Ibuprofen (200mg  each) together every 4 hours as needed for pain. (do not take these if you are allergic to them or if you have a reason you should not take them.) Typically, you may only need pain medication for 1 to 3 days.   Recommend daily broad spectrum sunscreen SPF 30+ to sun-exposed areas, reapply every 2 hours as needed. Call for new or changing lesions.

## 2019-11-01 NOTE — Progress Notes (Signed)
Skin , right shin PERIVASCULAR DERMATITIS WITH EOSINOPHILS AND FOCAL SPONGIOSIS.   The biopsy shows some inflammation that can be seen with bug bites. There was no evidence of skin cancer. We will recheck this at follow-up. Please call the clinic if you have any concerns about the area.

## 2019-11-04 ENCOUNTER — Encounter: Payer: Self-pay | Admitting: Dermatology

## 2019-11-20 ENCOUNTER — Telehealth: Payer: Self-pay

## 2019-11-20 NOTE — Telephone Encounter (Signed)
Spoke with patient's daughter to remind them that patient is due for repeat labs, advised that no appt is necessary and that they can go by at their convenience between 7:30-5, Monday through Friday.

## 2019-11-20 NOTE — Telephone Encounter (Signed)
-----   Message from Algernon Huxley, RN sent at 05/17/2019  3:36 PM EST ----- Regarding: Labs Pt needs, labs, orders in epic.

## 2019-12-03 ENCOUNTER — Telehealth: Payer: Self-pay

## 2019-12-03 NOTE — Telephone Encounter (Signed)
Pending Verification on [employer]. Last injection 08/09/2019 Will need BMP

## 2019-12-18 NOTE — Telephone Encounter (Signed)
L/m to call office need to review cost and make appointments. Patient due for Prolia after 02/09/2020. Cost for patient is $250

## 2019-12-21 ENCOUNTER — Other Ambulatory Visit (INDEPENDENT_AMBULATORY_CARE_PROVIDER_SITE_OTHER): Payer: Medicare Other

## 2019-12-21 DIAGNOSIS — D500 Iron deficiency anemia secondary to blood loss (chronic): Secondary | ICD-10-CM

## 2019-12-21 LAB — PROTIME-INR
INR: 1.1 ratio — ABNORMAL HIGH (ref 0.8–1.0)
Prothrombin Time: 12 s (ref 9.6–13.1)

## 2019-12-21 LAB — COMPREHENSIVE METABOLIC PANEL
ALT: 19 U/L (ref 0–35)
AST: 33 U/L (ref 0–37)
Albumin: 3.6 g/dL (ref 3.5–5.2)
Alkaline Phosphatase: 72 U/L (ref 39–117)
BUN: 11 mg/dL (ref 6–23)
CO2: 28 mEq/L (ref 19–32)
Calcium: 8.6 mg/dL (ref 8.4–10.5)
Chloride: 107 mEq/L (ref 96–112)
Creatinine, Ser: 0.68 mg/dL (ref 0.40–1.20)
GFR: 77.55 mL/min (ref >60.00)
Glucose, Bld: 124 mg/dL — ABNORMAL HIGH (ref 70–99)
Potassium: 4.2 mEq/L (ref 3.5–5.1)
Sodium: 142 mEq/L (ref 135–145)
Total Bilirubin: 0.8 mg/dL (ref 0.2–1.2)
Total Protein: 5.9 g/dL — ABNORMAL LOW (ref 6.0–8.3)

## 2019-12-21 LAB — CBC WITH DIFFERENTIAL/PLATELET
Basophils Absolute: 0 10*3/uL (ref 0.0–0.1)
Basophils Relative: 0.7 % (ref 0.0–3.0)
Eosinophils Absolute: 0.1 10*3/uL (ref 0.0–0.7)
Eosinophils Relative: 2.4 % (ref 0.0–5.0)
HCT: 38.9 % (ref 36.0–46.0)
Hemoglobin: 13 g/dL (ref 12.0–15.0)
Lymphocytes Relative: 26.7 % (ref 12.0–46.0)
Lymphs Abs: 1.2 10*3/uL (ref 0.7–4.0)
MCHC: 33.4 g/dL (ref 30.0–36.0)
MCV: 92.6 fl (ref 78.0–100.0)
Monocytes Absolute: 0.5 10*3/uL (ref 0.1–1.0)
Monocytes Relative: 11.7 % (ref 3.0–12.0)
Neutro Abs: 2.6 10*3/uL (ref 1.4–7.7)
Neutrophils Relative %: 58.5 % (ref 43.0–77.0)
Platelets: 145 10*3/uL — ABNORMAL LOW (ref 150.0–400.0)
RBC: 4.2 Mil/uL (ref 3.87–5.11)
RDW: 13.9 % (ref 11.5–15.5)
WBC: 4.4 10*3/uL (ref 4.0–10.5)

## 2020-01-05 ENCOUNTER — Telehealth: Payer: Self-pay | Admitting: Family Medicine

## 2020-01-05 DIAGNOSIS — E782 Mixed hyperlipidemia: Secondary | ICD-10-CM

## 2020-01-05 DIAGNOSIS — E559 Vitamin D deficiency, unspecified: Secondary | ICD-10-CM

## 2020-01-05 DIAGNOSIS — D508 Other iron deficiency anemias: Secondary | ICD-10-CM

## 2020-01-05 DIAGNOSIS — E441 Mild protein-calorie malnutrition: Secondary | ICD-10-CM

## 2020-01-05 NOTE — Telephone Encounter (Signed)
-----   Message from Ellamae Sia sent at 12/24/2019  2:38 PM EDT ----- Regarding: Lab orders for Wednesday, 10.27.21 Patient is scheduled for CPX labs, please order future labs, Thanks , Karna Christmas

## 2020-01-09 ENCOUNTER — Other Ambulatory Visit: Payer: Self-pay

## 2020-01-09 ENCOUNTER — Other Ambulatory Visit (INDEPENDENT_AMBULATORY_CARE_PROVIDER_SITE_OTHER): Payer: Medicare Other

## 2020-01-09 ENCOUNTER — Ambulatory Visit: Payer: Medicare Other

## 2020-01-09 DIAGNOSIS — D508 Other iron deficiency anemias: Secondary | ICD-10-CM

## 2020-01-09 DIAGNOSIS — E782 Mixed hyperlipidemia: Secondary | ICD-10-CM | POA: Diagnosis not present

## 2020-01-09 DIAGNOSIS — E441 Mild protein-calorie malnutrition: Secondary | ICD-10-CM

## 2020-01-09 DIAGNOSIS — E559 Vitamin D deficiency, unspecified: Secondary | ICD-10-CM | POA: Diagnosis not present

## 2020-01-09 LAB — CBC WITH DIFFERENTIAL/PLATELET
Basophils Absolute: 0 10*3/uL (ref 0.0–0.1)
Basophils Relative: 0.5 % (ref 0.0–3.0)
Eosinophils Absolute: 0.1 10*3/uL (ref 0.0–0.7)
Eosinophils Relative: 3.9 % (ref 0.0–5.0)
HCT: 37.9 % (ref 36.0–46.0)
Hemoglobin: 12.9 g/dL (ref 12.0–15.0)
Lymphocytes Relative: 26.5 % (ref 12.0–46.0)
Lymphs Abs: 1 10*3/uL (ref 0.7–4.0)
MCHC: 34 g/dL (ref 30.0–36.0)
MCV: 92.2 fl (ref 78.0–100.0)
Monocytes Absolute: 0.4 10*3/uL (ref 0.1–1.0)
Monocytes Relative: 10.6 % (ref 3.0–12.0)
Neutro Abs: 2.2 10*3/uL (ref 1.4–7.7)
Neutrophils Relative %: 58.5 % (ref 43.0–77.0)
Platelets: 146 10*3/uL — ABNORMAL LOW (ref 150.0–400.0)
RBC: 4.11 Mil/uL (ref 3.87–5.11)
RDW: 13.8 % (ref 11.5–15.5)
WBC: 3.8 10*3/uL — ABNORMAL LOW (ref 4.0–10.5)

## 2020-01-09 LAB — IBC + FERRITIN
Ferritin: 43 ng/mL (ref 10.0–291.0)
Iron: 86 ug/dL (ref 42–145)
Saturation Ratios: 26.6 % (ref 20.0–50.0)
Transferrin: 231 mg/dL (ref 212.0–360.0)

## 2020-01-09 LAB — COMPREHENSIVE METABOLIC PANEL
ALT: 24 U/L (ref 0–35)
AST: 40 U/L — ABNORMAL HIGH (ref 0–37)
Albumin: 3.7 g/dL (ref 3.5–5.2)
Alkaline Phosphatase: 79 U/L (ref 39–117)
BUN: 7 mg/dL (ref 6–23)
CO2: 31 mEq/L (ref 19–32)
Calcium: 8.7 mg/dL (ref 8.4–10.5)
Chloride: 105 mEq/L (ref 96–112)
Creatinine, Ser: 0.52 mg/dL (ref 0.40–1.20)
GFR: 82.59 mL/min (ref >60.00)
Glucose, Bld: 99 mg/dL (ref 70–99)
Potassium: 4 mEq/L (ref 3.5–5.1)
Sodium: 141 mEq/L (ref 135–145)
Total Bilirubin: 0.9 mg/dL (ref 0.2–1.2)
Total Protein: 5.5 g/dL — ABNORMAL LOW (ref 6.0–8.3)

## 2020-01-09 LAB — VITAMIN D 25 HYDROXY (VIT D DEFICIENCY, FRACTURES): VITD: 35.25 ng/mL (ref 30.00–100.00)

## 2020-01-10 LAB — PREALBUMIN: Prealbumin: 13 mg/dL — ABNORMAL LOW (ref 17–34)

## 2020-01-10 NOTE — Telephone Encounter (Signed)
Pt had labs 10/27 and CrCl is 59.75, normal and Ca is normal.  Pt can proceed with prolia inj after 02/09/20.  Contacted Neoma Laming, pt's daughter, and scheduled pt for prolia on 02/12/20. Neoma Laming verbalized understanding.

## 2020-01-10 NOTE — Progress Notes (Signed)
No critical labs need to be addressed urgently. We will discuss labs in detail at upcoming office visit.   

## 2020-01-24 ENCOUNTER — Ambulatory Visit (INDEPENDENT_AMBULATORY_CARE_PROVIDER_SITE_OTHER): Payer: Medicare Other | Admitting: Family Medicine

## 2020-01-24 ENCOUNTER — Other Ambulatory Visit: Payer: Self-pay

## 2020-01-24 ENCOUNTER — Encounter: Payer: Self-pay | Admitting: Family Medicine

## 2020-01-24 VITALS — BP 142/72 | HR 70 | Ht <= 58 in | Wt 110.0 lb

## 2020-01-24 DIAGNOSIS — E440 Moderate protein-calorie malnutrition: Secondary | ICD-10-CM | POA: Diagnosis not present

## 2020-01-24 DIAGNOSIS — E782 Mixed hyperlipidemia: Secondary | ICD-10-CM

## 2020-01-24 DIAGNOSIS — F0390 Unspecified dementia without behavioral disturbance: Secondary | ICD-10-CM

## 2020-01-24 DIAGNOSIS — Z0001 Encounter for general adult medical examination with abnormal findings: Secondary | ICD-10-CM

## 2020-01-24 DIAGNOSIS — E559 Vitamin D deficiency, unspecified: Secondary | ICD-10-CM

## 2020-01-24 DIAGNOSIS — Z0000 Encounter for general adult medical examination without abnormal findings: Secondary | ICD-10-CM

## 2020-01-24 DIAGNOSIS — K7469 Other cirrhosis of liver: Secondary | ICD-10-CM

## 2020-01-24 DIAGNOSIS — D508 Other iron deficiency anemias: Secondary | ICD-10-CM

## 2020-01-24 DIAGNOSIS — I10 Essential (primary) hypertension: Secondary | ICD-10-CM | POA: Diagnosis not present

## 2020-01-24 DIAGNOSIS — F5104 Psychophysiologic insomnia: Secondary | ICD-10-CM

## 2020-01-24 DIAGNOSIS — F03A0 Unspecified dementia, mild, without behavioral disturbance, psychotic disturbance, mood disturbance, and anxiety: Secondary | ICD-10-CM

## 2020-01-24 MED ORDER — MEMANTINE HCL 10 MG PO TABS
10.0000 mg | ORAL_TABLET | Freq: Two times a day (BID) | ORAL | 11 refills | Status: DC
Start: 2020-01-24 — End: 2020-02-22

## 2020-01-24 NOTE — Assessment & Plan Note (Signed)
Stable followed by GI.

## 2020-01-24 NOTE — Assessment & Plan Note (Signed)
Gaunt and muscle wasting Prealbumin low at 13  Albumin 3.7  BMI 20  3 percent weight loss since last weight in 06/2019 Wt Readings from Last 3 Encounters:  01/24/20 110 lb (49.9 kg)  07/10/19 113 lb 12 oz (51.6 kg)  01/12/19 114 lb 8 oz (51.9 kg)

## 2020-01-24 NOTE — Assessment & Plan Note (Addendum)
Not  On med given age.

## 2020-01-24 NOTE — Assessment & Plan Note (Signed)
Resolved on iron.

## 2020-01-24 NOTE — Progress Notes (Signed)
Chief Complaint  Patient presents with  . Medicare Wellness    History of Present Illness: HPI The patient presents for annual medicare wellness, complete physical and review of chronic health problems. He/She also has the following acute concerns today: none  I have personally reviewed the Medicare Annual Wellness questionnaire and have noted 1. The patient's medical and social history 2. Their use of alcohol, tobacco or illicit drugs 3. Their current medications and supplements 4. The patient's functional ability including ADL's, fall risks, home safety risks and hearing or visual             impairment. 5. Diet and physical activities 6. Evidence for depression or mood disorders 7.         Updated provider list Cognitive evaluation was performed and recorded on pt medicare questionnaire form. The patients weight, height, BMI and visual acuity have been recorded in the chart  I have made referrals, counseling and provided education to the patient based review of the above and I have provided the pt with a written personalized care plan for preventive services.   Documentation of this information was scanned into the electronic record under the media tab.   Advance directives and end of life planning reviewed in detail with patient and documented in EMR. Patient given handout on advance care directives if needed. HCPOA and living will updated if needed.  Fall Risk  01/24/2020 01/08/2019 02/25/2017 01/30/2016 01/28/2015  Falls in the past year? 0 1 No No No  Number falls in past yr: - 0 - - -  Injury with Fall? - 1 - - -  Comment - hip fracture - - -  Risk for fall due to : - History of fall(s) - - -  Follow up - Falls evaluation completed;Falls prevention discussed - - -      Office Visit from 01/24/2020 in [place] at [facility] Total Score 0      Hypertension:  No longer on medication.. BPs trending up. BP Readings from Last 3 Encounters:  01/24/20  (!) 158/74  07/10/19 (!) 142/76  04/05/19 (!) 153/68  Using medication without problems or lightheadedness:  none Chest pain with exertion:none Edema:none Short of breath: none Average home BPs: not checking. Other issues:  Cryptogenic cirrhosis with portal hypertension, compensated: 10/27 AST slightly elevated,  followed by GI Dr. Hilarie Fredrickson   Duodenal angiodysplastic lesions --ablated by APC. ... can lead to a chronic iron deficiency anemia and GI bleeding. On PPI and iron.  Hg 12.9 in 12/2019, following q 3 months  Iron panel in nml range   Chronic insomnia: stable control on trazodone   malnutrition prealbumin low.. using Ensure daily  she feels appetite is good.   Vit D in nml range.. on supplement   mild dementia: no SE to namenda.  Continued decline.   This visit occurred during the SARS-CoV-2 public health emergency.  Safety protocols were in place, including screening questions prior to the visit, additional usage of staff PPE, and extensive cleaning of exam room while observing appropriate contact time as indicated for disinfecting solutions.   COVID 19 screen:  No recent travel or known exposure to COVID19 The patient denies respiratory symptoms of COVID 19 at this time. The importance of social distancing was discussed today.     Review of Systems  Constitutional: Negative for chills and fever.  HENT: Negative for congestion and ear pain.   Eyes: Negative for pain and redness.  Respiratory: Negative for cough  and shortness of breath.   Cardiovascular: Negative for chest pain, palpitations and leg swelling.  Gastrointestinal: Positive for constipation. Negative for abdominal pain, blood in stool, diarrhea, nausea and vomiting.  Genitourinary: Negative for dysuria.  Musculoskeletal: Negative for falls and myalgias.  Skin: Negative for rash.  Neurological: Negative for dizziness.  Psychiatric/Behavioral: Negative for depression. The patient is not nervous/anxious.        Past Medical History:  Diagnosis Date  . Arthritis   . Choledocholithiasis   . Cirrhosis, cryptogenic (HCC)    FOLLOWED BY DR PYRTLE-- LOV  IN EPIC--  STABLE PER NOTE  . Common bile duct stone   . Cryptogenic cirrhosis ([place])   . DDD (degenerative disc disease), lumbar   . Diverticulosis   . Esophageal varix ([place])   . GERD (gastroesophageal reflux disease)   . Hiatal hernia   . History of colonoscopy with polypectomy    ADENOMATOUS AND HYPERPLASIA POLYPS  (2008  &  2010)  . History of hepatitis C   . History of positive PPD    + TB SKIN TEST WITHOUT TB  . Hyperlipidemia   . Hypertension, portal ([place])    SECONDARY TO CRYPTOGENIC CIRRHOSIS  . Internal hemorrhoids   . Iron deficiency anemia   . Loss of hearing    right ear  . Osteoarthritis    hands, knees, and low back  . Osteoporosis   . Portal hypertension ([place])   . PUD (peptic ulcer disease)   . Squamous cell carcinoma of skin 04/07/2017   left cheek  . Tubular adenoma of colon   . Uterovaginal prolapse, incomplete   . Wears glasses   . Wears hearing aid    left ear only    reports that she has never smoked. She has never used smokeless tobacco. She reports that she does not drink alcohol and does not use drugs.   Current Outpatient Medications:  .  Calcium-Phosphorus-Vitamin D (CALCIUM GUMMIES PO), Take 1 tablet by mouth daily., Disp: , Rfl:  .  denosumab (PROLIA) 60 MG/ML SOSY injection, Inject 60 mg into the skin every 6 (six) months., Disp: , Rfl:  .  diclofenac sodium (VOLTAREN) 1 % GEL, Apply 4 g topically 4 (four) times daily. (Patient taking differently: Apply 4 g topically 4 (four) times daily as needed. ), Disp: 100 g, Rfl: 11 .  Iron Combinations (IRON COMPLEX PO), Take 1 tablet by mouth daily., Disp: , Rfl:  .  lidocaine (LIDODERM) 5 %, Place 1 patch onto the skin daily. Remove & Discard patch within 12 hours or as directed by MD, Disp: 30 patch, Rfl: 0 .  memantine (NAMENDA) 5 MG tablet, Take 1  tablet (5 mg total) by mouth 2 (two) times daily. TAKE 1 TABLET BY MOUTH DAILY FOR 1 WEEK THEN INCREASE TO 2 TABLETS DAILY IF TOLERATED, Disp: 180 tablet, Rfl: 1 .  pantoprazole (PROTONIX) 40 MG tablet, TAKE 1 TABLET BY MOUTH DAILY BEFORE BREAKFAST, Disp: 90 tablet, Rfl: 3 .  sennosides-docusate sodium (SENOKOT-S) 8.6-50 MG tablet, Take 1 tablet by mouth daily as needed for constipation., Disp: , Rfl:  .  simethicone (MYLICON) 664 MG chewable tablet, Chew 125 mg by mouth every 6 (six) hours as needed for flatulence., Disp: , Rfl:  .  timolol (TIMOPTIC) 0.5 % ophthalmic solution, Place 1 drop into both eyes 2 (two) times daily. , Disp: , Rfl:  .  traMADol (ULTRAM) 50 MG tablet, TAKE ONE-HALF TO ONE TABLET BY MOUTH UP TO EVERY 12  HOURS AS NEEDED FOR MODERATE PAIN., Disp: 30 tablet, Rfl: 0 .  traZODone (DESYREL) 150 MG tablet, TAKE 1 TABLET BY MOUTH EVERYDAY AT BEDTIME, Disp: 90 tablet, Rfl: 1 .  vitamin E 400 UNIT capsule, Take 400 Units by mouth daily., Disp: , Rfl:    Observations/Objective: Blood pressure (!) 158/74, pulse 70, height 5\' 2"  (1.575 m), weight 110 lb (49.9 kg), SpO2 98 %.  Physical Exam Constitutional:      General: She is not in acute distress.    Appearance: Normal appearance. She is well-developed. She is not ill-appearing or toxic-appearing.  HENT:     Head: Normocephalic.     Right Ear: Hearing, tympanic membrane, ear canal and external ear normal.     Left Ear: Hearing, tympanic membrane, ear canal and external ear normal.     Nose: Nose normal.  Eyes:     General: Lids are normal. Lids are everted, no foreign bodies appreciated.     Conjunctiva/sclera: Conjunctivae normal.     Pupils: Pupils are equal, round, and reactive to light.  Neck:     Thyroid: No thyroid mass or thyromegaly.     Vascular: No carotid bruit.     Trachea: Trachea normal.  Cardiovascular:     Rate and Rhythm: Normal rate and regular rhythm.     Heart sounds: Normal heart sounds, S1 normal and  S2 normal. No murmur heard.  No gallop.   Pulmonary:     Effort: Pulmonary effort is normal. No respiratory distress.     Breath sounds: Normal breath sounds. No wheezing, rhonchi or rales.  Abdominal:     General: Bowel sounds are normal. There is no distension or abdominal bruit.     Palpations: Abdomen is soft. There is no fluid wave or mass.     Tenderness: There is no abdominal tenderness. There is no guarding or rebound.     Hernia: No hernia is present.  Musculoskeletal:     Cervical back: Normal range of motion and neck supple.  Lymphadenopathy:     Cervical: No cervical adenopathy.  Skin:    General: Skin is warm and dry.     Findings: No rash.  Neurological:     Mental Status: She is alert.     Cranial Nerves: No cranial nerve deficit.     Sensory: No sensory deficit.  Psychiatric:        Mood and Affect: Mood is not anxious or depressed.        Speech: Speech normal.        Behavior: Behavior normal. Behavior is cooperative.        Judgment: Judgment normal.      Assessment and Plan   The patient's preventative maintenance and recommended screening tests for an annual wellness exam were reviewed in full today. Brought up to date unless services declined.  Counselled on the importance of diet, exercise, and its role in overall health and mortality. The patient's FH and SH was reviewed, including their home life, tobacco status, and drug and alcohol status.   Vaccines:uptodate with PNA, flu, , planning COVID booster, plan shingrix.Marland Kitchen rx given Pap/DVE:not indicated, no symptoms vaginally Mammo:neg 02/2019.Marland Kitchen plan repeat Bone Density:2011, on fosamax for 10 years, will stopped in 2013. Remained stable on 2015 DEXA, worse on DEXA 2017.Marland Kitchen Restarted fosamax. 06/2019 osteoporosis.Marland Kitchen. now on Prolia. Colon:nml 2010 no repeat needed Smoking Status: none   Shuna Tabor, MD   Malnutrition of moderate degree (HCC) Gaunt and muscle wasting Prealbumin low  at 13  Albumin  3.7  BMI 20  3 percent weight loss since last weight in 06/2019 Wt Readings from Last 3 Encounters:  01/24/20 110 lb (49.9 kg)  07/10/19 113 lb 12 oz (51.6 kg)  01/12/19 114 lb 8 oz (51.9 kg)     Essential hypertension, benign  Follow BP at home given borderline high.. tolerate up to 150/90 at home.  Mild dementia ([place]) Tolerating but continued decline per daughter. Increase to 10 mg BID if tolerated.  Cryptogenic cirrhosis ([place])  Stable followed by GI.  Iron deficiency anemia Resolved on iron.  Vitamin D deficiency Resolved on supplementation.  Hyperlipidemia  Not  On med given age.

## 2020-01-24 NOTE — Assessment & Plan Note (Signed)
Tolerating but continued decline per daughter. Increase to 10 mg BID if tolerated.

## 2020-01-24 NOTE — Assessment & Plan Note (Signed)
Follow BP at home given borderline high.. tolerate up to 150/90 at home.

## 2020-01-24 NOTE — Patient Instructions (Addendum)
Check blood pressure at home.Marland Kitchen goal < 150/90... call if consistently high.  Keep up with nutrition.. do not restrict diet.Marland Kitchen add more protein supplements.   keep up with water and fiber in diet. Try Metamucil ( Benefiber)

## 2020-01-24 NOTE — Assessment & Plan Note (Signed)
Resolved on supplementation. 

## 2020-01-25 ENCOUNTER — Ambulatory Visit: Payer: Medicare Other

## 2020-02-12 ENCOUNTER — Ambulatory Visit (INDEPENDENT_AMBULATORY_CARE_PROVIDER_SITE_OTHER): Payer: Medicare Other

## 2020-02-12 ENCOUNTER — Other Ambulatory Visit: Payer: Self-pay

## 2020-02-12 DIAGNOSIS — E559 Vitamin D deficiency, unspecified: Secondary | ICD-10-CM | POA: Diagnosis not present

## 2020-02-12 MED ORDER — DENOSUMAB 60 MG/ML ~~LOC~~ SOSY
60.0000 mg | PREFILLED_SYRINGE | Freq: Once | SUBCUTANEOUS | Status: AC
  Administered 2020-02-12: 60 mg via SUBCUTANEOUS

## 2020-02-12 NOTE — Progress Notes (Signed)
Per orders of Dr. Diona Browner, injection of Prolia, given by Aneta Mins, RN. Patient tolerated injection well in R Arm.

## 2020-02-22 ENCOUNTER — Other Ambulatory Visit: Payer: Self-pay

## 2020-02-22 ENCOUNTER — Other Ambulatory Visit: Payer: Self-pay | Admitting: Family Medicine

## 2020-02-22 DIAGNOSIS — F03A0 Unspecified dementia, mild, without behavioral disturbance, psychotic disturbance, mood disturbance, and anxiety: Secondary | ICD-10-CM

## 2020-02-22 DIAGNOSIS — F0390 Unspecified dementia without behavioral disturbance: Secondary | ICD-10-CM

## 2020-02-22 NOTE — Telephone Encounter (Signed)
Pharmacy is requesting a 90 day supply of the medication Memantine HCL 10 mg.  Loranzo Desha,cma

## 2020-03-13 IMAGING — CT CT CERVICAL SPINE W/O CM
2 series · 10 of 14 positions shown, 12 images · non-contrast
Comparison: Cervical spine radiograph performed 11/04/2017

CLINICAL DATA: Status post fall onto left side. Concern for head or
cervical spine injury. Initial encounter.

EXAM:
CT HEAD WITHOUT CONTRAST
CT CERVICAL SPINE WITHOUT CONTRAST
TECHNIQUE: Multidetector CT imaging of the head and cervical spine was
performed following the standard protocol without intravenous
contrast. Multiplanar CT image reconstructions of the cervical spine
were also generated.

[Series 3: head wo · axial · 0.42mm/px · z∈[+1241,+1296]mm · 2 of 33 slices shown]
[im 11/33  bone]
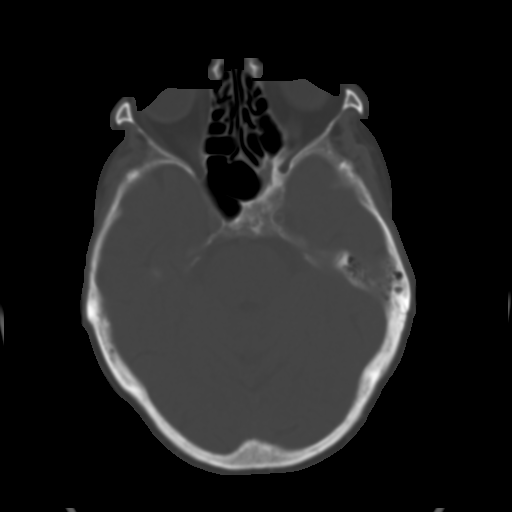
[im 22/33  bone]
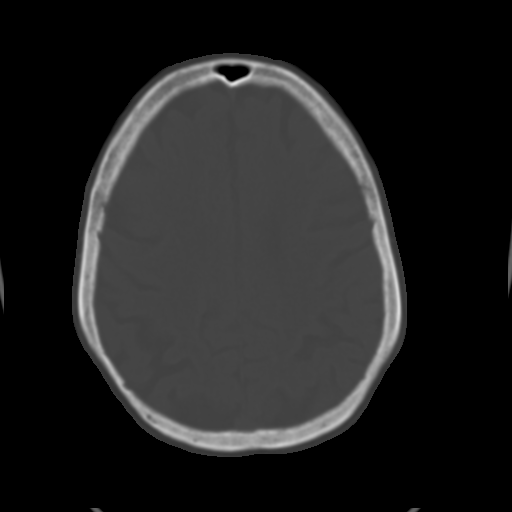

[Series 4: head bone · axial · 0.42mm/px · z∈[+1207,+1333]mm · 8 of 81 slices shown, 10 images]
[im 9/81  soft-tissue]
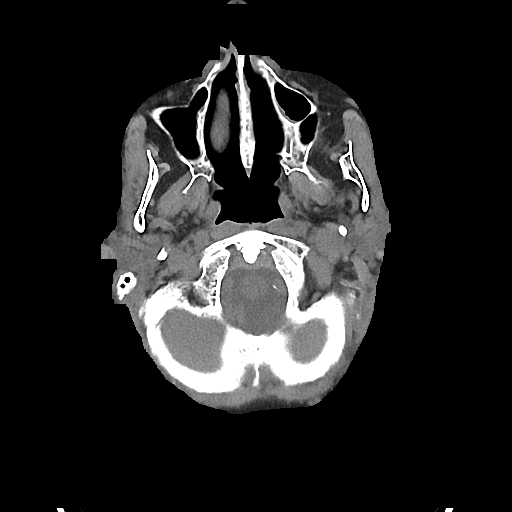
[im 9/81  bone]
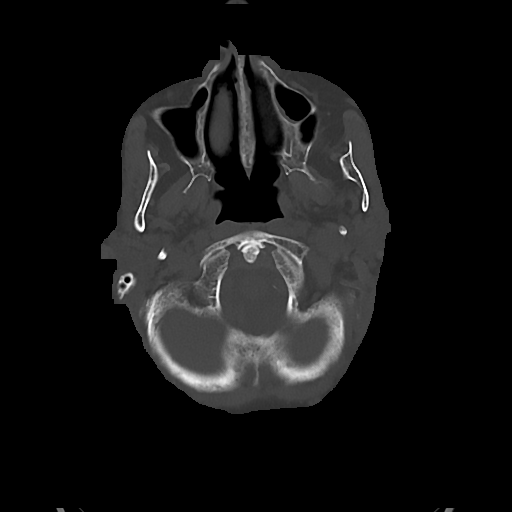
[im 18/81  bone]
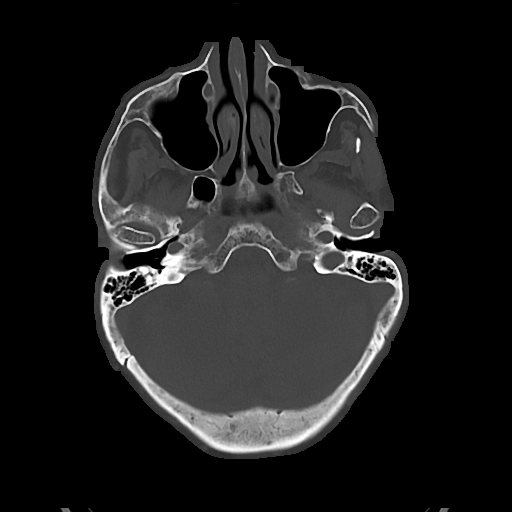
[im 27/81  bone]
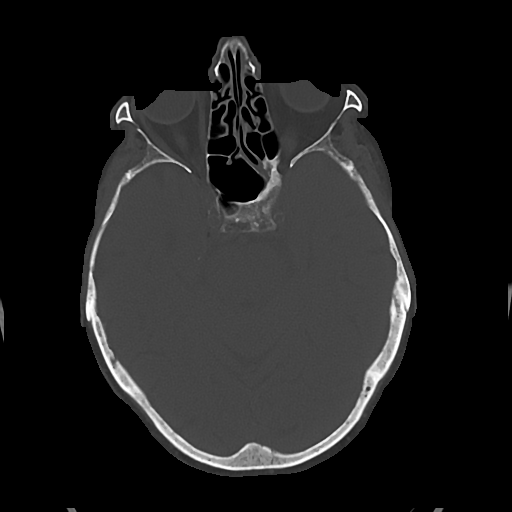
[im 36/81  bone]
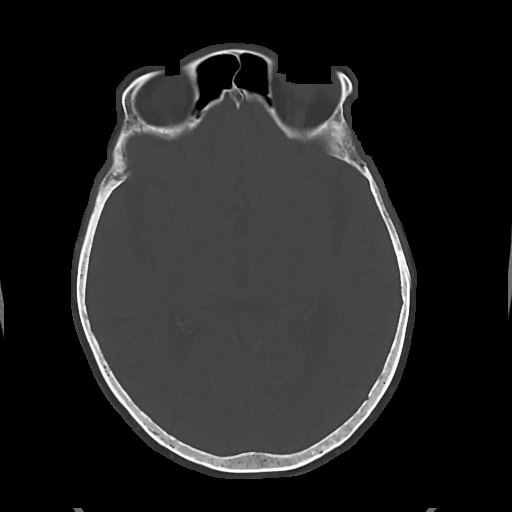
[im 45/81  soft-tissue]
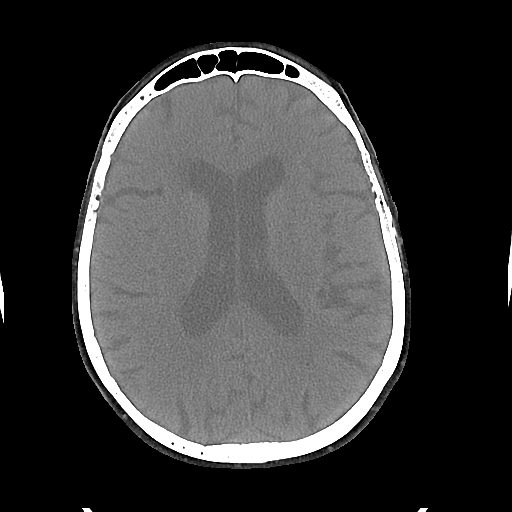
[im 45/81  bone]
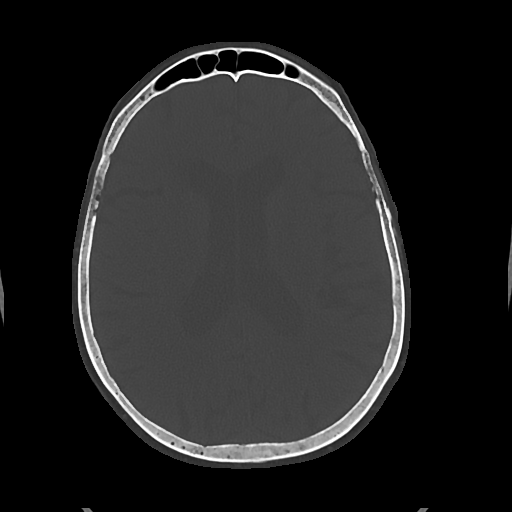
[im 54/81  bone]
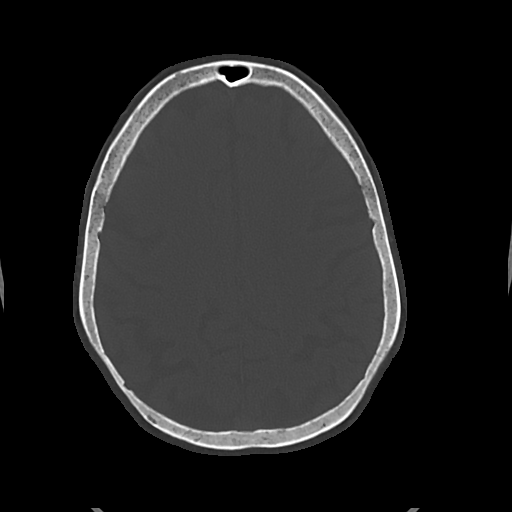
[im 63/81  bone]
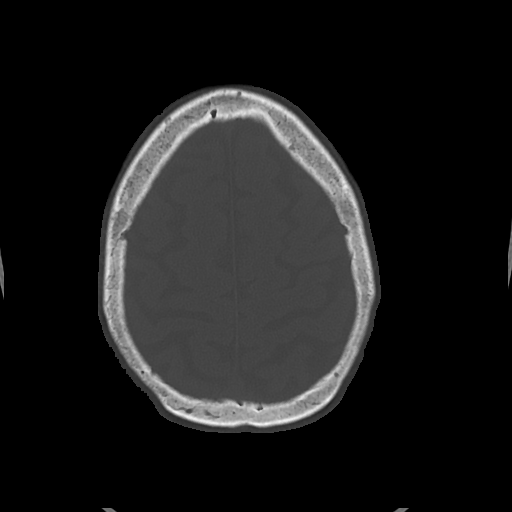
[im 72/81  bone]
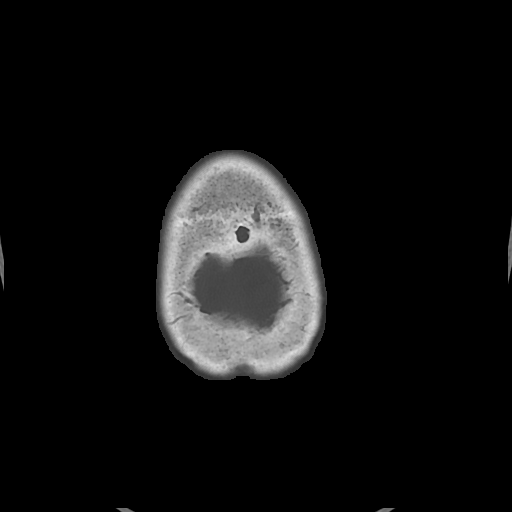

[10 of 14 positions shown; findings below may reference images not displayed]

FINDINGS: CT HEAD FINDINGS

Brain: No evidence of acute infarction, hemorrhage, hydrocephalus,
extra-axial collection or mass lesion / mass effect.

Prominence of the ventricles and sulci reflects mild cortical volume
loss. Scattered periventricular and subcortical white matter change
likely reflects small vessel ischemic microangiopathy. Mild
cerebellar atrophy is noted.

The brainstem and fourth ventricle are within normal limits. The
basal ganglia are unremarkable in appearance. The cerebral
hemispheres demonstrate grossly normal gray-white differentiation.
No mass effect or midline shift is seen.

Vascular: No hyperdense vessel or unexpected calcification.

Skull: There is no evidence of fracture; visualized osseous
structures are unremarkable in appearance.

Sinuses/Orbits: The orbits are within normal limits. There is
opacification and mucoperiosteal thickening at the left side of the
sphenoid sinus. The remaining paranasal sinuses and mastoid air
cells are well-aerated.

Other: No significant soft tissue abnormalities are seen.

CT CERVICAL SPINE FINDINGS

Alignment: Normal.

Skull base and vertebrae: No acute fracture. No primary bone lesion
or focal pathologic process.

Soft tissues and spinal canal: No prevertebral fluid or swelling. No
visible canal hematoma.

Disc levels: There is mild intervertebral disc space narrowing at
C4-C5. Intervertebral disc spaces are grossly preserved.

Upper chest: The visualized lung apices are clear. The thyroid gland
is unremarkable. Calcification is seen at the carotid bifurcations
bilaterally.

Other: No additional soft tissue abnormalities are seen.
IMPRESSION: 1. No evidence of traumatic intracranial injury or fracture.
2. No evidence of fracture or subluxation along the cervical spine.
3. Mild cortical volume loss and scattered small vessel ischemic
microangiopathy.
4. Opacification and mucoperiosteal thickening at the left side of
the sphenoid sinus.
5. Calcification at the carotid bifurcations bilaterally. Carotid
ultrasound would be helpful for further evaluation, when and as
deemed clinically appropriate.

## 2020-03-14 IMAGING — RF DG C-ARM 61-120 MIN
1 series · 4 of 4 positions shown · non-contrast
Comparison: 01/21/2018

CLINICAL DATA: ORIF intertrochanteric fracture.

EXAM:
LEFT FEMUR 2 VIEWS; DG C-ARM 61-120 MIN

[Series 1: run · 4 of 4 slices shown]
[im 1/4]
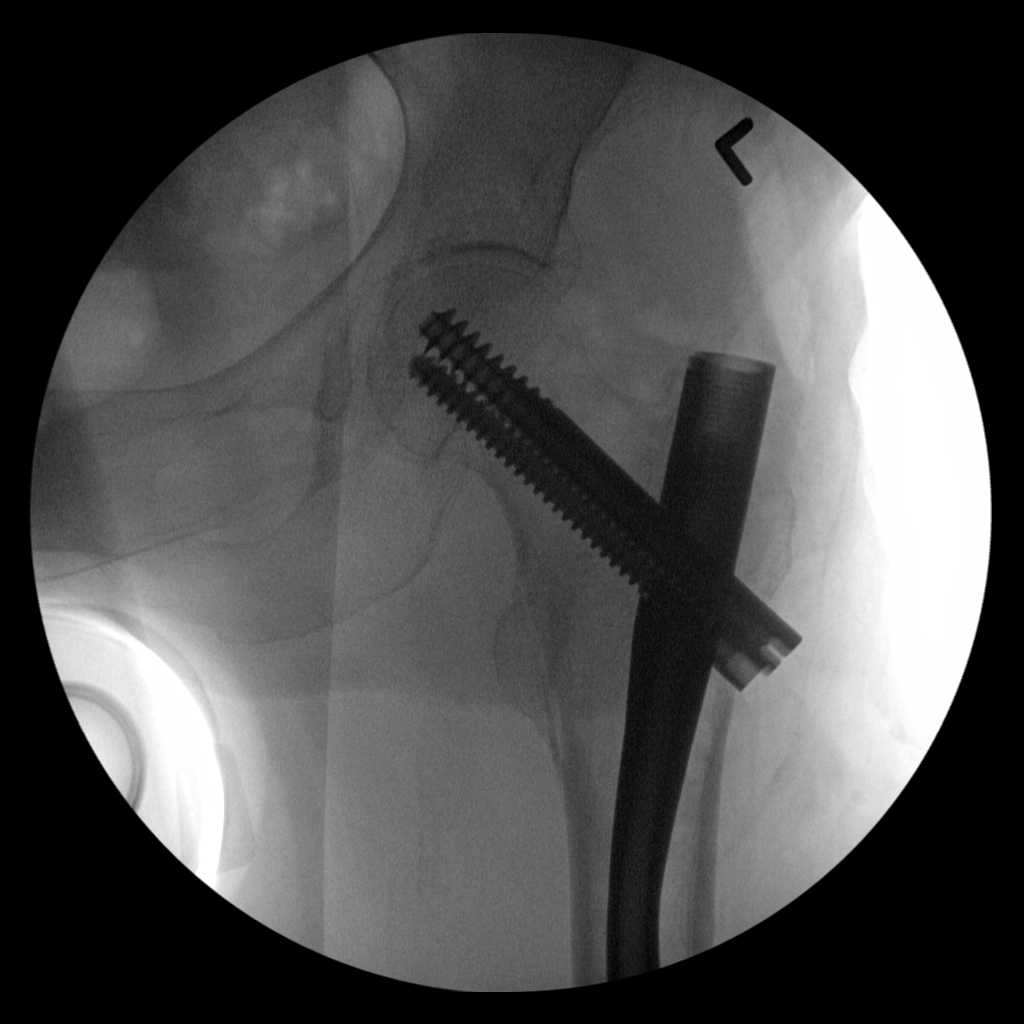
[im 2/4]
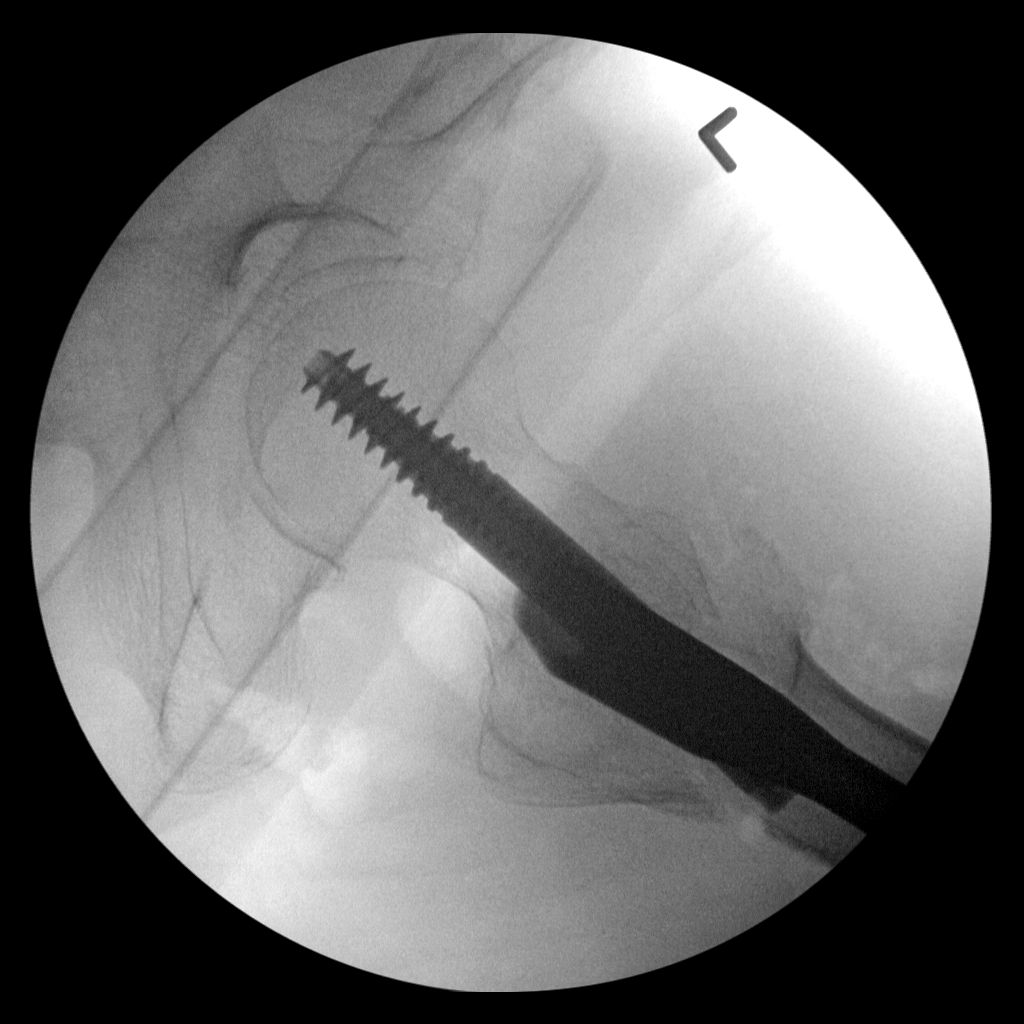
[im 3/4]
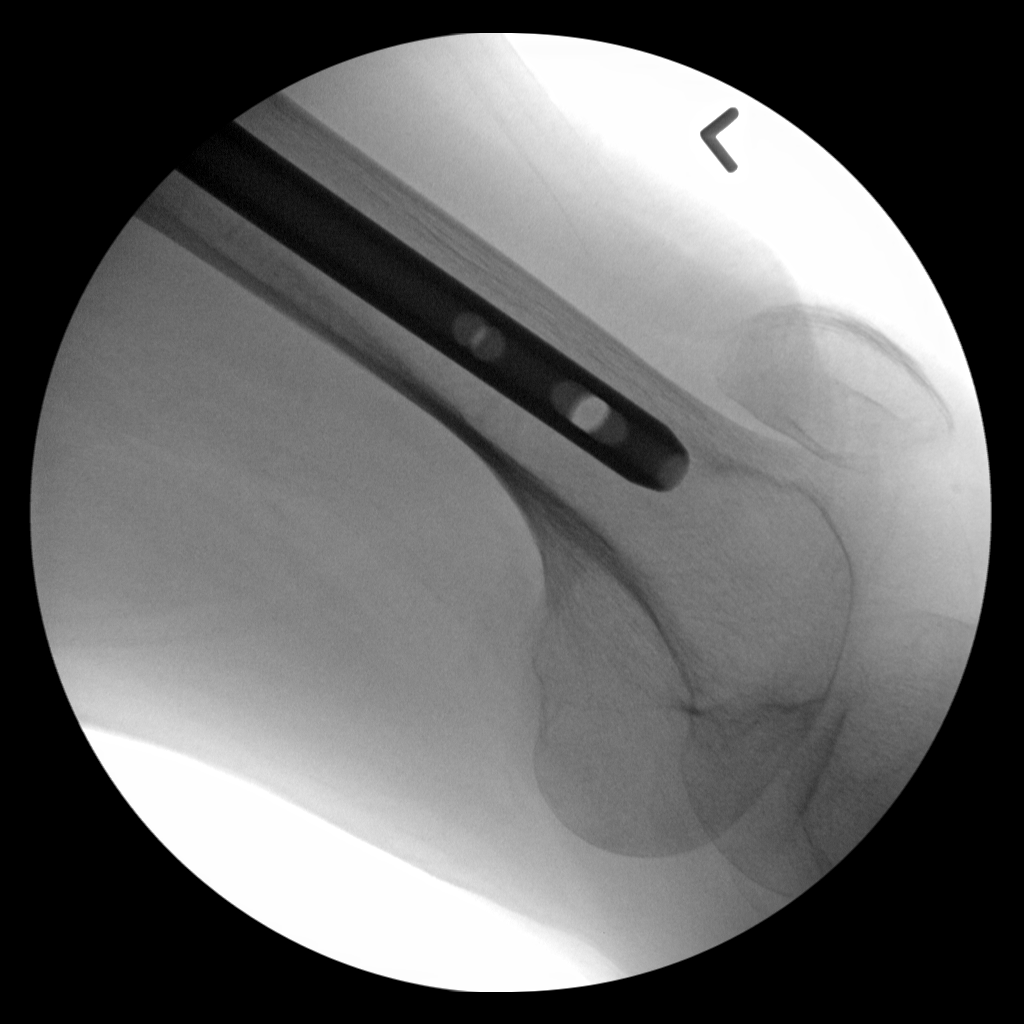
[im 4/4]
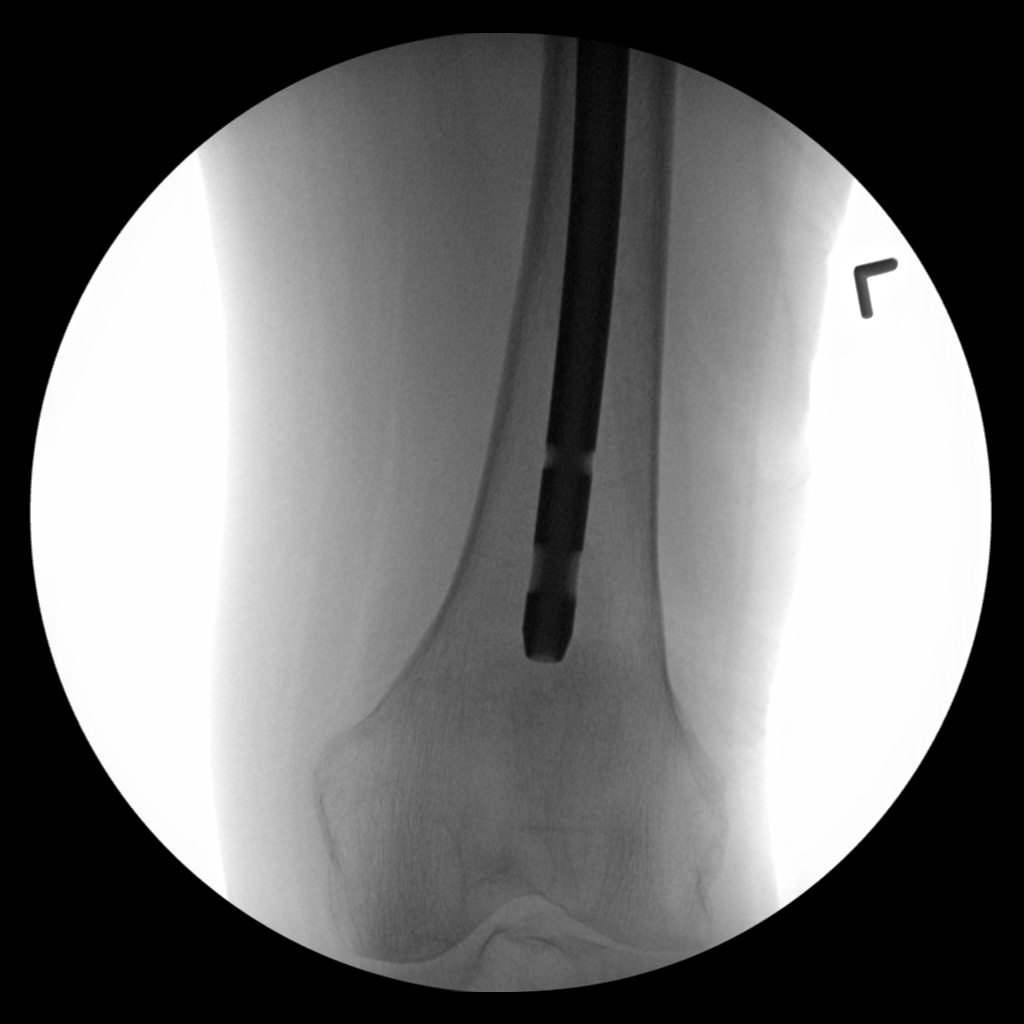

[4 of 4 positions shown; findings below may reference images not displayed]

FINDINGS: ORIF of intertrochanteric fracture. Components appear well
positioned. Anatomic alignment. No radiographically detectable
complication.
IMPRESSION: Good appearance following ORIF.

## 2020-03-19 DIAGNOSIS — H401132 Primary open-angle glaucoma, bilateral, moderate stage: Secondary | ICD-10-CM | POA: Diagnosis not present

## 2020-03-25 DIAGNOSIS — H401132 Primary open-angle glaucoma, bilateral, moderate stage: Secondary | ICD-10-CM | POA: Diagnosis not present

## 2020-03-26 ENCOUNTER — Other Ambulatory Visit: Payer: Self-pay | Admitting: Family Medicine

## 2020-03-26 DIAGNOSIS — Z1231 Encounter for screening mammogram for malignant neoplasm of breast: Secondary | ICD-10-CM

## 2020-04-21 DIAGNOSIS — R3915 Urgency of urination: Secondary | ICD-10-CM | POA: Diagnosis not present

## 2020-04-24 ENCOUNTER — Telehealth: Payer: Self-pay | Admitting: Family Medicine

## 2020-04-24 ENCOUNTER — Ambulatory Visit: Payer: Medicare Other | Admitting: Dermatology

## 2020-04-24 DIAGNOSIS — R42 Dizziness and giddiness: Secondary | ICD-10-CM

## 2020-04-24 NOTE — Telephone Encounter (Signed)
Patients son in law called per ok in [place] stating that they are needing a referral to go to [facility] to see Dr Hollace Kinnier.  Please advise. EM

## 2020-04-24 NOTE — Addendum Note (Signed)
Addended byEliezer Lofts E on: 04/24/2020 12:22 PM   Modules accepted: Orders

## 2020-04-24 NOTE — Telephone Encounter (Signed)
Brianna Barnes, ok per DPR on file, would like to see Dr Mariea Clonts for dizziness issues patient has been having, they think maybe with her been a geriatric provider maybe she had other things patient can try. They are not trying to transfer care from Dr Diona Browner, just mainly for this issue. Dr Diona Browner, there should be a choice for referral to geriatric provider. Thank you

## 2020-04-24 NOTE — Telephone Encounter (Signed)
What type of referral would this be?

## 2020-04-29 ENCOUNTER — Encounter: Payer: Self-pay | Admitting: Orthopedic Surgery

## 2020-04-29 ENCOUNTER — Other Ambulatory Visit: Payer: Self-pay

## 2020-04-29 ENCOUNTER — Ambulatory Visit (INDEPENDENT_AMBULATORY_CARE_PROVIDER_SITE_OTHER): Payer: Medicare Other | Admitting: Orthopedic Surgery

## 2020-04-29 VITALS — BP 120/76 | HR 82 | Temp 97.9°F | Resp 20 | Ht 59.0 in | Wt 105.8 lb

## 2020-04-29 DIAGNOSIS — E782 Mixed hyperlipidemia: Secondary | ICD-10-CM | POA: Diagnosis not present

## 2020-04-29 DIAGNOSIS — R059 Cough, unspecified: Secondary | ICD-10-CM

## 2020-04-29 DIAGNOSIS — M159 Polyosteoarthritis, unspecified: Secondary | ICD-10-CM

## 2020-04-29 DIAGNOSIS — M810 Age-related osteoporosis without current pathological fracture: Secondary | ICD-10-CM

## 2020-04-29 DIAGNOSIS — D508 Other iron deficiency anemias: Secondary | ICD-10-CM

## 2020-04-29 DIAGNOSIS — I10 Essential (primary) hypertension: Secondary | ICD-10-CM

## 2020-04-29 DIAGNOSIS — R42 Dizziness and giddiness: Secondary | ICD-10-CM

## 2020-04-29 DIAGNOSIS — F0390 Unspecified dementia without behavioral disturbance: Secondary | ICD-10-CM | POA: Diagnosis not present

## 2020-04-29 DIAGNOSIS — H9193 Unspecified hearing loss, bilateral: Secondary | ICD-10-CM

## 2020-04-29 DIAGNOSIS — K219 Gastro-esophageal reflux disease without esophagitis: Secondary | ICD-10-CM | POA: Diagnosis not present

## 2020-04-29 DIAGNOSIS — M8949 Other hypertrophic osteoarthropathy, multiple sites: Secondary | ICD-10-CM | POA: Diagnosis not present

## 2020-04-29 DIAGNOSIS — K7469 Other cirrhosis of liver: Secondary | ICD-10-CM | POA: Diagnosis not present

## 2020-04-29 DIAGNOSIS — F03A0 Unspecified dementia, mild, without behavioral disturbance, psychotic disturbance, mood disturbance, and anxiety: Secondary | ICD-10-CM

## 2020-04-29 NOTE — Progress Notes (Signed)
Careteam: Patient Care Team: Jinny Sanders, MD as PCP - General  Seen by: [name], AGNP-C  PLACE OF SERVICE:  [place] Directive information    No Known Allergies  No chief complaint on file.    HPI: Patient is a 87 y.o. female seen today to establish at [facility]. Past medical records available in Epic.   Daughter, Jackelyn Poling present for encounter. They have not brought prescription medications today.  Previous PCP was Dr. Eliezer Lofts. Interested in seeing Geriatrics doctor due to her advanced age.   Originally from [place], [place]. Retired since 1992. 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 Two daughters and one son- all live close. Currently resides in [place] in a  single-family home. Lives alone. Wears life alert. Does not drive. Children help with getting groceries and medications.   Cough- occurs in the morning. Described as dry and productive. Previous PCP recommended mucinex. Does not know if it is helping. Denies sob, chest pain, heartburn or allergies.   Hypertension- not on medications at this time. Does not recall if she was on medications in the past. Does not check bp at home. Tries to limit sodium in diet.   Mild dementia-  started a few years ago, does not know if it is related to poor hearing. Daughter denies behavioral outbursts or sundowning. Takes Namenda daily. Previously in Aricept, could not tolerate due to upset stomach. She uses a pill box to take her own medications. Daughter states she takes medication out of pill box daily, but bad about using prn medicines like ibuprofen and Trazodone.   Hearing- deaf in right ear. Hearing aid in left ear. Followed by audiologist, cannot recall name. Believes she was seen about 5 months ago. Advised new hearing aid, but she does not want to spend $8K at this time.   Cryptogenic cirrhosis with portal hypertension- followed by Dr. Hilarie Fredrickson. AST slightly elevated in  October, 2021.   Dizziness- occurs with quick movements. Cannot recall how often episodes occur or how long. Some episodes last hours. Also having balance issues. Using cane to ambulate at this time, but also holds on to furniture. Daughter concerned about falls. Left hip fracture in 2019. Dr. [name] orthopedic Psychologist, sport and exercise.   Osteoporosis- receives bone density tests every 2 years, last done 06/2019. History of hip fracture in past. Takes calcium and vitamin D supplement. Prolia injections every 6 months, last one in 01/2020.   Hyperlipidemia- does not take statin. Follows low fat diet.   Osteoarthritis- complains of generalized pain. Most problematic areas are lower back, hands, shoulders, and knees. Tramadol prescribed but she does not take often, worried about getting addicted. Will also take ibuprofen a few times a week for pain. Does not care of tylenol, does not think it is effective.  GERD- takes Protonix daily. Known food triggers are oranges and cucumbers.    Insomnia- sleeping about 8 hours a night. Takes Trazodone, but not daily, scared of addiction.  No recent hospitalizations, falls or injuries.   Never smoked or drank alcohol.   Appetite- Eats 2-3 meals daily. Favorite meal is breakfast. Foods often consumed are - grits,  oatmeal, ham biscuits, cookies and coffee. Often eats sandwich for lunch and family brings dinner. Also has boost daily.   Hydration- drinks a lot of water- adds peach flavoring.   Eye doctor- last seen 2 weeks ago. Dr. Mordecai Rasmussen at [facility]. No recent changes in vision  Dermatologist- goes yearly.  Cannot recall provider name. Denies skin issues.   Dental Exam- she has upper and lower dentures. Denies mouth sores or gum pain.   Last mammogram 02/2019. Next study scheduled for 05/06/2020.   Does not do colonoscopies or pap smears.   Covid booster in Oct 2021.   Has advanced directives, copy requested. Daughter, Vassie Moselle.   Review of  Systems:  Review of Systems  Constitutional: Negative for chills, fever and malaise/fatigue.  HENT: Positive for hearing loss. Negative for sore throat and tinnitus.        Hearing aid  Eyes: Negative for blurred vision and double vision.  Respiratory: Positive for cough and sputum production. Negative for shortness of breath and wheezing.   Cardiovascular: Negative for chest pain and leg swelling.  Gastrointestinal: Positive for heartburn. Negative for abdominal pain, constipation, nausea and vomiting.  Genitourinary: Negative for dysuria, frequency and hematuria.  Musculoskeletal: Positive for back pain, joint pain and myalgias. Negative for falls.  Skin:       Dry skin  Neurological: Positive for dizziness. Negative for weakness and headaches.       Balance issues  Psychiatric/Behavioral: Positive for memory loss. Negative for depression. The patient is not nervous/anxious and does not have insomnia.     Past Medical History:  Diagnosis Date  . Arthritis   . Choledocholithiasis   . Cirrhosis, cryptogenic (HCC)    FOLLOWED BY DR PYRTLE-- LOV  IN EPIC--  STABLE PER NOTE  . Common bile duct stone   . Cryptogenic cirrhosis ([place])   . DDD (degenerative disc disease), lumbar   . Diverticulosis   . Esophageal varix ([place])   . GERD (gastroesophageal reflux disease)   . Hiatal hernia   . History of colonoscopy with polypectomy    ADENOMATOUS AND HYPERPLASIA POLYPS  (2008  &  2010)  . History of hepatitis C   . History of positive PPD    + TB SKIN TEST WITHOUT TB  . Hyperlipidemia   . Hypertension, portal ([place])    SECONDARY TO CRYPTOGENIC CIRRHOSIS  . Internal hemorrhoids   . Iron deficiency anemia   . Loss of hearing    right ear  . Osteoarthritis    hands, knees, and low back  . Osteoporosis   . Portal hypertension ([place])   . PUD (peptic ulcer disease)   . Squamous cell carcinoma of skin 04/07/2017   left cheek  . Tubular adenoma of colon   . Uterovaginal prolapse,  incomplete   . Wears glasses   . Wears hearing aid    left ear only   Past Surgical History:  Procedure Laterality Date  . APPENDECTOMY  1960'S  . CHOLECYSTECTOMY  08/25/2011   Procedure: LAPAROSCOPIC CHOLECYSTECTOMY WITH INTRAOPERATIVE CHOLANGIOGRAM;  Surgeon: Zenovia Jarred, MD;  Location: [facility];  Service: General;  Laterality: N/A;  . ENDOSCOPIC RETROGRADE CHOLANGIOPANCREATOGRAPHY (ERCP) WITH PROPOFOL N/A 11/30/2012   Procedure: ENDOSCOPIC RETROGRADE CHOLANGIOPANCREATOGRAPHY (ERCP) WITH PROPOFOL;  Surgeon: Milus Banister, MD;  Location: [facility];  Service: Endoscopy;  Laterality: N/A;  . ESOPHAGOGASTRODUODENOSCOPY N/A 05/18/2018   Procedure: ESOPHAGOGASTRODUODENOSCOPY (EGD);  Surgeon: Lin Landsman, MD;  Location: [facility] ENDOSCOPY;  Service: Gastroenterology;  Laterality: N/A;  . EXTERNAL EAR SURGERY Left 1980  . HYSTEROSCOPY WITH D & C N/A 07/15/2014   Procedure: DILATATION AND CURETTAGE /HYSTEROSCOPY;  Surgeon: Molli Posey, MD;  Location: [facility];  Service: Gynecology;  Laterality: N/A;  . INTRAMEDULLARY (IM) NAIL INTERTROCHANTERIC Left 01/22/2018   Procedure: ORIF LEFT  INTERTROCHANTRIC HIP FX;  Surgeon: Mcarthur Rossetti, MD;  Location: [facility];  Service: Orthopedics;  Laterality: Left;  . KNEE ARTHROSCOPY W/ MENISCECTOMY Right 06-19-2002  . [place] SURGERY  1990  . REMOVAL OF URINARY SLING N/A 09/13/2013   Procedure: REMOVAL OF RETAINED PESSARY;  Surgeon: Jorja Loa, MD;  Location: [facility];  Service: Urology;  Laterality: N/A;   Social History:   reports that she has never smoked. She has never used smokeless tobacco. She reports that she does not drink alcohol and does not use drugs.  Family History  Problem Relation Age of Onset  . Heart disease Mother   . Heart disease Father 22  . Hyperlipidemia Father   . Diabetes Father   . Cirrhosis Sister   . Diabetes Brother   . Heart disease Brother   . Cirrhosis  Brother   . Pancreatic cancer Brother   . Colon cancer Neg Hx   . Colon polyps Neg Hx   . Esophageal cancer Neg Hx   . Stomach cancer Neg Hx   . Inflammatory bowel disease Neg Hx     Medications: Patient's Medications  New Prescriptions   No medications on file  Previous Medications   CALCIUM-PHOSPHORUS-VITAMIN D (CALCIUM GUMMIES PO)    Take 1 tablet by mouth daily.   DENOSUMAB (PROLIA) 60 MG/ML SOSY INJECTION    Inject 60 mg into the skin every 6 (six) months.   DICLOFENAC SODIUM (VOLTAREN) 1 % GEL    Apply 4 g topically 4 (four) times daily.   IRON COMBINATIONS (IRON COMPLEX PO)    Take 1 tablet by mouth daily.   LIDOCAINE (LIDODERM) 5 %    Place 1 patch onto the skin daily. Remove & Discard patch within 12 hours or as directed by MD   MEMANTINE (NAMENDA) 10 MG TABLET    TAKE 1 TABLET BY MOUTH TWICE A DAY   PANTOPRAZOLE (PROTONIX) 40 MG TABLET    TAKE 1 TABLET BY MOUTH DAILY BEFORE BREAKFAST   SENNOSIDES-DOCUSATE SODIUM (SENOKOT-S) 8.6-50 MG TABLET    Take 1 tablet by mouth daily as needed for constipation.   SIMETHICONE (MYLICON) 025 MG CHEWABLE TABLET    Chew 125 mg by mouth every 6 (six) hours as needed for flatulence.   TIMOLOL (TIMOPTIC) 0.5 % OPHTHALMIC SOLUTION    Place 1 drop into both eyes 2 (two) times daily.    TRAMADOL (ULTRAM) 50 MG TABLET    TAKE ONE-HALF TO ONE TABLET BY MOUTH UP TO EVERY 12 HOURS AS NEEDED FOR MODERATE PAIN.   TRAZODONE (DESYREL) 150 MG TABLET    TAKE 1 TABLET BY MOUTH EVERYDAY AT BEDTIME   VITAMIN E 400 UNIT CAPSULE    Take 400 Units by mouth daily.  Modified Medications   No medications on file  Discontinued Medications   No medications on file    Physical Exam:  There were no vitals filed for this visit. There is no height or weight on file to calculate BMI. Wt Readings from Last 3 Encounters:  01/24/20 110 lb (49.9 kg)  07/10/19 113 lb 12 oz (51.6 kg)  01/12/19 114 lb 8 oz (51.9 kg)    Physical Exam Vitals reviewed.  Constitutional:       General: She is not in acute distress. HENT:     Head: Normocephalic.     Right Ear: There is no impacted cerumen.     Left Ear: There is no impacted cerumen.  Nose: Nose normal.     Mouth/Throat:     Mouth: Mucous membranes are moist.     Comments: dentures Eyes:     General:        Right eye: No discharge.        Left eye: No discharge.  Neck:     Thyroid: No thyroid mass or thyromegaly.  Cardiovascular:     Rate and Rhythm: Normal rate and regular rhythm.     Pulses: Normal pulses.     Heart sounds: Normal heart sounds. No murmur heard.   Pulmonary:     Effort: Pulmonary effort is normal. No respiratory distress.     Breath sounds: Normal breath sounds. No wheezing.  Abdominal:     General: Abdomen is flat. Bowel sounds are normal. There is no distension.     Palpations: Abdomen is soft.     Tenderness: There is no abdominal tenderness.  Musculoskeletal:     Cervical back: Normal range of motion.     Right lower leg: No edema.     Left lower leg: No edema.  Lymphadenopathy:     Cervical: No cervical adenopathy.  Skin:    General: Skin is warm and dry.     Capillary Refill: Capillary refill takes less than 2 seconds.  Neurological:     General: No focal deficit present.     Mental Status: She is alert. Mental status is at baseline.     Motor: Weakness present.     Gait: Gait abnormal.     Comments: cane  Psychiatric:        Mood and Affect: Mood normal.        Behavior: Behavior normal.        Cognition and Memory: Memory is impaired.     Labs reviewed: Basic Metabolic Panel: Recent Labs    08/01/19 0748 12/21/19 1230 01/09/20 0754  NA 140 142 141  K 3.7 4.2 4.0  CL 106 107 105  CO2 29 28 31   GLUCOSE 103* 124* 99  BUN 9 11 7   CREATININE 0.60 0.68 0.52  CALCIUM 8.6 8.6 8.7   Liver Function Tests: Recent Labs    05/16/19 1038 12/21/19 1230 01/09/20 0754  AST 34 33 40*  ALT 19 19 24   ALKPHOS 81 72 79  BILITOT 0.8 0.8 0.9  PROT 6.0  5.9* 5.5*  ALBUMIN 3.5 3.6 3.7   No results for input(s): LIPASE, AMYLASE in the last 8760 hours. No results for input(s): AMMONIA in the last 8760 hours. CBC: Recent Labs    05/16/19 1038 12/21/19 1230 01/09/20 0754  WBC 5.0 4.4 3.8*  NEUTROABS 2.8 2.6 2.2  HGB 12.6 13.0 12.9  HCT 37.0 38.9 37.9  MCV 91.6 92.6 92.2  PLT 199.0 145.0* 146.0*   Lipid Panel: No results for input(s): CHOL, HDL, LDLCALC, TRIG, CHOLHDL, LDLDIRECT in the last 8760 hours. TSH: No results for input(s): TSH in the last 8760 hours. A1C: Lab Results  Component Value Date   HGBA1C 4.9 01/18/2014     Assessment/Plan 1. Vertigo - occurring more often - advised to limit dimenhydrinate to once prn daily - BEERS list medication - using cane to ambulate, but also "furniture grabs" - concerned for falls - falls safety plan discussed with daughter - ambulatory referral for PT   2. Essential hypertension - bp at goal < 150/90 - does not take medication - continue to limit sodium in diet - cbc/diff- future - cmp- future - tsh- future  3. Mixed hyperlipidemia - does not take statin at this time - cont low fat diet  4. Mild dementia ([place]) - suspect it is related to hearing loss and advanced age - she is able to take her medicines on her own with help of pill box - children provide assistance with meals and supplying medications - cont namenda - MMSE- future  5. Hearing decreased, bilateral - followed by audiologist- name unknown - deaf in right ear - new hearing aid recommended for left- does not want to purchase at this time  6. Cryptogenic cirrhosis ([place]) - followed by Dr. Hilarie Fredrickson - last AST elevated  7. Primary osteoarthritis involving multiple joints - stable with prn ibuprofen and voltaren - advise taking ibuprofen with meals - advised limiting ibuprofen to < 2400 mg/day  8. Age-related osteoporosis without current pathological fracture - history of left hip fracture - cont prolia  with calcium/vit D supplements - recommend weight bearing exercise - cont wearing life alert at home  9. Gastroesophageal reflux disease without esophagitis - stable with Protonix - continue to avoid known food triggers  10. Other iron deficiency anemia - stable with iron complex, no bowel issues - cont prn senna   11. Cough - cough in AM, some mucous production, no blood  - may cont mucinex - advised to hydrate throughout day  I provided 55 minutes of face-to-face time during this encounter.    Next appt: 05/30/2020 [name], [facility] [phone]

## 2020-04-29 NOTE — Patient Instructions (Signed)
Follow up in 4 weeks to do MMSE  Memory Compensation Strategies  1. Use "WARM" strategy.  W= write it down  A= associate it  R= repeat it  M= make a mental note  2.   You can keep a Social worker.  Use a 3-ring notebook with sections for the following: calendar, important names and phone numbers,  medications, doctors' names/phone numbers, lists/reminders, and a section to journal what you did  each day.   3.    Use a calendar to write appointments down.  4.    Write yourself a schedule for the day.  This can be placed on the calendar or in a separate section of the Memory Notebook.  Keeping a  regular schedule can help memory.  5.    Use medication organizer with sections for each day or morning/evening pills.  You may need help loading it  6.    Keep a basket, or pegboard by the door.  Place items that you need to take out with you in the basket or on the pegboard.  You may also want to  include a message board for reminders.  7.    Use sticky notes.  Place sticky notes with reminders in a place where the task is performed.  For example: " turn off the  stove" placed by the stove, "lock the door" placed on the door at eye level, " take your medications" on  the bathroom mirror or by the place where you normally take your medications.  8.    Use alarms/timers.  Use while cooking to remind yourself to check on food or as a reminder to take your medicine, or as a  reminder to make a call, or as a reminder to perform another task, etc.

## 2020-05-06 ENCOUNTER — Other Ambulatory Visit: Payer: Self-pay

## 2020-05-06 ENCOUNTER — Ambulatory Visit
Admission: RE | Admit: 2020-05-06 | Discharge: 2020-05-06 | Disposition: A | Discharge status: Home / Self Care | Payer: Medicare Other | Source: Ambulatory Visit | Attending: Family Medicine | Admitting: Family Medicine

## 2020-05-06 DIAGNOSIS — Z1231 Encounter for screening mammogram for malignant neoplasm of breast: Secondary | ICD-10-CM | POA: Diagnosis not present

## 2020-05-07 ENCOUNTER — Other Ambulatory Visit: Payer: Self-pay | Admitting: Orthopedic Surgery

## 2020-05-07 DIAGNOSIS — I10 Essential (primary) hypertension: Secondary | ICD-10-CM

## 2020-05-07 DIAGNOSIS — E782 Mixed hyperlipidemia: Secondary | ICD-10-CM

## 2020-05-07 DIAGNOSIS — D508 Other iron deficiency anemias: Secondary | ICD-10-CM

## 2020-05-07 DIAGNOSIS — E039 Hypothyroidism, unspecified: Secondary | ICD-10-CM

## 2020-05-19 ENCOUNTER — Other Ambulatory Visit: Payer: Self-pay | Admitting: Family Medicine

## 2020-05-22 ENCOUNTER — Other Ambulatory Visit: Payer: Self-pay

## 2020-05-22 DIAGNOSIS — D508 Other iron deficiency anemias: Secondary | ICD-10-CM

## 2020-05-23 ENCOUNTER — Other Ambulatory Visit: Payer: Medicare Other

## 2020-05-23 ENCOUNTER — Other Ambulatory Visit: Payer: Self-pay

## 2020-05-23 DIAGNOSIS — I10 Essential (primary) hypertension: Secondary | ICD-10-CM

## 2020-05-23 DIAGNOSIS — E039 Hypothyroidism, unspecified: Secondary | ICD-10-CM

## 2020-05-23 DIAGNOSIS — E782 Mixed hyperlipidemia: Secondary | ICD-10-CM | POA: Diagnosis not present

## 2020-05-23 DIAGNOSIS — D508 Other iron deficiency anemias: Secondary | ICD-10-CM

## 2020-05-24 LAB — CBC WITH DIFFERENTIAL/PLATELET
Absolute Monocytes: 564 cells/uL (ref 200–950)
Basophils Absolute: 19 cells/uL (ref 0–200)
Basophils Relative: 0.4 %
Eosinophils Absolute: 221 cells/uL (ref 15–500)
Eosinophils Relative: 4.7 %
HCT: 38.7 % (ref 35.0–45.0)
Hemoglobin: 12.8 g/dL (ref 11.7–15.5)
Lymphs Abs: 870 cells/uL (ref 850–3900)
MCH: 31.1 pg (ref 27.0–33.0)
MCHC: 33.1 g/dL (ref 32.0–36.0)
MCV: 93.9 fL (ref 80.0–100.0)
MPV: 10.9 fL (ref 7.5–12.5)
Monocytes Relative: 12 %
Neutro Abs: 3027 cells/uL (ref 1500–7800)
Neutrophils Relative %: 64.4 %
Platelets: 167 10*3/uL (ref 140–400)
RBC: 4.12 10*6/uL (ref 3.80–5.10)
RDW: 12.8 % (ref 11.0–15.0)
Total Lymphocyte: 18.5 %
WBC: 4.7 10*3/uL (ref 3.8–10.8)

## 2020-05-24 LAB — IRON,TIBC AND FERRITIN PANEL
%SAT: 31 % (calc) (ref 16–45)
Ferritin: 54 ng/mL (ref 16–288)
Iron: 82 ug/dL (ref 45–160)
TIBC: 266 mcg/dL (calc) (ref 250–450)

## 2020-05-24 LAB — COMPREHENSIVE METABOLIC PANEL
AG Ratio: 1.9 (calc) (ref 1.0–2.5)
ALT: 15 U/L (ref 6–29)
AST: 28 U/L (ref 10–35)
Albumin: 3.5 g/dL — ABNORMAL LOW (ref 3.6–5.1)
Alkaline phosphatase (APISO): 74 U/L (ref 37–153)
BUN: 12 mg/dL (ref 7–25)
CO2: 32 mmol/L (ref 20–32)
Calcium: 9.3 mg/dL (ref 8.6–10.4)
Chloride: 105 mmol/L (ref 98–110)
Creat: 0.6 mg/dL (ref 0.60–0.88)
Globulin: 1.8 g/dL (calc) — ABNORMAL LOW (ref 1.9–3.7)
Glucose, Bld: 83 mg/dL (ref 65–139)
Potassium: 3.8 mmol/L (ref 3.5–5.3)
Sodium: 142 mmol/L (ref 135–146)
Total Bilirubin: 0.8 mg/dL (ref 0.2–1.2)
Total Protein: 5.3 g/dL — ABNORMAL LOW (ref 6.1–8.1)

## 2020-05-24 LAB — TSH: TSH: 2.54 mIU/L (ref 0.40–4.50)

## 2020-05-24 LAB — PREALBUMIN: Prealbumin: 13 mg/dL — ABNORMAL LOW (ref 17–34)

## 2020-05-26 ENCOUNTER — Telehealth: Payer: Self-pay

## 2020-05-26 NOTE — Telephone Encounter (Signed)
Benefit verification submitted. Pending review. Last injection 02/12/20 Next injection after 08/11/2020

## 2020-05-27 ENCOUNTER — Other Ambulatory Visit: Payer: Self-pay | Admitting: Orthopedic Surgery

## 2020-05-28 ENCOUNTER — Other Ambulatory Visit: Payer: Medicare Other

## 2020-05-30 ENCOUNTER — Ambulatory Visit (INDEPENDENT_AMBULATORY_CARE_PROVIDER_SITE_OTHER): Payer: Medicare Other | Admitting: Orthopedic Surgery

## 2020-05-30 ENCOUNTER — Other Ambulatory Visit: Payer: Self-pay | Admitting: Family Medicine

## 2020-05-30 ENCOUNTER — Other Ambulatory Visit: Payer: Self-pay

## 2020-05-30 ENCOUNTER — Encounter: Payer: Self-pay | Admitting: Orthopedic Surgery

## 2020-05-30 VITALS — BP 130/70 | HR 79 | Temp 97.9°F | Resp 20 | Ht 59.0 in | Wt 106.4 lb

## 2020-05-30 DIAGNOSIS — M810 Age-related osteoporosis without current pathological fracture: Secondary | ICD-10-CM

## 2020-05-30 DIAGNOSIS — M8949 Other hypertrophic osteoarthropathy, multiple sites: Secondary | ICD-10-CM

## 2020-05-30 DIAGNOSIS — I10 Essential (primary) hypertension: Secondary | ICD-10-CM | POA: Diagnosis not present

## 2020-05-30 DIAGNOSIS — E782 Mixed hyperlipidemia: Secondary | ICD-10-CM | POA: Diagnosis not present

## 2020-05-30 DIAGNOSIS — R42 Dizziness and giddiness: Secondary | ICD-10-CM | POA: Diagnosis not present

## 2020-05-30 DIAGNOSIS — D508 Other iron deficiency anemias: Secondary | ICD-10-CM | POA: Diagnosis not present

## 2020-05-30 DIAGNOSIS — F0390 Unspecified dementia without behavioral disturbance: Secondary | ICD-10-CM

## 2020-05-30 DIAGNOSIS — M159 Polyosteoarthritis, unspecified: Secondary | ICD-10-CM

## 2020-05-30 DIAGNOSIS — H9193 Unspecified hearing loss, bilateral: Secondary | ICD-10-CM

## 2020-05-30 DIAGNOSIS — F03A0 Unspecified dementia, mild, without behavioral disturbance, psychotic disturbance, mood disturbance, and anxiety: Secondary | ICD-10-CM

## 2020-05-30 DIAGNOSIS — K7469 Other cirrhosis of liver: Secondary | ICD-10-CM | POA: Diagnosis not present

## 2020-05-30 DIAGNOSIS — K219 Gastro-esophageal reflux disease without esophagitis: Secondary | ICD-10-CM

## 2020-05-30 NOTE — Patient Instructions (Signed)
Preventive Care 45 Years and Older, Female Preventive care refers to lifestyle choices and visits with your health care provider that can promote health and wellness. This includes:  A yearly physical exam. This is also called an annual wellness visit.  Regular dental and eye exams.  Immunizations.  Screening for certain conditions.  Healthy lifestyle choices, such as: ? Eating a healthy diet. ? Getting regular exercise. ? Not using drugs or products that contain nicotine and tobacco. ? Limiting alcohol use. What can I expect for my preventive care visit? Physical exam Your health care provider will check your:  Height and weight. These may be used to calculate your BMI (body mass index). BMI is a measurement that tells if you are at a healthy weight.  Heart rate and blood pressure.  Body temperature.  Skin for abnormal spots. Counseling Your health care provider may ask you questions about your:  Past medical problems.  Family's medical history.  Alcohol, tobacco, and drug use.  Emotional well-being.  Home life and relationship well-being.  Sexual activity.  Diet, exercise, and sleep habits.  History of falls.  Memory and ability to understand (cognition).  Work and work Statistician.  Pregnancy and menstrual history.  Access to firearms. What immunizations do I need? Vaccines are usually given at various ages, according to a schedule. Your health care provider will recommend vaccines for you based on your age, medical history, and lifestyle or other factors, such as travel or where you work.   What tests do I need? Blood tests  Lipid and cholesterol levels. These may be checked every 5 years, or more often depending on your overall health.  Hepatitis C test.  Hepatitis B test. Screening  Lung cancer screening. You may have this screening every year starting at age 40 if you have a 30-pack-year history of smoking and currently smoke or have quit  within the past 15 years.  Colorectal cancer screening. ? All adults should have this screening starting at age 33 and continuing until age 70. ? Your health care provider may recommend screening at age 34 if you are at increased risk. ? You will have tests every 1-10 years, depending on your results and the type of screening test.  Diabetes screening. ? This is done by checking your blood sugar (glucose) after you have not eaten for a while (fasting). ? You may have this done every 1-3 years.  Mammogram. ? This may be done every 1-2 years. ? Talk with your health care provider about how often you should have regular mammograms.  Abdominal aortic aneurysm (AAA) screening. You may need this if you are a current or former smoker.  BRCA-related cancer screening. This may be done if you have a family history of breast, ovarian, tubal, or peritoneal cancers. Other tests  STD (sexually transmitted disease) testing, if you are at risk.  Bone density scan. This is done to screen for osteoporosis. You may have this done starting at age 16. Talk with your health care provider about your test results, treatment options, and if necessary, the need for more tests. Follow these instructions at home: Eating and drinking  Eat a diet that includes fresh fruits and vegetables, whole grains, lean protein, and low-fat dairy products. Limit your intake of foods with high amounts of sugar, saturated fats, and salt.  Take vitamin and mineral supplements as recommended by your health care provider.  Do not drink alcohol if your health care provider tells you not to drink.  If you drink alcohol: ? Limit how much you have to 0-1 drink a day. ? Be aware of how much alcohol is in your drink. In the [place] one drink equals one 12 oz bottle of beer (355 mL), one 5 oz glass of wine (148 mL), or one 1 oz glass of hard liquor (44 mL).   Lifestyle  Take daily care of your teeth and gums. Brush your teeth every  morning and night with fluoride toothpaste. Floss one time each day.  Stay active. Exercise for at least 30 minutes 5 or more days each week.  Do not use any products that contain nicotine or tobacco, such as cigarettes, e-cigarettes, and chewing tobacco. If you need help quitting, ask your health care provider.  Do not use drugs.  If you are sexually active, practice safe sex. Use a condom or other form of protection in order to prevent STIs (sexually transmitted infections).  Talk with your health care provider about taking a low-dose aspirin or statin.  Find healthy ways to cope with stress, such as: ? Meditation, yoga, or listening to music. ? Journaling. ? Talking to a trusted person. ? Spending time with friends and family. Safety  Always wear your seat belt while driving or riding in a vehicle.  Do not drive: ? If you have been drinking alcohol. Do not ride with someone who has been drinking. ? When you are tired or distracted. ? While texting.  Wear a helmet and other protective equipment during sports activities.  If you have firearms in your house, make sure you follow all gun safety procedures. What's next?  Visit your health care provider once a year for an annual wellness visit.  Ask your health care provider how often you should have your eyes and teeth checked.  Stay up to date on all vaccines. This information is not intended to replace advice given to you by your health care provider. Make sure you discuss any questions you have with your health care provider. Document Revised: 02/20/2020 Document Reviewed: 02/23/2018 Elsevier Patient Education  2021 [employer].
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
## 2020-06-02 NOTE — Telephone Encounter (Signed)
Next Prolia injection due in May. Please schedule.

## 2020-06-02 NOTE — Telephone Encounter (Signed)
Spoke with patients daughter who stated that she recently changed PCP. Sending to new PCP to schedule accordingly with their office. Informed daughter of OOP cost of $280. Patients daughter comfortable with moving forward with getting injection. Please advise.

## 2020-06-02 NOTE — Telephone Encounter (Signed)
Benefit verification received. OOP cost $280. No PA needed. Please schedule patient for lab and NV appointment.

## 2020-06-02 NOTE — Telephone Encounter (Signed)
Would you like this to be given at your office?

## 2020-06-03 NOTE — Telephone Encounter (Signed)
Yes she may schedule at our office.

## 2020-06-03 NOTE — Telephone Encounter (Signed)
Forwarding to supervisor at [facility] as Cardell Peach does not work in that office.

## 2020-08-13 ENCOUNTER — Other Ambulatory Visit: Payer: Self-pay

## 2020-08-13 ENCOUNTER — Ambulatory Visit: Payer: Medicare Other | Admitting: *Deleted

## 2020-08-13 DIAGNOSIS — M810 Age-related osteoporosis without current pathological fracture: Secondary | ICD-10-CM

## 2020-08-13 MED ORDER — DENOSUMAB 60 MG/ML ~~LOC~~ SOSY
60.0000 mg | PREFILLED_SYRINGE | Freq: Once | SUBCUTANEOUS | Status: AC
Start: 2020-08-13 — End: 2020-08-13
  Administered 2020-08-13: 60 mg via SUBCUTANEOUS

## 2020-08-21 ENCOUNTER — Other Ambulatory Visit: Payer: Self-pay

## 2020-08-21 ENCOUNTER — Encounter: Payer: Self-pay | Admitting: Adult Health

## 2020-08-21 ENCOUNTER — Ambulatory Visit (INDEPENDENT_AMBULATORY_CARE_PROVIDER_SITE_OTHER): Payer: Medicare Other | Admitting: Adult Health

## 2020-08-21 VITALS — BP 118/80 | HR 63 | Temp 97.3°F | Resp 16 | Ht 59.0 in | Wt 104.8 lb

## 2020-08-21 DIAGNOSIS — R600 Localized edema: Secondary | ICD-10-CM | POA: Diagnosis not present

## 2020-08-21 DIAGNOSIS — R42 Dizziness and giddiness: Secondary | ICD-10-CM

## 2020-08-21 DIAGNOSIS — N3000 Acute cystitis without hematuria: Secondary | ICD-10-CM

## 2020-08-21 DIAGNOSIS — R2681 Unsteadiness on feet: Secondary | ICD-10-CM

## 2020-08-21 LAB — CBC WITH DIFFERENTIAL/PLATELET
Absolute Monocytes: 627 cells/uL (ref 200–950)
Basophils Absolute: 29 cells/uL (ref 0–200)
Basophils Relative: 0.6 %
Eosinophils Absolute: 147 cells/uL (ref 15–500)
Eosinophils Relative: 3 %
HCT: 34.9 % — ABNORMAL LOW (ref 35.0–45.0)
Hemoglobin: 11.6 g/dL — ABNORMAL LOW (ref 11.7–15.5)
Lymphs Abs: 1274 cells/uL (ref 850–3900)
MCH: 30.8 pg (ref 27.0–33.0)
MCHC: 33.2 g/dL (ref 32.0–36.0)
MCV: 92.6 fL (ref 80.0–100.0)
MPV: 10.5 fL (ref 7.5–12.5)
Monocytes Relative: 12.8 %
Neutro Abs: 2822 cells/uL (ref 1500–7800)
Neutrophils Relative %: 57.6 %
Platelets: 165 10*3/uL (ref 140–400)
RBC: 3.77 10*6/uL — ABNORMAL LOW (ref 3.80–5.10)
RDW: 13.1 % (ref 11.0–15.0)
Total Lymphocyte: 26 %
WBC: 4.9 10*3/uL (ref 3.8–10.8)

## 2020-08-21 LAB — BASIC METABOLIC PANEL
BUN: 8 mg/dL (ref 7–25)
CO2: 28 mmol/L (ref 20–32)
Calcium: 7.8 mg/dL — ABNORMAL LOW (ref 8.6–10.4)
Chloride: 107 mmol/L (ref 98–110)
Creat: 0.63 mg/dL (ref 0.60–0.88)
Glucose, Bld: 79 mg/dL (ref 65–139)
Potassium: 3.5 mmol/L (ref 3.5–5.3)
Sodium: 142 mmol/L (ref 135–146)

## 2020-08-21 LAB — POCT URINALYSIS DIPSTICK
Bilirubin, UA: NEGATIVE
Glucose, UA: NEGATIVE
Ketones, UA: NEGATIVE
Nitrite, UA: POSITIVE
Protein, UA: POSITIVE — AB
Spec Grav, UA: 1.01 (ref 1.010–1.025)
Urobilinogen, UA: >=8 E.U./dL — AB
pH, UA: 7.5 (ref 5.0–8.0)

## 2020-08-21 MED ORDER — SACCHAROMYCES BOULARDII 250 MG PO CAPS: 250.0000 mg | ORAL_CAPSULE | Freq: Two times a day (BID) | ORAL | 0 refills | Status: AC

## 2020-08-21 MED ORDER — CIPROFLOXACIN HCL 500 MG PO TABS: 500.0000 mg | ORAL_TABLET | Freq: Two times a day (BID) | ORAL | 0 refills | Status: AC

## 2020-08-21 NOTE — Progress Notes (Signed)
[facility]  Provider:   Durenda Age  DNP  Code Status: Full Code  Goals of Care:  Advanced Directives 08/21/2020  Does Patient Have a Medical Advance Directive? Yes  Type of Paramedic of [place];Living will  Does patient want to make changes to medical advance directive? No - Patient declined  Copy of [place] in Chart? Yes - validated most recent copy scanned in chart (See row information)  Would patient like information on creating a medical advance directive? -  Pre-existing out of facility DNR order (yellow form or pink MOST form) -     Chief Complaint  Patient presents with   Acute Visit    Complains of no energy, dizzy spells and swelling in ankles/feet.    HPI: Patient is a 87 y.o. female seen today for an acute visit for having no energy, dizzy spells and swelling of bilateral ankles. She was accompanied today by her daughter. She lives alone in her house but daughter lives 3 minutes away and checks on her daily. Patient has chronic dizzy spells for years but noted that it has become more frequent and lasts longer according to daughter. Daughter noticed that she has bilateral ankle swelling which started 5 days ago. She has poor appetite.   Past Medical History:  Diagnosis Date   Arthritis    Choledocholithiasis    Cirrhosis, cryptogenic (HCC)    FOLLOWED BY DR PYRTLE-- LOV  IN EPIC--  STABLE PER NOTE   Common bile duct stone    Cryptogenic cirrhosis (HCC)    DDD (degenerative disc disease), lumbar    Diverticulosis    Esophageal varix (HCC)    GERD (gastroesophageal reflux disease)    Hiatal hernia    History of colonoscopy with polypectomy    ADENOMATOUS AND HYPERPLASIA POLYPS  (2008  &  2010)   History of hepatitis C    History of positive PPD    + TB SKIN TEST WITHOUT TB   Hyperlipidemia    Hypertension, portal (HCC)    SECONDARY TO CRYPTOGENIC CIRRHOSIS   Internal hemorrhoids    Iron deficiency  anemia    Loss of hearing    right ear   Osteoarthritis    hands, knees, and low back   Osteoporosis    Portal hypertension (HCC)    PUD (peptic ulcer disease)    Squamous cell carcinoma of skin 04/07/2017   left cheek   Tubular adenoma of colon    Uterovaginal prolapse, incomplete    Wears glasses    Wears hearing aid    left ear only    Past Surgical History:  Procedure Laterality Date   APPENDECTOMY  1960'S   CHOLECYSTECTOMY  08/25/2011   Procedure: LAPAROSCOPIC CHOLECYSTECTOMY WITH INTRAOPERATIVE CHOLANGIOGRAM;  Surgeon: Zenovia Jarred, MD;  Location: [place];  Service: General;  Laterality: N/A;   ENDOSCOPIC RETROGRADE CHOLANGIOPANCREATOGRAPHY (ERCP) WITH PROPOFOL N/A 11/30/2012   Procedure: ENDOSCOPIC RETROGRADE CHOLANGIOPANCREATOGRAPHY (ERCP) WITH PROPOFOL;  Surgeon: Milus Banister, MD;  [facility];  Service: Endoscopy;  Laterality: N/A;   ESOPHAGOGASTRODUODENOSCOPY N/A 05/18/2018   Procedure: ESOPHAGOGASTRODUODENOSCOPY (EGD);  Surgeon: Lin Landsman, MD;  [facility] ENDOSCOPY;  Service: Gastroenterology;  Laterality: N/A;   EXTERNAL EAR SURGERY Left 1980   HYSTEROSCOPY WITH D & C N/A 07/15/2014   Procedure: DILATATION AND CURETTAGE /HYSTEROSCOPY;  Surgeon: Molli Posey, MD;  Location: [place];  Service: Gynecology;  Laterality: N/A;   INTRAMEDULLARY (IM) NAIL INTERTROCHANTERIC  Left 01/22/2018   Procedure: ORIF LEFT INTERTROCHANTRIC HIP FX;  Surgeon: Mcarthur Rossetti, MD;  Location: [place];  Service: Orthopedics;  Laterality: Left;   KNEE ARTHROSCOPY W/ MENISCECTOMY Right 06-19-2002   LUMBAR DISC SURGERY  1990   REMOVAL OF URINARY SLING N/A 09/13/2013   Procedure: REMOVAL OF RETAINED PESSARY;  Surgeon: Jorja Loa, MD;  Location: [facility];  Service: Urology;  Laterality: N/A;    No Known Allergies  Outpatient Encounter Medications as of 08/21/2020  Medication Sig   Calcium-Phosphorus-Vitamin D (CALCIUM  GUMMIES PO) Take 1 tablet by mouth daily.   cetirizine (ZYRTEC) 10 MG tablet Take 10 mg by mouth as needed for allergies.   Cholecalciferol (VITAMIN D3) 50 MCG (2000 UT) CAPS Take 1 capsule by mouth daily in the afternoon.   denosumab (PROLIA) 60 MG/ML SOSY injection Inject 60 mg into the skin every 6 (six) months.   dextromethorphan-guaiFENesin (MUCINEX DM) 30-600 MG 12hr tablet Take 1 tablet by mouth 2 (two) times daily.   dimenhyDRINATE (DRAMAMINE) 50 MG tablet Take 50 mg by mouth as needed.   ibuprofen (ADVIL) 200 MG tablet Take 200 mg by mouth as needed.   Iron Combinations (IRON COMPLEX PO) Take 1 tablet by mouth daily.   memantine (NAMENDA) 10 MG tablet TAKE 1 TABLET BY MOUTH TWICE A DAY   ondansetron (ZOFRAN) 4 MG tablet Take 4 mg by mouth every 8 (eight) hours as needed for nausea or vomiting.   pantoprazole (PROTONIX) 40 MG tablet TAKE 1 TABLET BY MOUTH DAILY BEFORE BREAKFAST   sennosides-docusate sodium (SENOKOT-S) 8.6-50 MG tablet Take 1 tablet by mouth daily as needed for constipation.   timolol (TIMOPTIC) 0.5 % ophthalmic solution Place 1 drop into both eyes 2 (two) times daily.    traMADol (ULTRAM) 50 MG tablet TAKE ONE-HALF TO ONE TABLET BY MOUTH UP TO EVERY 12 HOURS AS NEEDED FOR MODERATE PAIN.   traZODone (DESYREL) 150 MG tablet TAKE 1 TABLET BY MOUTH EVERYDAY AT BEDTIME   vitamin E 400 UNIT capsule Take 400 Units by mouth daily.   [DISCONTINUED] Chlorphen-Phenyleph-ASA (ALKA-SELTZER PLUS COLD PO) Take 1 tablet by mouth as needed.   [DISCONTINUED] diclofenac Sodium (VOLTAREN) 1 % GEL Apply 2 g topically at bedtime.   [DISCONTINUED] simethicone (MYLICON) 253 MG chewable tablet Chew 125 mg by mouth as needed for flatulence.   No facility-administered encounter medications on file as of 08/21/2020.    Review of Systems:  Review of Systems  Constitutional:  Negative for activity change, chills and fever.  HENT:  Negative for congestion.        Wakes up with a lot phlegm in her  throat  Eyes:  Negative for discharge and itching.  Respiratory:  Negative for cough.   Cardiovascular:  Positive for leg swelling.  Gastrointestinal:  Negative for nausea and vomiting.  Genitourinary:  Negative for difficulty urinating, dysuria and urgency.  Musculoskeletal:  Positive for back pain and gait problem.       Gait problem, uses cane when walking  Neurological:  Positive for dizziness, weakness and headaches.  Psychiatric/Behavioral: Negative.     Health Maintenance  Topic Date Due   Pneumococcal Vaccine 58-43 Years old (1 - PCV) Never done   Zoster Vaccines- Shingrix (1 of 2) Never done   INFLUENZA VACCINE  10/13/2020   COVID-19 Vaccine (5 - Booster for Pfizer series) 10/19/2020   MAMMOGRAM  05/06/2021   TETANUS/TDAP  02/26/2027   DEXA SCAN  Completed  PNA vac Low Risk Adult  Completed   HPV VACCINES  Aged Out    Physical Exam: Vitals:   08/21/20 1429  BP: 118/80  Pulse: 63  Resp: 16  Temp: (!) 97.3 F (36.3 C)  SpO2: 98%  Weight: 104 lb 12.8 oz (47.5 kg)  Height: 4\' 11"  (1.499 m)   Body mass index is 21.17 kg/m. Physical Exam Constitutional:      Comments: Disoriented to time.  HENT:     Mouth/Throat:     Mouth: Mucous membranes are moist.  Eyes:     Conjunctiva/sclera: Conjunctivae normal.  Cardiovascular:     Rate and Rhythm: Normal rate and regular rhythm.     Pulses: Normal pulses.     Heart sounds: Normal heart sounds.  Pulmonary:     Effort: Pulmonary effort is normal.     Breath sounds: Normal breath sounds.  Abdominal:     General: Bowel sounds are normal.     Palpations: Abdomen is soft.  Musculoskeletal:        General: Swelling present.     Cervical back: Normal range of motion.     Comments: 1+edema ankle  Skin:    General: Skin is warm.  Neurological:     General: No focal deficit present.     Mental Status: She is alert. Mental status is at baseline.  Psychiatric:        Mood and Affect: Mood normal.        Behavior:  Behavior normal.    Labs reviewed: Basic Metabolic Panel: Recent Labs    12/21/19 1230 01/09/20 0754 05/23/20 1004  NA 142 141 142  K 4.2 4.0 3.8  CL 107 105 105  CO2 28 31 32  GLUCOSE 124* 99 83  BUN 11 7 12   CREATININE 0.68 0.52 0.60  CALCIUM 8.6 8.7 9.3  TSH  --   --  2.54   Liver Function Tests: Recent Labs    12/21/19 1230 01/09/20 0754 05/23/20 1004  AST 33 40* 28  ALT 19 24 15   ALKPHOS 72 79  --   BILITOT 0.8 0.9 0.8  PROT 5.9* 5.5* 5.3*  ALBUMIN 3.6 3.7  --     CBC: Recent Labs    12/21/19 1230 01/09/20 0754 05/23/20 1004  WBC 4.4 3.8* 4.7  NEUTROABS 2.6 2.2 3,027  HGB 13.0 12.9 12.8  HCT 38.9 37.9 38.7  MCV 92.6 92.2 93.9  PLT 145.0* 146.0* 167    Lab Results  Component Value Date   HGBA1C 4.9 01/18/2014      Assessment/Plan  1. Vertigo  -  continue Dramamine PRN -   discussed to take orthostatic BP and record - CBC with Differential/Platelets - Basic Metabolic Panel - Urinalysis, dipstick only  2. Unsteady gait - Ambulatory referral to Home Health PT -    for therapeutic strengthening exercises  3. Lower extremity edema -  3+ bilateral pedal pulse -  instructed to put on bilateral compression stockings in  the morning and off at bedtime -  elevate feet when sitting for a long time and at night  4.  Acute cystitis without hematuria -    urine dipstick showed clarity is cloudy, blood is large, protein positive, nitrate positive and leukocyte large -   will start on ciprofloxacin 500 mg twice a day x7 days and Florastor 250 mg 1 capsule twice a day x10 days  Labs/tests ordered:   CBC, BMP, Urine dipstick, urine culture   Next appt:

## 2020-08-21 NOTE — Patient Instructions (Addendum)
Urinary Tract Infection, Adult A urinary tract infection (UTI) is an infection of any part of the urinary tract. The urinary tract includes: The kidneys. The ureters. The bladder. The urethra. These organs make, store, and get rid of pee (urine) in the body. What are the causes? This infection is caused by germs (bacteria) in your genital area. These germs grow and cause swelling (inflammation) of your urinary tract. What increases the risk? The following factors may make you more likely to develop this condition: Using a small, thin tube (catheter) to drain pee. Not being able to control when you pee or poop (incontinence). Being female. If you are female, these things can increase the risk: Using these methods to prevent pregnancy: A medicine that kills sperm (spermicide). A device that blocks sperm (diaphragm). Having low levels of a female hormone (estrogen). Being pregnant. You are more likely to develop this condition if: You have genes that add to your risk. You are sexually active. You take antibiotic medicines. You have trouble peeing because of: A prostate that is bigger than normal, if you are female. A blockage in the part of your body that drains pee from the bladder. A kidney stone. A nerve condition that affects your bladder. Not getting enough to drink. Not peeing often enough. You have other conditions, such as: Diabetes. A weak disease-fighting system (immune system). Sickle cell disease. Gout. Injury of the spine. What are the signs or symptoms? Symptoms of this condition include: Needing to pee right away. Peeing small amounts often. Pain or burning when peeing. Blood in the pee. Pee that smells bad or not like normal. Trouble peeing. Pee that is cloudy. Fluid coming from the vagina, if you are female. Pain in the belly or lower back. Other symptoms include: Vomiting. Not feeling hungry. Feeling mixed up (confused). This may be the first symptom in  older adults. Being tired and grouchy (irritable). A fever. Watery poop (diarrhea). How is this treated? Taking antibiotic medicine. Taking other medicines. Drinking enough water. In some cases, you may need to see a specialist. Follow these instructions at home: Medicines Take over-the-counter and prescription medicines only as told by your doctor. If you were prescribed an antibiotic medicine, take it as told by your doctor. Do not stop taking it even if you start to feel better. General instructions Make sure you: Pee until your bladder is empty. Do not hold pee for a long time. Empty your bladder after sex. Wipe from front to back after peeing or pooping if you are a female. Use each tissue one time when you wipe. Drink enough fluid to keep your pee pale yellow. Keep all follow-up visits.   Contact a doctor if: You do not get better after 1-2 days. Your symptoms go away and then come back. Get help right away if: You have very bad back pain. You have very bad pain in your lower belly. You have a fever. You have chills. You feeling like you will vomit or you vomit. Summary A urinary tract infection (UTI) is an infection of any part of the urinary tract. This condition is caused by germs in your genital area. There are many risk factors for a UTI. Treatment includes antibiotic medicines. Drink enough fluid to keep your pee pale yellow. This information is not intended to replace advice given to you by your health care provider. Make sure you discuss any questions you have with your health care provider. Document Revised: 10/12/2019 Document Reviewed: 10/12/2019 Elsevier Patient Education  [place].  Vertigo Vertigo is the feeling that you or the things around you are moving when they are not. This feeling can come and go at any time. Vertigo often goes away on its own. This condition can be dangerous if it happens when you are doing activities like driving or  working with machines. Your doctor will do tests to find the cause of your vertigo. These tests will also help your doctor decide on the best treatment for you. Follow these instructions at home: Eating and drinking Drink enough fluid to keep your pee (urine) pale yellow. Do not drink alcohol.      Activity Return to your normal activities as told by your doctor. Ask your doctor what activities are safe for you. In the morning, first sit up on the side of the bed. When you feel okay, stand slowly while you hold onto something until you know that your balance is fine. Move slowly. Avoid sudden body or head movements or certain positions, as told by your doctor. Use a cane if you have trouble standing or walking. Sit down right away if you feel dizzy. Avoid doing any tasks or activities that can cause danger to you or others if you get dizzy. Avoid bending down if you feel dizzy. Place items in your home so that they are easy for you to reach without leaning over. Do not drive or use heavy machinery if you feel dizzy. General instructions Take over-the-counter and prescription medicines only as told by your doctor. Keep all follow-up visits as told by your doctor. This is important. Contact a doctor if: Your medicine does not help your vertigo. You have a fever. Your problems get worse or you have new symptoms. Your family or friends see changes in your behavior. The feeling of being sick to your stomach gets worse. Your vomiting gets worse. You lose feeling (have numbness) in part of your body. You feel prickling and tingling in a part of your body. Get help right away if: You have trouble moving or talking. You are always dizzy. You pass out (faint). You get very bad headaches. You feel weak in your hands, arms, or legs. You have changes in your hearing. You have changes in how you see (vision). You get a stiff neck. Bright light starts to bother you. Summary Vertigo is the  feeling that you or the things around you are moving when they are not. Your doctor will do tests to find the cause of your vertigo. You may be told to avoid some tasks, positions, or movements. Contact a doctor if your medicine is not helping, or if you have a fever, new symptoms, or a change in behavior. Get help right away if you get very bad headaches, or if you have changes in how you speak, hear, or see. This information is not intended to replace advice given to you by your health care provider. Make sure you discuss any questions you have with your health care provider. Document Revised: 01/23/2018 Document Reviewed: 01/23/2018 Elsevier Patient Education  2021 [employer].

## 2020-08-22 NOTE — Progress Notes (Signed)
Calcium is 7.8  is slightly low (normal 8-6 - 10.4). This might be due to Prolia side effect. Calcium level may be repeated in a month to monitor level.  Hemoglobin slightly down to 11.6 from 12.8, normal level 11.7 - 15.5

## 2020-08-25 ENCOUNTER — Emergency Department
Admission: EM | Admit: 2020-08-25 | Discharge: 2020-08-25 | Disposition: A | Discharge status: Home / Self Care | Payer: Medicare Other | Attending: Emergency Medicine | Admitting: Emergency Medicine

## 2020-08-25 ENCOUNTER — Emergency Department: Payer: Medicare Other

## 2020-08-25 ENCOUNTER — Other Ambulatory Visit: Payer: Self-pay

## 2020-08-25 DIAGNOSIS — I10 Essential (primary) hypertension: Secondary | ICD-10-CM | POA: Insufficient documentation

## 2020-08-25 DIAGNOSIS — M25571 Pain in right ankle and joints of right foot: Secondary | ICD-10-CM | POA: Insufficient documentation

## 2020-08-25 DIAGNOSIS — M25471 Effusion, right ankle: Secondary | ICD-10-CM | POA: Insufficient documentation

## 2020-08-25 DIAGNOSIS — Z85828 Personal history of other malignant neoplasm of skin: Secondary | ICD-10-CM | POA: Diagnosis not present

## 2020-08-25 DIAGNOSIS — F0390 Unspecified dementia without behavioral disturbance: Secondary | ICD-10-CM | POA: Insufficient documentation

## 2020-08-25 DIAGNOSIS — M7989 Other specified soft tissue disorders: Secondary | ICD-10-CM | POA: Diagnosis not present

## 2020-08-25 MED ORDER — TRAMADOL HCL 50 MG PO TABS
50.0000 mg | ORAL_TABLET | Freq: Once | ORAL | Status: AC
  Administered 2020-08-25: 15:00:00 50 mg via ORAL
  Filled 2020-08-25: qty 1

## 2020-08-25 MED ORDER — TRAMADOL HCL 50 MG PO TABS: 50.0000 mg | ORAL_TABLET | Freq: Two times a day (BID) | ORAL | 0 refills | Status: AC

## 2020-08-25 NOTE — Discharge Instructions (Addendum)
Brianna Barnes has a normal exam, XR, and DVT study. There are no other serious or concerning findings. Follow-up with your PCP or Dr. Mack Guise as needed for ongoing symptoms.

## 2020-08-25 NOTE — ED Provider Notes (Signed)
[facility] Emergency Department Provider Note ____________________________________________  Time seen: 1250  I have reviewed the triage vital signs and the nursing notes.  HISTORY  Chief Complaint  DVT  Interim history provided by patient's adult daughter.  Patient is extremely hard of hearing.  HPI Brianna Barnes is a 87 y.o. female with the below medical history, presents to the ED accompanied by her adult daughter, for evaluation of right ankle pain and disability.  The patient's daughter would endorse several days of pain and swelling to the right ankle, but it was this morning when the patient noted exquisite pain with attempts to ambulate, and refused to bear weight on the ankle.  There is no reported fall, trauma, or twisting injury.  There is also no history of any chronic ongoing ankle swelling.  Patient with a history of right leg clot as a postoperative complication following her right total hip replacement.  She was on a prescription blood thinner for several months until resolution.  No current blood thinner therapy is noted.  She is also on a course of Cipro for a diagnosed UTI.  Past Medical History:  Diagnosis Date   Arthritis    Choledocholithiasis    Cirrhosis, cryptogenic (HCC)    FOLLOWED BY DR PYRTLE-- LOV  IN EPIC--  STABLE PER NOTE   Common bile duct stone    Cryptogenic cirrhosis (HCC)    DDD (degenerative disc disease), lumbar    Diverticulosis    Esophageal varix (HCC)    GERD (gastroesophageal reflux disease)    Hiatal hernia    History of colonoscopy with polypectomy    ADENOMATOUS AND HYPERPLASIA POLYPS  (2008  &  2010)   History of hepatitis C    History of positive PPD    + TB SKIN TEST WITHOUT TB   Hyperlipidemia    Hypertension, portal (HCC)    SECONDARY TO CRYPTOGENIC CIRRHOSIS   Internal hemorrhoids    Iron deficiency anemia    Loss of hearing    right ear   Osteoarthritis    hands, knees, and low back    Osteoporosis    Portal hypertension (HCC)    PUD (peptic ulcer disease)    Squamous cell carcinoma of skin 04/07/2017   left cheek   Tubular adenoma of colon    Uterovaginal prolapse, incomplete    Wears glasses    Wears hearing aid    left ear only    Patient Active Problem List   Diagnosis Date Noted   Allergic rhinitis 08/22/2019   Mild dementia ([place]) 01/12/2019   History of DVT (deep vein thrombosis) 03/09/2018   Carotid artery calcification, bilateral 01/21/2018   S/p left hip fracture 01/21/2018   Situational anxiety 11/04/2017   Chronic insomnia 04/03/2016   Malnutrition of moderate degree ([place]) 01/30/2016   Counseling regarding end of life decision making 01/25/2014   Choledocholithiasis 11/30/2012   Cryptogenic cirrhosis ([place]) 11/22/2012   Family history of early CAD 10/29/2010   Vitamin D deficiency 06/24/2009   Essential hypertension, benign 06/13/2009   Hyperlipidemia 06/06/2009   Iron deficiency anemia 09/17/2008   GERD 09/17/2008   PEPTIC ULCER DISEASE 09/17/2008   UTEROVAGINAL PROLAPSE, INCOMPLETE 09/17/2008   DJD (degenerative joint disease) 09/17/2008   Osteoporosis 09/17/2008   ARTERIOVENOUS MALFORMATION 09/17/2008   POSITIVE TB SKIN TEST, WITHOUT TUBERCULOSIS 09/17/2008   COLONIC POLYPS, BENIGN, HX OF 09/17/2008    Past Surgical History:  Procedure Laterality Date   APPENDECTOMY  1960'S  CHOLECYSTECTOMY  08/25/2011   Procedure: LAPAROSCOPIC CHOLECYSTECTOMY WITH INTRAOPERATIVE CHOLANGIOGRAM;  Surgeon: Zenovia Jarred, MD;  Location: [place];  Service: General;  Laterality: N/A;   ENDOSCOPIC RETROGRADE CHOLANGIOPANCREATOGRAPHY (ERCP) WITH PROPOFOL N/A 11/30/2012   Procedure: ENDOSCOPIC RETROGRADE CHOLANGIOPANCREATOGRAPHY (ERCP) WITH PROPOFOL;  Surgeon: Milus Banister, MD;  Location: [facility];  Service: Endoscopy;  Laterality: N/A;   ESOPHAGOGASTRODUODENOSCOPY N/A 05/18/2018   Procedure: ESOPHAGOGASTRODUODENOSCOPY (EGD);  Surgeon: Lin Landsman,  MD;  Location: [facility] ENDOSCOPY;  Service: Gastroenterology;  Laterality: N/A;   EXTERNAL EAR SURGERY Left 1980   HYSTEROSCOPY WITH D & C N/A 07/15/2014   Procedure: DILATATION AND CURETTAGE /HYSTEROSCOPY;  Surgeon: Molli Posey, MD;  Location: [place];  Service: Gynecology;  Laterality: N/A;   INTRAMEDULLARY (IM) NAIL INTERTROCHANTERIC Left 01/22/2018   Procedure: ORIF LEFT INTERTROCHANTRIC HIP FX;  Surgeon: Mcarthur Rossetti, MD;  Location: [place];  Service: Orthopedics;  Laterality: Left;   KNEE ARTHROSCOPY W/ MENISCECTOMY Right 06-19-2002   LUMBAR DISC SURGERY  1990   REMOVAL OF URINARY SLING N/A 09/13/2013   Procedure: REMOVAL OF RETAINED PESSARY;  Surgeon: Jorja Loa, MD;  Location: [facility];  Service: Urology;  Laterality: N/A;    Prior to Admission medications   Medication Sig Start Date End Date Taking? Authorizing Provider  traMADol (ULTRAM) 50 MG tablet Take 1 tablet (50 mg total) by mouth 2 (two) times daily for 5 days. 08/25/20 08/30/20 Yes Rikki Smestad, Dannielle Karvonen, PA-C  Calcium-Phosphorus-Vitamin D (CALCIUM GUMMIES PO) Take 1 tablet by mouth daily.    [provider]  cetirizine (ZYRTEC) 10 MG tablet Take 10 mg by mouth as needed for allergies.    [provider]  Cholecalciferol (VITAMIN D3) 50 MCG (2000 UT) CAPS Take 1 capsule by mouth daily in the afternoon.    [provider]  ciprofloxacin (CIPRO) 500 MG tablet Take 1 tablet (500 mg total) by mouth 2 (two) times daily for 7 days. 08/21/20 08/28/20  Medina-Vargas, Monina C, NP  denosumab (PROLIA) 60 MG/ML SOSY injection Inject 60 mg into the skin every 6 (six) months.    [provider]  dextromethorphan-guaiFENesin (MUCINEX DM) 30-600 MG 12hr tablet Take 1 tablet by mouth 2 (two) times daily.    [provider]  dimenhyDRINATE (DRAMAMINE) 50 MG tablet Take 50 mg by mouth as needed.    [provider]  ibuprofen (ADVIL) 200 MG  tablet Take 200 mg by mouth as needed.    [provider]  Iron Combinations (IRON COMPLEX PO) Take 1 tablet by mouth daily.    [provider]  memantine (NAMENDA) 10 MG tablet TAKE 1 TABLET BY MOUTH TWICE A DAY 02/22/20   Bedsole, [name] E, MD  ondansetron (ZOFRAN) 4 MG tablet Take 4 mg by mouth every 8 (eight) hours as needed for nausea or vomiting.    [provider]  pantoprazole (PROTONIX) 40 MG tablet TAKE 1 TABLET BY MOUTH DAILY BEFORE BREAKFAST 05/19/20   Fargo, [name] E, NP  saccharomyces boulardii (FLORASTOR) 250 MG capsule Take 1 capsule (250 mg total) by mouth 2 (two) times daily for 10 days. 08/21/20 08/31/20  Medina-Vargas, Monina C, NP  sennosides-docusate sodium (SENOKOT-S) 8.6-50 MG tablet Take 1 tablet by mouth daily as needed for constipation.    [provider]  timolol (TIMOPTIC) 0.5 % ophthalmic solution Place 1 drop into both eyes 2 (two) times daily.  01/22/15   [provider]  traMADol (ULTRAM) 50 MG  tablet TAKE ONE-HALF TO ONE TABLET BY MOUTH UP TO EVERY 12 HOURS AS NEEDED FOR MODERATE PAIN. 03/26/19   Bedsole, [name] E, MD  traZODone (DESYREL) 150 MG tablet TAKE 1 TABLET BY MOUTH EVERYDAY AT BEDTIME 05/30/20   Bedsole, [name] E, MD  vitamin E 400 UNIT capsule Take 400 Units by mouth daily.    [provider]    Allergies Patient has no known allergies.  Family History  Problem Relation Age of Onset   Heart disease Mother    Heart disease Father 37   Hyperlipidemia Father    Diabetes Father    Cirrhosis Sister    Diabetes Brother    Heart disease Brother    Cirrhosis Brother    Pancreatic cancer Brother    Colon cancer Neg Hx    Colon polyps Neg Hx    Esophageal cancer Neg Hx    Stomach cancer Neg Hx    Inflammatory bowel disease Neg Hx     Social History Social History   Tobacco Use   Smoking status: Never   Smokeless tobacco: Never  Vaping Use   Vaping Use: Never used  Substance Use Topics   Alcohol use: No    Drug use: No    Review of Systems  Constitutional: Negative for fever. Eyes: Negative for visual changes. ENT: Negative for sore throat. Cardiovascular: Negative for chest pain. Respiratory: Negative for shortness of breath. Gastrointestinal: Negative for abdominal pain, vomiting and diarrhea. Genitourinary: Negative for dysuria. Musculoskeletal: Negative for back pain.  Right ankle pain and swelling as above. Skin: Negative for rash. Neurological: Negative for headaches, focal weakness or numbness. ____________________________________________  PHYSICAL EXAM:  VITAL SIGNS: ED Triage Vitals  Enc Vitals Group     BP 08/25/20 1214 (!) 186/69     Pulse Rate 08/25/20 1214 76     Resp 08/25/20 1214 17     Temp 08/25/20 1214 98.7 F (37.1 C)     Temp src --      SpO2 08/25/20 1214 98 %     Weight --      Height --      Head Circumference --      Peak Flow --      Pain Score 08/25/20 1212 5     Pain Loc --      Pain Edu? --      Excl. in [place]? --     Constitutional: Alert and oriented. Well appearing and in no distress.  Head: Normocephalic and atraumatic. Eyes: Conjunctivae are normal. Normal extraocular movements Cardiovascular: Normal rate, regular rhythm. Normal distal pulses and cap refill Respiratory: Normal respiratory effort. No wheezes/rales/rhonchi. Gastrointestinal: Soft and nontender. No distention. Musculoskeletal: Right ankle with subtle soft tissue swelling to the medial lateral malleoli.  Patient able demonstrate normal ankle flexion and extension range.  No significant calf or Achilles tenderness is elicited.  Knee exam is benign.  Nontender with normal range of motion in all extremities.  Neurologic:  Normal gross sensation. Normal speech and language. No gross focal neurologic deficits are appreciated. Skin:  Skin is warm, dry and intact. No rash noted. Psychiatric: Mood and affect are normal. Patient exhibits appropriate insight and  judgment. ____________________________________________   RADIOLOGY  DG Right Ankle IMPRESSION: No acute osseous abnormality.  Venous US RLE IMPRESSION: No femoropopliteal DVT nor evidence of DVT within the visualized calf veins.   If clinical symptoms are inconsistent or if there are persistent or worsening symptoms, further imaging (possibly involving the iliac  veins) may be warranted. ____________________________________________  PROCEDURES  Ultram 50 mg PO  Procedures ____________________________________________   INITIAL IMPRESSION / ASSESSMENT AND PLAN / ED COURSE  As part of my medical decision making, I reviewed the following data within the [place] History obtained from family, Radiograph reviewed WNL, Notes from prior ED visits, and [place] Controlled Substance Database   DDX: DVT, cellulitis, OA  Geriatric patient with the ED evaluation of right joint pain and swelling.  She denies any injury or trauma, or fall.  She presents with some pain to the ankle and some concern over possible DVT.  Ultrasound of the right lower extremity is negative and reassuring at this time.  Plain films also did not reveal any acute injury or particular processes.  Overlying skin is warm and dry without any signs of erythema or nonintact skin.  Patient will be discharged with a prescription for Ultram to take as directed.  She is encouraged to follow-up with primary provider or Ortho for ongoing symptoms.  Return precautions have been discussed.  Coralyn Tysheka Fanguy was evaluated in Emergency Department on 08/25/2020 for the symptoms described in the history of present illness. She was evaluated in the context of the global COVID-19 pandemic, which necessitated consideration that the patient might be at risk for infection with the SARS-CoV-2 virus that causes COVID-19. Institutional protocols and algorithms that pertain to the evaluation of patients at risk for COVID-19 are in a  state of rapid change based on information released by regulatory bodies including the CDC and federal and state organizations. These policies and algorithms were followed during the patient's care in the ED. ____________________________________________  FINAL CLINICAL IMPRESSION(S) / ED DIAGNOSES  Final diagnoses:  Acute right ankle pain      Jigar Zielke, Dannielle Karvonen, PA-C 08/25/20 1518    Duffy Bruce, MD 08/25/20 1918

## 2020-08-25 NOTE — ED Notes (Signed)
Patient presents with right ankle swelling started 5 days ago and has progressively gotten worse.  Took ibuprofen at home with no improvement. Unable to bear weight on right foot without pain. No numbness or loss of sensation in this right foot

## 2020-08-25 NOTE — ED Triage Notes (Signed)
Pt comes with c/o possible DVT in right leg. Pt states she noticed the pain and swelling yesterday. Pt denies any blood thinners.   Pt states hx of clot

## 2020-08-26 ENCOUNTER — Other Ambulatory Visit: Payer: Self-pay

## 2020-08-26 DIAGNOSIS — R42 Dizziness and giddiness: Secondary | ICD-10-CM | POA: Diagnosis not present

## 2020-08-27 LAB — URINE CULTURE
MICRO NUMBER:: 12006273
SPECIMEN QUALITY:: ADEQUATE

## 2020-08-27 NOTE — Progress Notes (Signed)
Urine culture is less than 10,000 CFU/ml of gram positive organism. This means that she does not need to take her Cipro. Discontinue Cipro since this urine culture is negative for UTI.

## 2020-09-04 ENCOUNTER — Telehealth: Payer: Self-pay | Admitting: *Deleted

## 2020-09-04 ENCOUNTER — Ambulatory Visit: Payer: Medicare Other | Admitting: Orthopedic Surgery

## 2020-09-04 DIAGNOSIS — R2681 Unsteadiness on feet: Secondary | ICD-10-CM | POA: Diagnosis not present

## 2020-09-04 DIAGNOSIS — R600 Localized edema: Secondary | ICD-10-CM | POA: Diagnosis not present

## 2020-09-04 DIAGNOSIS — N3000 Acute cystitis without hematuria: Secondary | ICD-10-CM | POA: Diagnosis not present

## 2020-09-04 DIAGNOSIS — M150 Primary generalized (osteo)arthritis: Secondary | ICD-10-CM | POA: Diagnosis not present

## 2020-09-04 DIAGNOSIS — Z7983 Long term (current) use of bisphosphonates: Secondary | ICD-10-CM | POA: Diagnosis not present

## 2020-09-04 DIAGNOSIS — K766 Portal hypertension: Secondary | ICD-10-CM | POA: Diagnosis not present

## 2020-09-04 DIAGNOSIS — K7469 Other cirrhosis of liver: Secondary | ICD-10-CM | POA: Diagnosis not present

## 2020-09-04 DIAGNOSIS — M6281 Muscle weakness (generalized): Secondary | ICD-10-CM | POA: Diagnosis not present

## 2020-09-04 DIAGNOSIS — H814 Vertigo of central origin: Secondary | ICD-10-CM | POA: Diagnosis not present

## 2020-09-04 DIAGNOSIS — M810 Age-related osteoporosis without current pathological fracture: Secondary | ICD-10-CM | POA: Diagnosis not present

## 2020-09-04 NOTE — Telephone Encounter (Signed)
Stacy with [facility] called requesting verbal orders for PT 1x1,2x3,1x1.  Verbal orders given.

## 2020-09-05 ENCOUNTER — Other Ambulatory Visit: Payer: Self-pay | Admitting: Orthopedic Surgery

## 2020-09-05 DIAGNOSIS — I10 Essential (primary) hypertension: Secondary | ICD-10-CM

## 2020-09-09 DIAGNOSIS — M150 Primary generalized (osteo)arthritis: Secondary | ICD-10-CM | POA: Diagnosis not present

## 2020-09-09 DIAGNOSIS — M6281 Muscle weakness (generalized): Secondary | ICD-10-CM | POA: Diagnosis not present

## 2020-09-09 DIAGNOSIS — K7469 Other cirrhosis of liver: Secondary | ICD-10-CM | POA: Diagnosis not present

## 2020-09-09 DIAGNOSIS — H814 Vertigo of central origin: Secondary | ICD-10-CM | POA: Diagnosis not present

## 2020-09-09 DIAGNOSIS — R2681 Unsteadiness on feet: Secondary | ICD-10-CM | POA: Diagnosis not present

## 2020-09-09 DIAGNOSIS — K766 Portal hypertension: Secondary | ICD-10-CM | POA: Diagnosis not present

## 2020-09-09 DIAGNOSIS — M810 Age-related osteoporosis without current pathological fracture: Secondary | ICD-10-CM | POA: Diagnosis not present

## 2020-09-09 DIAGNOSIS — R600 Localized edema: Secondary | ICD-10-CM | POA: Diagnosis not present

## 2020-09-09 DIAGNOSIS — Z7983 Long term (current) use of bisphosphonates: Secondary | ICD-10-CM | POA: Diagnosis not present

## 2020-09-09 DIAGNOSIS — N3000 Acute cystitis without hematuria: Secondary | ICD-10-CM | POA: Diagnosis not present

## 2020-09-11 DIAGNOSIS — M810 Age-related osteoporosis without current pathological fracture: Secondary | ICD-10-CM | POA: Diagnosis not present

## 2020-09-11 DIAGNOSIS — Z7983 Long term (current) use of bisphosphonates: Secondary | ICD-10-CM | POA: Diagnosis not present

## 2020-09-11 DIAGNOSIS — M150 Primary generalized (osteo)arthritis: Secondary | ICD-10-CM | POA: Diagnosis not present

## 2020-09-11 DIAGNOSIS — H814 Vertigo of central origin: Secondary | ICD-10-CM | POA: Diagnosis not present

## 2020-09-11 DIAGNOSIS — R600 Localized edema: Secondary | ICD-10-CM | POA: Diagnosis not present

## 2020-09-11 DIAGNOSIS — N3000 Acute cystitis without hematuria: Secondary | ICD-10-CM | POA: Diagnosis not present

## 2020-09-11 DIAGNOSIS — R2681 Unsteadiness on feet: Secondary | ICD-10-CM | POA: Diagnosis not present

## 2020-09-11 DIAGNOSIS — M6281 Muscle weakness (generalized): Secondary | ICD-10-CM | POA: Diagnosis not present

## 2020-09-11 DIAGNOSIS — K766 Portal hypertension: Secondary | ICD-10-CM | POA: Diagnosis not present

## 2020-09-11 DIAGNOSIS — K7469 Other cirrhosis of liver: Secondary | ICD-10-CM | POA: Diagnosis not present

## 2020-09-16 DIAGNOSIS — H814 Vertigo of central origin: Secondary | ICD-10-CM | POA: Diagnosis not present

## 2020-09-16 DIAGNOSIS — K7469 Other cirrhosis of liver: Secondary | ICD-10-CM | POA: Diagnosis not present

## 2020-09-16 DIAGNOSIS — N3000 Acute cystitis without hematuria: Secondary | ICD-10-CM | POA: Diagnosis not present

## 2020-09-16 DIAGNOSIS — R600 Localized edema: Secondary | ICD-10-CM | POA: Diagnosis not present

## 2020-09-16 DIAGNOSIS — M150 Primary generalized (osteo)arthritis: Secondary | ICD-10-CM | POA: Diagnosis not present

## 2020-09-16 DIAGNOSIS — Z7983 Long term (current) use of bisphosphonates: Secondary | ICD-10-CM | POA: Diagnosis not present

## 2020-09-16 DIAGNOSIS — M810 Age-related osteoporosis without current pathological fracture: Secondary | ICD-10-CM | POA: Diagnosis not present

## 2020-09-16 DIAGNOSIS — R2681 Unsteadiness on feet: Secondary | ICD-10-CM | POA: Diagnosis not present

## 2020-09-16 DIAGNOSIS — K766 Portal hypertension: Secondary | ICD-10-CM | POA: Diagnosis not present

## 2020-09-16 DIAGNOSIS — M6281 Muscle weakness (generalized): Secondary | ICD-10-CM | POA: Diagnosis not present

## 2020-09-18 DIAGNOSIS — K766 Portal hypertension: Secondary | ICD-10-CM | POA: Diagnosis not present

## 2020-09-18 DIAGNOSIS — N3000 Acute cystitis without hematuria: Secondary | ICD-10-CM | POA: Diagnosis not present

## 2020-09-18 DIAGNOSIS — R2681 Unsteadiness on feet: Secondary | ICD-10-CM | POA: Diagnosis not present

## 2020-09-18 DIAGNOSIS — M6281 Muscle weakness (generalized): Secondary | ICD-10-CM | POA: Diagnosis not present

## 2020-09-18 DIAGNOSIS — K7469 Other cirrhosis of liver: Secondary | ICD-10-CM | POA: Diagnosis not present

## 2020-09-18 DIAGNOSIS — M150 Primary generalized (osteo)arthritis: Secondary | ICD-10-CM | POA: Diagnosis not present

## 2020-09-18 DIAGNOSIS — H814 Vertigo of central origin: Secondary | ICD-10-CM | POA: Diagnosis not present

## 2020-09-18 DIAGNOSIS — R600 Localized edema: Secondary | ICD-10-CM | POA: Diagnosis not present

## 2020-09-18 DIAGNOSIS — M810 Age-related osteoporosis without current pathological fracture: Secondary | ICD-10-CM | POA: Diagnosis not present

## 2020-09-18 DIAGNOSIS — Z7983 Long term (current) use of bisphosphonates: Secondary | ICD-10-CM | POA: Diagnosis not present

## 2020-09-22 ENCOUNTER — Other Ambulatory Visit: Payer: Medicare Other

## 2020-09-24 ENCOUNTER — Telehealth: Payer: Self-pay | Admitting: Orthopedic Surgery

## 2020-09-24 ENCOUNTER — Telehealth: Payer: Self-pay | Admitting: Nurse Practitioner

## 2020-09-24 DIAGNOSIS — R600 Localized edema: Secondary | ICD-10-CM | POA: Diagnosis not present

## 2020-09-24 DIAGNOSIS — R2681 Unsteadiness on feet: Secondary | ICD-10-CM | POA: Diagnosis not present

## 2020-09-24 DIAGNOSIS — H814 Vertigo of central origin: Secondary | ICD-10-CM | POA: Diagnosis not present

## 2020-09-24 DIAGNOSIS — K7469 Other cirrhosis of liver: Secondary | ICD-10-CM | POA: Diagnosis not present

## 2020-09-24 DIAGNOSIS — M6281 Muscle weakness (generalized): Secondary | ICD-10-CM | POA: Diagnosis not present

## 2020-09-24 DIAGNOSIS — Z7983 Long term (current) use of bisphosphonates: Secondary | ICD-10-CM | POA: Diagnosis not present

## 2020-09-24 DIAGNOSIS — N3000 Acute cystitis without hematuria: Secondary | ICD-10-CM | POA: Diagnosis not present

## 2020-09-24 DIAGNOSIS — K766 Portal hypertension: Secondary | ICD-10-CM | POA: Diagnosis not present

## 2020-09-24 DIAGNOSIS — M810 Age-related osteoporosis without current pathological fracture: Secondary | ICD-10-CM | POA: Diagnosis not present

## 2020-09-24 DIAGNOSIS — M150 Primary generalized (osteo)arthritis: Secondary | ICD-10-CM | POA: Diagnosis not present

## 2020-09-24 NOTE — Telephone Encounter (Signed)
Called to schedule AWV. Left message to call in to get scheduled.

## 2020-09-24 NOTE — Telephone Encounter (Signed)
Error. Please disregard

## 2020-09-25 ENCOUNTER — Other Ambulatory Visit: Payer: Self-pay | Admitting: Orthopedic Surgery

## 2020-09-25 DIAGNOSIS — M150 Primary generalized (osteo)arthritis: Secondary | ICD-10-CM | POA: Diagnosis not present

## 2020-09-25 DIAGNOSIS — K7469 Other cirrhosis of liver: Secondary | ICD-10-CM | POA: Diagnosis not present

## 2020-09-25 DIAGNOSIS — Z7983 Long term (current) use of bisphosphonates: Secondary | ICD-10-CM | POA: Diagnosis not present

## 2020-09-25 DIAGNOSIS — K766 Portal hypertension: Secondary | ICD-10-CM | POA: Diagnosis not present

## 2020-09-25 DIAGNOSIS — M810 Age-related osteoporosis without current pathological fracture: Secondary | ICD-10-CM | POA: Diagnosis not present

## 2020-09-25 DIAGNOSIS — N3000 Acute cystitis without hematuria: Secondary | ICD-10-CM | POA: Diagnosis not present

## 2020-09-25 DIAGNOSIS — R2681 Unsteadiness on feet: Secondary | ICD-10-CM | POA: Diagnosis not present

## 2020-09-25 DIAGNOSIS — M6281 Muscle weakness (generalized): Secondary | ICD-10-CM | POA: Diagnosis not present

## 2020-09-25 DIAGNOSIS — H814 Vertigo of central origin: Secondary | ICD-10-CM | POA: Diagnosis not present

## 2020-09-25 DIAGNOSIS — R600 Localized edema: Secondary | ICD-10-CM | POA: Diagnosis not present

## 2020-10-08 ENCOUNTER — Other Ambulatory Visit: Payer: Self-pay

## 2020-10-08 ENCOUNTER — Other Ambulatory Visit: Payer: Medicare Other

## 2020-10-08 DIAGNOSIS — N3000 Acute cystitis without hematuria: Secondary | ICD-10-CM | POA: Diagnosis not present

## 2020-10-08 DIAGNOSIS — M810 Age-related osteoporosis without current pathological fracture: Secondary | ICD-10-CM | POA: Diagnosis not present

## 2020-10-08 DIAGNOSIS — R2681 Unsteadiness on feet: Secondary | ICD-10-CM | POA: Diagnosis not present

## 2020-10-08 DIAGNOSIS — I10 Essential (primary) hypertension: Secondary | ICD-10-CM | POA: Diagnosis not present

## 2020-10-08 DIAGNOSIS — Z7983 Long term (current) use of bisphosphonates: Secondary | ICD-10-CM | POA: Diagnosis not present

## 2020-10-08 DIAGNOSIS — K766 Portal hypertension: Secondary | ICD-10-CM | POA: Diagnosis not present

## 2020-10-08 DIAGNOSIS — H814 Vertigo of central origin: Secondary | ICD-10-CM | POA: Diagnosis not present

## 2020-10-08 DIAGNOSIS — M150 Primary generalized (osteo)arthritis: Secondary | ICD-10-CM | POA: Diagnosis not present

## 2020-10-08 DIAGNOSIS — M6281 Muscle weakness (generalized): Secondary | ICD-10-CM | POA: Diagnosis not present

## 2020-10-08 DIAGNOSIS — R600 Localized edema: Secondary | ICD-10-CM | POA: Diagnosis not present

## 2020-10-08 DIAGNOSIS — K7469 Other cirrhosis of liver: Secondary | ICD-10-CM | POA: Diagnosis not present

## 2020-10-08 LAB — BASIC METABOLIC PANEL
BUN/Creatinine Ratio: 17 (calc) (ref 6–22)
BUN: 9 mg/dL (ref 7–25)
CO2: 28 mmol/L (ref 20–32)
Calcium: 7.9 mg/dL — ABNORMAL LOW (ref 8.6–10.4)
Chloride: 104 mmol/L (ref 98–110)
Creat: 0.52 mg/dL — ABNORMAL LOW (ref 0.60–0.95)
Glucose, Bld: 105 mg/dL (ref 65–139)
Potassium: 4 mmol/L (ref 3.5–5.3)
Sodium: 137 mmol/L (ref 135–146)

## 2020-10-08 LAB — CBC WITH DIFFERENTIAL/PLATELET
Absolute Monocytes: 456 cells/uL (ref 200–950)
Basophils Absolute: 20 cells/uL (ref 0–200)
Basophils Relative: 0.5 %
Eosinophils Absolute: 80 cells/uL (ref 15–500)
Eosinophils Relative: 2 %
HCT: 34.5 % — ABNORMAL LOW (ref 35.0–45.0)
Hemoglobin: 11.7 g/dL (ref 11.7–15.5)
Lymphs Abs: 844 cells/uL — ABNORMAL LOW (ref 850–3900)
MCH: 30.9 pg (ref 27.0–33.0)
MCHC: 33.9 g/dL (ref 32.0–36.0)
MCV: 91 fL (ref 80.0–100.0)
MPV: 10.8 fL (ref 7.5–12.5)
Monocytes Relative: 11.4 %
Neutro Abs: 2600 cells/uL (ref 1500–7800)
Neutrophils Relative %: 65 %
Platelets: 148 10*3/uL (ref 140–400)
RBC: 3.79 10*6/uL — ABNORMAL LOW (ref 3.80–5.10)
RDW: 13.1 % (ref 11.0–15.0)
Total Lymphocyte: 21.1 %
WBC: 4 10*3/uL (ref 3.8–10.8)

## 2020-10-09 ENCOUNTER — Other Ambulatory Visit: Payer: Self-pay

## 2020-10-09 NOTE — Telephone Encounter (Signed)
Opened in error

## 2020-10-09 NOTE — Progress Notes (Signed)
Ok to increase calcium supplement. It may take sometime to increase. We will plan to recheck electrolytes next visit.

## 2020-10-10 DIAGNOSIS — H814 Vertigo of central origin: Secondary | ICD-10-CM | POA: Diagnosis not present

## 2020-10-10 DIAGNOSIS — Z7983 Long term (current) use of bisphosphonates: Secondary | ICD-10-CM | POA: Diagnosis not present

## 2020-10-10 DIAGNOSIS — R2681 Unsteadiness on feet: Secondary | ICD-10-CM | POA: Diagnosis not present

## 2020-10-10 DIAGNOSIS — M810 Age-related osteoporosis without current pathological fracture: Secondary | ICD-10-CM | POA: Diagnosis not present

## 2020-10-10 DIAGNOSIS — K7469 Other cirrhosis of liver: Secondary | ICD-10-CM | POA: Diagnosis not present

## 2020-10-10 DIAGNOSIS — M150 Primary generalized (osteo)arthritis: Secondary | ICD-10-CM | POA: Diagnosis not present

## 2020-10-10 DIAGNOSIS — R600 Localized edema: Secondary | ICD-10-CM | POA: Diagnosis not present

## 2020-10-10 DIAGNOSIS — M6281 Muscle weakness (generalized): Secondary | ICD-10-CM | POA: Diagnosis not present

## 2020-10-10 DIAGNOSIS — N3000 Acute cystitis without hematuria: Secondary | ICD-10-CM | POA: Diagnosis not present

## 2020-10-10 DIAGNOSIS — K766 Portal hypertension: Secondary | ICD-10-CM | POA: Diagnosis not present

## 2020-10-20 DIAGNOSIS — R3915 Urgency of urination: Secondary | ICD-10-CM | POA: Diagnosis not present

## 2020-10-23 DIAGNOSIS — H401132 Primary open-angle glaucoma, bilateral, moderate stage: Secondary | ICD-10-CM | POA: Diagnosis not present

## 2020-11-10 ENCOUNTER — Ambulatory Visit (INDEPENDENT_AMBULATORY_CARE_PROVIDER_SITE_OTHER): Payer: Medicare Other | Admitting: Adult Health

## 2020-11-10 ENCOUNTER — Encounter: Payer: Self-pay | Admitting: Adult Health

## 2020-11-10 ENCOUNTER — Other Ambulatory Visit: Payer: Self-pay

## 2020-11-10 VITALS — HR 82 | Temp 97.7°F | Resp 20

## 2020-11-10 DIAGNOSIS — J440 Chronic obstructive pulmonary disease with acute lower respiratory infection: Secondary | ICD-10-CM

## 2020-11-10 DIAGNOSIS — J988 Other specified respiratory disorders: Secondary | ICD-10-CM | POA: Diagnosis not present

## 2020-11-10 DIAGNOSIS — J209 Acute bronchitis, unspecified: Secondary | ICD-10-CM | POA: Diagnosis not present

## 2020-11-10 LAB — POCT INFLUENZA A/B
Influenza A, POC: NEGATIVE
Influenza B, POC: NEGATIVE

## 2020-11-10 MED ORDER — SACCHAROMYCES BOULARDII 250 MG PO CAPS: 250.0000 mg | ORAL_CAPSULE | Freq: Two times a day (BID) | ORAL | 0 refills | Status: AC

## 2020-11-10 MED ORDER — BENZONATATE 100 MG PO CAPS: 100.0000 mg | ORAL_CAPSULE | Freq: Three times a day (TID) | ORAL | 0 refills | Status: AC | PRN

## 2020-11-10 MED ORDER — DOXYCYCLINE MONOHYDRATE 100 MG PO CAPS: 100.0000 mg | ORAL_CAPSULE | Freq: Two times a day (BID) | ORAL | 0 refills | Status: AC

## 2020-11-10 MED ORDER — ZINC SULFATE 220 (50 ZN) MG PO TABS: 1.0000 | ORAL_TABLET | Freq: Every day | ORAL | 0 refills | Status: AC

## 2020-11-10 NOTE — Patient Instructions (Signed)
Acute Bronchitis, Adult  Acute bronchitis is when air tubes in the lungs (bronchi) suddenly get swollen. The condition can make it hard for you to breathe. In adults, acute bronchitis usually goes away within 2 weeks. A cough caused by bronchitis may last up to 3 weeks. Smoking, allergies, and asthma can make thecondition worse. What are the causes? This condition is caused by: Cold and flu viruses. The most common cause of this condition is the virus that causes the common cold. Bacteria. Substances that irritate the lungs, including: Smoke from cigarettes and other types of tobacco. Dust and pollen. Fumes from chemicals, gases, or burned fuel. Other materials that pollute indoor or outdoor air. Close contact with someone who has acute bronchitis. What increases the risk? The following factors may make you more likely to develop this condition: A weak body's defense system. This is also called the immune system. Any condition that affects your lungs and breathing, such as asthma. What are the signs or symptoms? Symptoms of this condition include: A cough. Coughing up clear, yellow, or green mucus. Wheezing. Having too much mucus in your lungs (chest congestion). Shortness of breath. A fever. Chills. Body aches. A sore throat. How is this treated? Acute bronchitis may go away over time without treatment. Your doctor may recommend: Drinking more fluids. Using a device that gets medicine into your lungs (inhaler). Using a vaporizer or a humidifier. These are machines that add water or moisture to the air. This helps with coughing and poor breathing. Taking a medicine for fever. Taking a medicine that thins mucus and clears congestion. Taking a medicine that prevents or stops coughing. Follow these instructions at home: Activity Get a lot of rest. Return to your normal activities as told by your doctor. Ask your doctor what activities are safe for you. Lifestyle  Drink enough  fluid to keep your pee (urine) pale yellow. Do not drink alcohol. Do not use any products that contain nicotine or tobacco, such as cigarettes, e-cigarettes, and chewing tobacco. If you need help quitting, ask your doctor. Be aware that: Your bronchitis will get worse if you smoke or breathe in other people's smoke (secondhand smoke). Your lungs will heal faster if you quit smoking.  General instructions Take over-the-counter and prescription medicines only as told by your doctor. Use an inhaler, cool mist vaporizer, or humidifier as told by your doctor. Rinse your mouth often with salt water. To make salt water, dissolve -1 tsp (3-6 g) of salt in 1 cup (237 mL) of warm water. Take two teaspoons of honey at bedtime. This helps lessen your coughing at night. Keep all follow-up visits as told by your doctor. This is important. How is this prevented? To lower your risk of getting this condition again: Wash your hands often with soap and water. If you cannot use soap and water, use hand sanitizer. Avoid contact with people who have cold symptoms. Try not to touch your mouth, nose, or eyes with your hands. Make sure to get the flu shot every year. Contact a doctor if: Your symptoms do not get better in 2 weeks. You vomit more than once or twice. You have symptoms of loss of fluid from your body (dehydration). These include: Dark pee. Dry skin or eyes. Increased thirst. Headaches. Confusion. Muscle cramps. Get help right away if: You cough up blood. You have chest pain. You have very bad shortness of breath. You become dehydrated. You faint or keep feeling like you are going to faint. You   have a very bad headache. Your fever or chills get worse. These symptoms may be an emergency. Get help right away. Call your local emergency services (911 in the [place] Do not wait to see if the symptoms will go away. Do not drive yourself to the hospital. Summary Acute bronchitis is when air  tubes in the lungs (bronchi) suddenly get swollen. In adults, acute bronchitis usually goes away within 2 weeks. Take over-the-counter and prescription medicines only as told by your doctor. Drink enough fluid to keep your pee (urine) pale yellow. Contact a doctor if your symptoms do not improve after 2 weeks of treatment. Get help right away if you cough up blood, faint, or have chest pain or shortness of breath. This information is not intended to replace advice given to you by your health care provider. Make sure you discuss any questions you have with your healthcare provider. Document Revised: 01/30/2020 Document Reviewed: 09/22/2018 Elsevier Patient Education  2022 [employer] Inc.  

## 2020-11-10 NOTE — Progress Notes (Signed)
[facility] clinic   Provider:  Durenda Age  DNP  Code Status:  Full Code  Goals of Care:  Advanced Directives 08/25/2020  Does Patient Have a Medical Advance Directive? Yes  Type of Paramedic of [place];Living will  Does patient want to make changes to medical advance directive? -  Copy of [place] in Chart? -  Would patient like information on creating a medical advance directive? -  Pre-existing out of facility DNR order (yellow form or pink MOST form) -     Chief Complaint  Patient presents with   Acute Visit    Patient presents today for URI symptoms. She reports cough, runny nose, congestion. Daughter had a URI and they thinking patient may have the same thing.    HPI: Patient is a 87 y.o. female seen today for an acute visit for productive cough with greenish/yellowish, chest congestion, sore throat, runny nose. Symptoms started 5 days ago. She is able to taste her food and able to smell. Influeza A/B test was negative. She was also tested for COVID-19. She denies shortness of breath. Nasal mucus is yellowish. Daughter had respiratory infection and was treated with antibiotics and steroids.Daughter accompanied her to the clinic today.   Past Medical History:  Diagnosis Date   Arthritis    Choledocholithiasis    Cirrhosis, cryptogenic (HCC)    FOLLOWED BY DR PYRTLE-- LOV  IN EPIC--  STABLE PER NOTE   Common bile duct stone    Cryptogenic cirrhosis (HCC)    DDD (degenerative disc disease), lumbar    Diverticulosis    Esophageal varix (HCC)    GERD (gastroesophageal reflux disease)    Hiatal hernia    History of colonoscopy with polypectomy    ADENOMATOUS AND HYPERPLASIA POLYPS  (2008  &  2010)   History of hepatitis C    History of positive PPD    + TB SKIN TEST WITHOUT TB   Hyperlipidemia    Hypertension, portal (HCC)    SECONDARY TO CRYPTOGENIC CIRRHOSIS   Internal hemorrhoids    Iron deficiency anemia    Loss  of hearing    right ear   Osteoarthritis    hands, knees, and low back   Osteoporosis    Portal hypertension (HCC)    PUD (peptic ulcer disease)    Squamous cell carcinoma of skin 04/07/2017   left cheek   Tubular adenoma of colon    Uterovaginal prolapse, incomplete    Wears glasses    Wears hearing aid    left ear only    Past Surgical History:  Procedure Laterality Date   APPENDECTOMY  1960'S   CHOLECYSTECTOMY  08/25/2011   Procedure: LAPAROSCOPIC CHOLECYSTECTOMY WITH INTRAOPERATIVE CHOLANGIOGRAM;  Surgeon: Zenovia Jarred, MD;  Location: [place];  Service: General;  Laterality: N/A;   ENDOSCOPIC RETROGRADE CHOLANGIOPANCREATOGRAPHY (ERCP) WITH PROPOFOL N/A 11/30/2012   Procedure: ENDOSCOPIC RETROGRADE CHOLANGIOPANCREATOGRAPHY (ERCP) WITH PROPOFOL;  Surgeon: Milus Banister, MD;  Location: [facility];  Service: Endoscopy;  Laterality: N/A;   ESOPHAGOGASTRODUODENOSCOPY N/A 05/18/2018   Procedure: ESOPHAGOGASTRODUODENOSCOPY (EGD);  Surgeon: Lin Landsman, MD;  Location: [facility] ENDOSCOPY;  Service: Gastroenterology;  Laterality: N/A;   EXTERNAL EAR SURGERY Left 1980   HYSTEROSCOPY WITH D & C N/A 07/15/2014   Procedure: DILATATION AND CURETTAGE /HYSTEROSCOPY;  Surgeon: Molli Posey, MD;  Location: [place];  Service: Gynecology;  Laterality: N/A;   INTRAMEDULLARY (IM) NAIL INTERTROCHANTERIC Left 01/22/2018   Procedure: ORIF LEFT  INTERTROCHANTRIC HIP FX;  Surgeon: Mcarthur Rossetti, MD;  Location: [place];  Service: Orthopedics;  Laterality: Left;   KNEE ARTHROSCOPY W/ MENISCECTOMY Right 06-19-2002   LUMBAR DISC SURGERY  1990   REMOVAL OF URINARY SLING N/A 09/13/2013   Procedure: REMOVAL OF RETAINED PESSARY;  Surgeon: Jorja Loa, MD;  Location: [facility];  Service: Urology;  Laterality: N/A;    No Known Allergies  Outpatient Encounter Medications as of 11/10/2020  Medication Sig   benzonatate (TESSALON PERLES) 100 MG capsule Take 1  capsule (100 mg total) by mouth 3 (three) times daily as needed for up to 14 days for cough.   Calcium-Phosphorus-Vitamin D (CALCIUM GUMMIES PO) Take 2 tablets by mouth daily.   cetirizine (ZYRTEC) 10 MG tablet Take 10 mg by mouth as needed for allergies.   Cholecalciferol (VITAMIN D3) 50 MCG (2000 UT) CAPS Take 1 capsule by mouth daily in the afternoon.   denosumab (PROLIA) 60 MG/ML SOSY injection Inject 60 mg into the skin every 6 (six) months.   dextromethorphan-guaiFENesin (MUCINEX DM) 30-600 MG 12hr tablet Take 1 tablet by mouth 2 (two) times daily.   dimenhyDRINATE (DRAMAMINE) 50 MG tablet Take 50 mg by mouth as needed.   doxycycline (MONODOX) 100 MG capsule Take 1 capsule (100 mg total) by mouth 2 (two) times daily for 7 days.   ibuprofen (ADVIL) 200 MG tablet Take 200 mg by mouth as needed.   Iron Combinations (IRON COMPLEX PO) Take 1 tablet by mouth daily.   memantine (NAMENDA) 10 MG tablet TAKE 1 TABLET BY MOUTH TWICE A DAY   ondansetron (ZOFRAN) 4 MG tablet Take 4 mg by mouth every 8 (eight) hours as needed for nausea or vomiting.   pantoprazole (PROTONIX) 40 MG tablet TAKE 1 TABLET BY MOUTH DAILY BEFORE BREAKFAST   saccharomyces boulardii (FLORASTOR) 250 MG capsule Take 1 capsule (250 mg total) by mouth 2 (two) times daily for 10 days.   sennosides-docusate sodium (SENOKOT-S) 8.6-50 MG tablet Take 1 tablet by mouth daily as needed for constipation.   timolol (TIMOPTIC) 0.5 % ophthalmic solution Place 1 drop into both eyes 2 (two) times daily.    traMADol (ULTRAM) 50 MG tablet TAKE ONE-HALF TO ONE TABLET BY MOUTH UP TO EVERY 12 HOURS AS NEEDED FOR MODERATE PAIN.   traZODone (DESYREL) 150 MG tablet TAKE 1 TABLET BY MOUTH EVERYDAY AT BEDTIME   vitamin E 400 UNIT capsule Take 400 Units by mouth daily.   Zinc Sulfate 220 (50 Zn) MG TABS Take 1 tablet (220 mg total) by mouth daily for 7 days.   No facility-administered encounter medications on file as of 11/10/2020.    Review of Systems:   Review of Systems  Constitutional:  Negative for appetite change, chills and fever.  HENT:  Positive for congestion.   Eyes: Negative.   Respiratory:  Positive for cough. Negative for shortness of breath and wheezing.   Cardiovascular:  Negative for leg swelling.  Endocrine: Negative.   Genitourinary: Negative.   Skin: Negative.   Neurological:  Negative for tremors.  Psychiatric/Behavioral: Negative.     Health Maintenance  Topic Date Due   Zoster Vaccines- Shingrix (1 of 2) Never done   COVID-19 Vaccine (5 - Booster for Pfizer series) 10/19/2020   INFLUENZA VACCINE  10/13/2020   MAMMOGRAM  05/06/2021   TETANUS/TDAP  02/26/2027   DEXA SCAN  Completed   PNA vac Low Risk Adult  Completed   HPV VACCINES  Aged Out  Physical Exam: Vitals:   11/10/20 1408  Pulse: 82  Resp: 20  Temp: 97.7 F (36.5 C)  SpO2: 98%   There is no height or weight on file to calculate BMI. Physical Exam HENT:     Head: Normocephalic and atraumatic.  Eyes:     Conjunctiva/sclera: Conjunctivae normal.  Cardiovascular:     Rate and Rhythm: Normal rate and regular rhythm.  Pulmonary:     Effort: Pulmonary effort is normal.  Musculoskeletal:        General: Normal range of motion.     Cervical back: Normal range of motion and neck supple.     Right lower leg: No edema.     Left lower leg: No edema.  Skin:    General: Skin is warm and dry.  Neurological:     General: No focal deficit present.     Mental Status: Mental status is at baseline.  Psychiatric:        Mood and Affect: Mood normal.        Behavior: Behavior normal.    Labs reviewed: Basic Metabolic Panel: Recent Labs    05/23/20 1004 08/21/20 1520 10/08/20 1106  NA 142 142 137  K 3.8 3.5 4.0  CL 105 107 104  CO2 32 28 28  GLUCOSE 83 79 105  BUN '12 8 9  '$ CREATININE 0.60 0.63 0.52*  CALCIUM 9.3 7.8* 7.9*  TSH 2.54  --   --    Liver Function Tests: Recent Labs    12/21/19 1230 01/09/20 0754 05/23/20 1004  AST 33  40* 28  ALT '19 24 15  '$ ALKPHOS 72 79  --   BILITOT 0.8 0.9 0.8  PROT 5.9* 5.5* 5.3*  ALBUMIN 3.6 3.7  --    CBC: Recent Labs    05/23/20 1004 08/21/20 1520 10/08/20 1106  WBC 4.7 4.9 4.0  NEUTROABS 3,027 2,822 2,600  HGB 12.8 11.6* 11.7  HCT 38.7 34.9* 34.5*  MCV 93.9 92.6 91.0  PLT 167 165 148   Lipid Panel: No results for input(s): CHOL, HDL, LDLCALC, TRIG, CHOLHDL, LDLDIRECT in the last 8760 hours. Lab Results  Component Value Date   HGBA1C 4.9 01/18/2014    Procedures since last visit: No results found.  Assessment/Plan  1. Acute bronchitis with COPD ([place])  - SARS-COV-2 RNA,(COVID-19) QUAL NAAT - POCT Influenza A/B was negative - doxycycline (MONODOX) 100 MG capsule; Take 1 capsule (100 mg total) by mouth 2 (two) times daily for 7 days.  Dispense: 14 capsule; Refill: 0 - benzonatate (TESSALON PERLES) 100 MG capsule; Take 1 capsule (100 mg total) by mouth 3 (three) times daily as needed for up to 14 days for cough.  Dispense: 20 capsule; Refill: 0 - saccharomyces boulardii (FLORASTOR) 250 MG capsule; Take 1 capsule (250 mg total) by mouth 2 (two) times daily for 10 days.  Dispense: 20 capsule; Refill: 0 - Zinc Sulfate 220 (50 Zn) MG TABS; Take 1 tablet (220 mg total) by mouth daily for 7 days.  Dispense: 7 tablet; Refill: 0    Labs/tests ordered:  - SARS-COV-2 RNA,(COVID-19) QUAL NAAT                                     - POCT Influenza A/B   Next appt:  02/12/2021

## 2020-11-11 LAB — SARS-COV-2 RNA,(COVID-19) QUALITATIVE NAAT: SARS CoV2 RNA: NOT DETECTED

## 2020-11-11 NOTE — Progress Notes (Signed)
COVID-19 test is negative. Hopefully she is doing better with antibiotics.

## 2020-12-07 ENCOUNTER — Encounter: Payer: Self-pay | Admitting: Orthopedic Surgery

## 2020-12-08 ENCOUNTER — Ambulatory Visit (INDEPENDENT_AMBULATORY_CARE_PROVIDER_SITE_OTHER): Payer: Medicare Other | Admitting: Orthopedic Surgery

## 2020-12-08 ENCOUNTER — Other Ambulatory Visit: Payer: Self-pay | Admitting: Orthopedic Surgery

## 2020-12-08 ENCOUNTER — Encounter: Payer: Self-pay | Admitting: Orthopedic Surgery

## 2020-12-08 ENCOUNTER — Other Ambulatory Visit: Payer: Self-pay

## 2020-12-08 VITALS — BP 140/70 | HR 39 | Temp 97.8°F | Ht 59.0 in | Wt 107.0 lb

## 2020-12-08 DIAGNOSIS — M159 Polyosteoarthritis, unspecified: Secondary | ICD-10-CM

## 2020-12-08 DIAGNOSIS — R42 Dizziness and giddiness: Secondary | ICD-10-CM

## 2020-12-08 DIAGNOSIS — M8949 Other hypertrophic osteoarthropathy, multiple sites: Secondary | ICD-10-CM

## 2020-12-08 DIAGNOSIS — R001 Bradycardia, unspecified: Secondary | ICD-10-CM

## 2020-12-08 DIAGNOSIS — G4700 Insomnia, unspecified: Secondary | ICD-10-CM

## 2020-12-08 MED ORDER — TRAMADOL HCL 50 MG PO TABS: 50.0000 mg | ORAL_TABLET | Freq: Two times a day (BID) | ORAL | 0 refills | Status: AC | PRN

## 2020-12-08 MED ORDER — TRAZODONE HCL 100 MG PO TABS: 100.0000 mg | ORAL_TABLET | Freq: Every day | ORAL | 1 refills | Status: DC

## 2020-12-08 NOTE — Progress Notes (Addendum)
Careteam: Patient Care Team: Brianna Alanis, NP as PCP - General (Adult Health Nurse Practitioner)  Seen by: [name], AGNP-C  PLACE OF SERVICE:  [place] Directive information    No Known Allergies  Chief Complaint  Patient presents with   Acute Visit    Patient complains of left hip, left lower back and dizziness pain has been going on since yesterday. Patient took tramadol which seems to help.Patient taking dramamine fo dizziness, but not helping   Health Maintenance    Zoster vaccine and 3rd Covid booster    HPI: Patient is a 87 y.o. female seen today for acute visit due to dizziness and left hip pain.   Daughter present during encounter.   Left hip pain began about 2 days ago. No recent falls. Ambulates with cane. Pain worse in AM and improves throughout the day.Takes tramadol 50 mg daily prn for pain. Discussed treatment options. She would like to try Tramadol 50 mg every morning to help with pain. She continues to live alone, family lives close by to help with care/chores.   In addition, she reports dizziness within the past day. Symptoms intermittent, not increased with position change. She tried taking some dramamine without success. Heart rate low today. Apical HR 42. She is not followed by cardiology. She denies chest pain, increased confusion, or syncope.   Review of Systems:  Review of Systems  Constitutional:  Negative for chills, fever, malaise/fatigue and weight loss.  HENT:  Positive for hearing loss.   Eyes:  Negative for blurred vision and double vision.  Respiratory:  Negative for cough, shortness of breath and wheezing.   Cardiovascular:  Negative for chest pain, palpitations and leg swelling.  Gastrointestinal:  Negative for abdominal pain, blood in stool, constipation, diarrhea, heartburn, nausea and vomiting.  Genitourinary:  Negative for dysuria, frequency and hematuria.  Musculoskeletal:  Positive for back pain and joint pain. Negative  for falls.  Neurological:  Positive for dizziness and weakness. Negative for headaches.  Psychiatric/Behavioral:  Positive for memory loss. Negative for depression and substance abuse. The patient has insomnia.    Past Medical History:  Diagnosis Date   Arthritis    Choledocholithiasis    Cirrhosis, cryptogenic (HCC)    FOLLOWED BY DR PYRTLE-- LOV  IN EPIC--  STABLE PER NOTE   Common bile duct stone    Cryptogenic cirrhosis (HCC)    DDD (degenerative disc disease), lumbar    Diverticulosis    Esophageal varix (HCC)    GERD (gastroesophageal reflux disease)    Hiatal hernia    History of colonoscopy with polypectomy    ADENOMATOUS AND HYPERPLASIA POLYPS  (2008  &  2010)   History of hepatitis C    History of positive PPD    + TB SKIN TEST WITHOUT TB   Hyperlipidemia    Hypertension, portal (HCC)    SECONDARY TO CRYPTOGENIC CIRRHOSIS   Internal hemorrhoids    Iron deficiency anemia    Loss of hearing    right ear   Osteoarthritis    hands, knees, and low back   Osteoporosis    Portal hypertension (HCC)    PUD (peptic ulcer disease)    Squamous cell carcinoma of skin 04/07/2017   left cheek   Tubular adenoma of colon    Uterovaginal prolapse, incomplete    Wears glasses    Wears hearing aid    left ear only   Past Surgical History:  Procedure Laterality Date  APPENDECTOMY  1960'S   CHOLECYSTECTOMY  08/25/2011   Procedure: LAPAROSCOPIC CHOLECYSTECTOMY WITH INTRAOPERATIVE CHOLANGIOGRAM;  Surgeon: Zenovia Jarred, MD;  Location: [place];  Service: General;  Laterality: N/A;   ENDOSCOPIC RETROGRADE CHOLANGIOPANCREATOGRAPHY (ERCP) WITH PROPOFOL N/A 11/30/2012   Procedure: ENDOSCOPIC RETROGRADE CHOLANGIOPANCREATOGRAPHY (ERCP) WITH PROPOFOL;  Surgeon: Milus Banister, MD;  Location: [facility];  Service: Endoscopy;  Laterality: N/A;   ESOPHAGOGASTRODUODENOSCOPY N/A 05/18/2018   Procedure: ESOPHAGOGASTRODUODENOSCOPY (EGD);  Surgeon: Lin Landsman, MD;  Location: [facility]  ENDOSCOPY;  Service: Gastroenterology;  Laterality: N/A;   EXTERNAL EAR SURGERY Left 1980   HYSTEROSCOPY WITH D & C N/A 07/15/2014   Procedure: DILATATION AND CURETTAGE /HYSTEROSCOPY;  Surgeon: Molli Posey, MD;  Location: [place];  Service: Gynecology;  Laterality: N/A;   INTRAMEDULLARY (IM) NAIL INTERTROCHANTERIC Left 01/22/2018   Procedure: ORIF LEFT INTERTROCHANTRIC HIP FX;  Surgeon: Mcarthur Rossetti, MD;  Location: [place];  Service: Orthopedics;  Laterality: Left;   KNEE ARTHROSCOPY W/ MENISCECTOMY Right 06-19-2002   LUMBAR DISC SURGERY  1990   REMOVAL OF URINARY SLING N/A 09/13/2013   Procedure: REMOVAL OF RETAINED PESSARY;  Surgeon: Jorja Loa, MD;  Location: [facility];  Service: Urology;  Laterality: N/A;   Social History:   reports that she has never smoked. She has never used smokeless tobacco. She reports that she does not drink alcohol and does not use drugs.  Family History  Problem Relation Age of Onset   Heart disease Mother    Heart disease Father 4   Hyperlipidemia Father    Diabetes Father    Cirrhosis Sister    Diabetes Brother    Heart disease Brother    Cirrhosis Brother    Pancreatic cancer Brother    Colon cancer Neg Hx    Colon polyps Neg Hx    Esophageal cancer Neg Hx    Stomach cancer Neg Hx    Inflammatory bowel disease Neg Hx     Medications: Patient's Medications  New Prescriptions   No medications on file  Previous Medications   CALCIUM-PHOSPHORUS-VITAMIN D (CALCIUM GUMMIES PO)    Take 2 tablets by mouth daily.   CETIRIZINE (ZYRTEC) 10 MG TABLET    Take 10 mg by mouth as needed for allergies.   CHOLECALCIFEROL (VITAMIN D3) 50 MCG (2000 UT) CAPS    Take 1 capsule by mouth daily in the afternoon.   DENOSUMAB (PROLIA) 60 MG/ML SOSY INJECTION    Inject 60 mg into the skin every 6 (six) months.   DEXTROMETHORPHAN-GUAIFENESIN (MUCINEX DM) 30-600 MG 12HR TABLET    Take 1 tablet by mouth 2 (two) times  daily.   DIMENHYDRINATE (DRAMAMINE) 50 MG TABLET    Take 50 mg by mouth as needed.   IBUPROFEN (ADVIL) 200 MG TABLET    Take 200 mg by mouth as needed.   IRON COMBINATIONS (IRON COMPLEX PO)    Take 1 tablet by mouth daily.   MEMANTINE (NAMENDA) 10 MG TABLET    TAKE 1 TABLET BY MOUTH TWICE A DAY   ONDANSETRON (ZOFRAN) 4 MG TABLET    Take 4 mg by mouth every 8 (eight) hours as needed for nausea or vomiting.   PANTOPRAZOLE (PROTONIX) 40 MG TABLET    TAKE 1 TABLET BY MOUTH DAILY BEFORE BREAKFAST   SENNOSIDES-DOCUSATE SODIUM (SENOKOT-S) 8.6-50 MG TABLET    Take 1 tablet by mouth daily as needed for constipation.   TIMOLOL (TIMOPTIC) 0.5 % OPHTHALMIC SOLUTION  Place 1 drop into both eyes 2 (two) times daily.    TRAMADOL (ULTRAM) 50 MG TABLET    TAKE ONE-HALF TO ONE TABLET BY MOUTH UP TO EVERY 12 HOURS AS NEEDED FOR MODERATE PAIN.   TRAZODONE (DESYREL) 100 MG TABLET    Take 1 tablet (100 mg total) by mouth at bedtime.   VITAMIN E 400 UNIT CAPSULE    Take 400 Units by mouth daily.  Modified Medications   No medications on file  Discontinued Medications   No medications on file    Physical Exam:  Vitals:   12/08/20 1444  BP: 140/70  Pulse: (!) 39  Temp: 97.8 F (36.6 C)  SpO2: 97%  Weight: 107 lb (48.5 kg)  Height: 4\' 11"  (1.499 m)   Body mass index is 21.61 kg/m. Wt Readings from Last 3 Encounters:  12/08/20 107 lb (48.5 kg)  08/21/20 104 lb 12.8 oz (47.5 kg)  05/30/20 106 lb 6.4 oz (48.3 kg)    Physical Exam Vitals reviewed.  Constitutional:      General: She is not in acute distress. HENT:     Head: Normocephalic.  Eyes:     General:        Right eye: No discharge.        Left eye: No discharge.  Neck:     Vascular: No carotid bruit.  Cardiovascular:     Rate and Rhythm: Regular rhythm. Bradycardia present.     Pulses: Normal pulses.     Heart sounds: Normal heart sounds. No murmur heard. Pulmonary:     Effort: Pulmonary effort is normal. No respiratory distress.      Breath sounds: Normal breath sounds. No wheezing.  Abdominal:     General: Bowel sounds are normal. There is no distension.     Palpations: Abdomen is soft.     Tenderness: There is no abdominal tenderness.  Musculoskeletal:     Cervical back: Normal range of motion.     Right lower leg: No edema.     Left lower leg: No edema.  Lymphadenopathy:     Cervical: No cervical adenopathy.  Skin:    General: Skin is warm and dry.     Capillary Refill: Capillary refill takes less than 2 seconds.  Neurological:     General: No focal deficit present.     Mental Status: She is alert. Mental status is at baseline.     Motor: Weakness present.     Gait: Gait abnormal.     Comments: Cane, gait abnormal, seen grabbing walls  Psychiatric:        Mood and Affect: Mood normal.        Behavior: Behavior normal.    Labs reviewed: Basic Metabolic Panel: Recent Labs    05/23/20 1004 08/21/20 1520 10/08/20 1106  NA 142 142 137  K 3.8 3.5 4.0  CL 105 107 104  CO2 32 28 28  GLUCOSE 83 79 105  BUN 12 8 9   CREATININE 0.60 0.63 0.52*  CALCIUM 9.3 7.8* 7.9*  TSH 2.54  --   --    Liver Function Tests: Recent Labs    12/21/19 1230 01/09/20 0754 05/23/20 1004  AST 33 40* 28  ALT 19 24 15   ALKPHOS 72 79  --   BILITOT 0.8 0.9 0.8  PROT 5.9* 5.5* 5.3*  ALBUMIN 3.6 3.7  --    No results for input(s): LIPASE, AMYLASE in the last 8760 hours. No results for input(s): AMMONIA in the  last 8760 hours. CBC: Recent Labs    05/23/20 1004 08/21/20 1520 10/08/20 1106  WBC 4.7 4.9 4.0  NEUTROABS 3,027 2,822 2,600  HGB 12.8 11.6* 11.7  HCT 38.7 34.9* 34.5*  MCV 93.9 92.6 91.0  PLT 167 165 148   Lipid Panel: No results for input(s): CHOL, HDL, LDLCALC, TRIG, CHOLHDL, LDLDIRECT in the last 8760 hours. TSH: Recent Labs    05/23/20 1004  TSH 2.54   A1C: Lab Results  Component Value Date   HGBA1C 4.9 01/18/2014     Assessment/Plan 1. Primary osteoarthritis involving multiple  joints - left hip pain today, exam unremarkable - recommend scheduled tramadol every morning when pain is worst - cont ambulating with cane, recommend walker if worse - cont falls safety precautions at home - traMADol (ULTRAM) 50 MG tablet; Take 1 tablet (50 mg total) by mouth 2 (two) times daily as needed for up to 5 days.  Dispense: 60 tablet; Refill: 0  2. Bradycardia - apical pulse 42 - EKG 12-Lead- sinus bradycardia - some dizziness otherwise asymptomatic - Ambulatory referral to Cardiology- urgent - Daughter advised to report to ED if symptoms of dizziness persist - CBC with Differential/Platelets- Hgb 11.5  - CMP-potassium 3.9, calcium 9.5  3. Dizziness - suspect from low HR - discontinue dramamine - falls safety precautions - cbc/diff- Hgb 11.5   Total time: 39 minutes. Greater than 50% of total time spent doing patient education regarding symptoms management.    Next appt: 02/12/2021  [name], [facility] [phone]

## 2020-12-08 NOTE — Patient Instructions (Signed)
Referral to cardiology made.   May take tramadol every morning for arthritis.   If dizziness increases, please report to ED for further evaluation.

## 2020-12-09 ENCOUNTER — Telehealth: Payer: Self-pay | Admitting: Orthopedic Surgery

## 2020-12-09 LAB — CBC WITH DIFFERENTIAL/PLATELET
Absolute Monocytes: 530 cells/uL (ref 200–950)
Basophils Absolute: 42 cells/uL (ref 0–200)
Basophils Relative: 0.8 %
Eosinophils Absolute: 217 cells/uL (ref 15–500)
Eosinophils Relative: 4.1 %
HCT: 34.6 % — ABNORMAL LOW (ref 35.0–45.0)
Hemoglobin: 11.5 g/dL — ABNORMAL LOW (ref 11.7–15.5)
Lymphs Abs: 1675 cells/uL (ref 850–3900)
MCH: 31 pg (ref 27.0–33.0)
MCHC: 33.2 g/dL (ref 32.0–36.0)
MCV: 93.3 fL (ref 80.0–100.0)
MPV: 11.6 fL (ref 7.5–12.5)
Monocytes Relative: 10 %
Neutro Abs: 2836 cells/uL (ref 1500–7800)
Neutrophils Relative %: 53.5 %
Platelets: 127 10*3/uL — ABNORMAL LOW (ref 140–400)
RBC: 3.71 10*6/uL — ABNORMAL LOW (ref 3.80–5.10)
RDW: 12.8 % (ref 11.0–15.0)
Total Lymphocyte: 31.6 %
WBC: 5.3 10*3/uL (ref 3.8–10.8)

## 2020-12-09 LAB — COMPREHENSIVE METABOLIC PANEL
AG Ratio: 1.7 (calc) (ref 1.0–2.5)
ALT: 21 U/L (ref 6–29)
AST: 35 U/L (ref 10–35)
Albumin: 3.5 g/dL — ABNORMAL LOW (ref 3.6–5.1)
Alkaline phosphatase (APISO): 87 U/L (ref 37–153)
BUN/Creatinine Ratio: 30 (calc) — ABNORMAL HIGH (ref 6–22)
BUN: 26 mg/dL — ABNORMAL HIGH (ref 7–25)
CO2: 30 mmol/L (ref 20–32)
Calcium: 9.5 mg/dL (ref 8.6–10.4)
Chloride: 106 mmol/L (ref 98–110)
Creat: 0.86 mg/dL (ref 0.60–0.95)
Globulin: 2.1 g/dL (calc) (ref 1.9–3.7)
Glucose, Bld: 83 mg/dL (ref 65–99)
Potassium: 3.9 mmol/L (ref 3.5–5.3)
Sodium: 142 mmol/L (ref 135–146)
Total Bilirubin: 1.1 mg/dL (ref 0.2–1.2)
Total Protein: 5.6 g/dL — ABNORMAL LOW (ref 6.1–8.1)

## 2020-12-09 NOTE — Addendum Note (Signed)
Addended byWindell Moulding E on: 12/09/2020 03:31 PM   Modules accepted: Orders

## 2020-12-09 NOTE — Telephone Encounter (Signed)
Son- in [place] reports Brianna Barnes is feeling better this morning. At this time she denies dizziness. They have also measured heart rate and reports it is in the 70's. Awaiting to schedule appointment with cardiology. Referral coordinator notified.

## 2020-12-12 ENCOUNTER — Ambulatory Visit (INDEPENDENT_AMBULATORY_CARE_PROVIDER_SITE_OTHER): Payer: Medicare Other

## 2020-12-12 ENCOUNTER — Encounter: Payer: Self-pay | Admitting: Cardiology

## 2020-12-12 ENCOUNTER — Other Ambulatory Visit: Payer: Self-pay

## 2020-12-12 ENCOUNTER — Ambulatory Visit: Payer: Medicare Other | Admitting: Cardiology

## 2020-12-12 VITALS — BP 180/77 | HR 73 | Ht <= 58 in | Wt 102.0 lb

## 2020-12-12 DIAGNOSIS — I441 Atrioventricular block, second degree: Secondary | ICD-10-CM

## 2020-12-12 DIAGNOSIS — R42 Dizziness and giddiness: Secondary | ICD-10-CM

## 2020-12-12 NOTE — Progress Notes (Signed)
Cardiology Office Note:    Date:  12/12/2020   ID:  Brianna Barnes, DOB 04/14/1928, MRN [id number]  PCP:  Yvonna Alanis, NP   [facility] Providers Cardiologist:  None     Referring MD: Yvonna Alanis, NP   Chief Complaint  Patient presents with   NEW patient-Referred by [name], NP for eval of dizziness    History of Present Illness:    Brianna Barnes is a 87 y.o. female with a hx of GERD, hard of hearing, DDD who presents due to dizziness.  She saw PCP 4 days ago for an acute visit due to dizziness.   Patient presents with daughter who helps with history as patient is hard of hearing.  Daughter states patient has been having problems with dizziness for some time now.  She denies chest pain, palpitations, syncope.  At the acute visit with PCP, patient's heart rate noted to be low at 42.  EKG was obtained showing bradycardia, heart rate 39.  She denies taking any AV nodal agents such as beta-blockers, calcium channel blockers.  She was previously on Dramamine which was stopped by PCP.  Blood work including hemoglobin, potassium were all unremarkable.    She still endorses dizziness.  Daughter states patient is doing a little better today, heart rate in the office 70s.  Past Medical History:  Diagnosis Date   Arthritis    Choledocholithiasis    Cirrhosis, cryptogenic (HCC)    FOLLOWED BY DR PYRTLE-- LOV  IN EPIC--  STABLE PER NOTE   Common bile duct stone    Cryptogenic cirrhosis (HCC)    DDD (degenerative disc disease), lumbar    Diverticulosis    Esophageal varix (HCC)    GERD (gastroesophageal reflux disease)    Hiatal hernia    History of colonoscopy with polypectomy    ADENOMATOUS AND HYPERPLASIA POLYPS  (2008  &  2010)   History of hepatitis C    History of positive PPD    + TB SKIN TEST WITHOUT TB   Hyperlipidemia    Hypertension, portal (HCC)    SECONDARY TO CRYPTOGENIC CIRRHOSIS   Internal hemorrhoids    Iron deficiency anemia    Loss of hearing     right ear   Osteoarthritis    hands, knees, and low back   Osteoporosis    Portal hypertension (HCC)    PUD (peptic ulcer disease)    Squamous cell carcinoma of skin 04/07/2017   left cheek   Tubular adenoma of colon    Uterovaginal prolapse, incomplete    Wears glasses    Wears hearing aid    left ear only    Past Surgical History:  Procedure Laterality Date   APPENDECTOMY  1960'S   CHOLECYSTECTOMY  08/25/2011   Procedure: LAPAROSCOPIC CHOLECYSTECTOMY WITH INTRAOPERATIVE CHOLANGIOGRAM;  Surgeon: Zenovia Jarred, MD;  Location: [place];  Service: General;  Laterality: N/A;   ENDOSCOPIC RETROGRADE CHOLANGIOPANCREATOGRAPHY (ERCP) WITH PROPOFOL N/A 11/30/2012   Procedure: ENDOSCOPIC RETROGRADE CHOLANGIOPANCREATOGRAPHY (ERCP) WITH PROPOFOL;  Surgeon: Milus Banister, MD;  Location: [facility];  Service: Endoscopy;  Laterality: N/A;   ESOPHAGOGASTRODUODENOSCOPY N/A 05/18/2018   Procedure: ESOPHAGOGASTRODUODENOSCOPY (EGD);  Surgeon: Lin Landsman, MD;  Location: [facility] ENDOSCOPY;  Service: Gastroenterology;  Laterality: N/A;   EXTERNAL EAR SURGERY Left 1980   HYSTEROSCOPY WITH D & C N/A 07/15/2014   Procedure: DILATATION AND CURETTAGE /HYSTEROSCOPY;  Surgeon: Molli Posey, MD;  Location: [place];  Service: Gynecology;  Laterality: N/A;   INTRAMEDULLARY (IM) NAIL INTERTROCHANTERIC Left 01/22/2018   Procedure: ORIF LEFT INTERTROCHANTRIC HIP FX;  Surgeon: Mcarthur Rossetti, MD;  Location: [place];  Service: Orthopedics;  Laterality: Left;   KNEE ARTHROSCOPY W/ MENISCECTOMY Right 06-19-2002   LUMBAR DISC SURGERY  1990   REMOVAL OF URINARY SLING N/A 09/13/2013   Procedure: REMOVAL OF RETAINED PESSARY;  Surgeon: Jorja Loa, MD;  Location: [facility];  Service: Urology;  Laterality: N/A;    Current Medications: Current Meds  Medication Sig   Calcium-Phosphorus-Vitamin D (CALCIUM GUMMIES PO) Take 2 tablets by mouth daily.   cetirizine  (ZYRTEC) 10 MG tablet Take 10 mg by mouth as needed for allergies.   Cholecalciferol (VITAMIN D3) 50 MCG (2000 UT) CAPS Take 1 capsule by mouth daily in the afternoon.   denosumab (PROLIA) 60 MG/ML SOSY injection Inject 60 mg into the skin every 6 (six) months.   dextromethorphan-guaiFENesin (MUCINEX DM) 30-600 MG 12hr tablet Take 1 tablet by mouth 2 (two) times daily.   ibuprofen (ADVIL) 200 MG tablet Take 200 mg by mouth as needed.   Iron Combinations (IRON COMPLEX PO) Take 1 tablet by mouth daily.   memantine (NAMENDA) 10 MG tablet TAKE 1 TABLET BY MOUTH TWICE A DAY   ondansetron (ZOFRAN) 4 MG tablet Take 4 mg by mouth every 8 (eight) hours as needed for nausea or vomiting.   pantoprazole (PROTONIX) 40 MG tablet TAKE 1 TABLET BY MOUTH DAILY BEFORE BREAKFAST   sennosides-docusate sodium (SENOKOT-S) 8.6-50 MG tablet Take 1 tablet by mouth daily as needed for constipation.   timolol (TIMOPTIC) 0.5 % ophthalmic solution Place 1 drop into both eyes 2 (two) times daily.    traMADol (ULTRAM) 50 MG tablet Take 1 tablet (50 mg total) by mouth 2 (two) times daily as needed for up to 5 days.   traZODone (DESYREL) 100 MG tablet Take 1 tablet (100 mg total) by mouth at bedtime.   vitamin E 400 UNIT capsule Take 400 Units by mouth daily.     Allergies:   Patient has no known allergies.   Social History   Socioeconomic History   Marital status: Widowed    Spouse name: Not on file   Number of children: 3   Years of education: Not on file   Highest education level: Not on file  Occupational History   Occupation: Retired, Licensed conveyancer  Tobacco Use   Smoking status: Never   Smokeless tobacco: Never  Vaping Use   Vaping Use: Never used  Substance and Sexual Activity   Alcohol use: No   Drug use: No   Sexual activity: Not Currently  Other Topics Concern   Not on file  Social History Narrative       Social Determinants of Health   Financial Resource Strain: Not on file  Food Insecurity: Not  on file  Transportation Needs: Not on file  Physical Activity: Not on file  Stress: Not on file  Social Connections: Not on file     Family History: The patient's family history includes Cirrhosis in her brother and sister; Diabetes in her brother and father; Heart disease in her brother and mother; Heart disease (age of onset: 24) in her father; Hyperlipidemia in her father; Pancreatic cancer in her brother. There is no history of Colon cancer, Colon polyps, Esophageal cancer, Stomach cancer, or Inflammatory bowel disease.  ROS:   Please see the history of present illness.     All other systems  reviewed and are negative.  EKGs/Labs/Other Studies Reviewed:    The following studies were reviewed today:   EKG:  EKG is  ordered today.  The ekg ordered today demonstrates sinus rhythm, occasional PVCs  Recent Labs: 05/23/2020: TSH 2.54 12/08/2020: ALT 21; BUN 26; Creat 0.86; Hemoglobin 11.5; Platelets 127; Potassium 3.9; Sodium 142  Recent Lipid Panel    Component Value Date/Time   CHOL 179 01/26/2016 0807   TRIG 96.0 01/26/2016 0807   HDL 47.00 01/26/2016 0807   CHOLHDL 4 01/26/2016 0807   VLDL 19.2 01/26/2016 0807   LDLCALC 112 (H) 01/26/2016 0807   LDLDIRECT 135.2 06/06/2009 0912     Risk Assessment/Calculations:          Physical Exam:    VS:  BP (!) 180/77 (BP Location: Right Arm, Patient Position: Sitting, Cuff Size: Normal)   Pulse 73   Ht 4\' 10"  (1.473 m)   Wt 102 lb (46.3 kg)   SpO2 98%   BMI 21.32 kg/m     Wt Readings from Last 3 Encounters:  12/12/20 102 lb (46.3 kg)  12/08/20 107 lb (48.5 kg)  08/21/20 104 lb 12.8 oz (47.5 kg)     GEN:  Well nourished, well developed in no acute distress HEENT: Normal NECK: No JVD; No carotid bruits LYMPHATICS: No lymphadenopathy CARDIAC: RRR, no murmurs, rubs, gallops RESPIRATORY:  Clear to auscultation without rales, wheezing or rhonchi  ABDOMEN: Soft, non-tender, non-distended MUSCULOSKELETAL:  No edema; No  deformity  SKIN: Warm and dry NEUROLOGIC:  Alert and oriented x 3 PSYCHIATRIC:  Normal affect   ASSESSMENT:    1. Mobitz II   2. Dizziness    PLAN:    In order of problems listed above:  Bradycardia, EKG from PCP office 12/08/2020 showing Mobitz 2 AV block, heart rate 39.  EKG today sinus rhythm heart rate 73, occasional PVCs.  Place cardiac monitor to evaluate frequency of high degree AV block.  Obtain echocardiogram.  Refer patient to EP for additional input. Dizziness, orthostatic vitals with no evidence for orthostasis, felt dizzy from lying to sitting, component of positional vertigo likely.  Follow-up after echo and cardiac monitor.     Medication Adjustments/Labs and Tests Ordered: Current medicines are reviewed at length with the patient today.  Concerns regarding medicines are outlined above.  Orders Placed This Encounter  Procedures   Ambulatory referral to Cardiac Electrophysiology   LONG TERM MONITOR (3-14 DAYS)   EKG 12-Lead   ECHOCARDIOGRAM COMPLETE    No orders of the defined types were placed in this encounter.   Patient Instructions  Medication Instructions:  Your physician recommends that you continue on your current medications as directed. Please refer to the Current Medication list given to you today.  *If you need a refill on your cardiac medications before your next appointment, please call your pharmacy*   Lab Work: None ordered If you have labs (blood work) drawn today and your tests are completely normal, you will receive your results only by: [place] (if you have MyChart) OR A paper copy in the mail If you have any lab test that is abnormal or we need to change your treatment, we will call you to review the results.   Testing/Procedures:   Your physician has requested that you have an echocardiogram. Echocardiography is a painless test that uses sound waves to create images of your heart. It provides your doctor with information  about the size and shape of your heart and how well  your heart's chambers and valves are working. This procedure takes approximately one hour. There are no restrictions for this procedure.   Your physician has recommended that you wear a Zio XT monitor for 2 weeks.  This monitor is a medical device that records the heart's electrical activity. Doctors most often use these monitors to diagnose arrhythmias. Arrhythmias are problems with the speed or rhythm of the heartbeat. The monitor is a small device applied to your chest. You can wear one while you do your normal daily activities. While wearing this monitor if you have any symptoms to push the button and record what you felt. Once you have worn this monitor for the period of time provider prescribed (Usually 14 days), you will return the monitor device in the postage paid box. Once it is returned they will download the data collected and provide [place] with a report which the provider will then review and we will call you with those results. Important tips:  Avoid showering during the first 24 hours of wearing the monitor. Avoid excessive sweating to help maximize wear time. Do not submerge the device, no hot tubs, and no swimming pools. Keep any lotions or oils away from the patch. After 24 hours you may shower with the patch on. Take brief showers with your back facing the shower head.  Do not remove patch once it has been placed because that will interrupt data and decrease adhesive wear time. Push the button when you have any symptoms and write down what you were feeling. Once you have completed wearing your monitor, remove and place into box which has postage paid and place in your outgoing mailbox.  If for some reason you have misplaced your box then call our office and we can provide another box and/or mail it off for you.      Follow-Up: At [facility], you and your health needs are our priority.  As part of our continuing mission to  provide you with exceptional heart care, we have created designated Provider Care Teams.  These Care Teams include your primary Cardiologist (physician) and Advanced Practice Providers (APPs -  Physician Assistants and Nurse Practitioners) who all work together to provide you with the care you need, when you need it.  We recommend signing up for the patient portal called "MyChart".  Sign up information is provided on this After Visit Summary.  MyChart is used to connect with patients for Virtual Visits (Telemedicine).  Patients are able to view lab/test results, encounter notes, upcoming appointments, etc.  Non-urgent messages can be sent to your provider as well.   To learn more about what you can do with MyChart, go to [email or web address].    Your next appointment:   6 week(s)  The format for your next appointment:   In Person  Provider:   [name], MD   Other Instructions    Signed, [name], MD  12/12/2020 1:16 PM    [place]

## 2020-12-12 NOTE — Patient Instructions (Signed)
Medication Instructions:  Your physician recommends that you continue on your current medications as directed. Please refer to the Current Medication list given to you today.  *If you need a refill on your cardiac medications before your next appointment, please call your pharmacy*   Lab Work: None ordered If you have labs (blood work) drawn today and your tests are completely normal, you will receive your results only by: [place] (if you have MyChart) OR A paper copy in the mail If you have any lab test that is abnormal or we need to change your treatment, we will call you to review the results.   Testing/Procedures:   Your physician has requested that you have an echocardiogram. Echocardiography is a painless test that uses sound waves to create images of your heart. It provides your doctor with information about the size and shape of your heart and how well your heart's chambers and valves are working. This procedure takes approximately one hour. There are no restrictions for this procedure.   Your physician has recommended that you wear a Zio XT monitor for 2 weeks.  This monitor is a medical device that records the heart's electrical activity. Doctors most often use these monitors to diagnose arrhythmias. Arrhythmias are problems with the speed or rhythm of the heartbeat. The monitor is a small device applied to your chest. You can wear one while you do your normal daily activities. While wearing this monitor if you have any symptoms to push the button and record what you felt. Once you have worn this monitor for the period of time provider prescribed (Usually 14 days), you will return the monitor device in the postage paid box. Once it is returned they will download the data collected and provide [place] with a report which the provider will then review and we will call you with those results. Important tips:  Avoid showering during the first 24 hours of wearing the monitor. Avoid  excessive sweating to help maximize wear time. Do not submerge the device, no hot tubs, and no swimming pools. Keep any lotions or oils away from the patch. After 24 hours you may shower with the patch on. Take brief showers with your back facing the shower head.  Do not remove patch once it has been placed because that will interrupt data and decrease adhesive wear time. Push the button when you have any symptoms and write down what you were feeling. Once you have completed wearing your monitor, remove and place into box which has postage paid and place in your outgoing mailbox.  If for some reason you have misplaced your box then call our office and we can provide another box and/or mail it off for you.      Follow-Up: At [facility], you and your health needs are our priority.  As part of our continuing mission to provide you with exceptional heart care, we have created designated Provider Care Teams.  These Care Teams include your primary Cardiologist (physician) and Advanced Practice Providers (APPs -  Physician Assistants and Nurse Practitioners) who all work together to provide you with the care you need, when you need it.  We recommend signing up for the patient portal called "MyChart".  Sign up information is provided on this After Visit Summary.  MyChart is used to connect with patients for Virtual Visits (Telemedicine).  Patients are able to view lab/test results, encounter notes, upcoming appointments, etc.  Non-urgent messages can be sent to your provider as well.  To learn more about what you can do with MyChart, go to [email or web address].    Your next appointment:   6 week(s)  The format for your next appointment:   In Person  Provider:   [name], MD   Other Instructions

## 2020-12-14 ENCOUNTER — Emergency Department
Admission: EM | Admit: 2020-12-14 | Discharge: 2020-12-15 | Disposition: A | Discharge status: Other Acute Inpatient Hospital | Payer: Medicare Other | Attending: Emergency Medicine | Admitting: Emergency Medicine

## 2020-12-14 ENCOUNTER — Emergency Department: Payer: Medicare Other

## 2020-12-14 ENCOUNTER — Other Ambulatory Visit: Payer: Self-pay

## 2020-12-14 DIAGNOSIS — I441 Atrioventricular block, second degree: Secondary | ICD-10-CM | POA: Diagnosis not present

## 2020-12-14 DIAGNOSIS — I455 Other specified heart block: Secondary | ICD-10-CM | POA: Diagnosis not present

## 2020-12-14 DIAGNOSIS — R42 Dizziness and giddiness: Secondary | ICD-10-CM

## 2020-12-14 DIAGNOSIS — I459 Conduction disorder, unspecified: Secondary | ICD-10-CM | POA: Diagnosis not present

## 2020-12-14 DIAGNOSIS — Z85828 Personal history of other malignant neoplasm of skin: Secondary | ICD-10-CM | POA: Diagnosis not present

## 2020-12-14 DIAGNOSIS — Z20822 Contact with and (suspected) exposure to covid-19: Secondary | ICD-10-CM | POA: Insufficient documentation

## 2020-12-14 DIAGNOSIS — Z85038 Personal history of other malignant neoplasm of large intestine: Secondary | ICD-10-CM | POA: Insufficient documentation

## 2020-12-14 DIAGNOSIS — I10 Essential (primary) hypertension: Secondary | ICD-10-CM

## 2020-12-14 DIAGNOSIS — R0602 Shortness of breath: Secondary | ICD-10-CM | POA: Diagnosis not present

## 2020-12-14 DIAGNOSIS — R9431 Abnormal electrocardiogram [ECG] [EKG]: Secondary | ICD-10-CM | POA: Diagnosis not present

## 2020-12-14 LAB — RESP PANEL BY RT-PCR (FLU A&B, COVID) ARPGX2
Influenza A by PCR: NEGATIVE
Influenza B by PCR: NEGATIVE
SARS Coronavirus 2 by RT PCR: NEGATIVE

## 2020-12-14 LAB — BRAIN NATRIURETIC PEPTIDE: B Natriuretic Peptide: 241.8 pg/mL — ABNORMAL HIGH (ref 0.0–100.0)

## 2020-12-14 LAB — BASIC METABOLIC PANEL
Anion gap: 9 (ref 5–15)
BUN: 13 mg/dL (ref 8–23)
CO2: 28 mmol/L (ref 22–32)
Calcium: 8.2 mg/dL — ABNORMAL LOW (ref 8.9–10.3)
Chloride: 103 mmol/L (ref 98–111)
Creatinine, Ser: 0.56 mg/dL (ref 0.44–1.00)
GFR, Estimated: >60 mL/min (ref >60)
Glucose, Bld: 94 mg/dL (ref 70–99)
Potassium: 3.5 mmol/L (ref 3.5–5.1)
Sodium: 140 mmol/L (ref 135–145)

## 2020-12-14 LAB — CBC
HCT: 34.5 % — ABNORMAL LOW (ref 36.0–46.0)
Hemoglobin: 11.9 g/dL — ABNORMAL LOW (ref 12.0–15.0)
MCH: 32 pg (ref 26.0–34.0)
MCHC: 34.5 g/dL (ref 30.0–36.0)
MCV: 92.7 fL (ref 80.0–100.0)
Platelets: 138 10*3/uL — ABNORMAL LOW (ref 150–400)
RBC: 3.72 MIL/uL — ABNORMAL LOW (ref 3.87–5.11)
RDW: 13.5 % (ref 11.5–15.5)
WBC: 4.2 10*3/uL (ref 4.0–10.5)
nRBC: 0 % (ref 0.0–0.2)

## 2020-12-14 LAB — TROPONIN I (HIGH SENSITIVITY)
Troponin I (High Sensitivity): 8 ng/L (ref <18)
Troponin I (High Sensitivity): 7 ng/L (ref <18)

## 2020-12-14 MED ORDER — TRAZODONE HCL 100 MG PO TABS
100.0000 mg | ORAL_TABLET | Freq: Every day | ORAL | Status: DC
  Administered 2020-12-14: 100 mg via ORAL
  Filled 2020-12-14: qty 1

## 2020-12-14 MED ORDER — IBUPROFEN 400 MG PO TABS: 200.0000 mg | ORAL_TABLET | Freq: Four times a day (QID) | ORAL | Status: DC | PRN

## 2020-12-14 MED ORDER — SENNOSIDES-DOCUSATE SODIUM 8.6-50 MG PO TABS: 1.0000 | ORAL_TABLET | Freq: Every day | ORAL | Status: DC | PRN

## 2020-12-14 MED ORDER — ALBUTEROL SULFATE (2.5 MG/3ML) 0.083% IN NEBU: 3.0000 mL | INHALATION_SOLUTION | RESPIRATORY_TRACT | Status: DC | PRN

## 2020-12-14 MED ORDER — MEMANTINE HCL 5 MG PO TABS
10.0000 mg | ORAL_TABLET | Freq: Two times a day (BID) | ORAL | Status: DC
  Administered 2020-12-14 – 2020-12-15 (×2): 10 mg via ORAL
  Filled 2020-12-14 (×2): qty 2

## 2020-12-14 MED ORDER — LORATADINE 10 MG PO TABS: 10.0000 mg | ORAL_TABLET | Freq: Every day | ORAL | Status: DC | PRN

## 2020-12-14 MED ORDER — TRAMADOL HCL 50 MG PO TABS
50.0000 mg | ORAL_TABLET | Freq: Two times a day (BID) | ORAL | Status: DC | PRN
Start: 2020-12-14 — End: 2020-12-15

## 2020-12-14 MED ORDER — PANTOPRAZOLE SODIUM 40 MG PO TBEC
40.0000 mg | DELAYED_RELEASE_TABLET | Freq: Every day | ORAL | Status: DC
  Administered 2020-12-15: 40 mg via ORAL
  Filled 2020-12-14: qty 1

## 2020-12-14 MED ORDER — VITAMIN D 25 MCG (1000 UNIT) PO TABS
2000.0000 [IU] | ORAL_TABLET | Freq: Every day | ORAL | Status: DC
  Administered 2020-12-14: 2000 [IU] via ORAL
  Filled 2020-12-14: qty 2

## 2020-12-14 MED ORDER — DM-GUAIFENESIN ER 30-600 MG PO TB12
1.0000 | ORAL_TABLET | Freq: Two times a day (BID) | ORAL | Status: DC
  Administered 2020-12-15: 1 via ORAL
  Filled 2020-12-14 (×2): qty 1

## 2020-12-14 MED ORDER — ONDANSETRON HCL 4 MG PO TABS: 4.0000 mg | ORAL_TABLET | Freq: Three times a day (TID) | ORAL | Status: DC | PRN

## 2020-12-14 MED ORDER — TIMOLOL MALEATE 0.5 % OP SOLN
1.0000 [drp] | Freq: Two times a day (BID) | OPHTHALMIC | Status: DC
  Administered 2020-12-14 – 2020-12-15 (×2): 1 [drp] via OPHTHALMIC
  Filled 2020-12-14: qty 5

## 2020-12-14 NOTE — ED Notes (Signed)
Spoke to [employer]  waiting for bed assignment

## 2020-12-14 NOTE — Consult Note (Signed)
Cardiology Consultation:   Patient ID: Brianna Barnes MRN: [id number]; DOB: [birth year]  Admit date: 12/14/2020 Date of Consult: 12/14/2020  PCP:  Yvonna Alanis, NP   [facility] Providers Cardiologist:  None   dizziness for which she saw Dr. Waldron Session last week following ECG   Patient Profile:   Brianna Barnes is a 87 y.o. female with a hx of intermittent dizziness for which she saw Dr. Waldron Session last week with an ECG demonstrating 2:1 heart block and a narrow QRS escape for which he prescribed a Zio patch who is being seen 12/14/2020 for the evaluation of recurrent dizziness now with association with documented symptomatic 2 AVB 2  at the request of Dr Cherylann Banas .  She also has hypertension  History of Present Illness:   Brianna Barnes to the emergency room with complaints of episodic dizziness and elevated blood pressure.  While Dr. Ninfa Meeker was in the room, she had recurrent dizziness and her heart rate was simultaneously noted to go from the mid 70s to the mid 36s.  Telemetry review as below demonstrated intermittent second-degree AV block without PR prolongation consistent with Mobitz 2 heart block.  She has had no falls but has had recurrent dizziness which have been very brief.  No chest discomfort.  No real dyspnea.  No peripheral edema nocturnal dyspnea or orthopnea.  No palpitations.  No real history of hypertension although she was noted to be 180 in the office last weekAnd again is 190 today    Date Cr K Hgb  10/22 0.56 3.5 11.9            Past Medical History:  Diagnosis Date   Arthritis    Choledocholithiasis    Cirrhosis, cryptogenic (HCC)    FOLLOWED BY DR PYRTLE-- LOV  IN EPIC--  STABLE PER NOTE   Common bile duct stone    Cryptogenic cirrhosis (HCC)    DDD (degenerative disc disease), lumbar    Diverticulosis    Esophageal varix (HCC)    GERD (gastroesophageal reflux disease)    Hiatal hernia    History of colonoscopy with polypectomy     ADENOMATOUS AND HYPERPLASIA POLYPS  (2008  &  2010)   History of hepatitis C    History of positive PPD    + TB SKIN TEST WITHOUT TB   Hyperlipidemia    Hypertension, portal (HCC)    SECONDARY TO CRYPTOGENIC CIRRHOSIS   Internal hemorrhoids    Iron deficiency anemia    Loss of hearing    right ear   Osteoarthritis    hands, knees, and low back   Osteoporosis    Portal hypertension (HCC)    PUD (peptic ulcer disease)    Squamous cell carcinoma of skin 04/07/2017   left cheek   Tubular adenoma of colon    Uterovaginal prolapse, incomplete    Wears glasses    Wears hearing aid    left ear only    Past Surgical History:  Procedure Laterality Date   APPENDECTOMY  1960'S   CHOLECYSTECTOMY  08/25/2011   Procedure: LAPAROSCOPIC CHOLECYSTECTOMY WITH INTRAOPERATIVE CHOLANGIOGRAM;  Surgeon: Zenovia Jarred, MD;  Location: [place];  Service: General;  Laterality: N/A;   ENDOSCOPIC RETROGRADE CHOLANGIOPANCREATOGRAPHY (ERCP) WITH PROPOFOL N/A 11/30/2012   Procedure: ENDOSCOPIC RETROGRADE CHOLANGIOPANCREATOGRAPHY (ERCP) WITH PROPOFOL;  Surgeon: Milus Banister, MD;  Location: [facility];  Service: Endoscopy;  Laterality: N/A;   ESOPHAGOGASTRODUODENOSCOPY N/A 05/18/2018   Procedure: ESOPHAGOGASTRODUODENOSCOPY (EGD);  Surgeon: Lin Landsman,  MD;  Location: [facility];  Service: Gastroenterology;  Laterality: N/A;   EXTERNAL EAR SURGERY Left 1980   HYSTEROSCOPY WITH D & C N/A 07/15/2014   Procedure: DILATATION AND CURETTAGE /HYSTEROSCOPY;  Surgeon: Molli Posey, MD;  Location: [place];  Service: Gynecology;  Laterality: N/A;   INTRAMEDULLARY (IM) NAIL INTERTROCHANTERIC Left 01/22/2018   Procedure: ORIF LEFT INTERTROCHANTRIC HIP FX;  Surgeon: Mcarthur Rossetti, MD;  Location: [place];  Service: Orthopedics;  Laterality: Left;   KNEE ARTHROSCOPY W/ MENISCECTOMY Right 06-19-2002   LUMBAR DISC SURGERY  1990   REMOVAL OF URINARY SLING N/A 09/13/2013   Procedure:  REMOVAL OF RETAINED PESSARY;  Surgeon: Jorja Loa, MD;  Location: [facility];  Service: Urology;  Laterality: N/A;     Home Medications:  Prior to Admission medications   Medication Sig Start Date End Date Taking? Authorizing Provider  Calcium-Phosphorus-Vitamin D (CALCIUM GUMMIES PO) Take 2 tablets by mouth daily.    [provider]  cetirizine (ZYRTEC) 10 MG tablet Take 10 mg by mouth as needed for allergies.    [provider]  Cholecalciferol (VITAMIN D3) 50 MCG (2000 UT) CAPS Take 1 capsule by mouth daily in the afternoon.    [provider]  denosumab (PROLIA) 60 MG/ML SOSY injection Inject 60 mg into the skin every 6 (six) months.    [provider]  dextromethorphan-guaiFENesin (MUCINEX DM) 30-600 MG 12hr tablet Take 1 tablet by mouth 2 (two) times daily.    [provider]  ibuprofen (ADVIL) 200 MG tablet Take 200 mg by mouth as needed.    [provider]  Iron Combinations (IRON COMPLEX PO) Take 1 tablet by mouth daily.    [provider]  memantine (NAMENDA) 10 MG tablet TAKE 1 TABLET BY MOUTH TWICE A DAY 02/22/20   Bedsole, [name] E, MD  ondansetron (ZOFRAN) 4 MG tablet Take 4 mg by mouth every 8 (eight) hours as needed for nausea or vomiting.    [provider]  pantoprazole (PROTONIX) 40 MG tablet TAKE 1 TABLET BY MOUTH DAILY BEFORE BREAKFAST 05/19/20   Fargo, [name] E, NP  sennosides-docusate sodium (SENOKOT-S) 8.6-50 MG tablet Take 1 tablet by mouth daily as needed for constipation.    [provider]  timolol (TIMOPTIC) 0.5 % ophthalmic solution Place 1 drop into both eyes 2 (two) times daily.  01/22/15   [provider]  traZODone (DESYREL) 100 MG tablet Take 1 tablet (100 mg total) by mouth at bedtime. 12/08/20   Fargo, [name] E, NP  vitamin E 400 UNIT capsule Take 400 Units by mouth daily.    [provider]    Inpatient Medications: Scheduled Meds:  Continuous  Infusions:  PRN Meds: albuterol  Allergies:   No Known Allergies  Social History:   Social History   Socioeconomic History   Marital status: Widowed    Spouse name: Not on file   Number of children: 3   Years of education: Not on file   Highest education level: Not on file  Occupational History   Occupation: Retired, Licensed conveyancer  Tobacco Use   Smoking status: Never   Smokeless tobacco: Never  Vaping Use   Vaping Use: Never used  Substance and Sexual Activity   Alcohol use: No   Drug use: No   Sexual activity: Not Currently  Other Topics Concern   Not on file  Social History Narrative       Social Determinants of Health  Financial Resource Strain: Not on file  Food Insecurity: Not on file  Transportation Needs: Not on file  Physical Activity: Not on file  Stress: Not on file  Social Connections: Not on file  Intimate Partner Violence: Not on file    Family History:    v Family History  Problem Relation Age of Onset   Heart disease Mother    Heart disease Father 65   Hyperlipidemia Father    Diabetes Father    Cirrhosis Sister    Diabetes Brother    Heart disease Brother    Cirrhosis Brother    Pancreatic cancer Brother    Colon cancer Neg Hx    Colon polyps Neg Hx    Esophageal cancer Neg Hx    Stomach cancer Neg Hx    Inflammatory bowel disease Neg Hx      ROS:  Please see the history of present illness.    All other ROS reviewed and negative.     Physical Exam/Data:   Vitals:   12/14/20 0938 12/14/20 1141 12/14/20 1243 12/14/20 1340  BP: (!) 183/72 (!) 193/77 (!) 161/67 (!) 176/82  Pulse: 77 70 65 67  Resp: 18 18 18 18   Temp: 98.2 F (36.8 C)     SpO2: 100% 100% 100% 100%   No intake or output data in the 24 hours ending 12/14/20 1347 Last 3 Weights 12/12/2020 12/08/2020 08/21/2020  Weight (lbs) 102 lb 107 lb 104 lb 12.8 oz  Weight (kg) 46.267 kg 48.535 kg 47.537 kg     There is no height or weight on file to calculate BMI.  General:  Small and healthy appearing elderly female, in no acute distress.  Hard of hearing HEENT: normal Neck: no JVD Vascular: No carotid bruits; Distal pulses 2+ bilaterally Cardiac:  normal S1, S2; RRR; no murmur   Lungs:  clear to auscultation bilaterally, no wheezing, rhonchi or rales  Abd: soft, nontender, no hepatomegaly  Ext: no edema Musculoskeletal:  No deformities, BUE and BLE strength normal and equal/kyphosis Skin: warm and dry  Neuro:  CNs 2-12 intact, no focal abnormalities noted Psych:  Normal affect   EKG:  The EKG was personally reviewed and demonstrates: 12/08/20    Sinus with 2:1 AVB and narrow QRS escape 12/12/20 Sinus @ 73 1:1 conduction Telemetry:  Telemetry was personally reviewed and demonstrates:  sinus with freq  episodes of intermittent 2 AVB 2, repeatedly without PR prolongation, some episodes lasting 1-2 beats and othres foing on for seconds   Relevant CV Studies:    Laboratory Data:  High Sensitivity Troponin:   Recent Labs  Lab 12/14/20 0928  TROPONINIHS 8     Chemistry Recent Labs  Lab 12/08/20 1536 12/14/20 0928  NA 142 140  K 3.9 3.5  CL 106 103  CO2 30 28  GLUCOSE 83 94  BUN 26* 13  CREATININE 0.86 0.56  CALCIUM 9.5 8.2*  GFRNONAA  --  >60  ANIONGAP  --  9    Recent Labs  Lab 12/08/20 1536  PROT 5.6*  AST 35  ALT 21  BILITOT 1.1   Lipids No results for input(s): CHOL, TRIG, HDL, LABVLDL, LDLCALC, CHOLHDL in the last 168 hours.  Hematology Recent Labs  Lab 12/08/20 1536 12/14/20 0928  WBC 5.3 4.2  RBC 3.71* 3.72*  HGB 11.5* 11.9*  HCT 34.6* 34.5*  MCV 93.3 92.7  MCH 31.0 32.0  MCHC 33.2 34.5  RDW 12.8 13.5  PLT 127* 138*   Thyroid  No results for input(s): TSH, FREET4 in the last 168 hours.  BNP Recent Labs  Lab 12/14/20 0928  BNP 241.8*    DDimer No results for input(s): DDIMER in the last 168 hours.   Radiology/Studies:  DG Chest 2 View  Result Date: 12/14/2020 CLINICAL DATA:  Shortness of breath. EXAM: CHEST  - 2 VIEW COMPARISON:  Chest radiograph 01/21/2018 FINDINGS: Stable enlarged cardiac and mediastinal contours. No large area pulmonary consolidation. No pleural effusion or pneumothorax. Cholecystectomy clips. IMPRESSION: No acute cardiopulmonary process. Electronically Signed   By: Lovey Newcomer M.D.   On: 12/14/2020 10:17     Assessment and Plan:   2AVB 2 symptomatic Hypertension Dementia-mild   The patient has documented Mobitz 2 second-degree AV block and now clinical correlation associated with symptoms.  Patient is appropriate for relief of symptoms.  I have reviewed this with her and her daughter, and I think it can be more expeditiously accomplished at [facility].  The family is agreeable and we will arrange for transfer  We will begin her on amlodipine 2.5 for blood pressure, appreciating the long half-life may take it if you days to kick in and will use hydralazine as needed for blood pressures greater than 180.     Risk Assessment/Risk Scores:   he has declined: So the alcohol that I ficin that the you guys did this all the time and we never do it all the time whenever  For questions or updates, please contact [place] Please consult [email or web address] for contact info under    Signed, Virl Axe, MD  12/14/2020 1:47 PM

## 2020-12-14 NOTE — ED Provider Notes (Signed)
[facility] Emergency Department Provider Note ____________________________________________   Event Date/Time   First MD Initiated Contact with Patient 12/14/20 1125     (approximate)  I have reviewed the triage vital signs and the nursing notes.   HISTORY  Chief Complaint Shortness of Breath and Hypertension   HPI Brianna Barnes is a 87 y.o. female with PMH as noted below who presents with high blood pressure and episodes of dizziness.  Per the daughter, the patient awoke today and felt lightheaded and shaky.  They then checked her blood pressure, and it was 198/89.  The patient states that she still feels somewhat dizzy although it is better.  She denies shortness of breath or chest pain.  She has no headache.  Per the daughter, the patient has been having similar dizzy spells on and off for a while.  They sometimes last for a few seconds, but sometimes for several hours or even the whole day.  The patient were seen by the PMD last week and had an EKG which showed an arrhythmia.  She subsequently followed up with cardiology and was placed on a monitor.  She is not on any antihypertensives at this time.  Past Medical History:  Diagnosis Date   Arthritis    Choledocholithiasis    Cirrhosis, cryptogenic (HCC)    FOLLOWED BY DR PYRTLE-- LOV  IN EPIC--  STABLE PER NOTE   Common bile duct stone    Cryptogenic cirrhosis (HCC)    DDD (degenerative disc disease), lumbar    Diverticulosis    Esophageal varix (HCC)    GERD (gastroesophageal reflux disease)    Hiatal hernia    History of colonoscopy with polypectomy    ADENOMATOUS AND HYPERPLASIA POLYPS  (2008  &  2010)   History of hepatitis C    History of positive PPD    + TB SKIN TEST WITHOUT TB   Hyperlipidemia    Hypertension, portal (HCC)    SECONDARY TO CRYPTOGENIC CIRRHOSIS   Internal hemorrhoids    Iron deficiency anemia    Loss of hearing    right ear   Osteoarthritis    hands, knees,  and low back   Osteoporosis    Portal hypertension (HCC)    PUD (peptic ulcer disease)    Squamous cell carcinoma of skin 04/07/2017   left cheek   Tubular adenoma of colon    Uterovaginal prolapse, incomplete    Wears glasses    Wears hearing aid    left ear only    Patient Active Problem List   Diagnosis Date Noted   Allergic rhinitis 08/22/2019   Mild dementia 01/12/2019   History of DVT (deep vein thrombosis) 03/09/2018   Carotid artery calcification, bilateral 01/21/2018   S/p left hip fracture 01/21/2018   Situational anxiety 11/04/2017   Chronic insomnia 04/03/2016   Malnutrition of moderate degree ([place]) 01/30/2016   Counseling regarding end of life decision making 01/25/2014   Choledocholithiasis 11/30/2012   Cryptogenic cirrhosis ([place]) 11/22/2012   Family history of early CAD 10/29/2010   Vitamin D deficiency 06/24/2009   Essential hypertension, benign 06/13/2009   Hyperlipidemia 06/06/2009   Iron deficiency anemia 09/17/2008   GERD 09/17/2008   PEPTIC ULCER DISEASE 09/17/2008   UTEROVAGINAL PROLAPSE, INCOMPLETE 09/17/2008   DJD (degenerative joint disease) 09/17/2008   Osteoporosis 09/17/2008   ARTERIOVENOUS MALFORMATION 09/17/2008   POSITIVE TB SKIN TEST, WITHOUT TUBERCULOSIS 09/17/2008   COLONIC POLYPS, BENIGN, HX OF 09/17/2008    Past  Surgical History:  Procedure Laterality Date   APPENDECTOMY  1960'S   CHOLECYSTECTOMY  08/25/2011   Procedure: LAPAROSCOPIC CHOLECYSTECTOMY WITH INTRAOPERATIVE CHOLANGIOGRAM;  Surgeon: Zenovia Jarred, MD;  Location: [place];  Service: General;  Laterality: N/A;   ENDOSCOPIC RETROGRADE CHOLANGIOPANCREATOGRAPHY (ERCP) WITH PROPOFOL N/A 11/30/2012   Procedure: ENDOSCOPIC RETROGRADE CHOLANGIOPANCREATOGRAPHY (ERCP) WITH PROPOFOL;  Surgeon: Milus Banister, MD;  Location: [facility];  Service: Endoscopy;  Laterality: N/A;   ESOPHAGOGASTRODUODENOSCOPY N/A 05/18/2018   Procedure: ESOPHAGOGASTRODUODENOSCOPY (EGD);  Surgeon: Lin Landsman, MD;  Location: [facility] ENDOSCOPY;  Service: Gastroenterology;  Laterality: N/A;   EXTERNAL EAR SURGERY Left 1980   HYSTEROSCOPY WITH D & C N/A 07/15/2014   Procedure: DILATATION AND CURETTAGE /HYSTEROSCOPY;  Surgeon: Molli Posey, MD;  Location: [place];  Service: Gynecology;  Laterality: N/A;   INTRAMEDULLARY (IM) NAIL INTERTROCHANTERIC Left 01/22/2018   Procedure: ORIF LEFT INTERTROCHANTRIC HIP FX;  Surgeon: Mcarthur Rossetti, MD;  Location: [place];  Service: Orthopedics;  Laterality: Left;   KNEE ARTHROSCOPY W/ MENISCECTOMY Right 06-19-2002   LUMBAR DISC SURGERY  1990   REMOVAL OF URINARY SLING N/A 09/13/2013   Procedure: REMOVAL OF RETAINED PESSARY;  Surgeon: Jorja Loa, MD;  Location: [facility];  Service: Urology;  Laterality: N/A;    Prior to Admission medications   Medication Sig Start Date End Date Taking? Authorizing Provider  Calcium-Phosphorus-Vitamin D (CALCIUM GUMMIES PO) Take 2 tablets by mouth daily.    [provider]  cetirizine (ZYRTEC) 10 MG tablet Take 10 mg by mouth as needed for allergies.    [provider]  Cholecalciferol (VITAMIN D3) 50 MCG (2000 UT) CAPS Take 1 capsule by mouth daily in the afternoon.    [provider]  denosumab (PROLIA) 60 MG/ML SOSY injection Inject 60 mg into the skin every 6 (six) months.    [provider]  dextromethorphan-guaiFENesin (MUCINEX DM) 30-600 MG 12hr tablet Take 1 tablet by mouth 2 (two) times daily.    [provider]  ibuprofen (ADVIL) 200 MG tablet Take 200 mg by mouth as needed.    [provider]  Iron Combinations (IRON COMPLEX PO) Take 1 tablet by mouth daily.    [provider]  memantine (NAMENDA) 10 MG tablet TAKE 1 TABLET BY MOUTH TWICE A DAY 02/22/20   Bedsole, [name] E, MD  ondansetron (ZOFRAN) 4 MG tablet Take 4 mg by mouth every 8 (eight) hours as needed for nausea or vomiting.    [provider]  pantoprazole (PROTONIX) 40 MG tablet TAKE 1 TABLET BY MOUTH DAILY BEFORE BREAKFAST 05/19/20   Fargo, [name] E, NP  sennosides-docusate sodium (SENOKOT-S) 8.6-50 MG tablet Take 1 tablet by mouth daily as needed for constipation.    [provider]  timolol (TIMOPTIC) 0.5 % ophthalmic solution Place 1 drop into both eyes 2 (two) times daily.  01/22/15   [provider]  traZODone (DESYREL) 100 MG tablet Take 1 tablet (100 mg total) by mouth at bedtime. 12/08/20   Fargo, [name] E, NP  vitamin E 400 UNIT capsule Take 400 Units by mouth daily.    [provider]    Allergies Patient has no known allergies.  Family History  Problem Relation Age of Onset   Heart disease Mother    Heart disease Father 8   Hyperlipidemia Father    Diabetes Father    Cirrhosis Sister    Diabetes Brother    Heart disease Brother  Cirrhosis Brother    Pancreatic cancer Brother    Colon cancer Neg Hx    Colon polyps Neg Hx    Esophageal cancer Neg Hx    Stomach cancer Neg Hx    Inflammatory bowel disease Neg Hx     Social History Social History   Tobacco Use   Smoking status: Never   Smokeless tobacco: Never  Vaping Use   Vaping Use: Never used  Substance Use Topics   Alcohol use: No   Drug use: No    Review of Systems  Constitutional: No fever/chills Eyes: No visual changes. ENT: No sore throat. Cardiovascular: Denies chest pain. Respiratory: Denies shortness of breath. Gastrointestinal: No vomiting or diarrhea.  Genitourinary: Negative for dysuria.  Musculoskeletal: Negative for back pain. Skin: Negative for rash. Neurological: Negative for headache.   ____________________________________________   PHYSICAL EXAM:  VITAL SIGNS: ED Triage Vitals [12/14/20 0938]  Enc Vitals Group     BP (!) 183/72     Pulse Rate 77     Resp 18     Temp 98.2 F (36.8 C)     Temp src      SpO2 100 %     Weight      Height      Head Circumference      Peak  Flow      Pain Score 0     Pain Loc      Pain Edu?      Excl. in [place]?     Constitutional: Alert and oriented. Well appearing for age and in no acute distress. Eyes: Conjunctivae are normal.  Head: Atraumatic. Nose: No congestion/rhinnorhea. Mouth/Throat: Mucous membranes are moist.   Neck: Normal range of motion.  Cardiovascular: Normal rate, regular rhythm. Grossly normal heart sounds.  Good peripheral circulation. Respiratory: Normal respiratory effort.  No retractions. Lungs CTAB. Gastrointestinal: No distention.  Musculoskeletal: No lower extremity edema.  Extremities warm and well perfused.  Neurologic:  Normal speech and language. No gross focal neurologic deficits are appreciated.  Skin:  Skin is warm and dry. No rash noted. Psychiatric: Mood and affect are normal. Speech and behavior are normal.  ____________________________________________   LABS (all labs ordered are listed, but only abnormal results are displayed)  Labs Reviewed  CBC - Abnormal; Notable for the following components:      Result Value   RBC 3.72 (*)    Hemoglobin 11.9 (*)    HCT 34.5 (*)    Platelets 138 (*)    All other components within normal limits  BASIC METABOLIC PANEL - Abnormal; Notable for the following components:   Calcium 8.2 (*)    All other components within normal limits  BRAIN NATRIURETIC PEPTIDE - Abnormal; Notable for the following components:   B Natriuretic Peptide 241.8 (*)    All other components within normal limits  RESP PANEL BY RT-PCR (FLU A&B, COVID) ARPGX2  TROPONIN I (HIGH SENSITIVITY)  TROPONIN I (HIGH SENSITIVITY)   ____________________________________________  EKG  ED ECG REPORT I, Arta Silence, the attending physician, personally viewed and interpreted this ECG.  Date: 12/14/2020 EKG Time: 0925 Rate: 73 Rhythm: normal sinus rhythm QRS Axis: normal Intervals: normal ST/T Wave abnormalities: Nonspecific ST abnormalities Narrative Interpretation:  Sinus rhythm with nonspecific ST abnormality; no evidence of acute ischemia  ____________________________________________  RADIOLOGY  Chest interpreted by me shows no focal consolidation or edema  ____________________________________________   PROCEDURES  Procedure(s) performed: No  Procedures  Critical Care performed: No ____________________________________________  INITIAL IMPRESSION / ASSESSMENT AND PLAN / ED COURSE  Pertinent labs & imaging results that were available during my care of the patient were reviewed by me and considered in my medical decision making (see chart for details).   87 year old female with PMH as noted above including GERD, hearing impairment, and DDD (but no significant prior cardiac history) presents with dizziness and hypertension.  I reviewed the past medical records in epic.  The patient was seen by her PMD on 9/26 and at that time EKG showed a Mobitz 2 block with a heart rate of 39.  She was seen by cardiology on 9/30.  At that time her EKG showed sinus rhythm with a rate in the 70s.  She had a Zio XT monitor placed, and was ordered for an echocardiogram and referred to EP.  On exam currently, the patient is well-appearing.  Her vital signs are normal except for hypertension with a blood pressure of 193/77.  Her heart rate is in the 70s, although while I was in the room with the patient she reported a wave of dizziness and the rate went down to 42.  On the monitor it appeared to be a Mobitz 2 or similar block, although it resolved before we could get an EKG and the patient's dizziness also resolved.  Exam is otherwise unremarkable.  Overall I suspect that the dizziness is more likely related to dysrhythmia and bradycardia rather than the patient's hypertension.  She has no symptoms of end organ dysfunction.  Basic labs obtained from triage are unremarkable.  I will add on a troponin and BNP and discuss with cardiology for further  recommendations.  ----------------------------------------- 2:19 PM on 12/14/2020 -----------------------------------------  Troponin is negative.  I consulted Dr. Caryl Comes from cardiology who came to evaluate the patient.  He recommends transfer to [facility] for pacemaker placement, and he will serve as the accepting physician.  The patient is stable for transfer at this time.  ----------------------------------------- 7:29 PM on 12/14/2020 -----------------------------------------  The patient is awaiting a bed at [facility].  She remains clinically stable.  She had 1 brief episode of bradycardia without dizziness or other symptoms.  I have ordered all of her home medications.   ____________________________________________   FINAL CLINICAL IMPRESSION(S) / ED DIAGNOSES  Final diagnoses:  Heart block      NEW MEDICATIONS STARTED DURING THIS VISIT:  New Prescriptions   No medications on file     Note:  This document was prepared using Dragon voice recognition software and may include unintentional dictation errors.    Arta Silence, MD 12/14/20 1929

## 2020-12-14 NOTE — ED Notes (Signed)
Called [facility] ([facility]) no bed assignment as of yet

## 2020-12-14 NOTE — ED Triage Notes (Signed)
Pt comes with c/o HTN and some SOB. Pt states last Friday she had EKG performed at MD office and was told to track BP. Family reports since tracking it has been elevated. BP this am was 198/80 something per family. Pt has been dizzy

## 2020-12-14 NOTE — ED Provider Notes (Signed)
Emergency Medicine Provider Triage Evaluation Note  Brianna Barnes , a 87 y.o. female  was evaluated in triage.  Pt complains of high blood pressure, low heart rate.  Review of Systems  Positive: Dizziness, weakness Negative: Syncope, chest pain, shortness of breath, nausea, vomiting or diarrhea  Physical Exam  There were no vitals taken for this visit. Gen:   Awake, mildly anxious appearing, HOH Resp:  Normal effort  CV:  Bradycardic, no appreciable lower extremity edema. Other:  Alert.  Medical Decision Making  Medically screening exam initiated at 9:28 AM.  Appropriate orders placed.  Brianna Barnes was informed that the remainder of the evaluation will be completed by another provider, this initial triage assessment does not replace that evaluation, and the importance of remaining in the ED until their evaluation is complete.     Brianna Fenton, NP 12/14/20 0932    Brianna Starch, MD 12/14/20 [phone]

## 2020-12-14 NOTE — ED Notes (Signed)
Called [facility] to [place] [facility]. Virl Axe  patient accepted by Dr. Caryl Comes, admitting diagnosis heart block, telemetry   1440

## 2020-12-14 NOTE — ED Notes (Signed)
Called spoke to [facility], patient waiting for bed assignment [phone]

## 2020-12-15 ENCOUNTER — Observation Stay (HOSPITAL_BASED_OUTPATIENT_CLINIC_OR_DEPARTMENT_OTHER): Payer: Medicare Other

## 2020-12-15 ENCOUNTER — Inpatient Hospital Stay (HOSPITAL_COMMUNITY)
Admission: AD | Admit: 2020-12-15 | Discharge: 2020-12-17 | DRG: 243 | Disposition: A | Discharge status: Home / Self Care | Payer: Medicare Other | Source: Other Acute Inpatient Hospital | Attending: Internal Medicine | Admitting: Internal Medicine

## 2020-12-15 DIAGNOSIS — M810 Age-related osteoporosis without current pathological fracture: Secondary | ICD-10-CM | POA: Diagnosis present

## 2020-12-15 DIAGNOSIS — Z20822 Contact with and (suspected) exposure to covid-19: Secondary | ICD-10-CM | POA: Diagnosis not present

## 2020-12-15 DIAGNOSIS — E785 Hyperlipidemia, unspecified: Secondary | ICD-10-CM | POA: Diagnosis not present

## 2020-12-15 DIAGNOSIS — D509 Iron deficiency anemia, unspecified: Secondary | ICD-10-CM | POA: Diagnosis present

## 2020-12-15 DIAGNOSIS — I441 Atrioventricular block, second degree: Secondary | ICD-10-CM | POA: Diagnosis not present

## 2020-12-15 DIAGNOSIS — K219 Gastro-esophageal reflux disease without esophagitis: Secondary | ICD-10-CM | POA: Diagnosis present

## 2020-12-15 DIAGNOSIS — H9191 Unspecified hearing loss, right ear: Secondary | ICD-10-CM | POA: Diagnosis not present

## 2020-12-15 DIAGNOSIS — Z8711 Personal history of peptic ulcer disease: Secondary | ICD-10-CM | POA: Diagnosis not present

## 2020-12-15 DIAGNOSIS — Z79899 Other long term (current) drug therapy: Secondary | ICD-10-CM | POA: Diagnosis not present

## 2020-12-15 DIAGNOSIS — F03A0 Unspecified dementia, mild, without behavioral disturbance, psychotic disturbance, mood disturbance, and anxiety: Secondary | ICD-10-CM | POA: Diagnosis not present

## 2020-12-15 DIAGNOSIS — B1920 Unspecified viral hepatitis C without hepatic coma: Secondary | ICD-10-CM | POA: Diagnosis present

## 2020-12-15 DIAGNOSIS — I10 Essential (primary) hypertension: Secondary | ICD-10-CM | POA: Diagnosis not present

## 2020-12-15 DIAGNOSIS — R54 Age-related physical debility: Secondary | ICD-10-CM | POA: Diagnosis present

## 2020-12-15 DIAGNOSIS — R001 Bradycardia, unspecified: Secondary | ICD-10-CM | POA: Diagnosis present

## 2020-12-15 DIAGNOSIS — K7469 Other cirrhosis of liver: Secondary | ICD-10-CM | POA: Diagnosis not present

## 2020-12-15 DIAGNOSIS — Z85828 Personal history of other malignant neoplasm of skin: Secondary | ICD-10-CM

## 2020-12-15 DIAGNOSIS — Z959 Presence of cardiac and vascular implant and graft, unspecified: Secondary | ICD-10-CM

## 2020-12-15 DIAGNOSIS — M1990 Unspecified osteoarthritis, unspecified site: Secondary | ICD-10-CM | POA: Diagnosis present

## 2020-12-15 DIAGNOSIS — Z8249 Family history of ischemic heart disease and other diseases of the circulatory system: Secondary | ICD-10-CM | POA: Diagnosis not present

## 2020-12-15 DIAGNOSIS — Z974 Presence of external hearing-aid: Secondary | ICD-10-CM | POA: Diagnosis not present

## 2020-12-15 DIAGNOSIS — K766 Portal hypertension: Secondary | ICD-10-CM | POA: Diagnosis present

## 2020-12-15 DIAGNOSIS — Z85038 Personal history of other malignant neoplasm of large intestine: Secondary | ICD-10-CM | POA: Diagnosis not present

## 2020-12-15 DIAGNOSIS — I459 Conduction disorder, unspecified: Secondary | ICD-10-CM | POA: Diagnosis not present

## 2020-12-15 DIAGNOSIS — Z950 Presence of cardiac pacemaker: Secondary | ICD-10-CM | POA: Diagnosis not present

## 2020-12-15 LAB — ECHOCARDIOGRAM COMPLETE
AR max vel: 1.34 cm2
AV Area VTI: 1.38 cm2
AV Area mean vel: 1.28 cm2
AV Mean grad: 7 mmHg
AV Peak grad: 14.6 mmHg
Ao pk vel: 1.91 m/s
Area-P 1/2: 2.24 cm2
MV M vel: 5.71 m/s
MV Peak grad: 130.4 mmHg
P 1/2 time: 473 msec
S' Lateral: 2.5 cm

## 2020-12-15 LAB — CBC
HCT: 39.3 % (ref 36.0–46.0)
Hemoglobin: 13.4 g/dL (ref 12.0–15.0)
MCH: 31.2 pg (ref 26.0–34.0)
MCHC: 34.1 g/dL (ref 30.0–36.0)
MCV: 91.4 fL (ref 80.0–100.0)
Platelets: 159 10*3/uL (ref 150–400)
RBC: 4.3 MIL/uL (ref 3.87–5.11)
RDW: 13.5 % (ref 11.5–15.5)
WBC: 5.5 10*3/uL (ref 4.0–10.5)
nRBC: 0 % (ref 0.0–0.2)

## 2020-12-15 LAB — CREATININE, SERUM
Creatinine, Ser: 0.83 mg/dL (ref 0.44–1.00)
GFR, Estimated: >60 mL/min (ref >60)

## 2020-12-15 LAB — SURGICAL PCR SCREEN
MRSA, PCR: NEGATIVE
Staphylococcus aureus: NEGATIVE

## 2020-12-15 MED ORDER — ASPIRIN EC 81 MG PO TBEC: 81.0000 mg | DELAYED_RELEASE_TABLET | Freq: Every day | ORAL | Status: DC

## 2020-12-15 MED ORDER — CEFAZOLIN SODIUM-DEXTROSE 2-4 GM/100ML-% IV SOLN
2.0000 g | INTRAVENOUS | Status: AC
  Administered 2020-12-16: 2 g via INTRAVENOUS

## 2020-12-15 MED ORDER — ACETAMINOPHEN 325 MG PO TABS: 650.0000 mg | ORAL_TABLET | ORAL | Status: DC | PRN

## 2020-12-15 MED ORDER — MEMANTINE HCL 10 MG PO TABS
10.0000 mg | ORAL_TABLET | Freq: Two times a day (BID) | ORAL | Status: DC
Start: 2020-12-15 — End: 2020-12-17
  Administered 2020-12-15 – 2020-12-17 (×3): 10 mg via ORAL
  Filled 2020-12-15 (×4): qty 1

## 2020-12-15 MED ORDER — DM-GUAIFENESIN ER 30-600 MG PO TB12
1.0000 | ORAL_TABLET | Freq: Two times a day (BID) | ORAL | Status: DC
Start: 2020-12-15 — End: 2020-12-17
  Administered 2020-12-15 – 2020-12-17 (×3): 1 via ORAL
  Filled 2020-12-15 (×4): qty 1

## 2020-12-15 MED ORDER — PANTOPRAZOLE SODIUM 40 MG PO TBEC
40.0000 mg | DELAYED_RELEASE_TABLET | Freq: Every day | ORAL | Status: DC
Start: 2020-12-16 — End: 2020-12-17
  Administered 2020-12-16 – 2020-12-17 (×2): 40 mg via ORAL
  Filled 2020-12-15 (×2): qty 1

## 2020-12-15 MED ORDER — HEPARIN SODIUM (PORCINE) 5000 UNIT/ML IJ SOLN
5000.0000 [IU] | Freq: Three times a day (TID) | INTRAMUSCULAR | Status: DC
  Administered 2020-12-15 – 2020-12-16 (×2): 5000 [IU] via SUBCUTANEOUS
  Filled 2020-12-15 (×2): qty 1

## 2020-12-15 MED ORDER — NITROGLYCERIN 0.4 MG SL SUBL: 0.4000 mg | SUBLINGUAL_TABLET | SUBLINGUAL | Status: DC | PRN

## 2020-12-15 MED ORDER — SODIUM CHLORIDE 0.9 % IV SOLN: INTRAVENOUS | Status: DC

## 2020-12-15 MED ORDER — GENTAMICIN SULFATE 40 MG/ML IJ SOLN
80.0000 mg | INTRAMUSCULAR | Status: AC
  Administered 2020-12-16: 80 mg

## 2020-12-15 MED ORDER — CHLORHEXIDINE GLUCONATE 4 % EX LIQD
60.0000 mL | Freq: Once | CUTANEOUS | Status: AC
  Administered 2020-12-15: 4 via TOPICAL
  Filled 2020-12-15: qty 60

## 2020-12-15 MED ORDER — TIMOLOL MALEATE 0.5 % OP SOLN
1.0000 [drp] | Freq: Two times a day (BID) | OPHTHALMIC | Status: DC
Start: 2020-12-16 — End: 2020-12-17
  Administered 2020-12-16 – 2020-12-17 (×3): 1 [drp] via OPHTHALMIC
  Filled 2020-12-15: qty 5

## 2020-12-15 MED ORDER — LORATADINE 10 MG PO TABS: 10.0000 mg | ORAL_TABLET | Freq: Every day | ORAL | Status: DC | PRN

## 2020-12-15 MED ORDER — VITAMIN D 25 MCG (1000 UNIT) PO TABS
2000.0000 [IU] | ORAL_TABLET | Freq: Every day | ORAL | Status: DC
Start: 2020-12-16 — End: 2020-12-17
  Administered 2020-12-16 – 2020-12-17 (×2): 2000 [IU] via ORAL
  Filled 2020-12-15 (×2): qty 2

## 2020-12-15 MED ORDER — TRAZODONE HCL 100 MG PO TABS
100.0000 mg | ORAL_TABLET | Freq: Every day | ORAL | Status: DC
  Administered 2020-12-15 – 2020-12-16 (×2): 100 mg via ORAL
  Filled 2020-12-15 (×2): qty 1

## 2020-12-15 MED ORDER — IBUPROFEN 600 MG PO TABS: 600.0000 mg | ORAL_TABLET | Freq: Four times a day (QID) | ORAL | Status: DC | PRN

## 2020-12-15 MED ORDER — CHLORHEXIDINE GLUCONATE 4 % EX LIQD
60.0000 mL | Freq: Once | CUTANEOUS | Status: AC
  Administered 2020-12-16: 4 via TOPICAL
  Filled 2020-12-15: qty 60

## 2020-12-15 MED ORDER — SENNOSIDES-DOCUSATE SODIUM 8.6-50 MG PO TABS: 1.0000 | ORAL_TABLET | Freq: Every day | ORAL | Status: DC | PRN

## 2020-12-15 MED ORDER — ONDANSETRON HCL 4 MG/2ML IJ SOLN: 4.0000 mg | Freq: Four times a day (QID) | INTRAMUSCULAR | Status: DC | PRN

## 2020-12-15 NOTE — ED Notes (Signed)
Pt daughter assisted her to bedside toilet due to not being able  to wait for staff to assist. Pt had continent BM on toilet and assisted back to the bed by RN. Pt noted to have wet bed, linens changed, gown and purewick placed back and is functioning. Clean and dry at this time. Wearing all monitoring cords. Declines further needs. Daughter with patient at bedside.

## 2020-12-15 NOTE — Progress Notes (Signed)
    I discussed code status with the patient and her son today. This has been listed as "prior." Upon reviewing her prior status, she was noted to be a DNR. I was asking it she wanted to rescind this status for pacemaker implantation after thorough discussion of code status. She tells me, in front of her son, that she wants to be a full code. Her code status will be updated to reflect her wishes.

## 2020-12-15 NOTE — ED Notes (Signed)
Pt assisted to the bathroom for BM

## 2020-12-15 NOTE — H&P (Signed)
Cardiology Admission History and Physical:   Patient ID: Brianna Barnes MRN: [id number]; DOB: 1928-08-18   Admission date: 12/14/2020  PCP:  Yvonna Alanis, NP   [facility] Providers Cardiologist:  [name], MD    Chief Complaint:  Dizziness  Patient Profile:   Brianna Barnes is a 87 y.o. female with cryptogenic cirrhosis, hepatitis C, HTN, HLD, DDD, and GERD, who is being seen 12/15/2020 for the evaluation of Mobitz type II AV block.  History of Present Illness:   Brianna Barnes was seen by her PCP's office on 12/08/2020 with dizziness. EKG at that time demonstrated 2:1 AV block with narrow QRS escape. She was evaluated in our office on 12/12/2020 for the above. She was not on any AV nodal blocking medications. EKG in our office showed sinus rhythm, 73 bpm, 1:1 conduction. Labs showed a normal potassium. TSH from 05/2020 was normal. Echo and Zio patch were recommended. She presented to the [facility] ED on 12/14/2020 with recurrent dizziness with her heart rate dropping from the 70s bpm to the 30s bpm. No frank syncope. Telemetry demonstrated intermittent 2nd degree AV block without PR prolongation consistent with Mobitz type II AV block. BP remained stable in the ED. Labs showed normal HS-TN x 2. BNP 241. She was without symptoms of angina or volume overload. SHe was evaluated by EP with recommendation to transfer to [facility] for pacemaker implantation. Transfer has been delayed secondary to bed assignment. Currently, asymptomatic.    Past Medical History:  Diagnosis Date   Arthritis    Choledocholithiasis    Cirrhosis, cryptogenic (HCC)    FOLLOWED BY DR PYRTLE-- LOV  IN EPIC--  STABLE PER NOTE   Common bile duct stone    Cryptogenic cirrhosis (HCC)    DDD (degenerative disc disease), lumbar    Diverticulosis    Esophageal varix (HCC)    GERD (gastroesophageal reflux disease)    Hiatal hernia    History of colonoscopy with polypectomy    ADENOMATOUS AND HYPERPLASIA  POLYPS  (2008  &  2010)   History of hepatitis C    History of positive PPD    + TB SKIN TEST WITHOUT TB   Hyperlipidemia    Hypertension, portal (HCC)    SECONDARY TO CRYPTOGENIC CIRRHOSIS   Internal hemorrhoids    Iron deficiency anemia    Loss of hearing    right ear   Osteoarthritis    hands, knees, and low back   Osteoporosis    Portal hypertension (HCC)    PUD (peptic ulcer disease)    Squamous cell carcinoma of skin 04/07/2017   left cheek   Tubular adenoma of colon    Uterovaginal prolapse, incomplete    Wears glasses    Wears hearing aid    left ear only    Past Surgical History:  Procedure Laterality Date   APPENDECTOMY  1960'S   CHOLECYSTECTOMY  08/25/2011   Procedure: LAPAROSCOPIC CHOLECYSTECTOMY WITH INTRAOPERATIVE CHOLANGIOGRAM;  Surgeon: Zenovia Jarred, MD;  Location: [place];  Service: General;  Laterality: N/A;   ENDOSCOPIC RETROGRADE CHOLANGIOPANCREATOGRAPHY (ERCP) WITH PROPOFOL N/A 11/30/2012   Procedure: ENDOSCOPIC RETROGRADE CHOLANGIOPANCREATOGRAPHY (ERCP) WITH PROPOFOL;  Surgeon: Milus Banister, MD;  Location: [facility];  Service: Endoscopy;  Laterality: N/A;   ESOPHAGOGASTRODUODENOSCOPY N/A 05/18/2018   Procedure: ESOPHAGOGASTRODUODENOSCOPY (EGD);  Surgeon: Lin Landsman, MD;  Location: [facility] ENDOSCOPY;  Service: Gastroenterology;  Laterality: N/A;   EXTERNAL EAR SURGERY Left 1980   HYSTEROSCOPY WITH D & C  N/A 07/15/2014   Procedure: DILATATION AND CURETTAGE /HYSTEROSCOPY;  Surgeon: Molli Posey, MD;  Location: [facility];  Service: Gynecology;  Laterality: N/A;   INTRAMEDULLARY (IM) NAIL INTERTROCHANTERIC Left 01/22/2018   Procedure: ORIF LEFT INTERTROCHANTRIC HIP FX;  Surgeon: Mcarthur Rossetti, MD;  Location: [place];  Service: Orthopedics;  Laterality: Left;   KNEE ARTHROSCOPY W/ MENISCECTOMY Right 06-19-2002   LUMBAR DISC SURGERY  1990   REMOVAL OF URINARY SLING N/A 09/13/2013   Procedure: REMOVAL OF RETAINED PESSARY;   Surgeon: Jorja Loa, MD;  Location: [facility];  Service: Urology;  Laterality: N/A;     Medications Prior to Admission: Prior to Admission medications   Medication Sig Start Date End Date Taking? Authorizing Provider  Calcium-Phosphorus-Vitamin D (CALCIUM GUMMIES PO) Take 2 tablets by mouth daily.    [provider]  cetirizine (ZYRTEC) 10 MG tablet Take 10 mg by mouth as needed for allergies.    [provider]  Cholecalciferol (VITAMIN D3) 50 MCG (2000 UT) CAPS Take 1 capsule by mouth daily in the afternoon.    [provider]  denosumab (PROLIA) 60 MG/ML SOSY injection Inject 60 mg into the skin every 6 (six) months.    [provider]  dextromethorphan-guaiFENesin (MUCINEX DM) 30-600 MG 12hr tablet Take 1 tablet by mouth 2 (two) times daily.    [provider]  ibuprofen (ADVIL) 200 MG tablet Take 200 mg by mouth as needed.    [provider]  Iron Combinations (IRON COMPLEX PO) Take 1 tablet by mouth daily.    [provider]  memantine (NAMENDA) 10 MG tablet TAKE 1 TABLET BY MOUTH TWICE A DAY 02/22/20   Bedsole, [name] E, MD  ondansetron (ZOFRAN) 4 MG tablet Take 4 mg by mouth every 8 (eight) hours as needed for nausea or vomiting.    [provider]  pantoprazole (PROTONIX) 40 MG tablet TAKE 1 TABLET BY MOUTH DAILY BEFORE BREAKFAST 05/19/20   Fargo, [name] E, NP  sennosides-docusate sodium (SENOKOT-S) 8.6-50 MG tablet Take 1 tablet by mouth daily as needed for constipation.    [provider]  timolol (TIMOPTIC) 0.5 % ophthalmic solution Place 1 drop into both eyes 2 (two) times daily.  01/22/15   [provider]  traZODone (DESYREL) 100 MG tablet Take 1 tablet (100 mg total) by mouth at bedtime. 12/08/20   Fargo, [name] E, NP  vitamin E 400 UNIT capsule Take 400 Units by mouth daily.    [provider]     Allergies:   No Known Allergies  Social History:   Social History    Socioeconomic History   Marital status: Widowed    Spouse name: Not on file   Number of children: 3   Years of education: Not on file   Highest education level: Not on file  Occupational History   Occupation: Retired, Licensed conveyancer  Tobacco Use   Smoking status: Never   Smokeless tobacco: Never  Vaping Use   Vaping Use: Never used  Substance and Sexual Activity   Alcohol use: No   Drug use: No   Sexual activity: Not Currently  Other Topics Concern   Not on file  Social History Narrative       Social Determinants of Health   Financial Resource Strain: Not on file  Food Insecurity: Not on file  Transportation Needs: Not on file  Physical Activity: Not on file  Stress: Not on file  Social Connections: Not  on file  Intimate Partner Violence: Not on file    Family History:   The patient's family history includes Cirrhosis in her brother and sister; Diabetes in her brother and father; Heart disease in her brother and mother; Heart disease (age of onset: 63) in her father; Hyperlipidemia in her father; Pancreatic cancer in her brother. There is no history of Colon cancer, Colon polyps, Esophageal cancer, Stomach cancer, or Inflammatory bowel disease.    ROS:  Please see the history of present illness.  All other ROS reviewed and negative.     Physical Exam/Data:   Vitals:   12/15/20 0630 12/15/20 0700 12/15/20 0730 12/15/20 0800  BP: (!) 158/72 139/60 (!) 156/74 136/64  Pulse: 73 63 65 (!) 59  Resp: 17 13 (!) 21 15  Temp:      SpO2: 98% 93% 94% 95%   No intake or output data in the 24 hours ending 12/15/20 0900 Last 3 Weights 12/12/2020 12/08/2020 08/21/2020  Weight (lbs) 102 lb 107 lb 104 lb 12.8 oz  Weight (kg) 46.267 kg 48.535 kg 47.537 kg     There is no height or weight on file to calculate BMI.  General:  Elderly and frail appearing, in no acute distress HEENT: Normal Neck: No JVD Vascular: No carotid bruits; Distal pulses 2+ bilaterally   Cardiac:  Normal S1,  S2; RRR; I/VI systolic murmur  Lungs:  Clear to auscultation bilaterally, no wheezing, rhonchi or rales  Abd: Soft, nontender, no hepatomegaly  Ext: No edema Musculoskeletal:  No deformities, BUE and BLE strength normal and equal Skin: Warm and dry  Neuro:  CNs 2-12 intact, no focal abnormalities noted Psych:  Normal affect    EKG:  The ECG that was done 12/14/2020 was personally reviewed and demonstrates 2:1 AV block, 73 bpm, baseline artifact   Relevant CV Studies:  None available for review  Laboratory Data:  High Sensitivity Troponin:   Recent Labs  Lab 12/14/20 0928 12/14/20 1352  TROPONINIHS 8 7      Chemistry Recent Labs  Lab 12/08/20 1536 12/14/20 0928  NA 142 140  K 3.9 3.5  CL 106 103  CO2 30 28  GLUCOSE 83 94  BUN 26* 13  CREATININE 0.86 0.56  CALCIUM 9.5 8.2*  GFRNONAA  --  >60  ANIONGAP  --  9    Recent Labs  Lab 12/08/20 1536  PROT 5.6*  AST 35  ALT 21  BILITOT 1.1   Lipids No results for input(s): CHOL, TRIG, HDL, LABVLDL, LDLCALC, CHOLHDL in the last 168 hours. Hematology Recent Labs  Lab 12/08/20 1536 12/14/20 0928  WBC 5.3 4.2  RBC 3.71* 3.72*  HGB 11.5* 11.9*  HCT 34.6* 34.5*  MCV 93.3 92.7  MCH 31.0 32.0  MCHC 33.2 34.5  RDW 12.8 13.5  PLT 127* 138*   Thyroid No results for input(s): TSH, FREET4 in the last 168 hours. BNP Recent Labs  Lab 12/14/20 0928  BNP 241.8*    DDimer No results for input(s): DDIMER in the last 168 hours.   Radiology/Studies:  DG Chest 2 View  Result Date: 12/14/2020 IMPRESSION: No acute cardiopulmonary process. Electronically Signed   By: Lovey Newcomer M.D.   On: 12/14/2020 10:17     Assessment and Plan:   Mobitz type II AV block: Hemodynamically stable. Transfer to [facility] when a bed becomes available. Unable to transfer straight to the EP lab due to schedule and bed assignments. Continue to avoid AV nodal blocking medications.  Update TSH. EP team in [place] is aware.    HTN: Blood  pressure improved. PRN hydralazine for systolic BP > 779 mmHg. Will defer addition of amlodipine with improved BP at this time.   Mild dementia: PTA Namenda.  GERD: Protonix.     Risk Assessment/Risk Scores:     Severity of Illness: The appropriate patient status for this patient is OBSERVATION. Observation status is judged to be reasonable and necessary in order to provide the required intensity of service to ensure the patient's safety. The patient's presenting symptoms, physical exam findings, and initial radiographic and laboratory data in the context of their medical condition is felt to place them at decreased risk for further clinical deterioration. Furthermore, it is anticipated that the patient will be medically stable for discharge from the hospital within 2 midnights of admission. The following factors support the patient status of observation.   " The patient's presenting symptoms include dizziness. " The physical exam findings include intermittent bradycardia with associated Mobitz type II AV block. " The initial radiographic and laboratory data are Mobitz type II AV block.   For questions or updates, please contact [place] Please consult [email or web address] for contact info under     Signed, Christell Faith, PA-C  12/15/2020 9:00 AM

## 2020-12-15 NOTE — ED Notes (Signed)
Called and spoke to [facility] patient waiting for bed assignment, none listed

## 2020-12-15 NOTE — Progress Notes (Signed)
CCMD reported that pt had 2nd degree heart block with 37-39 heart rate while she was going through echocardiogram. Pt is asymptomatic. Notified [place], [place]

## 2020-12-15 NOTE — Progress Notes (Signed)
  Echocardiogram 2D Echocardiogram has been performed.  Brianna Barnes 12/15/2020, 4:21 PM

## 2020-12-15 NOTE — ED Notes (Signed)
Call bed placement  [facility]) concerning bed assignment . No assignment yet

## 2020-12-15 NOTE — H&P (Addendum)
Cardiology Admission History and Physical:   Patient ID: Brianna Barnes MRN: [id number]; DOB: 1929/01/30   Admission date: 12/14/2020  PCP:  Yvonna Alanis, NP   [facility] Providers Cardiologist:  [name], MD   Chief Complaint:  Dizziness  Patient Profile:   Brianna Barnes is a 87 y.o. female with cryptogenic cirrhosis, hepatitis C, HTN, HLD, DDD, and GERD who is being seen 12/15/2020 for the evaluation of Mobitz type II 2nd degree AV block.  History of Present Illness:   Brianna Barnes was seen by her PCP's office on 12/08/2020 with dizziness. EKG at that time demonstrated 2:1 AV block with narrow QRS escape. She was evaluated in our office on 12/12/2020 for the above. She was not on any oral AV nodal blocking medications. She does use timolol eye drops. EKG in our office showed sinus rhythm, 73 bpm, 1:1 conduction. Labs showed a normal potassium. TSH from 05/2020 was normal. Echo and Zio patch were recommended. She presented to the [facility] ED on 12/14/2020 with recurrent dizziness with her heart rate dropping from the 70s bpm to the 30s bpm. No frank syncope. Telemetry demonstrated intermittent 2nd degree AV block without PR prolongation consistent with Mobitz type II AV block. BP remained stable in the ED. Labs showed normal HS-TN x 2. BNP 241. She was without symptoms of angina or volume overload. She was evaluated by EP with recommendation to transfer to [facility] for pacemaker implantation. Transfer has been delayed secondary to bed assignment. Currently, asymptomatic.    Past Medical History:  Diagnosis Date   Arthritis    Choledocholithiasis    Cirrhosis, cryptogenic (HCC)    FOLLOWED BY DR PYRTLE-- LOV  IN EPIC--  STABLE PER NOTE   Common bile duct stone    Cryptogenic cirrhosis (HCC)    DDD (degenerative disc disease), lumbar    Diverticulosis    Esophageal varix (HCC)    GERD (gastroesophageal reflux disease)    Hiatal hernia    History of colonoscopy with  polypectomy    ADENOMATOUS AND HYPERPLASIA POLYPS  (2008  &  2010)   History of hepatitis C    History of positive PPD    + TB SKIN TEST WITHOUT TB   Hyperlipidemia    Hypertension, portal (HCC)    SECONDARY TO CRYPTOGENIC CIRRHOSIS   Internal hemorrhoids    Iron deficiency anemia    Loss of hearing    right ear   Osteoarthritis    hands, knees, and low back   Osteoporosis    Portal hypertension (HCC)    PUD (peptic ulcer disease)    Squamous cell carcinoma of skin 04/07/2017   left cheek   Tubular adenoma of colon    Uterovaginal prolapse, incomplete    Wears glasses    Wears hearing aid    left ear only    Past Surgical History:  Procedure Laterality Date   APPENDECTOMY  1960'S   CHOLECYSTECTOMY  08/25/2011   Procedure: LAPAROSCOPIC CHOLECYSTECTOMY WITH INTRAOPERATIVE CHOLANGIOGRAM;  Surgeon: Zenovia Jarred, MD;  Location: [place];  Service: General;  Laterality: N/A;   ENDOSCOPIC RETROGRADE CHOLANGIOPANCREATOGRAPHY (ERCP) WITH PROPOFOL N/A 11/30/2012   Procedure: ENDOSCOPIC RETROGRADE CHOLANGIOPANCREATOGRAPHY (ERCP) WITH PROPOFOL;  Surgeon: Milus Banister, MD;  Location: [facility];  Service: Endoscopy;  Laterality: N/A;   ESOPHAGOGASTRODUODENOSCOPY N/A 05/18/2018   Procedure: ESOPHAGOGASTRODUODENOSCOPY (EGD);  Surgeon: Lin Landsman, MD;  Location: [facility]COPY;  Service: Gastroenterology;  Laterality: N/A;   EXTERNAL EAR SURGERY Left  1980   HYSTEROSCOPY WITH D & C N/A 07/15/2014   Procedure: DILATATION AND CURETTAGE /HYSTEROSCOPY;  Surgeon: Molli Posey, MD;  Location: [place];  Service: Gynecology;  Laterality: N/A;   INTRAMEDULLARY (IM) NAIL INTERTROCHANTERIC Left 01/22/2018   Procedure: ORIF LEFT INTERTROCHANTRIC HIP FX;  Surgeon: Mcarthur Rossetti, MD;  Location: [place];  Service: Orthopedics;  Laterality: Left;   KNEE ARTHROSCOPY W/ MENISCECTOMY Right 06-19-2002   LUMBAR DISC SURGERY  1990   REMOVAL OF URINARY SLING N/A 09/13/2013    Procedure: REMOVAL OF RETAINED PESSARY;  Surgeon: Jorja Loa, MD;  Location: [facility];  Service: Urology;  Laterality: N/A;     Medications Prior to Admission: Prior to Admission medications   Medication Sig Start Date End Date Taking? Authorizing Provider  Calcium-Phosphorus-Vitamin D (CALCIUM GUMMIES PO) Take 2 tablets by mouth daily.    [provider]  cetirizine (ZYRTEC) 10 MG tablet Take 10 mg by mouth as needed for allergies.    [provider]  Cholecalciferol (VITAMIN D3) 50 MCG (2000 UT) CAPS Take 1 capsule by mouth daily in the afternoon.    [provider]  denosumab (PROLIA) 60 MG/ML SOSY injection Inject 60 mg into the skin every 6 (six) months.    [provider]  dextromethorphan-guaiFENesin (MUCINEX DM) 30-600 MG 12hr tablet Take 1 tablet by mouth 2 (two) times daily.    [provider]  ibuprofen (ADVIL) 200 MG tablet Take 200 mg by mouth as needed.    [provider]  Iron Combinations (IRON COMPLEX PO) Take 1 tablet by mouth daily.    [provider]  memantine (NAMENDA) 10 MG tablet TAKE 1 TABLET BY MOUTH TWICE A DAY 02/22/20   Bedsole, [name] E, MD  ondansetron (ZOFRAN) 4 MG tablet Take 4 mg by mouth every 8 (eight) hours as needed for nausea or vomiting.    [provider]  pantoprazole (PROTONIX) 40 MG tablet TAKE 1 TABLET BY MOUTH DAILY BEFORE BREAKFAST 05/19/20   Fargo, [name] E, NP  sennosides-docusate sodium (SENOKOT-S) 8.6-50 MG tablet Take 1 tablet by mouth daily as needed for constipation.    [provider]  timolol (TIMOPTIC) 0.5 % ophthalmic solution Place 1 drop into both eyes 2 (two) times daily.  01/22/15   [provider]  traZODone (DESYREL) 100 MG tablet Take 1 tablet (100 mg total) by mouth at bedtime. 12/08/20   Fargo, [name] E, NP  vitamin E 400 UNIT capsule Take 400 Units by mouth daily.    [provider]     Allergies:   No Known  Allergies  Social History:   Social History   Socioeconomic History   Marital status: Widowed    Spouse name: Not on file   Number of children: 3   Years of education: Not on file   Highest education level: Not on file  Occupational History   Occupation: Retired, Licensed conveyancer  Tobacco Use   Smoking status: Never   Smokeless tobacco: Never  Vaping Use   Vaping Use: Never used  Substance and Sexual Activity   Alcohol use: No   Drug use: No   Sexual activity: Not Currently  Other Topics Concern   Not on file  Social History Narrative       Social Determinants of Health   Financial Resource Strain: Not on file  Food Insecurity: Not on file  Transportation Needs: Not on file  Physical Activity: Not on file  Stress: Not on file  Social Connections: Not on file  Intimate Partner Violence: Not on file    Family History:   The patient's family history includes Cirrhosis in her brother and sister; Diabetes in her brother and father; Heart disease in her brother and mother; Heart disease (age of onset: 61) in her father; Hyperlipidemia in her father; Pancreatic cancer in her brother. There is no history of Colon cancer, Colon polyps, Esophageal cancer, Stomach cancer, or Inflammatory bowel disease.    ROS:  Please see the history of present illness.  All other ROS reviewed and negative.     Physical Exam/Data:   Vitals:    12/15/20 0630 12/15/20 0700 12/15/20 0730 12/15/20 0800  BP: (!) 158/72 139/60 (!) 156/74 136/64  Pulse: 73 63 65 (!) 59  Resp: 17 13 (!) 21 15  Temp:          SpO2: 98% 93% 94% 95%    No intake or output data in the 24 hours ending 12/15/20 0900 Last 3 Weights 12/12/2020 12/08/2020 08/21/2020  Weight (lbs) 102 lb 107 lb 104 lb 12.8 oz  Weight (kg) 46.267 kg 48.535 kg 47.537 kg     There is no height or weight on file to calculate BMI.  General:  Elderly and frail appearing, in no acute distress HEENT: Normal Neck: No JVD Vascular: No carotid bruits;  Distal pulses 2+ bilaterally   Cardiac:  Normal S1, S2; RRR; I/VI systolic murmur  Lungs:  Clear to auscultation bilaterally, no wheezing, rhonchi or rales  Abd: Soft, nontender, no hepatomegaly  Ext: No edema Musculoskeletal:  No deformities, BUE and BLE strength normal and equal Skin: Warm and dry  Neuro:  CNs 2-12 intact, no focal abnormalities noted Psych:  Normal affect      EKG:  The ECG that was done 12/14/2020 was personally reviewed and demonstrates 2:1 AV block, 73 bpm, baseline artifact  Relevant CV Studies:  None available for review  Laboratory Data:  High Sensitivity Troponin:   Recent Labs  Lab 12/14/20 0928 12/14/20 1352  TROPONINIHS 8 7      Chemistry Recent Labs  Lab 12/08/20 1536 12/14/20 0928  NA 142 140  K 3.9 3.5  CL 106 103  CO2 30 28  GLUCOSE 83 94  BUN 26* 13  CREATININE 0.86 0.56  CALCIUM 9.5 8.2*  GFRNONAA  --  >60  ANIONGAP  --  9    Recent Labs  Lab 12/08/20 1536  PROT 5.6*  AST 35  ALT 21  BILITOT 1.1   Lipids No results for input(s): CHOL, TRIG, HDL, LABVLDL, LDLCALC, CHOLHDL in the last 168 hours. Hematology Recent Labs  Lab 12/08/20 1536 12/14/20 0928  WBC 5.3 4.2  RBC 3.71* 3.72*  HGB 11.5* 11.9*  HCT 34.6* 34.5*  MCV 93.3 92.7  MCH 31.0 32.0  MCHC 33.2 34.5  RDW 12.8 13.5  PLT 127* 138*   Thyroid No results for input(s): TSH, FREET4 in the last 168 hours. BNP Recent Labs  Lab 12/14/20 0928  BNP 241.8*    DDimer No results for input(s): DDIMER in the last 168 hours.   Radiology/Studies:  DG Chest 2 View  Result Date: 12/14/2020 IMPRESSION: No acute cardiopulmonary process. Electronically Signed   By: Lovey Newcomer M.D.   On: 12/14/2020 10:17     Assessment and Plan:   Mobitz type II 2nd degree AV block: Hemodynamically stable. Transfer to [facility] was initially delayed due to bed assignment.  Case  has been discussed with Dr. Caryl Comes and team at [facility]. Check TSH.   Will proceed with Pacemaker at  next available time. Unfortunately, she was given lunch just prior to transfer here so will have to be tomorrow at the earliest. Due to high census potentially may be later in the week.   HTN: Blood pressure improved. PRN hydralazine for systolic BP > 299 mmHg. Will defer addition of amlodipine with improved BP at this time.   Mild dementia: PTA Namenda.  GERD: Protonix.    Risk Assessment/Risk Scores:    Severity of Illness: The appropriate patient status for this patient is OBSERVATION. Observation status is judged to be reasonable and necessary in order to provide the required intensity of service to ensure the patient's safety. The patient's presenting symptoms, physical exam findings, and initial radiographic and laboratory data in the context of their medical condition is felt to place them at decreased risk for further clinical deterioration. Furthermore, it is anticipated that the patient will be medically stable for discharge from the hospital within 2 midnights of admission. The following factors support the patient status of observation.   " The patient's presenting symptoms include dizziness. " The physical exam findings include  intermittent bradycardia with associated Mobitz type II AV block. " The initial radiographic and laboratory data are Mobitz type II AV block.   For questions or updates, please contact [place] Please consult [email or web address] for contact info under     Signed, Annamaria Helling  12/15/2020 3:31 PM

## 2020-12-15 NOTE — Plan of Care (Signed)
Pt arrived to unit without any issue. CCMD reported 2nd degree heart block with a rate of 37-39. Pt was asymptomatic. Heart rate went back up to 70's

## 2020-12-15 NOTE — ED Notes (Signed)
MD consulted with cardiology and confirmed pt can have food at this time due to cath not being today.

## 2020-12-16 ENCOUNTER — Encounter (HOSPITAL_COMMUNITY)
Admission: AD | Disposition: A | Discharge status: Home / Self Care | Payer: Self-pay | Source: Other Acute Inpatient Hospital | Attending: Internal Medicine

## 2020-12-16 ENCOUNTER — Other Ambulatory Visit: Payer: Self-pay

## 2020-12-16 DIAGNOSIS — B1920 Unspecified viral hepatitis C without hepatic coma: Secondary | ICD-10-CM | POA: Diagnosis present

## 2020-12-16 DIAGNOSIS — Z85828 Personal history of other malignant neoplasm of skin: Secondary | ICD-10-CM | POA: Diagnosis not present

## 2020-12-16 DIAGNOSIS — I441 Atrioventricular block, second degree: Secondary | ICD-10-CM | POA: Diagnosis not present

## 2020-12-16 DIAGNOSIS — D509 Iron deficiency anemia, unspecified: Secondary | ICD-10-CM | POA: Diagnosis present

## 2020-12-16 DIAGNOSIS — Z79899 Other long term (current) drug therapy: Secondary | ICD-10-CM | POA: Diagnosis not present

## 2020-12-16 DIAGNOSIS — Z950 Presence of cardiac pacemaker: Secondary | ICD-10-CM | POA: Diagnosis not present

## 2020-12-16 DIAGNOSIS — M810 Age-related osteoporosis without current pathological fracture: Secondary | ICD-10-CM | POA: Diagnosis present

## 2020-12-16 DIAGNOSIS — K7469 Other cirrhosis of liver: Secondary | ICD-10-CM | POA: Diagnosis present

## 2020-12-16 DIAGNOSIS — M1990 Unspecified osteoarthritis, unspecified site: Secondary | ICD-10-CM | POA: Diagnosis present

## 2020-12-16 DIAGNOSIS — I10 Essential (primary) hypertension: Secondary | ICD-10-CM | POA: Diagnosis not present

## 2020-12-16 DIAGNOSIS — F03A0 Unspecified dementia, mild, without behavioral disturbance, psychotic disturbance, mood disturbance, and anxiety: Secondary | ICD-10-CM | POA: Diagnosis present

## 2020-12-16 DIAGNOSIS — R001 Bradycardia, unspecified: Secondary | ICD-10-CM | POA: Diagnosis present

## 2020-12-16 DIAGNOSIS — K219 Gastro-esophageal reflux disease without esophagitis: Secondary | ICD-10-CM | POA: Diagnosis present

## 2020-12-16 DIAGNOSIS — R54 Age-related physical debility: Secondary | ICD-10-CM | POA: Diagnosis present

## 2020-12-16 DIAGNOSIS — E785 Hyperlipidemia, unspecified: Secondary | ICD-10-CM | POA: Diagnosis present

## 2020-12-16 DIAGNOSIS — Z20822 Contact with and (suspected) exposure to covid-19: Secondary | ICD-10-CM | POA: Diagnosis not present

## 2020-12-16 DIAGNOSIS — Z974 Presence of external hearing-aid: Secondary | ICD-10-CM | POA: Diagnosis not present

## 2020-12-16 DIAGNOSIS — Z8711 Personal history of peptic ulcer disease: Secondary | ICD-10-CM | POA: Diagnosis not present

## 2020-12-16 DIAGNOSIS — K766 Portal hypertension: Secondary | ICD-10-CM | POA: Diagnosis present

## 2020-12-16 DIAGNOSIS — H9191 Unspecified hearing loss, right ear: Secondary | ICD-10-CM | POA: Diagnosis present

## 2020-12-16 DIAGNOSIS — Z8249 Family history of ischemic heart disease and other diseases of the circulatory system: Secondary | ICD-10-CM | POA: Diagnosis not present

## 2020-12-16 HISTORY — PX: PACEMAKER IMPLANT: EP1218

## 2020-12-16 LAB — BASIC METABOLIC PANEL
Anion gap: 7 (ref 5–15)
BUN: 13 mg/dL (ref 8–23)
CO2: 26 mmol/L (ref 22–32)
Calcium: 7.8 mg/dL — ABNORMAL LOW (ref 8.9–10.3)
Chloride: 105 mmol/L (ref 98–111)
Creatinine, Ser: 0.87 mg/dL (ref 0.44–1.00)
GFR, Estimated: >60 mL/min (ref >60)
Glucose, Bld: 83 mg/dL (ref 70–99)
Potassium: 3.7 mmol/L (ref 3.5–5.1)
Sodium: 138 mmol/L (ref 135–145)

## 2020-12-16 LAB — TSH: TSH: 1.93 u[IU]/mL (ref 0.350–4.500)

## 2020-12-16 SURGERY — PACEMAKER IMPLANT

## 2020-12-16 MED ORDER — CEFAZOLIN SODIUM-DEXTROSE 2-4 GM/100ML-% IV SOLN
INTRAVENOUS | Status: AC
  Filled 2020-12-16: qty 100

## 2020-12-16 MED ORDER — LIDOCAINE HCL (PF) 1 % IJ SOLN
INTRAMUSCULAR | Status: DC | PRN
  Administered 2020-12-16: 45 mL

## 2020-12-16 MED ORDER — SODIUM CHLORIDE 0.9 % IV SOLN
INTRAVENOUS | Status: AC
  Filled 2020-12-16: qty 2

## 2020-12-16 MED ORDER — ACETAMINOPHEN 325 MG PO TABS: 325.0000 mg | ORAL_TABLET | ORAL | Status: DC | PRN

## 2020-12-16 MED ORDER — SODIUM CHLORIDE 0.9 % IV SOLN: 250.0000 mL | INTRAVENOUS | Status: DC | PRN

## 2020-12-16 MED ORDER — LIDOCAINE HCL (PF) 1 % IJ SOLN
INTRAMUSCULAR | Status: AC
  Filled 2020-12-16: qty 30

## 2020-12-16 MED ORDER — HEPARIN (PORCINE) IN NACL 1000-0.9 UT/500ML-% IV SOLN
INTRAVENOUS | Status: DC | PRN
  Administered 2020-12-16: 500 mL

## 2020-12-16 MED ORDER — HYDROCODONE-ACETAMINOPHEN 5-325 MG PO TABS
1.0000 | ORAL_TABLET | ORAL | Status: DC | PRN
  Administered 2020-12-16: 1 via ORAL
  Filled 2020-12-16: qty 1

## 2020-12-16 MED ORDER — SODIUM CHLORIDE 0.9% FLUSH
3.0000 mL | Freq: Two times a day (BID) | INTRAVENOUS | Status: DC
  Administered 2020-12-16 – 2020-12-17 (×2): 3 mL via INTRAVENOUS

## 2020-12-16 MED ORDER — MIDAZOLAM HCL 5 MG/5ML IJ SOLN
INTRAMUSCULAR | Status: DC | PRN
  Administered 2020-12-16: 1 mg via INTRAVENOUS

## 2020-12-16 MED ORDER — SODIUM CHLORIDE 0.9% FLUSH: 3.0000 mL | INTRAVENOUS | Status: DC | PRN

## 2020-12-16 MED ORDER — CEFAZOLIN SODIUM-DEXTROSE 1-4 GM/50ML-% IV SOLN
1.0000 g | Freq: Four times a day (QID) | INTRAVENOUS | Status: AC
Start: 2020-12-16 — End: 2020-12-17
  Administered 2020-12-16 – 2020-12-17 (×3): 1 g via INTRAVENOUS
  Filled 2020-12-16 (×4): qty 50

## 2020-12-16 MED ORDER — MIDAZOLAM HCL 5 MG/5ML IJ SOLN
INTRAMUSCULAR | Status: AC
  Filled 2020-12-16: qty 5

## 2020-12-16 MED ORDER — ONDANSETRON HCL 4 MG/2ML IJ SOLN
4.0000 mg | Freq: Four times a day (QID) | INTRAMUSCULAR | Status: DC | PRN
Start: 2020-12-16 — End: 2020-12-17

## 2020-12-16 SURGICAL SUPPLY — 9 items
CABLE SURGICAL S-101-97-12 (CABLE) ×3 IMPLANT
KIT MICROPUNCTURE NIT STIFF (SHEATH) ×1 IMPLANT
LEAD TENDRIL MRI 46CM LPA1200M (Lead) ×1 IMPLANT
LEAD TENDRIL MRI 52CM LPA1200M (Lead) ×1 IMPLANT
PACEMAKER ASSURITY DR-RF (Pacemaker) ×1 IMPLANT
PAD PRO RADIOLUCENT 2001M-C (PAD) ×3 IMPLANT
SHEATH 8FR PRELUDE SNAP 13 (SHEATH) ×2 IMPLANT
SHEATH PROBE COVER 6X72 (BAG) ×1 IMPLANT
TRAY PACEMAKER INSERTION (PACKS) ×3 IMPLANT

## 2020-12-16 NOTE — Progress Notes (Addendum)
Electrophysiology Rounding Note  Patient Name: Alvira Hecht Date of Encounter: 12/16/2020  Primary Cardiologist: [name], MD Electrophysiologist: New    Subjective   [place]. Continues to go in and out of mobitz II. Pleasantly demented confounded by being hard of hearing as well. At this time, the patient denies chest pain, shortness of breath, or any new concerns.  Inpatient Medications    Scheduled Meds:  aspirin EC  81 mg Oral Daily   cholecalciferol  2,000 Units Oral Daily   dextromethorphan-guaiFENesin  1 tablet Oral BID   gentamicin irrigation  80 mg Irrigation On Call   heparin  5,000 Units Subcutaneous Q8H   memantine  10 mg Oral BID   pantoprazole  40 mg Oral Daily   timolol  1 drop Both Eyes BID   traZODone  100 mg Oral QHS   Continuous Infusions:  sodium chloride     sodium chloride      ceFAZolin (ANCEF) IV     PRN Meds: acetaminophen, ibuprofen, loratadine, nitroGLYCERIN, ondansetron (ZOFRAN) IV, senna-docusate   Vital Signs    Vitals:   12/15/20 2019 12/16/20 0030 12/16/20 0500 12/16/20 0722  BP: 124/69 (!) 107/35 (!) 113/47 (!) 136/48  Pulse: 79 (!) 40 (!) 41 (!) 40  Resp: 18 18 18 16   Temp: 98.6 F (37 C) 98.6 F (37 C) 98.5 F (36.9 C) 98.9 F (37.2 C)  TempSrc: Oral Oral Oral Oral  SpO2: 96% 97% 98% 96%  Weight:   43.9 kg     Intake/Output Summary (Last 24 hours) at 12/16/2020 3825 Last data filed at 12/16/2020 0825 Gross per 24 hour  Intake 120 ml  Output 50 ml  Net 70 ml   Filed Weights   12/16/20 0500  Weight: 43.9 kg    Physical Exam    GEN- The patient is elderly and frail appearing, alert   Head- normocephalic, atraumatic Eyes-  Sclera clear, conjunctiva pink Ears- hearing reduced Oropharynx- clear Neck- supple Lungs- Clear to ausculation bilaterally, normal work of breathing Heart- Regular rate and rhythm, no murmurs, rubs or gallops GI- soft, NT, ND, + BS Extremities- no clubbing or cyanosis. No  edema Skin- no rash or lesion Psych- euthymic mood, full affect Neuro- strength and sensation are intact  Labs    CBC Recent Labs    12/14/20 0928 12/15/20 1636  WBC 4.2 5.5  HGB 11.9* 13.4  HCT 34.5* 39.3  MCV 92.7 91.4  PLT 138* 053   Basic Metabolic Panel Recent Labs    12/14/20 0928 12/15/20 1636 12/16/20 0301  NA 140  --  138  K 3.5  --  3.7  CL 103  --  105  CO2 28  --  26  GLUCOSE 94  --  83  BUN 13  --  13  CREATININE 0.56 0.83 0.87  CALCIUM 8.2*  --  7.8*   Liver Function Tests No results for input(s): AST, ALT, ALKPHOS, BILITOT, PROT, ALBUMIN in the last 72 hours. No results for input(s): LIPASE, AMYLASE in the last 72 hours. Cardiac Enzymes No results for input(s): CKTOTAL, CKMB, CKMBINDEX, TROPONINI in the last 72 hours.   Telemetry    NSR 60-70s with intermittent 2:1 AV block in upper 30s/40s (personally reviewed)  Radiology    DG Chest 2 View  Result Date: 12/14/2020 CLINICAL DATA:  Shortness of breath. EXAM: CHEST - 2 VIEW COMPARISON:  Chest radiograph 01/21/2018 FINDINGS: Stable enlarged cardiac and mediastinal contours. No large area pulmonary consolidation. No  pleural effusion or pneumothorax. Cholecystectomy clips. IMPRESSION: No acute cardiopulmonary process. Electronically Signed   By: Lovey Newcomer M.D.   On: 12/14/2020 10:17   ECHOCARDIOGRAM COMPLETE  Result Date: 12/15/2020    ECHOCARDIOGRAM REPORT   Patient Name:   KATERI BALCH Hage Date of Exam: 12/15/2020 Medical Rec #:  [id number]            Height:       58.0 in Accession #:    [id number]           Weight:       102.0 lb Date of Birth:  11-10-1928           BSA:          1.368 m Patient Age:    70 years             BP:           136/60 mmHg Patient Gender: F                    HR:           40 bpm. Exam Location:  Inpatient Procedure: 2D Echo STAT ECHO Indications:    heart block  History:        Patient has no prior history of Echocardiogram examinations.                 Risk  Factors:Hypertension and Dyslipidemia.  Sonographer:    Johny Chess RDCS Referring Phys: [id number] [place]  1. Left ventricular ejection fraction, by estimation, is 60 to 65%. The left ventricle has normal function. The left ventricle has no regional wall motion abnormalities. Left ventricular diastolic parameters are consistent with Grade I diastolic dysfunction (impaired relaxation). Elevated left atrial pressure.  2. Right ventricular systolic function is normal. The right ventricular size is normal. There is mildly elevated pulmonary artery systolic pressure.  3. Left atrial size was severely dilated.  4. The mitral valve is normal in structure. Mild mitral valve regurgitation. No evidence of mitral stenosis. Severe mitral annular calcification.  5. The aortic valve is tricuspid. Aortic valve regurgitation is mild. Mild to moderate aortic valve sclerosis/calcification is present, without any evidence of aortic stenosis.  6. The inferior vena cava is normal in size with greater than 50% respiratory variability, suggesting right atrial pressure of 3 mmHg. Comparison(s): No prior Echocardiogram. FINDINGS  Left Ventricle: Left ventricular ejection fraction, by estimation, is 60 to 65%. The left ventricle has normal function. The left ventricle has no regional wall motion abnormalities. The left ventricular internal cavity size was normal in size. There is  no left ventricular hypertrophy. Left ventricular diastolic parameters are consistent with Grade I diastolic dysfunction (impaired relaxation). Elevated left atrial pressure. Right Ventricle: The right ventricular size is normal. Right ventricular systolic function is normal. There is mildly elevated pulmonary artery systolic pressure. The tricuspid regurgitant velocity is 3.21 m/s, and with an assumed right atrial pressure of 3 mmHg, the estimated right ventricular systolic pressure is 16.0 mmHg. Left Atrium: Left atrial size was  severely dilated. Right Atrium: Right atrial size was normal in size. Pericardium: There is no evidence of pericardial effusion. Mitral Valve: The mitral valve is normal in structure. Severe mitral annular calcification. Mild mitral valve regurgitation. No evidence of mitral valve stenosis. Tricuspid Valve: The tricuspid valve is normal in structure. Tricuspid valve regurgitation is mild . No evidence of tricuspid stenosis. Aortic Valve: The aortic valve is tricuspid. Aortic  valve regurgitation is mild. Aortic regurgitation PHT measures 473 msec. Mild to moderate aortic valve sclerosis/calcification is present, without any evidence of aortic stenosis. Aortic valve mean gradient measures 7.0 mmHg. Aortic valve peak gradient measures 14.6 mmHg. Aortic valve area, by VTI measures 1.38 cm. Pulmonic Valve: The pulmonic valve was normal in structure. Pulmonic valve regurgitation is trivial. No evidence of pulmonic stenosis. Aorta: The aortic root is normal in size and structure. Venous: The inferior vena cava is normal in size with greater than 50% respiratory variability, suggesting right atrial pressure of 3 mmHg. IAS/Shunts: No atrial level shunt detected by color flow Doppler.  LEFT VENTRICLE PLAX 2D LVIDd:         4.10 cm  Diastology LVIDs:         2.50 cm  LV e' medial:    5.55 cm/s LV PW:         0.90 cm  LV E/e' medial:  17.4 LV IVS:        0.70 cm  LV e' lateral:   8.38 cm/s LVOT diam:     1.60 cm  LV E/e' lateral: 11.5 LV SV:         74 LV SV Index:   54 LVOT Area:     2.01 cm  RIGHT VENTRICLE             IVC RV S prime:     15.70 cm/s  IVC diam: 1.40 cm TAPSE (M-mode): 2.6 cm LEFT ATRIUM           Index       RIGHT ATRIUM          Index LA diam:      3.60 cm 2.63 cm/m  RA Area:     8.38 cm LA Vol (A4C): 70.9 ml 51.83 ml/m RA Volume:   15.10 ml 11.04 ml/m  AORTIC VALVE AV Area (Vmax):    1.34 cm AV Area (Vmean):   1.28 cm AV Area (VTI):     1.38 cm AV Vmax:           191.00 cm/s AV Vmean:           122.000 cm/s AV VTI:            0.535 m AV Peak Grad:      14.6 mmHg AV Mean Grad:      7.0 mmHg LVOT Vmax:         127.00 cm/s LVOT Vmean:        77.700 cm/s LVOT VTI:          0.367 m LVOT/AV VTI ratio: 0.69 AI PHT:            473 msec  AORTA Ao Root diam: 2.70 cm Ao Asc diam:  3.00 cm MITRAL VALVE                TRICUSPID VALVE MV Area (PHT): 2.24 cm     TR Peak grad:   41.2 mmHg MV Decel Time: 338 msec     TR Vmax:        321.00 cm/s MR Peak grad: 130.4 mmHg MR Mean grad: 86.0 mmHg     SHUNTS MR Vmax:      571.00 cm/s   Systemic VTI:  0.37 m MR Vmean:     447.0 cm/s    Systemic Diam: 1.60 cm MV E velocity: 96.70 cm/s MV A velocity: 123.00 cm/s MV E/A ratio:  0.79 Kirk Ruths MD Electronically signed by  Kirk Ruths MD Signature Date/Time: 12/15/2020/4:41:15 PM    Final     Patient Profile     Taisa Lillah Standre is a 87 y.o. female with a past medical history significant for cirrhosis, HCV, HTN, HLD, DDD, and GERD.  she was admitted for symptomatic bradycardia and transferred to [facility] for pacemaker consideration.   Assessment & Plan    Mobitz type II 2nd degree AV block:  Symptomatic with dizzy spells and near syncope Has been hemodynamically stable here Explained risks, benefits, and alternatives to PPM implantation, including but not limited to bleeding, infection, pneumothorax, pericardial effusion, lead dislodgement, heart attack, stroke, or death.  Will explain again to POA on her arrival.    HTN: Follow closely post pacer.    Mild dementia: Daughter is POA.   Plan for pacing later today. Will review risks and benefits and obtain consent on daughters arrival.   ADDENDUM Explained risks, benefits, and alternatives to PPM implantation, including but not limited to bleeding, infection, pneumothorax, pericardial effusion, lead dislodgement, heart attack, stroke, or death.  Pt and POA verbalized understanding and agree to proceed   For questions or updates, please contact [facility] Please consult [email or web address] for contact info under Cardiology/STEMI.  Signed, Shirley Friar, PA-C  12/16/2020, 8:33 AM    I have seen, examined the patient, and reviewed the above assessment and plan.  Changes to above are made where necessary.  On exam, RRR.  She is elderly and frail.  Confused but pleasant.    She presents with symptomatic second degree AV block. I would therefore recommend pacemaker implantation at this time as per guidelines.  Risks, benefits, alternatives to pacemaker implantation were discussed in detail with the patient, her daughter Henrietta Dine) and son in law today. The patient and her family understand that the risks include but are not limited to bleeding, infection, pneumothorax, perforation, tamponade, vascular damage, renal failure, MI, stroke, death,  and lead dislodgement and wishes to proceed. Given the patients very thin body habitus, fragility, and advanced age, she is aware that her risks of this procedure are quite high.  We will proceed with caution.   Co Sign: Thompson Grayer, MD 12/16/2020 2:41 PM

## 2020-12-16 NOTE — Interval H&P Note (Signed)
History and Physical Interval Note:  12/16/2020 2:45 PM  Brianna Barnes  has presented today for surgery, with the diagnosis of Mobitz 2 heart block.  The various methods of treatment have been discussed with the patient and family. After consideration of risks, benefits and other options for treatment, the patient has consented to  Procedure(s): PACEMAKER IMPLANT (N/A) as a surgical intervention.  The patient's history has been reviewed, patient examined, no change in status, stable for surgery.  I have reviewed the patient's chart and labs.  Questions were answered to the patient's satisfaction.     Thompson Grayer

## 2020-12-16 NOTE — Discharge Summary (Signed)
Discharge Summary    Patient ID: Brianna Barnes  MRN: [id number], DOB/AGE: [birth year] 87 y.o.  Admit Date: 12/14/2020 Discharge Date: 12/16/2020  Primary Care Provider: Yvonna Alanis, NP Primary Cardiologist: Dr. Garen Lah, MD  Discharge Diagnoses    Active Problems:   Mobitz type 2 second degree atrioventricular block   Allergies No Known Allergies   History of Present Illness     Brianna Barnes is a 87 y.o. female with cryptogenic cirrhosis, hepatitis C, HTN, HLD, DDD, and GERD, who was admitted on 12/14/2020 for the evaluation of Mobitz type II AV block.  Hospital Course     Consultants: None   Brianna Barnes was seen by her PCP's office on 12/08/2020 with dizziness. EKG at that time demonstrated 2:1 AV block with narrow QRS escape. She was evaluated in our office on 12/12/2020 for the above. She was not on any AV nodal blocking medications. EKG in our office showed sinus rhythm, 73 bpm, 1:1 conduction. Labs showed a normal potassium. TSH from 05/2020 was normal. Echo and Zio patch were recommended. She presented to the [facility] ED on 12/14/2020 with recurrent dizziness with her heart rate dropping from the 70s bpm to the 30s bpm. No frank syncope. Telemetry demonstrated intermittent 2nd degree AV block without PR prolongation consistent with Mobitz type II AV block. BP remained stable in the ED. Labs showed normal HS-TN x 2. BNP 241. She was without symptoms of angina or volume overload. She was evaluated by EP with recommendation to transfer to [facility] for pacemaker implantation. Transfer has been delayed secondary to bed assignment. While at [facility], she remained asymptomatic.  She was ultimately transferred to [facility] for further evaluation and management by EP on the afternoon of 12/15/2020 without event.   _____________  Discharge Vitals Blood pressure (!) 162/61, pulse 71, temperature 98.4 F (36.9 C), temperature source Oral, resp. rate 19, SpO2 98 %.  There were no  vitals filed for this visit.  Labs & Radiologic Studies    CBC Recent Labs    12/14/20 0928 12/15/20 1636  WBC 4.2 5.5  HGB 11.9* 13.4  HCT 34.5* 39.3  MCV 92.7 91.4  PLT 138* 891   Basic Metabolic Panel Recent Labs    12/14/20 0928 12/15/20 1636 12/16/20 0301  NA 140  --  138  K 3.5  --  3.7  CL 103  --  105  CO2 28  --  26  GLUCOSE 94  --  83  BUN 13  --  13  CREATININE 0.56 0.83 0.87  CALCIUM 8.2*  --  7.8*   Liver Function Tests No results for input(s): AST, ALT, ALKPHOS, BILITOT, PROT, ALBUMIN in the last 72 hours. No results for input(s): LIPASE, AMYLASE in the last 72 hours. Cardiac Enzymes No results for input(s): CKTOTAL, CKMB, CKMBINDEX, TROPONINI in the last 72 hours. BNP Invalid input(s): POCBNP D-Dimer No results for input(s): DDIMER in the last 72 hours. Hemoglobin A1C No results for input(s): HGBA1C in the last 72 hours. Fasting Lipid Panel No results for input(s): CHOL, HDL, LDLCALC, TRIG, CHOLHDL, LDLDIRECT in the last 72 hours. Thyroid Function Tests Recent Labs    12/16/20 0301  TSH 1.930   _____________  DG Chest 2 View  Result Date: 12/14/2020 CLINICAL DATA:  Shortness of breath. EXAM: CHEST - 2 VIEW COMPARISON:  Chest radiograph 01/21/2018 FINDINGS: Stable enlarged cardiac and mediastinal contours. No large area pulmonary consolidation. No pleural effusion or pneumothorax. Cholecystectomy clips. IMPRESSION: No  acute cardiopulmonary process. Electronically Signed   By: Lovey Newcomer M.D.   On: 12/14/2020 10:17   ECHOCARDIOGRAM COMPLETE  Result Date: 12/15/2020    ECHOCARDIOGRAM REPORT   Patient Name:   Brianna Barnes Date of Exam: 12/15/2020 Medical Rec #:  [id number]            Height:       58.0 in Accession #:    [id number]           Weight:       102.0 lb Date of Birth:  1928/11/18           BSA:          1.368 m Patient Age:    88 years             BP:           136/60 mmHg Patient Gender: F                    HR:           40 bpm.  Exam Location:  Inpatient Procedure: 2D Echo STAT ECHO Indications:    heart block  History:        Patient has no prior history of Echocardiogram examinations.                 Risk Factors:Hypertension and Dyslipidemia.  Sonographer:    Johny Chess RDCS Referring Phys: [id number] [place]  1. Left ventricular ejection fraction, by estimation, is 60 to 65%. The left ventricle has normal function. The left ventricle has no regional wall motion abnormalities. Left ventricular diastolic parameters are consistent with Grade I diastolic dysfunction (impaired relaxation). Elevated left atrial pressure.  2. Right ventricular systolic function is normal. The right ventricular size is normal. There is mildly elevated pulmonary artery systolic pressure.  3. Left atrial size was severely dilated.  4. The mitral valve is normal in structure. Mild mitral valve regurgitation. No evidence of mitral stenosis. Severe mitral annular calcification.  5. The aortic valve is tricuspid. Aortic valve regurgitation is mild. Mild to moderate aortic valve sclerosis/calcification is present, without any evidence of aortic stenosis.  6. The inferior vena cava is normal in size with greater than 50% respiratory variability, suggesting right atrial pressure of 3 mmHg. Comparison(s): No prior Echocardiogram. FINDINGS  Left Ventricle: Left ventricular ejection fraction, by estimation, is 60 to 65%. The left ventricle has normal function. The left ventricle has no regional wall motion abnormalities. The left ventricular internal cavity size was normal in size. There is  no left ventricular hypertrophy. Left ventricular diastolic parameters are consistent with Grade I diastolic dysfunction (impaired relaxation). Elevated left atrial pressure. Right Ventricle: The right ventricular size is normal. Right ventricular systolic function is normal. There is mildly elevated pulmonary artery systolic pressure. The tricuspid  regurgitant velocity is 3.21 m/s, and with an assumed right atrial pressure of 3 mmHg, the estimated right ventricular systolic pressure is 74.2 mmHg. Left Atrium: Left atrial size was severely dilated. Right Atrium: Right atrial size was normal in size. Pericardium: There is no evidence of pericardial effusion. Mitral Valve: The mitral valve is normal in structure. Severe mitral annular calcification. Mild mitral valve regurgitation. No evidence of mitral valve stenosis. Tricuspid Valve: The tricuspid valve is normal in structure. Tricuspid valve regurgitation is mild . No evidence of tricuspid stenosis. Aortic Valve: The aortic valve is tricuspid. Aortic valve regurgitation is mild. Aortic regurgitation PHT measures  473 msec. Mild to moderate aortic valve sclerosis/calcification is present, without any evidence of aortic stenosis. Aortic valve mean gradient measures 7.0 mmHg. Aortic valve peak gradient measures 14.6 mmHg. Aortic valve area, by VTI measures 1.38 cm. Pulmonic Valve: The pulmonic valve was normal in structure. Pulmonic valve regurgitation is trivial. No evidence of pulmonic stenosis. Aorta: The aortic root is normal in size and structure. Venous: The inferior vena cava is normal in size with greater than 50% respiratory variability, suggesting right atrial pressure of 3 mmHg. IAS/Shunts: No atrial level shunt detected by color flow Doppler.  LEFT VENTRICLE PLAX 2D LVIDd:         4.10 cm  Diastology LVIDs:         2.50 cm  LV e' medial:    5.55 cm/s LV PW:         0.90 cm  LV E/e' medial:  17.4 LV IVS:        0.70 cm  LV e' lateral:   8.38 cm/s LVOT diam:     1.60 cm  LV E/e' lateral: 11.5 LV SV:         74 LV SV Index:   54 LVOT Area:     2.01 cm  RIGHT VENTRICLE             IVC RV S prime:     15.70 cm/s  IVC diam: 1.40 cm TAPSE (M-mode): 2.6 cm LEFT ATRIUM           Index       RIGHT ATRIUM          Index LA diam:      3.60 cm 2.63 cm/m  RA Area:     8.38 cm LA Vol (A4C): 70.9 ml 51.83 ml/m RA  Volume:   15.10 ml 11.04 ml/m  AORTIC VALVE AV Area (Vmax):    1.34 cm AV Area (Vmean):   1.28 cm AV Area (VTI):     1.38 cm AV Vmax:           191.00 cm/s AV Vmean:          122.000 cm/s AV VTI:            0.535 m AV Peak Grad:      14.6 mmHg AV Mean Grad:      7.0 mmHg LVOT Vmax:         127.00 cm/s LVOT Vmean:        77.700 cm/s LVOT VTI:          0.367 m LVOT/AV VTI ratio: 0.69 AI PHT:            473 msec  AORTA Ao Root diam: 2.70 cm Ao Asc diam:  3.00 cm MITRAL VALVE                TRICUSPID VALVE MV Area (PHT): 2.24 cm     TR Peak grad:   41.2 mmHg MV Decel Time: 338 msec     TR Vmax:        321.00 cm/s MR Peak grad: 130.4 mmHg MR Mean grad: 86.0 mmHg     SHUNTS MR Vmax:      571.00 cm/s   Systemic VTI:  0.37 m MR Vmean:     447.0 cm/s    Systemic Diam: 1.60 cm MV E velocity: 96.70 cm/s MV A velocity: 123.00 cm/s MV E/A ratio:  0.79 Kirk Ruths MD Electronically signed by Kirk Ruths MD Signature Date/Time: 12/15/2020/4:41:15 PM  Final     Diagnostic Studies/Procedures   As above _____________  Disposition   Pt is being discharged home today in good condition.  Follow-up Plans & Appointments    To be determined following [facility] admission   Discharge Medications   Allergies as of 12/15/2020   No Known Allergies      Medication List     ASK your doctor about these medications    CALCIUM GUMMIES PO Take 2 tablets by mouth daily.   cetirizine 10 MG tablet Commonly known as: ZYRTEC Take 10 mg by mouth daily as needed for allergies.   denosumab 60 MG/ML Sosy injection Commonly known as: PROLIA Inject 60 mg into the skin every 6 (six) months.   memantine 10 MG tablet Commonly known as: NAMENDA TAKE 1 TABLET BY MOUTH TWICE A DAY   pantoprazole 40 MG tablet Commonly known as: PROTONIX TAKE 1 TABLET BY MOUTH DAILY BEFORE BREAKFAST   sennosides-docusate sodium 8.6-50 MG tablet Commonly known as: SENOKOT-S Take 1 tablet by mouth daily as needed for  constipation.   timolol 0.5 % ophthalmic solution Commonly known as: TIMOPTIC Place 1 drop into both eyes 2 (two) times daily.   traZODone 100 MG tablet Commonly known as: DESYREL Take 1 tablet (100 mg total) by mouth at bedtime.   vitamin D3 50 MCG (2000 UT) Caps Take 1 capsule by mouth daily in the afternoon.   vitamin E 180 MG (400 UNITS) capsule Take 400 Units by mouth daily.             Outstanding Labs/Studies   None  Duration of Discharge Encounter   Greater than 30 minutes including physician time.  Signed, Rise Mu, PA-C [place] Pager: [phone] 12/16/2020, 11:51 AM

## 2020-12-16 NOTE — H&P (View-Only) (Signed)
Electrophysiology Rounding Note  Patient Name: Brianna Barnes Date of Encounter: 12/16/2020  Primary Cardiologist: [name], MD Electrophysiologist: New    Subjective   [place]. Continues to go in and out of mobitz II. Pleasantly demented confounded by being hard of hearing as well. At this time, the patient denies chest pain, shortness of breath, or any new concerns.  Inpatient Medications    Scheduled Meds:  aspirin EC  81 mg Oral Daily   cholecalciferol  2,000 Units Oral Daily   dextromethorphan-guaiFENesin  1 tablet Oral BID   gentamicin irrigation  80 mg Irrigation On Call   heparin  5,000 Units Subcutaneous Q8H   memantine  10 mg Oral BID   pantoprazole  40 mg Oral Daily   timolol  1 drop Both Eyes BID   traZODone  100 mg Oral QHS   Continuous Infusions:  sodium chloride     sodium chloride      ceFAZolin (ANCEF) IV     PRN Meds: acetaminophen, ibuprofen, loratadine, nitroGLYCERIN, ondansetron (ZOFRAN) IV, senna-docusate   Vital Signs    Vitals:   12/15/20 2019 12/16/20 0030 12/16/20 0500 12/16/20 0722  BP: 124/69 (!) 107/35 (!) 113/47 (!) 136/48  Pulse: 79 (!) 40 (!) 41 (!) 40  Resp: 18 18 18 16   Temp: 98.6 F (37 C) 98.6 F (37 C) 98.5 F (36.9 C) 98.9 F (37.2 C)  TempSrc: Oral Oral Oral Oral  SpO2: 96% 97% 98% 96%  Weight:   43.9 kg     Intake/Output Summary (Last 24 hours) at 12/16/2020 7353 Last data filed at 12/16/2020 0825 Gross per 24 hour  Intake 120 ml  Output 50 ml  Net 70 ml   Filed Weights   12/16/20 0500  Weight: 43.9 kg    Physical Exam    GEN- The patient is elderly and frail appearing, alert   Head- normocephalic, atraumatic Eyes-  Sclera clear, conjunctiva pink Ears- hearing reduced Oropharynx- clear Neck- supple Lungs- Clear to ausculation bilaterally, normal work of breathing Heart- Regular rate and rhythm, no murmurs, rubs or gallops GI- soft, NT, ND, + BS Extremities- no clubbing or cyanosis. No  edema Skin- no rash or lesion Psych- euthymic mood, full affect Neuro- strength and sensation are intact  Labs    CBC Recent Labs    12/14/20 0928 12/15/20 1636  WBC 4.2 5.5  HGB 11.9* 13.4  HCT 34.5* 39.3  MCV 92.7 91.4  PLT 138* 299   Basic Metabolic Panel Recent Labs    12/14/20 0928 12/15/20 1636 12/16/20 0301  NA 140  --  138  K 3.5  --  3.7  CL 103  --  105  CO2 28  --  26  GLUCOSE 94  --  83  BUN 13  --  13  CREATININE 0.56 0.83 0.87  CALCIUM 8.2*  --  7.8*   Liver Function Tests No results for input(s): AST, ALT, ALKPHOS, BILITOT, PROT, ALBUMIN in the last 72 hours. No results for input(s): LIPASE, AMYLASE in the last 72 hours. Cardiac Enzymes No results for input(s): CKTOTAL, CKMB, CKMBINDEX, TROPONINI in the last 72 hours.   Telemetry    NSR 60-70s with intermittent 2:1 AV block in upper 30s/40s (personally reviewed)  Radiology    DG Chest 2 View  Result Date: 12/14/2020 CLINICAL DATA:  Shortness of breath. EXAM: CHEST - 2 VIEW COMPARISON:  Chest radiograph 01/21/2018 FINDINGS: Stable enlarged cardiac and mediastinal contours. No large area pulmonary consolidation. No  pleural effusion or pneumothorax. Cholecystectomy clips. IMPRESSION: No acute cardiopulmonary process. Electronically Signed   By: Lovey Newcomer M.D.   On: 12/14/2020 10:17   ECHOCARDIOGRAM COMPLETE  Result Date: 12/15/2020    ECHOCARDIOGRAM REPORT   Patient Name:   Brianna Barnes [place] Date of Exam: 12/15/2020 Medical Rec #:  [id number]            Height:       58.0 in Accession #:    [id number]           Weight:       102.0 lb Date of Birth:  1928/10/20           BSA:          1.368 m Patient Age:    87 years             BP:           136/60 mmHg Patient Gender: F                    HR:           40 bpm. Exam Location:  Inpatient Procedure: 2D Echo STAT ECHO Indications:    heart block  History:        Patient has no prior history of Echocardiogram examinations.                 Risk  Factors:Hypertension and Dyslipidemia.  Sonographer:    Johny Chess RDCS Referring Phys: [id number]  1. Left ventricular ejection fraction, by estimation, is 60 to 65%. The left ventricle has normal function. The left ventricle has no regional wall motion abnormalities. Left ventricular diastolic parameters are consistent with Grade I diastolic dysfunction (impaired relaxation). Elevated left atrial pressure.  2. Right ventricular systolic function is normal. The right ventricular size is normal. There is mildly elevated pulmonary artery systolic pressure.  3. Left atrial size was severely dilated.  4. The mitral valve is normal in structure. Mild mitral valve regurgitation. No evidence of mitral stenosis. Severe mitral annular calcification.  5. The aortic valve is tricuspid. Aortic valve regurgitation is mild. Mild to moderate aortic valve sclerosis/calcification is present, without any evidence of aortic stenosis.  6. The inferior vena cava is normal in size with greater than 50% respiratory variability, suggesting right atrial pressure of 3 mmHg. Comparison(s): No prior Echocardiogram. FINDINGS  Left Ventricle: Left ventricular ejection fraction, by estimation, is 60 to 65%. The left ventricle has normal function. The left ventricle has no regional wall motion abnormalities. The left ventricular internal cavity size was normal in size. There is  no left ventricular hypertrophy. Left ventricular diastolic parameters are consistent with Grade I diastolic dysfunction (impaired relaxation). Elevated left atrial pressure. Right Ventricle: The right ventricular size is normal. Right ventricular systolic function is normal. There is mildly elevated pulmonary artery systolic pressure. The tricuspid regurgitant velocity is 3.21 m/s, and with an assumed right atrial pressure of 3 mmHg, the estimated right ventricular systolic pressure is 23.5 mmHg. Left Atrium: Left atrial size was  severely dilated. Right Atrium: Right atrial size was normal in size. Pericardium: There is no evidence of pericardial effusion. Mitral Valve: The mitral valve is normal in structure. Severe mitral annular calcification. Mild mitral valve regurgitation. No evidence of mitral valve stenosis. Tricuspid Valve: The tricuspid valve is normal in structure. Tricuspid valve regurgitation is mild . No evidence of tricuspid stenosis. Aortic Valve: The aortic valve is tricuspid. Aortic  valve regurgitation is mild. Aortic regurgitation PHT measures 473 msec. Mild to moderate aortic valve sclerosis/calcification is present, without any evidence of aortic stenosis. Aortic valve mean gradient measures 7.0 mmHg. Aortic valve peak gradient measures 14.6 mmHg. Aortic valve area, by VTI measures 1.38 cm. Pulmonic Valve: The pulmonic valve was normal in structure. Pulmonic valve regurgitation is trivial. No evidence of pulmonic stenosis. Aorta: The aortic root is normal in size and structure. Venous: The inferior vena cava is normal in size with greater than 50% respiratory variability, suggesting right atrial pressure of 3 mmHg. IAS/Shunts: No atrial level shunt detected by color flow Doppler.  LEFT VENTRICLE PLAX 2D LVIDd:         4.10 cm  Diastology LVIDs:         2.50 cm  LV e' medial:    5.55 cm/s LV PW:         0.90 cm  LV E/e' medial:  17.4 LV IVS:        0.70 cm  LV e' lateral:   8.38 cm/s LVOT diam:     1.60 cm  LV E/e' lateral: 11.5 LV SV:         74 LV SV Index:   54 LVOT Area:     2.01 cm  RIGHT VENTRICLE             IVC RV S prime:     15.70 cm/s  IVC diam: 1.40 cm TAPSE (M-mode): 2.6 cm LEFT ATRIUM           Index       RIGHT ATRIUM          Index LA diam:      3.60 cm 2.63 cm/m  RA Area:     8.38 cm LA Vol (A4C): 70.9 ml 51.83 ml/m RA Volume:   15.10 ml 11.04 ml/m  AORTIC VALVE AV Area (Vmax):    1.34 cm AV Area (Vmean):   1.28 cm AV Area (VTI):     1.38 cm AV Vmax:           191.00 cm/s AV Vmean:           122.000 cm/s AV VTI:            0.535 m AV Peak Grad:      14.6 mmHg AV Mean Grad:      7.0 mmHg LVOT Vmax:         127.00 cm/s LVOT Vmean:        77.700 cm/s LVOT VTI:          0.367 m LVOT/AV VTI ratio: 0.69 AI PHT:            473 msec  AORTA Ao Root diam: 2.70 cm Ao Asc diam:  3.00 cm MITRAL VALVE                TRICUSPID VALVE MV Area (PHT): 2.24 cm     TR Peak grad:   41.2 mmHg MV Decel Time: 338 msec     TR Vmax:        321.00 cm/s MR Peak grad: 130.4 mmHg MR Mean grad: 86.0 mmHg     SHUNTS MR Vmax:      571.00 cm/s   Systemic VTI:  0.37 m MR Vmean:     447.0 cm/s    Systemic Diam: 1.60 cm MV E velocity: 96.70 cm/s MV A velocity: 123.00 cm/s MV E/A ratio:  0.79 Kirk Ruths MD Electronically signed by  Kirk Ruths MD Signature Date/Time: 12/15/2020/4:41:15 PM    Final     Patient Profile     Evoleht Bettye Sitton is a 87 y.o. female with a past medical history significant for cirrhosis, HCV, HTN, HLD, DDD, and GERD.  she was admitted for symptomatic bradycardia and transferred to [facility] for pacemaker consideration.   Assessment & Plan    Mobitz type II 2nd degree AV block:  Symptomatic with dizzy spells and near syncope Has been hemodynamically stable here Explained risks, benefits, and alternatives to PPM implantation, including but not limited to bleeding, infection, pneumothorax, pericardial effusion, lead dislodgement, heart attack, stroke, or death.  Will explain again to POA on her arrival.    HTN: Follow closely post pacer.    Mild dementia: Daughter is POA.   Plan for pacing later today. Will review risks and benefits and obtain consent on daughters arrival.   ADDENDUM Explained risks, benefits, and alternatives to PPM implantation, including but not limited to bleeding, infection, pneumothorax, pericardial effusion, lead dislodgement, heart attack, stroke, or death.  Pt and POA verbalized understanding and agree to proceed   For questions or updates, please contact [facility] Please consult [email or web address] for contact info under Cardiology/STEMI.  Signed, Shirley Friar, PA-C  12/16/2020, 8:33 AM    I have seen, examined the patient, and reviewed the above assessment and plan.  Changes to above are made where necessary.  On exam, RRR.  She is elderly and frail.  Confused but pleasant.    She presents with symptomatic second degree AV block. I would therefore recommend pacemaker implantation at this time as per guidelines.  Risks, benefits, alternatives to pacemaker implantation were discussed in detail with the patient, her daughter Henrietta Dine) and son in law today. The patient and her family understand that the risks include but are not limited to bleeding, infection, pneumothorax, perforation, tamponade, vascular damage, renal failure, MI, stroke, death,  and lead dislodgement and wishes to proceed. Given the patients very thin body habitus, fragility, and advanced age, she is aware that her risks of this procedure are quite high.  We will proceed with caution.   Co Sign: Thompson Grayer, MD 12/16/2020 2:41 PM

## 2020-12-17 ENCOUNTER — Encounter (HOSPITAL_COMMUNITY): Payer: Self-pay | Admitting: Internal Medicine

## 2020-12-17 ENCOUNTER — Inpatient Hospital Stay (HOSPITAL_COMMUNITY): Payer: Medicare Other

## 2020-12-17 NOTE — Discharge Summary (Addendum)
ELECTROPHYSIOLOGY PROCEDURE DISCHARGE SUMMARY    Patient ID: Brianna Barnes,  MRN: [id number], DOB/AGE: Jun 09, 1928 87 y.o.  Admit date: 12/15/2020 Discharge date: 12/17/2020  Primary Care Physician: Yvonna Alanis, NP  Primary Cardiologist: [name], MD  Electrophysiologist: New to Dr. Caryl Comes (Dr. Rayann Heman implanting)  Primary Discharge Diagnosis:  Symptomatic bradycardia due to Mobitz II 2nd degree AV block status post pacemaker implantation this admission  Secondary Discharge Diagnosis:  HTN Dementia  No Known Allergies   Procedures This Admission:  1.  Implantation of a St. Jude dual chamber PPM on 12/16/20 by Dr. Rayann Heman. The patient received a St. Jude model number [id number] PPM with model number [id number] right atrial lead and [id number] right ventricular lead. There were no immediate post procedure complications. 2.  CXR on 12/17/20 demonstrated no pneumothorax status post device implantation.   Brief HPI: Brianna Barnes is a 87 y.o. female was admitted for symptomatic bradycardia and electrophysiology team asked to see for consideration of PPM implantation.  Past medical history includes above.  The patient has had symptomatic bradycardia without reversible causes identified.  Risks, benefits, and alternatives to PPM implantation were reviewed with the patient who wished to proceed.   Hospital Course:  The patient was admitted and underwent implantation of a St. Jude dual chamber PPM with details as outlined above.  She was monitored on telemetry overnight which demonstrated appropriate pacing.  Left chest was without hematoma or ecchymosis.  The device was interrogated and found to be functioning normally.  CXR was obtained and demonstrated no pneumothorax status post device implantation.  Wound care, arm mobility, and restrictions were reviewed with the patient.  The patient was examined and considered stable for discharge to home.    Physical  Exam: Vitals:   12/16/20 2037 12/16/20 2349 12/17/20 0359 12/17/20 0842  BP: (!) 127/58 (!) 148/58 (!) 124/59 139/68  Pulse: 74 69 65 78  Resp:  18 20   Temp: 98.4 F (36.9 C) 98.6 F (37 C) 98.5 F (36.9 C) 98.6 F (37 C)  TempSrc: Oral Oral Oral Oral  SpO2: 99% 95%  95%  Weight:   51.7 kg     GEN- The patient is well appearing, alert and oriented x 3 today.   HEENT: normocephalic, atraumatic; sclera clear, conjunctiva pink; hearing intact; oropharynx clear; neck supple, no JVP Lymph- no cervical lymphadenopathy Lungs- Clear to ausculation bilaterally, normal work of breathing.  No wheezes, rales, rhonchi Heart- Regular rate and rhythm, no murmurs, rubs or gallops, PMI not laterally displaced GI- soft, non-tender, non-distended, bowel sounds present, no hepatosplenomegaly Extremities- no clubbing, cyanosis, or edema; DP/PT/radial pulses 2+ bilaterally MS- no significant deformity or atrophy Skin- warm and dry, no rash or lesion, left chest without hematoma/ecchymosis Psych- euthymic mood, full affect Neuro- strength and sensation are intact   Labs:   Lab Results  Component Value Date   WBC 5.5 12/15/2020   HGB 13.4 12/15/2020   HCT 39.3 12/15/2020   MCV 91.4 12/15/2020   PLT 159 12/15/2020    Recent Labs  Lab 12/16/20 0301  NA 138  K 3.7  CL 105  CO2 26  BUN 13  CREATININE 0.87  CALCIUM 7.8*  GLUCOSE 83    Discharge Medications:  Allergies as of 12/17/2020   No Known Allergies      Medication List     TAKE these medications    CALCIUM GUMMIES PO Take 2 tablets by mouth daily.   cetirizine 10 MG  tablet Commonly known as: ZYRTEC Take 10 mg by mouth daily as needed for allergies.   denosumab 60 MG/ML Sosy injection Commonly known as: PROLIA Inject 60 mg into the skin every 6 (six) months.   memantine 10 MG tablet Commonly known as: NAMENDA TAKE 1 TABLET BY MOUTH TWICE A DAY   pantoprazole 40 MG tablet Commonly known as: PROTONIX TAKE 1  TABLET BY MOUTH DAILY BEFORE BREAKFAST What changed: when to take this   sennosides-docusate sodium 8.6-50 MG tablet Commonly known as: SENOKOT-S Take 1 tablet by mouth daily as needed for constipation.   timolol 0.5 % ophthalmic solution Commonly known as: TIMOPTIC Place 1 drop into both eyes 2 (two) times daily.   traMADol 50 MG tablet Commonly known as: ULTRAM Take 50 mg by mouth every 6 (six) hours as needed for moderate pain.   traZODone 100 MG tablet Commonly known as: DESYREL Take 1 tablet (100 mg total) by mouth at bedtime.   vitamin D3 50 MCG (2000 UT) Caps Take 1 capsule by mouth daily in the afternoon.   vitamin E 180 MG (400 UNITS) capsule Take 400 Units by mouth daily.        Disposition:    Follow-up Information     Shirley Friar, PA-C Follow up.   Specialty: Physician Assistant Why: 10/14 at 1140 am for post pacemaker wound check Contact information: [place] [address] [phone]                 Duration of Discharge Encounter: Greater than 30 minutes including physician time.  Signed, Shirley Friar, PA-C  12/17/2020 9:30 AM   I have seen, examined the patient, and reviewed the above assessment and plan.  Changes to above are made where necessary.  On exam, RRR.  CXR reveals stable leads, no ptx.  Device interrogation is personally reviewed and normal.  DC to home with routine wound care and follow-up.  Co Sign: Thompson Grayer, MD

## 2020-12-17 NOTE — Progress Notes (Signed)
Pt has orders to be discharged. Discharge instructions given and pt has no additional questions at this time. Medication regimen reviewed and pt and family educated. Pt verbalized understanding and has no additional questions. Telemetry box removed. IV removed and site in good condition. Pt stable and waiting for transportation.

## 2020-12-17 NOTE — Discharge Instructions (Signed)
After Your Pacemaker   You have a St. Jude Pacemaker  ACTIVITY Do not lift your arm above shoulder height for 1 week after your procedure. After 7 days, you may progress as below.  You should remove your sling 24 hours after your procedure, unless otherwise instructed by your provider.     Wednesday December 24, 2020  Thursday December 25, 2020 Friday December 26, 2020 Saturday December 27, 2020   Do not lift, push, pull, or carry anything over 10 pounds with the affected arm until 6 weeks (Wednesday January 28, 2021 ) after your procedure.   You may drive AFTER your wound check, unless you have been told otherwise by your provider.   Ask your healthcare provider when you can go back to work   INCISION/Dressing If you are on a blood thinner such as Coumadin, Xarelto, Eliquis, Plavix, or Pradaxa please confirm with your provider when this should be resumed.   If large square, outer bandage is left in place, this can be removed after 24 hours from your procedure. Do not remove steri-strips or glue as below.   Monitor your Pacemaker site for redness, swelling, and drainage. Call the device clinic at [phone] if you experience these symptoms or fever/chills.  If your incision is sealed with Steri-strips or staples, you may shower 10 days after your procedure or when told by your provider. Do not remove the steri-strips or let the shower hit directly on your site. You may wash around your site with soap and water.    Avoid lotions, ointments, or perfumes over your incision until it is well-healed.  You may use a hot tub or a pool AFTER your wound check appointment if the incision is completely closed.  PAcemaker Alerts:  Some alerts are vibratory and others beep. These are NOT emergencies. Please call our office to let us know. If this occurs at night or on weekends, it can wait until the next business day. Send a remote transmission.  If your device is capable of reading fluid status  (for heart failure), you will be offered monthly monitoring to review this with you.   DEVICE MANAGEMENT Remote monitoring is used to monitor your pacemaker from home. This monitoring is scheduled every 91 days by our office. It allows [place] to keep an eye on the functioning of your device to ensure it is working properly. You will routinely see your Electrophysiologist annually (more often if necessary).   You should receive your ID card for your new device in 4-8 weeks. Keep this card with you at all times once received. Consider wearing a medical alert bracelet or necklace.  Your Pacemaker may be MRI compatible. This will be discussed at your next office visit/wound check.  You should avoid contact with strong electric or magnetic fields.   Do not use amateur (ham) radio equipment or electric (arc) welding torches. MP3 player headphones with magnets should not be used. Some devices are safe to use if held at least 12 inches (30 cm) from your Pacemaker. These include power tools, lawn mowers, and speakers. If you are unsure if something is safe to use, ask your health care provider.  When using your cell phone, hold it to the ear that is on the opposite side from the Pacemaker. Do not leave your cell phone in a pocket over the Pacemaker.  You may safely use electric blankets, heating pads, computers, and microwave ovens.  Call the office right away if: You have chest pain. You  feel more short of breath than you have felt before. You feel more light-headed than you have felt before. Your incision starts to open up.  This information is not intended to replace advice given to you by your health care provider. Make sure you discuss any questions you have with your health care provider.

## 2020-12-17 NOTE — Progress Notes (Signed)
  Mobility Specialist Criteria Algorithm Info.  Mobility Team: [facility] elevated:Self regulated Activity: Ambulated in room; Ambulated to bathroom; Transferred:  Chair to bed (in chair to bed after ambulating to bathroom) Range of motion: Active; All extremities Level of assistance: Contact guard assist, steadying assist Assistive device: None (HHA) Minutes sitting in chair:  Minutes stood:  Minutes ambulated: 2 minutes Distance ambulated (ft): 25 ft Mobility response: Tolerated well Bed Position: Semi-fowlers  Patient received in chair requesting assistance to bathroom. Required minimal assistance to stand with HHA + cues to not bear weight or lift RUE. Needed frequent reminder to not use right extremity. Ambulated in room to bathroom at min guard with steady gait. Tolerated well without complaint or incident, was left in bed with all needs met and family at bedside.   12/17/2020 10:44 AM

## 2020-12-18 ENCOUNTER — Telehealth: Payer: Self-pay | Admitting: *Deleted

## 2020-12-18 NOTE — Telephone Encounter (Signed)
Transition Care Management Follow-up Telephone Call Date of discharge and from where: 12/17/2020 [place] How have you been since you were released from the hospital? better Any questions or concerns? No  Items Reviewed: Did the pt receive and understand the discharge instructions provided? Yes  Medications obtained and verified? Yes  Other? No  Any new allergies since your discharge? No  Dietary orders reviewed? Yes Do you have support at home? Yes   Home Care and Equipment/Supplies: Were home health services ordered? yes If so, what is the name of the agency? Not sure  Has the agency set up a time to come to the patient's home? no Were any new equipment or medical supplies ordered?  No What is the name of the medical supply agency? na Were you able to get the supplies/equipment? not applicable Do you have any questions related to the use of the equipment or supplies? No  Functional Questionnaire: (I = Independent and D = Dependent) ADLs: I with assistance. Starting to use [employer]- I  Meal Prep- D  Eating- I  Maintaining continence- I  Transferring/Ambulation- I with assistance  Managing Meds- I  Follow up appointments reviewed:  PCP Hospital f/u appt confirmed? Yes  Scheduled to see [name] on 12/25/2020 @ 11. [place] [facility] f/u appt confirmed? No   Are transportation arrangements needed? No  If their condition worsens, is the pt aware to call PCP or go to the Emergency Dept.? Yes Was the patient provided with contact information for the PCP's office or ED? Yes Was to pt encouraged to call back with questions or concerns? Yes

## 2020-12-25 ENCOUNTER — Ambulatory Visit (INDEPENDENT_AMBULATORY_CARE_PROVIDER_SITE_OTHER): Payer: Medicare Other | Admitting: Orthopedic Surgery

## 2020-12-25 ENCOUNTER — Other Ambulatory Visit: Payer: Self-pay

## 2020-12-25 ENCOUNTER — Encounter: Payer: Self-pay | Admitting: Orthopedic Surgery

## 2020-12-25 VITALS — BP 118/78 | HR 98 | Temp 97.1°F | Ht <= 58 in | Wt 101.0 lb

## 2020-12-25 DIAGNOSIS — R5383 Other fatigue: Secondary | ICD-10-CM | POA: Diagnosis not present

## 2020-12-25 DIAGNOSIS — I10 Essential (primary) hypertension: Secondary | ICD-10-CM

## 2020-12-25 DIAGNOSIS — F5104 Psychophysiologic insomnia: Secondary | ICD-10-CM

## 2020-12-25 DIAGNOSIS — R531 Weakness: Secondary | ICD-10-CM

## 2020-12-25 DIAGNOSIS — R42 Dizziness and giddiness: Secondary | ICD-10-CM | POA: Diagnosis not present

## 2020-12-25 DIAGNOSIS — R001 Bradycardia, unspecified: Secondary | ICD-10-CM

## 2020-12-25 DIAGNOSIS — I441 Atrioventricular block, second degree: Secondary | ICD-10-CM

## 2020-12-25 DIAGNOSIS — F03A0 Unspecified dementia, mild, without behavioral disturbance, psychotic disturbance, mood disturbance, and anxiety: Secondary | ICD-10-CM | POA: Diagnosis not present

## 2020-12-25 DIAGNOSIS — R058 Other specified cough: Secondary | ICD-10-CM

## 2020-12-25 DIAGNOSIS — D509 Iron deficiency anemia, unspecified: Secondary | ICD-10-CM | POA: Diagnosis not present

## 2020-12-25 MED ORDER — IRON (FERROUS SULFATE) 325 (65 FE) MG PO TABS: 325.0000 mg | ORAL_TABLET | ORAL | 6 refills | Status: DC

## 2020-12-25 NOTE — Progress Notes (Addendum)
Careteam: Patient Care Team: Yvonna Alanis, NP as PCP - General (Adult Health Nurse Practitioner) [name], MD as PCP - Cardiology (Cardiology)  Seen by: [name], AGNP-C  PLACE OF SERVICE:  [place] Directive information    No Known Allergies  Chief Complaint  Patient presents with   Hospitalization Follow-up    Patient presents today for a hospitalization follow-up. She was admitted into [facility] hospital on 12/15/20-12/17/20 for Symptomatic bradycardia due to Mobitz II 2nd degree AV block status post pacemaker implantation.   Quality Metric Gaps    Zoster and COVID booster     HPI: Patient is a 87 y.o. female seen today for f/u s/p hospitalization at [facility] 10/03- 10/05 for symptomatic bradycardia, status post pacemaker implantation.   Followed by cardiology. F/u tomorrow to check on pacemaker insertion site. She hopes to be able to shower.   Tramadol discontinued by cardiologist. She denies any joint pain at this time. Discussed using voltaren gel for pain.   Reports improved confusion since having pacemaker placed. Daughter reports her short term memory has improved. She is able to remember things form yesterday. Remains on Namenda. No recent behavioral outbursts.   Still feeling fatigued. She use to take iron but stopped due to constipation. Discussed adding iron rich foods in diet, but she is a picky eater. Would like to try ferrous sulfate every other day.   Trazodone still helping with sleep.   Productive cough in the AM, ongoing for years. She was given liquid mucinex during hospitalization and liked it. She has been taking mucinex - one tablet daily, does not know strength.   DMV handicap form filled out for 3 months.   Review of Systems:  Review of Systems  Constitutional:  Negative for chills, fever, malaise/fatigue and weight loss.  HENT: Negative.    Eyes: Negative.   Respiratory:  Positive for cough and sputum production.  Negative for shortness of breath and wheezing.   Cardiovascular:  Negative for chest pain, palpitations, orthopnea and leg swelling.  Gastrointestinal:  Negative for abdominal pain, blood in stool, constipation, diarrhea, heartburn, nausea and vomiting.  Genitourinary:  Negative for dysuria and hematuria.  Musculoskeletal:  Negative for falls.       Hip pain  Skin: Negative.   Neurological:  Positive for dizziness and weakness. Negative for tingling and headaches.  Psychiatric/Behavioral:  Positive for memory loss. Negative for depression. The patient has insomnia. The patient is not nervous/anxious.    Past Medical History:  Diagnosis Date   Arthritis    Choledocholithiasis    Cirrhosis, cryptogenic (HCC)    FOLLOWED BY DR PYRTLE-- LOV  IN EPIC--  STABLE PER NOTE   Common bile duct stone    Cryptogenic cirrhosis (HCC)    DDD (degenerative disc disease), lumbar    Diverticulosis    Esophageal varix (HCC)    GERD (gastroesophageal reflux disease)    Hiatal hernia    History of colonoscopy with polypectomy    ADENOMATOUS AND HYPERPLASIA POLYPS  (2008  &  2010)   History of hepatitis C    History of positive PPD    + TB SKIN TEST WITHOUT TB   Hyperlipidemia    Hypertension, portal (HCC)    SECONDARY TO CRYPTOGENIC CIRRHOSIS   Internal hemorrhoids    Iron deficiency anemia    Loss of hearing    right ear   Osteoarthritis    hands, knees, and low back   Osteoporosis  Portal hypertension (HCC)    PUD (peptic ulcer disease)    Squamous cell carcinoma of skin 04/07/2017   left cheek   Tubular adenoma of colon    Uterovaginal prolapse, incomplete    Wears glasses    Wears hearing aid    left ear only   Past Surgical History:  Procedure Laterality Date   APPENDECTOMY  1960'S   CHOLECYSTECTOMY  08/25/2011   Procedure: LAPAROSCOPIC CHOLECYSTECTOMY WITH INTRAOPERATIVE CHOLANGIOGRAM;  Surgeon: Zenovia Jarred, MD;  Location: [facility];  Service: General;  Laterality: N/A;    ENDOSCOPIC RETROGRADE CHOLANGIOPANCREATOGRAPHY (ERCP) WITH PROPOFOL N/A 11/30/2012   Procedure: ENDOSCOPIC RETROGRADE CHOLANGIOPANCREATOGRAPHY (ERCP) WITH PROPOFOL;  Surgeon: Milus Banister, MD;  Location: [facility];  Service: Endoscopy;  Laterality: N/A;   ESOPHAGOGASTRODUODENOSCOPY N/A 05/18/2018   Procedure: ESOPHAGOGASTRODUODENOSCOPY (EGD);  Surgeon: Lin Landsman, MD;  Location: [facility] ENDOSCOPY;  Service: Gastroenterology;  Laterality: N/A;   EXTERNAL EAR SURGERY Left 1980   HYSTEROSCOPY WITH D & C N/A 07/15/2014   Procedure: DILATATION AND CURETTAGE /HYSTEROSCOPY;  Surgeon: Molli Posey, MD;  Location: [place];  Service: Gynecology;  Laterality: N/A;   INTRAMEDULLARY (IM) NAIL INTERTROCHANTERIC Left 01/22/2018   Procedure: ORIF LEFT INTERTROCHANTRIC HIP FX;  Surgeon: Mcarthur Rossetti, MD;  Location: [name];  Service: Orthopedics;  Laterality: Left;   KNEE ARTHROSCOPY W/ MENISCECTOMY Right 06-19-2002   LUMBAR [place] SURGERY  1990   PACEMAKER IMPLANT N/A 12/16/2020   Procedure: PACEMAKER IMPLANT;  Surgeon: Thompson Grayer, MD;  Location: [place] [facility];  Service: Cardiovascular;  Laterality: N/A;   REMOVAL OF URINARY SLING N/A 09/13/2013   Procedure: REMOVAL OF RETAINED PESSARY;  Surgeon: Jorja Loa, MD;  Location: [facility];  Service: Urology;  Laterality: N/A;   Social History:   reports that she has never smoked. She has never used smokeless tobacco. She reports that she does not drink alcohol and does not use drugs.  Family History  Problem Relation Age of Onset   Heart disease Mother    Heart disease Father 65   Hyperlipidemia Father    Diabetes Father    Cirrhosis Sister    Diabetes Brother    Heart disease Brother    Cirrhosis Brother    Pancreatic cancer Brother    Colon cancer Neg Hx    Colon polyps Neg Hx    Esophageal cancer Neg Hx    Stomach cancer Neg Hx    Inflammatory bowel disease Neg Hx      Medications: Patient's Medications  New Prescriptions   No medications on file  Previous Medications   CALCIUM-PHOSPHORUS-VITAMIN D (CALCIUM GUMMIES PO)    Take 2 tablets by mouth daily.   CETIRIZINE (ZYRTEC) 10 MG TABLET    Take 10 mg by mouth daily as needed for allergies.   CHOLECALCIFEROL (VITAMIN D3) 50 MCG (2000 UT) CAPS    Take 1 capsule by mouth daily in the afternoon.   DENOSUMAB (PROLIA) 60 MG/ML SOSY INJECTION    Inject 60 mg into the skin every 6 (six) months.   MEMANTINE (NAMENDA) 10 MG TABLET    TAKE 1 TABLET BY MOUTH TWICE A DAY   PANTOPRAZOLE (PROTONIX) 40 MG TABLET    TAKE 1 TABLET BY MOUTH DAILY BEFORE BREAKFAST   SENNOSIDES-DOCUSATE SODIUM (SENOKOT-S) 8.6-50 MG TABLET    Take 1 tablet by mouth daily as needed for constipation.   TIMOLOL (TIMOPTIC) 0.5 % OPHTHALMIC SOLUTION    Place 1 drop into  both eyes 2 (two) times daily.    TRAMADOL (ULTRAM) 50 MG TABLET    Take 50 mg by mouth every 6 (six) hours as needed for moderate pain.   TRAZODONE (DESYREL) 100 MG TABLET    Take 1 tablet (100 mg total) by mouth at bedtime.   VITAMIN E 400 UNIT CAPSULE    Take 400 Units by mouth daily.  Modified Medications   No medications on file  Discontinued Medications   No medications on file    Physical Exam:  Vitals:   12/25/20 1045  BP: 118/78  Pulse: 98  Temp: (!) 97.1 F (36.2 C)  SpO2: 98%  Weight: 101 lb (45.8 kg)  Height: 4\' 10"  (1.473 m)   Body mass index is 21.11 kg/m. Wt Readings from Last 3 Encounters:  12/25/20 101 lb (45.8 kg)  12/17/20 113 lb 15.7 oz (51.7 kg)  12/12/20 102 lb (46.3 kg)    Physical Exam Vitals reviewed.  Constitutional:      General: She is not in acute distress. HENT:     Head: Normocephalic.  Eyes:     General:        Right eye: No discharge.        Left eye: No discharge.  Neck:     Vascular: No carotid bruit.  Cardiovascular:     Rate and Rhythm: Normal rate and regular rhythm.     Pulses: Normal pulses.     Heart  sounds: Normal heart sounds. No murmur heard.    Comments: Left upper chest CDI, steri stripes in place, no drainage, surrounding skin intact.  Pulmonary:     Effort: Pulmonary effort is normal. No respiratory distress.     Breath sounds: Normal breath sounds. No wheezing.  Abdominal:     General: Abdomen is flat. Bowel sounds are normal. There is no distension.     Palpations: Abdomen is soft.     Tenderness: There is no abdominal tenderness.  Musculoskeletal:     Cervical back: Normal range of motion.     Right lower leg: No edema.     Left lower leg: No edema.  Lymphadenopathy:     Cervical: No cervical adenopathy.  Skin:    General: Skin is warm and dry.     Capillary Refill: Capillary refill takes less than 2 seconds.  Neurological:     General: No focal deficit present.     Mental Status: She is alert. Mental status is at baseline.     Motor: Weakness present.     Gait: Gait abnormal.  Psychiatric:        Mood and Affect: Mood normal.        Behavior: Behavior normal.        Cognition and Memory: Memory is impaired.    Labs reviewed: Basic Metabolic Panel: Recent Labs    05/23/20 1004 08/21/20 1520 12/08/20 1536 12/14/20 0928 12/15/20 1636 12/16/20 0301  NA 142   < > 142 140  --  138  K 3.8   < > 3.9 3.5  --  3.7  CL 105   < > 106 103  --  105  CO2 32   < > 30 28  --  26  GLUCOSE 83   < > 83 94  --  83  BUN 12   < > 26* 13  --  13  CREATININE 0.60   < > 0.86 0.56 0.83 0.87  CALCIUM 9.3   < > 9.5 8.2*  --  7.8*  TSH 2.54  --   --   --   --  1.930   < > = values in this interval not displayed.   Liver Function Tests: Recent Labs    01/09/20 0754 05/23/20 1004 12/08/20 1536  AST 40* 28 35  ALT 24 15 21   ALKPHOS 79  --   --   BILITOT 0.9 0.8 1.1  PROT 5.5* 5.3* 5.6*  ALBUMIN 3.7  --   --    No results for input(s): LIPASE, AMYLASE in the last 8760 hours. No results for input(s): AMMONIA in the last 8760 hours. CBC: Recent Labs    08/21/20 1520  10/08/20 1106 12/08/20 1536 12/14/20 0928 12/15/20 1636  WBC 4.9 4.0 5.3 4.2 5.5  NEUTROABS 2,822 2,600 2,836  --   --   HGB 11.6* 11.7 11.5* 11.9* 13.4  HCT 34.9* 34.5* 34.6* 34.5* 39.3  MCV 92.6 91.0 93.3 92.7 91.4  PLT 165 148 127* 138* 159   Lipid Panel: No results for input(s): CHOL, HDL, LDLCALC, TRIG, CHOLHDL, LDLDIRECT in the last 8760 hours. TSH: Recent Labs    05/23/20 1004 12/16/20 0301  TSH 2.54 1.930   A1C: Lab Results  Component Value Date   HGBA1C 4.9 01/18/2014     Assessment/Plan 1. Iron deficiency anemia, unspecified iron deficiency anemia type - hgb 11.9 12/16/2020 - use to be on iron, d/c due to constipation - still feels fatigued, would like to try iron again - recommend ferrous sulfate 325 mg QOD  - Iron, Ferrous Sulfate, 325 (65 Fe) MG TABS; Take 325 mg by mouth every other day.  Dispense: 30 tablet; Refill: 6  2. Mobitz type 2 second degree AV block - followed by cardiology - s/p pacemaker with hospitalization 10/03- 10/05 - doing well, reports less dizziness since procedure - implant site CDI  3. Bradycardia - d/t mobitz type 2 AV block - s/p pacemaker with hospitalization 10/03- 10/05 - continues to complain of dizziness  - seen furniture grabbing today - home health PT  4. Dizziness - see above  5. Essential hypertension -controlled without medication  6. Mild dementia without behavioral disturbance, psychotic disturbance, mood disturbance, or anxiety, unspecified dementia type - some memory improvement after pacemaker procedure - no recent behavioral outbursts - cont namenda  7. Chronic insomnia - sleeping well with Trazodone  8. Productive cough - chronic, started taking mucinex after hospitalization  9. Fatigue, unspecified type - multifactorial - still eating poorly, advanced age, anemia - TSH 1.93 12/16/2020 - recommend increasing calories for energy- discussed adding ice cream to ensure shakes  10. Weakness - she  is not ambulating long distances like before - gait unsteady at times - "furniture grabbing" with walker - home health PT/OT  Total time: 38 minutes. Greater than 50% of total time spent doing patient education on reasons for fatigue, treatment options and medication management.    Next appt: 02/12/2021  [name], [facility] [phone]

## 2020-12-25 NOTE — Patient Instructions (Addendum)
Ferrous sulfate 325 mg- one tablet every other day. Stop supplement if constipation begins.   Add ice cream to Ensure to increase calories.

## 2020-12-26 ENCOUNTER — Encounter: Payer: Self-pay | Admitting: Orthopedic Surgery

## 2020-12-26 ENCOUNTER — Other Ambulatory Visit: Payer: Self-pay | Admitting: Orthopedic Surgery

## 2020-12-26 ENCOUNTER — Ambulatory Visit (INDEPENDENT_AMBULATORY_CARE_PROVIDER_SITE_OTHER): Payer: Medicare Other | Admitting: Student

## 2020-12-26 DIAGNOSIS — I441 Atrioventricular block, second degree: Secondary | ICD-10-CM

## 2020-12-26 DIAGNOSIS — R2681 Unsteadiness on feet: Secondary | ICD-10-CM

## 2020-12-26 LAB — CUP PACEART INCLINIC DEVICE CHECK
Battery Remaining Longevity: 86 mo
Battery Voltage: 3.08 V
Brady Statistic RA Percent Paced: 1.1 %
Brady Statistic RV Percent Paced: 94 %
Date Time Interrogation Session: 20221014115812
Implantable Lead Implant Date: 20221004
Implantable Lead Implant Date: 20221004
Implantable Lead Location: 753859
Implantable Lead Location: 753860
Implantable Pulse Generator Implant Date: 20221004
Lead Channel Impedance Value: 475 Ohm
Lead Channel Impedance Value: 612.5 Ohm
Lead Channel Pacing Threshold Amplitude: 0.75 V
Lead Channel Pacing Threshold Amplitude: 0.75 V
Lead Channel Pacing Threshold Amplitude: 0.75 V
Lead Channel Pacing Threshold Amplitude: 0.75 V
Lead Channel Pacing Threshold Pulse Width: 0.5 ms
Lead Channel Pacing Threshold Pulse Width: 0.5 ms
Lead Channel Pacing Threshold Pulse Width: 0.5 ms
Lead Channel Pacing Threshold Pulse Width: 0.5 ms
Lead Channel Sensing Intrinsic Amplitude: 12 mV
Lead Channel Sensing Intrinsic Amplitude: 2.1 mV
Lead Channel Setting Pacing Amplitude: 3.5 V
Lead Channel Setting Pacing Amplitude: 3.5 V
Lead Channel Setting Pacing Pulse Width: 0.5 ms
Lead Channel Setting Sensing Sensitivity: 2 mV
Pulse Gen Model: 2272
Pulse Gen Serial Number: 3964696

## 2020-12-26 NOTE — Progress Notes (Signed)
Wound check appointment. Steri-strips removed. Wound without redness or edema. Incision edges approximated, wound well healed. Normal device function. Thresholds, sensing, and impedances consistent with implant measurements. Device programmed at 3.5V/auto capture programmed on for extra safety margin until 3 month visit. Histogram distribution appropriate for patient and level of activity. No mode switches or high ventricular rates noted. Patient educated about wound care, arm mobility, lifting restrictions. ROV in 3 months with Dr. Caryl Comes per pt request (lives in [place])

## 2020-12-26 NOTE — Patient Instructions (Signed)

## 2020-12-29 ENCOUNTER — Encounter: Payer: Self-pay | Admitting: Orthopedic Surgery

## 2020-12-30 NOTE — Addendum Note (Signed)
Addended byWindell Moulding E on: 12/30/2020 08:45 AM   Modules accepted: Orders

## 2021-01-01 DIAGNOSIS — I441 Atrioventricular block, second degree: Secondary | ICD-10-CM | POA: Diagnosis not present

## 2021-01-15 ENCOUNTER — Other Ambulatory Visit: Payer: Medicare Other

## 2021-01-23 ENCOUNTER — Ambulatory Visit: Payer: Medicare Other | Admitting: Cardiology

## 2021-01-23 ENCOUNTER — Other Ambulatory Visit: Payer: Medicare Other

## 2021-01-27 ENCOUNTER — Encounter: Payer: Self-pay | Admitting: Orthopedic Surgery

## 2021-01-29 ENCOUNTER — Ambulatory Visit (INDEPENDENT_AMBULATORY_CARE_PROVIDER_SITE_OTHER): Payer: Medicare Other | Admitting: Orthopedic Surgery

## 2021-01-29 ENCOUNTER — Other Ambulatory Visit: Payer: Self-pay

## 2021-01-29 DIAGNOSIS — Z0000 Encounter for general adult medical examination without abnormal findings: Secondary | ICD-10-CM | POA: Diagnosis not present

## 2021-01-29 NOTE — Progress Notes (Addendum)
Subjective:   Brianna Barnes is a 87 y.o. female who presents for Medicare Annual (Subsequent) preventive examination.  Review of Systems     Cardiac Risk Factors include: advanced age (>46men, >76 women);hypertension     Objective:    There were no vitals filed for this visit. There is no height or weight on file to calculate BMI.  Advanced Directives 12/16/2020 12/14/2020 08/25/2020 08/21/2020 05/30/2020 04/29/2020 01/08/2019  Does Patient Have a Medical Advance Directive? No No Yes Yes Yes Yes Yes  Type of Advance Directive - Public librarian;Living will [place];Living will [place];Living will [place];Living will;Out of facility DNR (pink MOST or yellow form) [place];Living will  Does patient want to make changes to medical advance directive? - - - No - Patient declined No - Patient declined No - Patient declined -  Copy of [place] in Chart? - - - Yes - validated most recent copy scanned in chart (See row information) Yes - validated most recent copy scanned in chart (See row information) Yes - validated most recent copy scanned in chart (See row information) No - copy requested  Would patient like information on creating a medical advance directive? - - - - - - -  Pre-existing out of facility DNR order (yellow form or pink MOST form) - - - - - - -    Current Medications (verified) Outpatient Encounter Medications as of 01/29/2021  Medication Sig   Calcium-Phosphorus-Vitamin D (CALCIUM GUMMIES PO) Take 2 tablets by mouth daily.   cetirizine (ZYRTEC) 10 MG tablet Take 10 mg by mouth daily as needed for allergies.   Cholecalciferol (VITAMIN D3) 50 MCG (2000 UT) CAPS Take 1 capsule by mouth daily in the afternoon.   denosumab (PROLIA) 60 MG/ML SOSY injection Inject 60 mg into the skin every 6 (six) months.   Iron, Ferrous Sulfate, 325 (65 Fe) MG TABS Take 325 mg by  mouth every other day.   memantine (NAMENDA) 10 MG tablet TAKE 1 TABLET BY MOUTH TWICE A DAY (Patient taking differently: Take 10 mg by mouth 2 (two) times daily.)   pantoprazole (PROTONIX) 40 MG tablet TAKE 1 TABLET BY MOUTH DAILY BEFORE BREAKFAST (Patient taking differently: Take 40 mg by mouth daily.)   sennosides-docusate sodium (SENOKOT-S) 8.6-50 MG tablet Take 1 tablet by mouth daily as needed for constipation.   timolol (TIMOPTIC) 0.5 % ophthalmic solution Place 1 drop into both eyes 2 (two) times daily.    traMADol (ULTRAM) 50 MG tablet Take 50 mg by mouth every 6 (six) hours as needed for moderate pain.   traZODone (DESYREL) 100 MG tablet Take 1 tablet (100 mg total) by mouth at bedtime.   vitamin E 400 UNIT capsule Take 400 Units by mouth daily.   No facility-administered encounter medications on file as of 01/29/2021.    Allergies (verified) Patient has no known allergies.   History: Past Medical History:  Diagnosis Date   Arthritis    Choledocholithiasis    Cirrhosis, cryptogenic (HCC)    FOLLOWED BY DR PYRTLE-- LOV  IN EPIC--  STABLE PER NOTE   Common bile duct stone    Cryptogenic cirrhosis (HCC)    DDD (degenerative disc disease), lumbar    Diverticulosis    Esophageal varix (HCC)    GERD (gastroesophageal reflux disease)    Hiatal hernia    History of colonoscopy with polypectomy    ADENOMATOUS AND HYPERPLASIA  POLYPS  (2008  &  2010)   History of hepatitis C    History of positive PPD    + TB SKIN TEST WITHOUT TB   Hyperlipidemia    Hypertension, portal (HCC)    SECONDARY TO CRYPTOGENIC CIRRHOSIS   Internal hemorrhoids    Iron deficiency anemia    Loss of hearing    right ear   Osteoarthritis    hands, knees, and low back   Osteoporosis    Portal hypertension (HCC)    PUD (peptic ulcer disease)    Squamous cell carcinoma of skin 04/07/2017   left cheek   Tubular adenoma of colon    Uterovaginal prolapse, incomplete    Wears glasses    Wears hearing  aid    left ear only   Past Surgical History:  Procedure Laterality Date   APPENDECTOMY  1960'S   CHOLECYSTECTOMY  08/25/2011   Procedure: LAPAROSCOPIC CHOLECYSTECTOMY WITH INTRAOPERATIVE CHOLANGIOGRAM;  Surgeon: Zenovia Jarred, MD;  Location: [place];  Service: General;  Laterality: N/A;   ENDOSCOPIC RETROGRADE CHOLANGIOPANCREATOGRAPHY (ERCP) WITH PROPOFOL N/A 11/30/2012   Procedure: ENDOSCOPIC RETROGRADE CHOLANGIOPANCREATOGRAPHY (ERCP) WITH PROPOFOL;  Surgeon: Milus Banister, MD;  Location: [facility];  Service: Endoscopy;  Laterality: N/A;   ESOPHAGOGASTRODUODENOSCOPY N/A 05/18/2018   Procedure: ESOPHAGOGASTRODUODENOSCOPY (EGD);  Surgeon: Lin Landsman, MD;  [facility];  Service: Gastroenterology;  Laterality: N/A;   EXTERNAL EAR SURGERY Left 1980   HYSTEROSCOPY WITH D & C N/A 07/15/2014   Procedure: DILATATION AND CURETTAGE /HYSTEROSCOPY;  Surgeon: Molli Posey, MD;  Location: [place];  Service: Gynecology;  Laterality: N/A;   INTRAMEDULLARY (IM) NAIL INTERTROCHANTERIC Left 01/22/2018   Procedure: ORIF LEFT INTERTROCHANTRIC HIP FX;  Surgeon: Mcarthur Rossetti, MD;  Location: [place];  Service: Orthopedics;  Laterality: Left;   KNEE ARTHROSCOPY W/ MENISCECTOMY Right 06-19-2002   LUMBAR [place] SURGERY  1990   PACEMAKER IMPLANT N/A 12/16/2020   Procedure: PACEMAKER IMPLANT;  Surgeon: Thompson Grayer, MD;  Location: [place] [facility];  Service: Cardiovascular;  Laterality: N/A;   REMOVAL OF URINARY SLING N/A 09/13/2013   Procedure: REMOVAL OF RETAINED PESSARY;  Surgeon: Jorja Loa, MD;  Location: [facility];  Service: Urology;  Laterality: N/A;   Family History  Problem Relation Age of Onset   Heart disease Mother    Heart disease Father 48   Hyperlipidemia Father    Diabetes Father    Cirrhosis Sister    Diabetes Brother    Heart disease Brother    Cirrhosis Brother    Pancreatic cancer Brother    Colon cancer Neg Hx     Colon polyps Neg Hx    Esophageal cancer Neg Hx    Stomach cancer Neg Hx    Inflammatory bowel disease Neg Hx    Social History   Socioeconomic History   Marital status: Widowed    Spouse name: Not on file   Number of children: 3   Years of education: Not on file   Highest education level: Not on file  Occupational History   Occupation: Retired, Licensed conveyancer  Tobacco Use   Smoking status: Never   Smokeless tobacco: Never  Vaping Use   Vaping Use: Never used  Substance and Sexual Activity   Alcohol use: No   Drug use: No   Sexual activity: Not Currently  Other Topics Concern   Not on file  Social History Narrative       Social Determinants  of Health   Financial Resource Strain: Low Risk    Difficulty of Paying Living Expenses: Not hard at all  Food Insecurity: No Food Insecurity   Worried About [place] in the Last Year: Never true   [place] in the Last Year: Never true  Transportation Needs: No Transportation Needs   Lack of Transportation (Medical): No   Lack of Transportation (Non-Medical): No  Physical Activity: Inactive   Days of Exercise per Week: 0 days   Minutes of Exercise per Session: 0 min  Stress: No Stress Concern Present   Feeling of Stress : Only a little  Social Connections: Moderately Integrated   Frequency of Communication with Friends and Family: More than three times a week   Frequency of Social Gatherings with Friends and Family: More than three times a week   Attends Religious Services: More than 4 times per year   Active Member of [employer] or Organizations: Yes   Attends Archivist Meetings: Never   Marital Status: Widowed    Tobacco Counseling Counseling given: Not Answered   Clinical Intake:  Pre-visit preparation completed: Yes  Pain : No/denies pain     BMI - recorded: 21.1 Nutritional Status: BMI of 19-24  Normal Nutritional Risks: None Diabetes: No  How often do you need to have someone help  you when you read instructions, pamphlets, or other written materials from your doctor or pharmacy?: 3 - Sometimes What is the last grade level you completed in school?: 10th grade high school  Diabetic?No  Interpreter Needed?: No      Activities of Daily Living In your present state of health, do you have any difficulty performing the following activities: 01/29/2021 01/29/2021  Hearing? Y Y  Comment - Wear hearing aid  Vision? N N  Comment - Wear glasses  Difficulty concentrating or making decisions? Tempie Donning  Walking or climbing stairs? Y Y  Comment - Use a cane  Dressing or bathing? N N  Doing errands, shopping? [place] and eating ? Y N  Using the Toilet? N N  In the past six months, have you accidently leaked urine? Y N  Do you have problems with loss of bowel control? N N  Managing your Medications? Y N  Managing your Finances? N Y  Housekeeping or managing your Housekeeping? N N  Some recent data might be hidden    Patient Care Team: Yvonna Alanis, NP as PCP - General (Adult Health Nurse Practitioner) [name], MD as PCP - Cardiology (Cardiology)  Indicate any recent Medical Services you may have received from other than [facility] providers in the past year (date may be approximate).     Assessment:   This is a routine wellness examination for Brianna Barnes.  Hearing/Vision screen Hearing Screening - Comments:: Wear hearing aid Vision Screening - Comments:: Wear glasses  Dietary issues and exercise activities discussed: Current Exercise Habits: The patient does not participate in regular exercise at present, Exercise limited by: None identified   Goals Addressed             This Visit's Progress    DIET - INCREASE WATER INTAKE   Not on track      Depression Screen [facility] 2/9 Scores 01/29/2021 01/29/2021 04/29/2020 01/24/2020 01/08/2019 02/25/2017 01/30/2016  PHQ - 2 Score 0 0 0 0 0 0 0  PHQ- 9 Score - - - 3 0 - -    Fall  Risk Fall  Risk  01/29/2021 01/29/2021 12/25/2020 08/21/2020 05/30/2020  Falls in the past year? 0 0 0 0 0  Number falls in past yr: 0 0 0 0 0  Injury with Fall? 0 0 0 0 0  Comment - - - - -  Risk for fall due to : Impaired balance/gait History of fall(s) History of fall(s) - -  Risk for fall due to: Comment walks with cane - - - -  Follow up Falls evaluation completed Falls evaluation completed;Education provided;Falls prevention discussed Falls evaluation completed;Education provided;Falls prevention discussed - -    FALL RISK PREVENTION PERTAINING TO THE HOME:  Any stairs in or around the home? Yes  If so, are there any without handrails? Yes  Home free of loose throw rugs in walkways, pet beds, electrical cords, etc? No  Adequate lighting in your home to reduce risk of falls? Yes   ASSISTIVE DEVICES UTILIZED TO PREVENT FALLS:  Life alert? Yes  Use of a cane, walker or w/c? Yes  Grab bars in the bathroom? Yes  Shower chair or bench in shower? Yes  Elevated toilet seat or a handicapped toilet? No   TIMED UP AND GO:  Was the test performed? No .  Length of time to ambulate 10 feet:  sec.   Gait slow and steady with assistive device  Cognitive Function: MMSE - Mini Mental State Exam 01/29/2021 05/30/2020 01/08/2019  Not completed: Refused - Unable to complete  Orientation to time - 5 -  Orientation to Place - 5 -  Registration - 3 -  Attention/ Calculation - 0 -  Recall - 3 -  Language- name 2 objects - 2 -  Language- repeat - 1 -  Language- follow 3 step command - 3 -  Language- read & follow direction - 1 -  Write a sentence - 1 -  Copy design - 1 -  Total score - 25 -     6CIT Screen 01/29/2021  What Year? 0 points  What month? 0 points  What time? 0 points  Count back from 20 0 points  Months in reverse 0 points  Repeat phrase 8 points  Total Score 8    Immunizations Immunization History  Administered Date(s) Administered   Fluad Quad(high Dose 65+) 11/21/2018    Influenza Split 12/27/2011   Influenza Whole 01/13/2009, 12/27/2011   Influenza, High Dose Seasonal PF 11/29/2016, 12/05/2017, 11/28/2019, 12/02/2020   Influenza, Seasonal, Injecte, Preservative Fre 12/18/2014, 11/27/2015   Influenza-Unspecified 11/17/2016, 11/21/2018   PFIZER Comirnaty(Gray Top)Covid-19 Tri-Sucrose Vaccine 06/19/2020   PFIZER(Purple Top)SARS-COV-2 Vaccination 06/28/2019, 07/24/2019, 02/14/2020   Pneumococcal Conjugate-13 01/25/2014   Pneumococcal Polysaccharide-23 03/15/2005   Td 03/15/2002   Tdap 02/25/2017   Zoster, Live 07/14/2011    TDAP status: Up to date  Flu Vaccine status: Up to date  Pneumococcal vaccine status: Up to date  Covid-19 vaccine status: Completed vaccines  Qualifies for Shingles Vaccine? Yes   Zostavax completed Yes   Shingrix Completed?: No.    Education has been provided regarding the importance of this vaccine. Patient has been advised to call insurance company to determine out of pocket expense if they have not yet received this vaccine. Advised may also receive vaccine at local pharmacy or Health Dept. Verbalized acceptance and understanding.  Screening Tests Health Maintenance  Topic Date Due   Zoster Vaccines- Shingrix (1 of 2) Never done   COVID-19 Vaccine (5 - Booster for Pfizer series) 08/14/2020   MAMMOGRAM  05/06/2021  TETANUS/TDAP  02/26/2027   Pneumonia Vaccine 51+ Years old  Completed   INFLUENZA VACCINE  Completed   DEXA SCAN  Completed   HPV VACCINES  Aged Out    Health Maintenance  Health Maintenance Due  Topic Date Due   Zoster Vaccines- Shingrix (1 of 2) Never done   COVID-19 Vaccine (5 - Booster for Pfizer series) 08/14/2020    Colorectal cancer screening: No longer required.   Mammogram status: Completed 2022. Repeat every year  Bone Density status: Completed 2021. Results reflect: Bone density results: OSTEOPOROSIS. Repeat every 2 years.  Lung Cancer Screening: (Low Dose CT Chest recommended if Age  31-80 years, 30 pack-year currently smoking OR have quit w/in 15years.) does not qualify.   Lung Cancer Screening Referral: No  Additional Screening:  Hepatitis C Screening: does not qualify; Completed   Vision Screening: Recommended annual ophthalmology exams for early detection of glaucoma and other disorders of the eye. Is the patient up to date with their annual eye exam?  Yes  Who is the provider or what is the name of the office in which the patient attends annual eye exams? Dr. Mordecai Rasmussen If pt is not established with a provider, would they like to be referred to a provider to establish care? No .   Dental Screening: Recommended annual dental exams for proper oral hygiene  Community Resource Referral / Chronic Care Management: CRR required this visit?  No   CCM required this visit?  No      Plan:     I have personally reviewed and noted the following in the patient's chart:   Medical and social history Use of alcohol, tobacco or illicit drugs  Current medications and supplements including opioid prescriptions.  Functional ability and status Nutritional status Physical activity Advanced directives List of other physicians Hospitalizations, surgeries, and ER visits in previous 12 months Vitals Screenings to include cognitive, depression, and falls Referrals and appointments  In addition, I have reviewed and discussed with patient certain preventive protocols, quality metrics, and best practice recommendations. A written personalized care plan for preventive services as well as general preventive health recommendations were provided to patient.     Yvonna Alanis, NP   01/29/2021   Telephone Note   I connected with Brianna Barnes by telephone and verified that I am speaking with the correct person using two identifiers.   Patient: Brianna Barnes Patient location: home Provider: [name] NP Provider location: [facility]    I discussed the limitations,  risks, security and privacy concerns of performing an evaluation and management service by telephone and the availability of in person appointments. I also discussed with the patient that there may be a patient responsible charge related to this service. The patient expressed understanding and agreed to proceed.    I discussed the assessment and treatment plan with the patient. The patient was provided an opportunity to ask questions and all were answered. The patient agreed with the plan and demonstrated an understanding of the instructions.     The patient was advised to call back or seek an in-person evaluation if the symptoms worsen or if the condition fails to improve as anticipated.   I provided 20 minutes of non-face-to-face time during this encounter.   [name], AGNP-C Avs printed and mailed

## 2021-01-29 NOTE — Progress Notes (Signed)
This service is provided via telemedicine  No vital signs collected/recorded due to the encounter was a telemedicine visit.   Location of patient (ex: home, work):  Home  Patient consents to a telephone visit:  Yes  Location of the provider (ex: office, home):  Office  Name of any referring provider:  Yvonna Alanis, NP   Names of all persons participating in the telemedicine service and their role in the encounter:  Brianna Barnes (patient) & Lesle Chris (daughter), Evlyn Clines Cletis Clack, CMA and [name] Fargo,NP  Time spent on call:  7 minutes

## 2021-01-29 NOTE — Patient Instructions (Signed)
  Brianna Barnes , Thank you for taking time to come for your Medicare Wellness Visit. I appreciate your ongoing commitment to your health goals. Please review the following plan we discussed and let me know if I can assist you in the future.   These are the goals we discussed:  Goals      DIET - INCREASE WATER INTAKE     Patient Stated     01/08/2019, Patient will maintain and continue medications as prescribed.         This is a list of the screening recommended for you and due dates:  Health Maintenance  Topic Date Due   Zoster (Shingles) Vaccine (1 of 2) Never done   COVID-19 Vaccine (5 - Booster for Pfizer series) 08/14/2020   Mammogram  05/06/2021   Tetanus Vaccine  02/26/2027   Pneumonia Vaccine  Completed   Flu Shot  Completed   DEXA scan (bone density measurement)  Completed   HPV Vaccine  Aged Out   Yearly health goal: drink more water

## 2021-01-30 ENCOUNTER — Encounter: Payer: Medicare Other | Admitting: Family Medicine

## 2021-02-12 ENCOUNTER — Encounter: Payer: Medicare Other | Admitting: Orthopedic Surgery

## 2021-02-19 ENCOUNTER — Other Ambulatory Visit: Payer: Self-pay

## 2021-02-19 ENCOUNTER — Ambulatory Visit (INDEPENDENT_AMBULATORY_CARE_PROVIDER_SITE_OTHER): Payer: Medicare Other | Admitting: Orthopedic Surgery

## 2021-02-19 ENCOUNTER — Encounter: Payer: Self-pay | Admitting: Orthopedic Surgery

## 2021-02-19 VITALS — BP 144/92 | HR 85 | Temp 97.1°F | Ht <= 58 in | Wt 105.8 lb

## 2021-02-19 DIAGNOSIS — I441 Atrioventricular block, second degree: Secondary | ICD-10-CM

## 2021-02-19 DIAGNOSIS — D509 Iron deficiency anemia, unspecified: Secondary | ICD-10-CM | POA: Diagnosis not present

## 2021-02-19 DIAGNOSIS — K5901 Slow transit constipation: Secondary | ICD-10-CM

## 2021-02-19 DIAGNOSIS — I10 Essential (primary) hypertension: Secondary | ICD-10-CM | POA: Diagnosis not present

## 2021-02-19 DIAGNOSIS — F03A0 Unspecified dementia, mild, without behavioral disturbance, psychotic disturbance, mood disturbance, and anxiety: Secondary | ICD-10-CM | POA: Diagnosis not present

## 2021-02-19 DIAGNOSIS — M810 Age-related osteoporosis without current pathological fracture: Secondary | ICD-10-CM

## 2021-02-19 DIAGNOSIS — M159 Polyosteoarthritis, unspecified: Secondary | ICD-10-CM

## 2021-02-19 MED ORDER — TRAMADOL HCL 50 MG PO TABS: 50.0000 mg | ORAL_TABLET | Freq: Four times a day (QID) | ORAL | 2 refills | Status: DC | PRN

## 2021-02-19 MED ORDER — DENOSUMAB 60 MG/ML ~~LOC~~ SOSY
60.0000 mg | PREFILLED_SYRINGE | Freq: Once | SUBCUTANEOUS | Status: AC
  Administered 2021-02-19: 60 mg via SUBCUTANEOUS

## 2021-02-19 NOTE — Patient Instructions (Signed)
Please get newest covid vaccine

## 2021-02-19 NOTE — Progress Notes (Signed)
Careteam: Patient Care Team: Yvonna Alanis, NP as PCP - General (Adult Health Nurse Practitioner) [name], MD as PCP - Cardiology (Cardiology)  Seen by: [name], AGNP-C  PLACE OF SERVICE:  [place] Directive information    No Known Allergies  Chief Complaint  Patient presents with   Medical Management of Chronic Issues    Patient presents today for a follow-up appointment.   Quality Metric Gaps    Zoster, COVID     HPI: Patient is a 87 y.o. female seen today for medical management of chronic conditions.   Prolia vaccine given today.   No recent falls. Weakness had improved. Using walker for long distances. Uses cane and furniture grabs at home. No recent falls or injuries. Wears life alert at home.   Dizziness only occurring when standing up fast.   Scheduled to see Dr. Caryl Comes 03/24/2021 for follow up. Denies chest pain and sob today. Pacemaker site healed.   Does not eat much except sweets. She has gained 4lbs since last visit. Drinking one Boost daily.  Plans to get covid booster soon.   Does not want Shingrix due to advanced age. She had zoster in past.    Review of Systems:  Review of Systems  Constitutional:  Negative for chills, fever, malaise/fatigue and weight loss.  HENT:  Positive for hearing loss. Negative for sore throat.   Eyes:  Negative for blurred vision and double vision.  Respiratory:  Negative for cough, shortness of breath and wheezing.   Cardiovascular:  Negative for chest pain, palpitations, orthopnea and leg swelling.  Gastrointestinal:  Positive for constipation. Negative for abdominal pain, blood in stool, diarrhea, heartburn, nausea and vomiting.  Genitourinary:  Negative for dysuria and hematuria.  Musculoskeletal:  Positive for joint pain. Negative for falls and myalgias.  Skin: Negative.   Neurological:  Positive for dizziness. Negative for weakness and headaches.  Psychiatric/Behavioral:  Negative for  depression. The patient is not nervous/anxious and does not have insomnia.    Past Medical History:  Diagnosis Date   Arthritis    Choledocholithiasis    Cirrhosis, cryptogenic (HCC)    FOLLOWED BY DR PYRTLE-- LOV  IN EPIC--  STABLE PER NOTE   Common bile duct stone    Cryptogenic cirrhosis (HCC)    DDD (degenerative disc disease), lumbar    Diverticulosis    Esophageal varix (HCC)    GERD (gastroesophageal reflux disease)    Hiatal hernia    History of colonoscopy with polypectomy    ADENOMATOUS AND HYPERPLASIA POLYPS  (2008  &  2010)   History of hepatitis C    History of positive PPD    + TB SKIN TEST WITHOUT TB   Hyperlipidemia    Hypertension, portal (HCC)    SECONDARY TO CRYPTOGENIC CIRRHOSIS   Internal hemorrhoids    Iron deficiency anemia    Loss of hearing    right ear   Osteoarthritis    hands, knees, and low back   Osteoporosis    Portal hypertension (HCC)    PUD (peptic ulcer disease)    Squamous cell carcinoma of skin 04/07/2017   left cheek   Tubular adenoma of colon    Uterovaginal prolapse, incomplete    Wears glasses    Wears hearing aid    left ear only   Past Surgical History:  Procedure Laterality Date   APPENDECTOMY  1960'S   CHOLECYSTECTOMY  08/25/2011   Procedure: LAPAROSCOPIC CHOLECYSTECTOMY WITH INTRAOPERATIVE CHOLANGIOGRAM;  Surgeon: Zenovia Jarred, MD;  Location: [place];  Service: General;  Laterality: N/A;   ENDOSCOPIC RETROGRADE CHOLANGIOPANCREATOGRAPHY (ERCP) WITH PROPOFOL N/A 11/30/2012   Procedure: ENDOSCOPIC RETROGRADE CHOLANGIOPANCREATOGRAPHY (ERCP) WITH PROPOFOL;  Surgeon: Milus Banister, MD;  Location: [facility];  Service: Endoscopy;  Laterality: N/A;   ESOPHAGOGASTRODUODENOSCOPY N/A 05/18/2018   Procedure: ESOPHAGOGASTRODUODENOSCOPY (EGD);  Surgeon: Lin Landsman, MD;  [facility]: [facility] ENDOSCOPY;  Service: Gastroenterology;  Laterality: N/A;   EXTERNAL EAR SURGERY Left 1980   HYSTEROSCOPY WITH D & C N/A 07/15/2014   Procedure:  DILATATION AND CURETTAGE /HYSTEROSCOPY;  Surgeon: Molli Posey, MD;  Location: [place];  Service: Gynecology;  Laterality: N/A;   INTRAMEDULLARY (IM) NAIL INTERTROCHANTERIC Left 01/22/2018   Procedure: ORIF LEFT INTERTROCHANTRIC HIP FX;  Surgeon: Mcarthur Rossetti, MD;  Location: [place];  Service: Orthopedics;  Laterality: Left;   KNEE ARTHROSCOPY W/ MENISCECTOMY Right 06-19-2002   LUMBAR [place] SURGERY  1990   PACEMAKER IMPLANT N/A 12/16/2020   Procedure: PACEMAKER IMPLANT;  Surgeon: Thompson Grayer, MD;  Location: [place] [facility];  Service: Cardiovascular;  Laterality: N/A;   REMOVAL OF URINARY SLING N/A 09/13/2013   Procedure: REMOVAL OF RETAINED PESSARY;  Surgeon: Jorja Loa, MD;  Location: [facility];  Service: Urology;  Laterality: N/A;   Social History:   reports that she has never smoked. She has never used smokeless tobacco. She reports that she does not drink alcohol and does not use drugs.  Family History  Problem Relation Age of Onset   Heart disease Mother    Heart disease Father 80   Hyperlipidemia Father    Diabetes Father    Cirrhosis Sister    Diabetes Brother    Heart disease Brother    Cirrhosis Brother    Pancreatic cancer Brother    Colon cancer Neg Hx    Colon polyps Neg Hx    Esophageal cancer Neg Hx    Stomach cancer Neg Hx    Inflammatory bowel disease Neg Hx     Medications: Patient's Medications  New Prescriptions   No medications on file  Previous Medications   CALCIUM-PHOSPHORUS-VITAMIN D (CALCIUM GUMMIES PO)    Take 2 tablets by mouth daily.   CETIRIZINE (ZYRTEC) 10 MG TABLET    Take 10 mg by mouth daily as needed for allergies.   CHOLECALCIFEROL (VITAMIN D3) 50 MCG (2000 UT) CAPS    Take 1 capsule by mouth daily in the afternoon.   DENOSUMAB (PROLIA) 60 MG/ML SOSY INJECTION    Inject 60 mg into the skin every 6 (six) months.   IRON, FERROUS SULFATE, 325 (65 FE) MG TABS    Take 325 mg by mouth every  other day.   MEMANTINE (NAMENDA) 10 MG TABLET    TAKE 1 TABLET BY MOUTH TWICE A DAY   PANTOPRAZOLE (PROTONIX) 40 MG TABLET    TAKE 1 TABLET BY MOUTH DAILY BEFORE BREAKFAST   SENNOSIDES-DOCUSATE SODIUM (SENOKOT-S) 8.6-50 MG TABLET    Take 1 tablet by mouth daily as needed for constipation.   TIMOLOL (TIMOPTIC) 0.5 % OPHTHALMIC SOLUTION    Place 1 drop into both eyes 2 (two) times daily.    TRAMADOL (ULTRAM) 50 MG TABLET    Take 50 mg by mouth every 6 (six) hours as needed for moderate pain.   TRAZODONE (DESYREL) 100 MG TABLET    Take 1 tablet (100 mg total) by mouth at bedtime.   VITAMIN E 400 UNIT CAPSULE  Take 400 Units by mouth daily.  Modified Medications   No medications on file  Discontinued Medications   No medications on file    Physical Exam:  Vitals:   02/19/21 0857  BP: (!) 144/92  Pulse: 85  Temp: (!) 97.1 F (36.2 C)  SpO2: 98%  Weight: 105 lb 12.8 oz (48 kg)  Height: 4\' 10"  (1.473 m)   Body mass index is 22.11 kg/m. Wt Readings from Last 3 Encounters:  02/19/21 105 lb 12.8 oz (48 kg)  12/25/20 101 lb (45.8 kg)  12/17/20 113 lb 15.7 oz (51.7 kg)    Physical Exam Vitals reviewed.  Constitutional:      General: She is not in acute distress. HENT:     Head: Normocephalic.  Eyes:     General:        Right eye: No discharge.        Left eye: No discharge.  Neck:     Vascular: No carotid bruit.  Cardiovascular:     Rate and Rhythm: Normal rate and regular rhythm.     Pulses: Normal pulses.     Heart sounds: Normal heart sounds. No murmur heard. Pulmonary:     Effort: Pulmonary effort is normal. No respiratory distress.     Breath sounds: Normal breath sounds. No wheezing.  Abdominal:     General: Bowel sounds are normal. There is no distension.     Palpations: Abdomen is soft.     Tenderness: There is no abdominal tenderness.  Musculoskeletal:     Cervical back: Normal range of motion.     Right lower leg: No edema.     Left lower leg: No edema.   Lymphadenopathy:     Cervical: No cervical adenopathy.  Skin:    General: Skin is warm and dry.     Capillary Refill: Capillary refill takes less than 2 seconds.  Neurological:     General: No focal deficit present.     Mental Status: She is alert and oriented to person, place, and time.  Psychiatric:        Mood and Affect: Mood normal.        Behavior: Behavior normal.    Labs reviewed: Basic Metabolic Panel: Recent Labs    05/23/20 1004 08/21/20 1520 12/08/20 1536 12/14/20 0928 12/15/20 1636 12/16/20 0301  NA 142   < > 142 140  --  138  K 3.8   < > 3.9 3.5  --  3.7  CL 105   < > 106 103  --  105  CO2 32   < > 30 28  --  26  GLUCOSE 83   < > 83 94  --  83  BUN 12   < > 26* 13  --  13  CREATININE 0.60   < > 0.86 0.56 0.83 0.87  CALCIUM 9.3   < > 9.5 8.2*  --  7.8*  TSH 2.54  --   --   --   --  1.930   < > = values in this interval not displayed.   Liver Function Tests: Recent Labs    05/23/20 1004 12/08/20 1536  AST 28 35  ALT 15 21  BILITOT 0.8 1.1  PROT 5.3* 5.6*   No results for input(s): LIPASE, AMYLASE in the last 8760 hours. No results for input(s): AMMONIA in the last 8760 hours. CBC: Recent Labs    08/21/20 1520 10/08/20 1106 12/08/20 1536 12/14/20 0928 12/15/20 1636  WBC  4.9 4.0 5.3 4.2 5.5  NEUTROABS 2,822 2,600 2,836  --   --   HGB 11.6* 11.7 11.5* 11.9* 13.4  HCT 34.9* 34.5* 34.6* 34.5* 39.3  MCV 92.6 91.0 93.3 92.7 91.4  PLT 165 148 127* 138* 159   Lipid Panel: No results for input(s): CHOL, HDL, LDLCALC, TRIG, CHOLHDL, LDLDIRECT in the last 8760 hours. TSH: Recent Labs    05/23/20 1004 12/16/20 0301  TSH 2.54 1.930   A1C: Lab Results  Component Value Date   HGBA1C 4.9 01/18/2014     Assessment/Plan 1. Primary osteoarthritis involving multiple joints - pain to joints unchanged - cont Tramadol prn - traMADol (ULTRAM) 50 MG tablet; Take 1 tablet (50 mg total) by mouth every 6 (six) hours as needed for moderate pain.   Dispense: 30 tablet; Refill: 2  2. Mobitz type 2 second degree AV block - followed by Dr. Caryl Comes - s/p pacemaker - doing well at home - some dizziness when standing fast  3. Mild dementia without behavioral disturbance, psychotic disturbance, mood disturbance, or anxiety, unspecified dementia type - no recent behavioral outbursts - continues to ambulate with cane - cont Namenda  4. Age-related osteoporosis without current pathological fracture - denosumab (PROLIA) injection 60 mg - advised to gain weight - Prolia in 6 months  5. Essential hypertension - controlled without medication  6. Iron deficiency anemia, unspecified iron deficiency anemia type - hgb 13.4 12/15/2020 - cont ferrous sulfate  7. Slow transit constipation - LBM today, abdomen soft - cont senna daily - encourage hydration  Total time: 31 minutes. Greater than 50% of total time spent doing patient education regarding falls prevention, weight gain and pain control.     Next appt: 06/25/2021  [name], [facility] [phone]

## 2021-02-20 ENCOUNTER — Ambulatory Visit: Payer: Medicare Other | Admitting: Orthopedic Surgery

## 2021-02-25 ENCOUNTER — Other Ambulatory Visit: Payer: Self-pay | Admitting: Family Medicine

## 2021-03-19 ENCOUNTER — Ambulatory Visit (INDEPENDENT_AMBULATORY_CARE_PROVIDER_SITE_OTHER): Payer: Medicare Other

## 2021-03-19 DIAGNOSIS — I441 Atrioventricular block, second degree: Secondary | ICD-10-CM | POA: Diagnosis not present

## 2021-03-19 LAB — CUP PACEART REMOTE DEVICE CHECK
Battery Remaining Longevity: 72 mo
Battery Remaining Percentage: 95.5 %
Battery Voltage: 3.01 V
Brady Statistic AP VP Percent: 1 %
Brady Statistic AP VS Percent: 1 %
Brady Statistic AS VP Percent: 69 %
Brady Statistic AS VS Percent: 30 %
Brady Statistic RA Percent Paced: 1.1 %
Brady Statistic RV Percent Paced: 69 %
Date Time Interrogation Session: 20230105040013
Implantable Lead Implant Date: 20221004
Implantable Lead Implant Date: 20221004
Implantable Lead Location: 753859
Implantable Lead Location: 753860
Implantable Pulse Generator Implant Date: 20221004
Lead Channel Impedance Value: 360 Ohm
Lead Channel Impedance Value: 560 Ohm
Lead Channel Pacing Threshold Amplitude: 0.75 V
Lead Channel Pacing Threshold Amplitude: 0.75 V
Lead Channel Pacing Threshold Pulse Width: 0.5 ms
Lead Channel Pacing Threshold Pulse Width: 0.5 ms
Lead Channel Sensing Intrinsic Amplitude: 1.9 mV
Lead Channel Sensing Intrinsic Amplitude: 12 mV
Lead Channel Setting Pacing Amplitude: 3.5 V
Lead Channel Setting Pacing Amplitude: 3.5 V
Lead Channel Setting Pacing Pulse Width: 0.5 ms
Lead Channel Setting Sensing Sensitivity: 2 mV
Pulse Gen Model: 2272
Pulse Gen Serial Number: 3964696

## 2021-03-24 ENCOUNTER — Ambulatory Visit (INDEPENDENT_AMBULATORY_CARE_PROVIDER_SITE_OTHER): Payer: Medicare Other | Admitting: Internal Medicine

## 2021-03-24 ENCOUNTER — Encounter: Payer: Self-pay | Admitting: Internal Medicine

## 2021-03-24 ENCOUNTER — Other Ambulatory Visit: Payer: Self-pay

## 2021-03-24 VITALS — BP 132/70 | HR 83 | Ht <= 58 in | Wt 111.0 lb

## 2021-03-24 DIAGNOSIS — R001 Bradycardia, unspecified: Secondary | ICD-10-CM | POA: Diagnosis not present

## 2021-03-24 DIAGNOSIS — I441 Atrioventricular block, second degree: Secondary | ICD-10-CM

## 2021-03-24 DIAGNOSIS — Z950 Presence of cardiac pacemaker: Secondary | ICD-10-CM

## 2021-03-24 LAB — PACEMAKER DEVICE OBSERVATION

## 2021-03-24 NOTE — Progress Notes (Signed)
Patient Care Team: Brianna Alanis, NP as PCP - General (Adult Health Nurse Practitioner) Brianna Sable, MD as PCP - Cardiology (Cardiology)   HPI  Brianna Barnes is a 88 y.o. female seen in follow-up for pacemaker implanted 10/22 ([employer]) for symptomatic second-degree AV block associated with dizziness and bradycardia with heart rates in the 30s.  Somewhat better since pacemaker implanted.  Less dizziness.  Dementia worse.  Daughter very grateful for our care  History of hypertension          DATE TEST EF   10/22 Echo  65 % MAC severe            Date Cr K Hgb  10/22 0.56 3.5 11.9               Records and Results Reviewed   Past Medical History:  Diagnosis Date   Arthritis    Choledocholithiasis    Cirrhosis, cryptogenic (HCC)    FOLLOWED BY DR PYRTLE-- LOV  IN EPIC--  STABLE PER NOTE   Common bile duct stone    Cryptogenic cirrhosis (HCC)    DDD (degenerative disc disease), lumbar    Diverticulosis    Esophageal varix (HCC)    GERD (gastroesophageal reflux disease)    Hiatal hernia    History of colonoscopy with polypectomy    ADENOMATOUS AND HYPERPLASIA POLYPS  (2008  &  2010)   History of hepatitis C    History of positive PPD    + TB SKIN TEST WITHOUT TB   Hyperlipidemia    Hypertension, portal (HCC)    SECONDARY TO CRYPTOGENIC CIRRHOSIS   Internal hemorrhoids    Iron deficiency anemia    Loss of hearing    right ear   Osteoarthritis    hands, knees, and low back   Osteoporosis    Portal hypertension (HCC)    PUD (peptic ulcer disease)    Squamous cell carcinoma of skin 04/07/2017   left cheek   Tubular adenoma of colon    Uterovaginal prolapse, incomplete    Wears glasses    Wears hearing aid    left ear only    Past Surgical History:  Procedure Laterality Date   APPENDECTOMY  1960'S   CHOLECYSTECTOMY  08/25/2011   Procedure: LAPAROSCOPIC CHOLECYSTECTOMY WITH INTRAOPERATIVE CHOLANGIOGRAM;  Surgeon: Brianna Jarred, MD;   Location: [place];  Service: General;  Laterality: N/A;   ENDOSCOPIC RETROGRADE CHOLANGIOPANCREATOGRAPHY (ERCP) WITH PROPOFOL N/A 11/30/2012   Procedure: ENDOSCOPIC RETROGRADE CHOLANGIOPANCREATOGRAPHY (ERCP) WITH PROPOFOL;  Surgeon: Brianna Banister, MD;  Location[facility];  Service: Endoscopy;  Laterality: N/A;   ESOPHAGOGASTRODUODENOSCOPY N/A 05/18/2018   Procedure: ESOPHAGOGASTRODUODENOSCOPY (EGD);  Surgeon: Brianna Landsman, MD;  Location: [facility] ENDOSCOPY;  Service: Gastroenterology;  Laterality: N/A;   EXTERNAL EAR SURGERY Left 1980   HYSTEROSCOPY WITH D & C N/A 07/15/2014   Procedure: DILATATION AND CURETTAGE /HYSTEROSCOPY;  Surgeon: Brianna Posey, MD;  Location: [place];  Service: Gynecology;  Laterality: N/A;   INTRAMEDULLARY (IM) NAIL INTERTROCHANTERIC Left 01/22/2018   Procedure: ORIF LEFT INTERTROCHANTRIC HIP FX;  Surgeon: Brianna Rossetti, MD;  Location: [place];  Service: Orthopedics;  Laterality: Left;   KNEE ARTHROSCOPY W/ MENISCECTOMY Right 06-19-2002   LUMBAR [place] SURGERY  1990   PACEMAKER IMPLANT N/A 12/16/2020   Procedure: PACEMAKER IMPLANT;  Surgeon: Brianna Grayer, MD;  Location: [place] [facility];  Service: Cardiovascular;  Laterality: N/A;   REMOVAL OF URINARY SLING N/A  09/13/2013   Procedure: REMOVAL OF RETAINED PESSARY;  Surgeon: Jorja Loa, MD;  Location: [facility];  Service: Urology;  Laterality: N/A;    Current Meds  Medication Sig   Calcium-Phosphorus-Vitamin D (CALCIUM GUMMIES PO) Take 2 tablets by mouth daily.   cetirizine (ZYRTEC) 10 MG tablet Take 10 mg by mouth daily as needed for allergies.   Cholecalciferol (VITAMIN D3) 50 MCG (2000 UT) CAPS Take 1 capsule by mouth daily in the afternoon.   denosumab (PROLIA) 60 MG/ML SOSY injection Inject 60 mg into the skin every 6 (six) months.   Iron, Ferrous Sulfate, 325 (65 Fe) MG TABS Take 325 mg by mouth every other day.   memantine (NAMENDA) 10 MG tablet TAKE 1  TABLET BY MOUTH TWICE A DAY   pantoprazole (PROTONIX) 40 MG tablet TAKE 1 TABLET BY MOUTH DAILY BEFORE BREAKFAST (Patient taking differently: Take 40 mg by mouth daily.)   sennosides-docusate sodium (SENOKOT-S) 8.6-50 MG tablet Take 1 tablet by mouth daily as needed for constipation.   timolol (TIMOPTIC) 0.5 % ophthalmic solution Place 1 drop into both eyes 2 (two) times daily.    traMADol (ULTRAM) 50 MG tablet Take 1 tablet (50 mg total) by mouth every 6 (six) hours as needed for moderate pain.   traZODone (DESYREL) 100 MG tablet Take 1 tablet (100 mg total) by mouth at bedtime.   vitamin E 400 UNIT capsule Take 400 Units by mouth daily.    No Known Allergies    Review of Systems negative except from HPI and PMH  Physical Exam BP 132/70 (BP Location: Left Arm, Patient Position: Sitting, Cuff Size: Normal)    Pulse 83    Ht _0  (1.473 m)    Wt 111 lb (50.3 kg)    SpO2 98%    BMI 23.20 kg/m  Tiny thin elderly woman appearing her stated age HENT normal Neck supple   Clear Device pocket well healed; without hematoma or erythema.  There is no tethering  Regular rate and rhythm, no  gallop No  murmur Abd-soft with active BS No Clubbing cyanosis  edema Skin-warm and dry A  Grossly normal sensory and motor function  ECG sinus with P-synchronous/ AV  pacing @ 83      CrCl cannot be calculated (Patient's most recent lab result is older than the maximum 21 days allowed.).   Assessment and  Plan 2AVB 2/complete heart block--intermittent  Hypertension  [name]  Pacemaker (DOI -10/22) [employer]    Device function is normal.  Site well-healed.    Blood pressure near normal without therapy.     Current medicines are reviewed at length with the patient today .  The patient does not  have concerns regarding medicines.

## 2021-03-24 NOTE — Patient Instructions (Signed)
Medication Instructions:  - Your physician recommends that you continue on your current medications as directed. Please refer to the Current Medication list given to you today.  *If you need a refill on your cardiac medications before your next appointment, please call your pharmacy*   Lab Work: - none ordered  If you have labs (blood work) drawn today and your tests are completely normal, you will receive your results only by: [place] (if you have MyChart) OR A paper copy in the mail If you have any lab test that is abnormal or we need to change your treatment, we will call you to review the results.   Testing/Procedures: - none ordered   Follow-Up: At [facility], you and your health needs are our priority.  As part of our continuing mission to provide you with exceptional heart care, we have created designated Provider Care Teams.  These Care Teams include your primary Cardiologist (physician) and Advanced Practice Providers (APPs -  Physician Assistants and Nurse Practitioners) who all work together to provide you with the care you need, when you need it.  We recommend signing up for the patient portal called "MyChart".  Sign up information is provided on this After Visit Summary.  MyChart is used to connect with patients for Virtual Visits (Telemedicine).  Patients are able to view lab/test results, encounter notes, upcoming appointments, etc.  Non-urgent messages can be sent to your provider as well.   To learn more about what you can do with MyChart, go to [email or web address].    Your next appointment:   9 month(s)  The format for your next appointment:   In Person  Provider:   Virl Axe, MD    Other Instructions N/a

## 2021-03-25 ENCOUNTER — Telehealth: Payer: Self-pay | Admitting: *Deleted

## 2021-03-25 NOTE — Telephone Encounter (Signed)
Son Notified and agreed.

## 2021-03-25 NOTE — Telephone Encounter (Signed)
She can stop having yearly mammograms at any time. Screening recommendations past age 86 are not recommended.  Last study was 04/2020.

## 2021-03-25 NOTE — Telephone Encounter (Signed)
LMOM to return call.

## 2021-03-25 NOTE — Telephone Encounter (Signed)
Son called and stated that patient received a letter in the mail stating that it was time for a Mammogram.   They are wondering due to patient's age if  this was necessary. Wants to know if you suggest they have this done or can they stop.   Please Advise.

## 2021-03-30 NOTE — Progress Notes (Signed)
Remote pacemaker transmission.   

## 2021-04-14 DIAGNOSIS — H401132 Primary open-angle glaucoma, bilateral, moderate stage: Secondary | ICD-10-CM | POA: Diagnosis not present

## 2021-04-17 ENCOUNTER — Emergency Department (HOSPITAL_COMMUNITY)
Admission: EM | Admit: 2021-04-17 | Discharge: 2021-04-18 | Disposition: A | Discharge status: Home / Self Care | Payer: Medicare Other | Attending: Emergency Medicine | Admitting: Emergency Medicine

## 2021-04-17 ENCOUNTER — Emergency Department (HOSPITAL_COMMUNITY): Payer: Medicare Other

## 2021-04-17 ENCOUNTER — Encounter (HOSPITAL_COMMUNITY): Payer: Self-pay | Admitting: *Deleted

## 2021-04-17 ENCOUNTER — Other Ambulatory Visit: Payer: Self-pay

## 2021-04-17 DIAGNOSIS — F0390 Unspecified dementia without behavioral disturbance: Secondary | ICD-10-CM | POA: Insufficient documentation

## 2021-04-17 DIAGNOSIS — K219 Gastro-esophageal reflux disease without esophagitis: Secondary | ICD-10-CM | POA: Diagnosis not present

## 2021-04-17 DIAGNOSIS — R0602 Shortness of breath: Secondary | ICD-10-CM | POA: Diagnosis not present

## 2021-04-17 DIAGNOSIS — I10 Essential (primary) hypertension: Secondary | ICD-10-CM | POA: Diagnosis not present

## 2021-04-17 DIAGNOSIS — E876 Hypokalemia: Secondary | ICD-10-CM | POA: Insufficient documentation

## 2021-04-17 LAB — BASIC METABOLIC PANEL
Anion gap: 7 (ref 5–15)
BUN: 16 mg/dL (ref 8–23)
CO2: 30 mmol/L (ref 22–32)
Calcium: 10.2 mg/dL (ref 8.9–10.3)
Chloride: 106 mmol/L (ref 98–111)
Creatinine, Ser: 0.73 mg/dL (ref 0.44–1.00)
GFR, Estimated: >60 mL/min (ref >60)
Glucose, Bld: 145 mg/dL — ABNORMAL HIGH (ref 70–99)
Potassium: 3.3 mmol/L — ABNORMAL LOW (ref 3.5–5.1)
Sodium: 143 mmol/L (ref 135–145)

## 2021-04-17 LAB — CBC WITH DIFFERENTIAL/PLATELET
Abs Immature Granulocytes: 0.01 10*3/uL (ref 0.00–0.07)
Basophils Absolute: 0 10*3/uL (ref 0.0–0.1)
Basophils Relative: 1 %
Eosinophils Absolute: 0.1 10*3/uL (ref 0.0–0.5)
Eosinophils Relative: 2 %
HCT: 39.6 % (ref 36.0–46.0)
Hemoglobin: 12.8 g/dL (ref 12.0–15.0)
Immature Granulocytes: 0 %
Lymphocytes Relative: 19 %
Lymphs Abs: 1 10*3/uL (ref 0.7–4.0)
MCH: 30.8 pg (ref 26.0–34.0)
MCHC: 32.3 g/dL (ref 30.0–36.0)
MCV: 95.2 fL (ref 80.0–100.0)
Monocytes Absolute: 0.7 10*3/uL (ref 0.1–1.0)
Monocytes Relative: 14 %
Neutro Abs: 3.3 10*3/uL (ref 1.7–7.7)
Neutrophils Relative %: 64 %
Platelets: 108 10*3/uL — ABNORMAL LOW (ref 150–400)
RBC: 4.16 MIL/uL (ref 3.87–5.11)
RDW: 13.4 % (ref 11.5–15.5)
WBC: 5.2 10*3/uL (ref 4.0–10.5)
nRBC: 0 % (ref 0.0–0.2)

## 2021-04-17 LAB — TROPONIN I (HIGH SENSITIVITY): Troponin I (High Sensitivity): 22 ng/L — ABNORMAL HIGH (ref <18)

## 2021-04-17 MED ORDER — ALUM & MAG HYDROXIDE-SIMETH 200-200-20 MG/5ML PO SUSP
30.0000 mL | Freq: Once | ORAL | Status: AC
  Administered 2021-04-17: 30 mL via ORAL
  Filled 2021-04-17: qty 30

## 2021-04-17 MED ORDER — LIDOCAINE VISCOUS HCL 2 % MT SOLN
15.0000 mL | Freq: Once | OROMUCOSAL | Status: AC
  Administered 2021-04-17: 15 mL via ORAL
  Filled 2021-04-17: qty 15

## 2021-04-17 NOTE — ED Triage Notes (Signed)
The pt has had some sob  since yesterday non exertional no pain anywhere

## 2021-04-17 NOTE — ED Provider Notes (Signed)
[facility] EMERGENCY DEPARTMENT Provider Note  CSN: [id number] Arrival date & time: 04/17/21 2034  Chief Complaint(s) Shortness of Breath  HPI Brianna Barnes is a 88 y.o. female with a past medical history listed below including Mobitz 2 heart block status post pacemaker in October here for 1 day of intermittent shortness of breath. Lasting several minutes at a time. Nonexertional. Patient having chest discomfort described as heartburn.  Reports that she has heartburn daily and this consistent with her normal GERD. Denies any recent fevers or infections. No coughing or congestion. No nausea or vomiting. No abdominal pain. No lower extremity/peripheral edema.  HPI  Past Medical History Past Medical History:  Diagnosis Date   Arthritis    Choledocholithiasis    Cirrhosis, cryptogenic ([place])    FOLLOWED BY DR PYRTLE-- LOV  IN EPIC--  STABLE PER NOTE   Common bile duct stone    Cryptogenic cirrhosis (HCC)    DDD (degenerative disc disease), lumbar    Diverticulosis    Esophageal varix (HCC)    GERD (gastroesophageal reflux disease)    Hiatal hernia    History of colonoscopy with polypectomy    ADENOMATOUS AND HYPERPLASIA POLYPS  (2008  &  2010)   History of hepatitis C    History of positive PPD    + TB SKIN TEST WITHOUT TB   Hyperlipidemia    Hypertension, portal (HCC)    SECONDARY TO CRYPTOGENIC CIRRHOSIS   Internal hemorrhoids    Iron deficiency anemia    Loss of hearing    right ear   Osteoarthritis    hands, knees, and low back   Osteoporosis    Portal hypertension (HCC)    PUD (peptic ulcer disease)    Squamous cell carcinoma of skin 04/07/2017   left cheek   Tubular adenoma of colon    Uterovaginal prolapse, incomplete    Wears glasses    Wears hearing aid    left ear only   Patient Active Problem List   Diagnosis Date Noted   Mobitz type 2 second degree atrioventricular block 12/15/2020   Mobitz type 2 second degree AV block  12/15/2020   Allergic rhinitis 08/22/2019   Mild dementia 01/12/2019   History of DVT (deep vein thrombosis) 03/09/2018   Carotid artery calcification, bilateral 01/21/2018   S/p left hip fracture 01/21/2018   Situational anxiety 11/04/2017   Chronic insomnia 04/03/2016   Malnutrition of moderate degree ([place]) 01/30/2016   Counseling regarding end of life decision making 01/25/2014   Choledocholithiasis 11/30/2012   Cryptogenic cirrhosis ([place]) 11/22/2012   Family history of early CAD 10/29/2010   Vitamin D deficiency 06/24/2009   Essential hypertension, benign 06/13/2009   Hyperlipidemia 06/06/2009   Iron deficiency anemia 09/17/2008   GERD 09/17/2008   PEPTIC ULCER DISEASE 09/17/2008   UTEROVAGINAL PROLAPSE, INCOMPLETE 09/17/2008   DJD (degenerative joint disease) 09/17/2008   Osteoporosis 09/17/2008   ARTERIOVENOUS MALFORMATION 09/17/2008   POSITIVE TB SKIN TEST, WITHOUT TUBERCULOSIS 09/17/2008   COLONIC POLYPS, BENIGN, HX OF 09/17/2008   Home Medication(s) Prior to Admission medications   Medication Sig Start Date End Date Taking? Authorizing Provider  Calcium-Phosphorus-Vitamin D (CALCIUM GUMMIES PO) Take 2 tablets by mouth daily.    [provider]  cetirizine (ZYRTEC) 10 MG tablet Take 10 mg by mouth daily as needed for allergies.    [provider]  Cholecalciferol (VITAMIN D3) 50 MCG (2000 UT) CAPS Take 1 capsule by mouth daily in the afternoon.  [provider]  denosumab (PROLIA) 60 MG/ML SOSY injection Inject 60 mg into the skin every 6 (six) months.    [provider]  Iron, Ferrous Sulfate, 325 (65 Fe) MG TABS Take 325 mg by mouth every other day. 12/25/20   Fargo, [name] E, NP  memantine (NAMENDA) 10 MG tablet TAKE 1 TABLET BY MOUTH TWICE A DAY 02/25/21   Fargo, [name] E, NP  pantoprazole (PROTONIX) 40 MG tablet TAKE 1 TABLET BY MOUTH DAILY BEFORE BREAKFAST Patient taking differently: Take 40 mg by mouth daily. 05/19/20   Fargo, [name] E, NP   sennosides-docusate sodium (SENOKOT-S) 8.6-50 MG tablet Take 1 tablet by mouth daily as needed for constipation.    [provider]  timolol (TIMOPTIC) 0.5 % ophthalmic solution Place 1 drop into both eyes 2 (two) times daily.  01/22/15   [provider]  traMADol (ULTRAM) 50 MG tablet Take 1 tablet (50 mg total) by mouth every 6 (six) hours as needed for moderate pain. 02/19/21   Fargo, [name] E, NP  traZODone (DESYREL) 100 MG tablet Take 1 tablet (100 mg total) by mouth at bedtime. 12/08/20   Fargo, [name] E, NP  vitamin E 400 UNIT capsule Take 400 Units by mouth daily.    [provider]                                                                                                                                    Allergies Patient has no known allergies.  Review of Systems Review of Systems As noted in HPI  Physical Exam Vital Signs  I have reviewed the triage vital signs BP 133/75    Pulse 94    Temp 98.3 F (36.8 C) (Oral)    Resp 18    Ht 4\' 10"  (1.473 m)    Wt 50.3 kg    SpO2 93%    BMI 23.18 kg/m   Physical Exam Vitals reviewed.  Constitutional:      General: She is not in acute distress.    Appearance: She is well-developed. She is not diaphoretic.  HENT:     Head: Normocephalic and atraumatic.     Nose: Nose normal.  Eyes:     General: No scleral icterus.       Right eye: No discharge.        Left eye: No discharge.     Conjunctiva/sclera: Conjunctivae normal.     Pupils: Pupils are equal, round, and reactive to light.  Cardiovascular:     Rate and Rhythm: Normal rate and regular rhythm.     Heart sounds: No murmur heard.   No friction rub. No gallop.  Pulmonary:     Effort: Pulmonary effort is normal. No respiratory distress.     Breath sounds: Normal breath sounds. No stridor. No rales.  Chest:    Abdominal:     General: There is no distension.  Palpations: Abdomen is soft.     Tenderness: There is no abdominal tenderness.   Musculoskeletal:        General: No tenderness.     Cervical back: Normal range of motion and neck supple.     Right lower leg: No edema.     Left lower leg: No edema.  Skin:    General: Skin is warm and dry.     Findings: No erythema or rash.  Neurological:     Mental Status: She is alert and oriented to person, place, and time.    ED Results and Treatments Labs (all labs ordered are listed, but only abnormal results are displayed) Labs Reviewed  BASIC METABOLIC PANEL - Abnormal; Notable for the following components:      Result Value   Potassium 3.3 (*)    Glucose, Bld 145 (*)    All other components within normal limits  CBC WITH DIFFERENTIAL/PLATELET - Abnormal; Notable for the following components:   Platelets 108 (*)    All other components within normal limits  TROPONIN I (HIGH SENSITIVITY) - Abnormal; Notable for the following components:   Troponin I (High Sensitivity) 22 (*)    All other components within normal limits  TROPONIN I (HIGH SENSITIVITY) - Abnormal; Notable for the following components:   Troponin I (High Sensitivity) 19 (*)    All other components within normal limits                                                                                                                         EKG  EKG Interpretation  Date/Time:  Friday April 17 2021 21:01:21 EST Ventricular Rate:  93 PR Interval:  224 QRS Duration: 156 QT Interval:  432 QTC Calculation: 537 R Axis:   -70 Text Interpretation: Atrial-sensed ventricular-paced rhythm with prolonged AV conduction Abnormal ECG When compared with ECG of 17-Dec-2020 05:22, PREVIOUS ECG IS PRESENT Confirmed by Addison Lank [phone]) on 04/17/2021 11:10:19 PM       Radiology DG Chest 2 View  Result Date: 04/17/2021 CLINICAL DATA:  Shortness of breath. EXAM: CHEST - 2 VIEW COMPARISON:  12/17/2020. FINDINGS: The heart is enlarged and mediastinal contours are within normal limits. Atherosclerotic calcification of  the aorta is noted. Lung volumes are low. Chronic interstitial thickening is noted bilaterally. No consolidation, effusion, or pneumothorax. A dual lead pacemaker is present over the left chest. Surgical clips are present in the right upper quadrant. No acute osseous abnormality. IMPRESSION: Stable chest with no acute cardiopulmonary process. Electronically Signed   By: Brett Fairy M.D.   On: 04/17/2021 21:58    Pertinent labs & imaging results that were available during my care of the patient were reviewed by me and considered in my medical decision making (see MDM for details).  Medications Ordered in ED Medications  alum & mag hydroxide-simeth (MAALOX/MYLANTA) 200-200-20 MG/5ML suspension 30 mL (30 mLs Oral Given 04/17/21 2321)    And  lidocaine (XYLOCAINE) 2 % viscous  mouth solution 15 mL (15 mLs Oral Given 04/17/21 2321)                                                                                                                                     Procedures .1-3 Lead EKG Interpretation Performed by: Fatima Blank, MD Authorized by: Fatima Blank, MD     Interpretation: normal     ECG rate:  90   ECG rate assessment: normal     Rhythm comment:  Paced   Ectopy: PVCs     Ectopy comment:  Infrequent   Conduction: normal   Ultrasound ED Echo  Date/Time: 04/18/2021 1:00 AM Performed by: Fatima Blank, MD Authorized by: Fatima Blank, MD   Procedure details:    Indications: dyspnea     Views: parasternal long axis view, parasternal short axis view and apical 4 chamber view     Images: archived     Limitations:  Acoustic shadowing and positioning Findings:    Pericardium: no pericardial effusion     LV Function: normal (>50% EF)   Impression:    Impression: normal    (including critical care time)  Medical Decision Making / ED Course        Shortness of breath Reports is intermittent. No associated infectious symptoms. No evidence of  volume overload on exam concerning for heart failure. Possibly related to GERD but given patient's age and comorbidities including hypertension, hyperlipidemia and known cardiac disease, will rule out ACS (though low suspicion). Patient's not anticoagulated but will assess for possible anemia. Will also assess for pericardial effusion that can be caused from microperforation of myocardium from lead coils (especially in elderly)  Work-up ordered to assess concerns above.  EKG, labs, imaging independently interpreted by me and noted below: EKG with paced rhythm, approximately 90.  Chest x-ray without evidence of pneumonia,PTx, pulmonary edema or effusion CBC without leukocytosis or anemia Mild hypokalemia without other significant electrolyte derangement or renal insufficiency noted on metabolic panel Initial troponin slightly elevated at 22. Second trop flat at 19. Slightly elevated trops likely leak from pacemaker leads. Doubt ACS POCUS without evidence of pericardial effusion.  Management: Will treat GERD initially and reassess. Tele monitor No significant tachydysrhythmias noted   Reassessment: Patient's acid reflux completely resolved. She is no longer having shortness of breath.   Final Clinical Impression(s) / ED Diagnoses Final diagnoses:  SOB (shortness of breath)  Gastroesophageal reflux disease, unspecified whether esophagitis present   The patient appears reasonably screened and/or stabilized for discharge and I doubt any other medical condition or other [facility] requiring further screening, evaluation, or treatment in the ED at this time prior to discharge. Safe for discharge with strict return precautions.  Disposition: Discharge  Condition: Good  I have discussed the results, Dx and Tx plan with the patient/family who expressed understanding and agree(s) with the plan. Discharge instructions discussed at length. The patient/family  was given strict return precautions who  verbalized understanding of the instructions. No further questions at time of discharge.    ED Discharge Orders     None       Follow Up: Yvonna Alanis, NP [address] [place] [address] [phone]  Call  to schedule an appointment for close follow up  Deboraha Sprang, MD [address] [place] [address] [phone]  Call  to schedule an appointment for close follow up            This chart was dictated using voice recognition software.  Despite best efforts to proofread,  errors can occur which can change the documentation meaning.    Fatima Blank, MD 04/18/21 [phone]

## 2021-04-17 NOTE — ED Provider Triage Note (Signed)
Emergency Medicine Provider Triage Evaluation Note  Brianna Barnes , a 88 y.o. female  was evaluated in triage.  Pt complains of shortness of breath at rest onset today.  Patient had a pacemaker placed in October 2022.  She follows up with her cardiologist.  Patient has associated chest pain that she describes as heartburn sensation.  Has not tried medication for symptoms.  Denies nausea, vomiting, fever, chills.  Review of Systems  Positive: As per HPI above. Negative: As per HPI above  Physical Exam  BP (!) 166/77    Pulse 96    Temp 98.7 F (37.1 C) (Oral)    Resp 18    Ht 4\' 10"  (1.473 m)    Wt 50.3 kg    SpO2 96%    BMI 23.18 kg/m  Gen:   Awake, no distress   Resp:  Normal effort  MSK:   Moves extremities without difficulty  Other:  No chest wall tenderness to palpation.  Medical Decision Making  Medically screening exam initiated at 8:57 PM.  Appropriate orders placed.  Brianna Barnes was informed that the remainder of the evaluation will be completed by another provider, this initial triage assessment does not replace that evaluation, and the importance of remaining in the ED until their evaluation is complete.    Omarian Jaquith A, PA-C 04/17/21 [phone]

## 2021-04-18 LAB — TROPONIN I (HIGH SENSITIVITY): Troponin I (High Sensitivity): 19 ng/L — ABNORMAL HIGH (ref <18)

## 2021-04-18 NOTE — Discharge Instructions (Signed)
Your work-up today was reassuring and did not show evidence of severe heart damage.  Your troponins (heart enzyme) were slightly elevated but flat and stable.  This is likely related to a minor troponin leak from pacemaker leads.  Ultrasound of the heart did not reveal evidence of fluid around your heart concerning for microperforation from the leads. Your chest x-ray was negative for any pneumonia or fluid in the lungs.  You do have chronic changes that have been noted before and are unchanged.  Your shortness of breath seem to resolve after getting a GI cocktail which may mean that you it was related to your acid reflux.

## 2021-04-21 DIAGNOSIS — H401132 Primary open-angle glaucoma, bilateral, moderate stage: Secondary | ICD-10-CM | POA: Diagnosis not present

## 2021-04-22 DIAGNOSIS — N8111 Cystocele, midline: Secondary | ICD-10-CM | POA: Diagnosis not present

## 2021-04-23 ENCOUNTER — Other Ambulatory Visit: Payer: Self-pay

## 2021-04-23 ENCOUNTER — Encounter: Payer: Self-pay | Admitting: Orthopedic Surgery

## 2021-04-23 ENCOUNTER — Ambulatory Visit (INDEPENDENT_AMBULATORY_CARE_PROVIDER_SITE_OTHER): Payer: Medicare Other | Admitting: Orthopedic Surgery

## 2021-04-23 VITALS — BP 136/80 | HR 75 | Temp 96.8°F | Ht <= 58 in | Wt 111.2 lb

## 2021-04-23 DIAGNOSIS — E876 Hypokalemia: Secondary | ICD-10-CM | POA: Diagnosis not present

## 2021-04-23 DIAGNOSIS — I441 Atrioventricular block, second degree: Secondary | ICD-10-CM

## 2021-04-23 DIAGNOSIS — K219 Gastro-esophageal reflux disease without esophagitis: Secondary | ICD-10-CM | POA: Diagnosis not present

## 2021-04-23 DIAGNOSIS — R0602 Shortness of breath: Secondary | ICD-10-CM | POA: Diagnosis not present

## 2021-04-23 DIAGNOSIS — F03A0 Unspecified dementia, mild, without behavioral disturbance, psychotic disturbance, mood disturbance, and anxiety: Secondary | ICD-10-CM

## 2021-04-23 LAB — BASIC METABOLIC PANEL
BUN: 14 mg/dL (ref 7–25)
CO2: 30 mmol/L (ref 20–32)
Calcium: 8.3 mg/dL — ABNORMAL LOW (ref 8.6–10.4)
Chloride: 109 mmol/L (ref 98–110)
Creat: 0.61 mg/dL (ref 0.60–0.95)
Glucose, Bld: 84 mg/dL (ref 65–99)
Potassium: 3.6 mmol/L (ref 3.5–5.3)
Sodium: 143 mmol/L (ref 135–146)

## 2021-04-23 MED ORDER — PANTOPRAZOLE SODIUM 20 MG PO TBEC: 20.0000 mg | DELAYED_RELEASE_TABLET | Freq: Every day | ORAL | 0 refills | Status: DC

## 2021-04-23 NOTE — Patient Instructions (Signed)
May use pulse ox- to measure breathing  Blood pressures ok if less than < 180/90, if pressures continue to be elevated > 150/90 contact cardiology  [facility] has on call provider- you can always call office

## 2021-04-23 NOTE — Progress Notes (Signed)
Careteam: Patient Care Team: Yvonna Alanis, NP as PCP - General (Adult Health Nurse Practitioner) [name], MD as PCP - Cardiology (Cardiology)  Seen by: [name], AGNP-C  PLACE OF SERVICE:  [place] Directive information Does Patient Have a Medical Advance Directive?: Yes, Type of Advance Directive: [place];Living will, Does patient want to make changes to medical advance directive?: No - Patient declined  No Known Allergies  Chief Complaint  Patient presents with   Follow-up    ER follow up.     HPI: Patient is a 88 y.o. female seen today for follow up s/p ED visit 02/03.   Son and daughter in law recently moved in with her.   Son in law present during encounter today.   Prior to visit she was feeling short of breath. Son took blood pressure and noticed it was elevated, SBP > 160.   02/03 she presented to the ED with shortness of breath and heartburn. Vitals stable, O2 sat 93% on room air. S/p pacemaker 12/2020 due to Mobitz 2 hear block. EKG- paced rhythm. ED echo EF > 50%. Troponin 22,19. K+ 3.3. CXR unremarkable. She was given some Maalox and discharged home same day.   She has not had any episodes of sob. Reports increased heartburn, she is taking Protonix 40 mg daily.   Review of Systems:  Review of Systems  Constitutional:  Negative for chills, fever, malaise/fatigue and weight loss.  HENT:  Negative for hearing loss and sore throat.   Eyes:  Negative for blurred vision and double vision.  Respiratory:  Positive for shortness of breath. Negative for cough and wheezing.   Cardiovascular:  Negative for chest pain, palpitations, orthopnea and leg swelling.  Gastrointestinal:  Positive for heartburn. Negative for abdominal pain, blood in stool, constipation, diarrhea, nausea and vomiting.  Genitourinary:  Negative for dysuria.  Musculoskeletal:  Negative for falls and joint pain.  Skin: Negative.   Neurological:   Negative for dizziness, weakness and headaches.  Psychiatric/Behavioral:  Positive for memory loss. Negative for depression. The patient is not nervous/anxious.    Past Medical History:  Diagnosis Date   Arthritis    Choledocholithiasis    Cirrhosis, cryptogenic (HCC)    FOLLOWED BY DR PYRTLE-- LOV  IN EPIC--  STABLE PER NOTE   Common bile duct stone    Cryptogenic cirrhosis (HCC)    DDD (degenerative disc disease), lumbar    Diverticulosis    Esophageal varix (HCC)    GERD (gastroesophageal reflux disease)    Hiatal hernia    History of colonoscopy with polypectomy    ADENOMATOUS AND HYPERPLASIA POLYPS  (2008  &  2010)   History of hepatitis C    History of positive PPD    + TB SKIN TEST WITHOUT TB   Hyperlipidemia    Hypertension, portal (HCC)    SECONDARY TO CRYPTOGENIC CIRRHOSIS   Internal hemorrhoids    Iron deficiency anemia    Loss of hearing    right ear   Osteoarthritis    hands, knees, and low back   Osteoporosis    Portal hypertension (HCC)    PUD (peptic ulcer disease)    Squamous cell carcinoma of skin 04/07/2017   left cheek   Tubular adenoma of colon    Uterovaginal prolapse, incomplete    Wears glasses    Wears hearing aid    left ear only   Past Surgical History:  Procedure Laterality Date  APPENDECTOMY  1960'S   CHOLECYSTECTOMY  08/25/2011   Procedure: LAPAROSCOPIC CHOLECYSTECTOMY WITH INTRAOPERATIVE CHOLANGIOGRAM;  Surgeon: Zenovia Jarred, MD;  Location: [place];  Service: General;  Laterality: N/A;   ENDOSCOPIC RETROGRADE CHOLANGIOPANCREATOGRAPHY (ERCP) WITH PROPOFOL N/A 11/30/2012   Procedure: ENDOSCOPIC RETROGRADE CHOLANGIOPANCREATOGRAPHY (ERCP) WITH PROPOFOL;  Surgeon: Milus Banister, MD;  Location: [facility];  Service: Endoscopy;  Laterality: N/A;   ESOPHAGOGASTRODUODENOSCOPY N/A 05/18/2018   Procedure: ESOPHAGOGASTRODUODENOSCOPY (EGD);  Surgeon: Lin Landsman, MD;  Location: [facility] ENDOSCOPY;  Service: Gastroenterology;  Laterality: N/A;    EXTERNAL EAR SURGERY Left 1980   HYSTEROSCOPY WITH D & C N/A 07/15/2014   Procedure: DILATATION AND CURETTAGE /HYSTEROSCOPY;  Surgeon: Molli Posey, MD;  Location: [place];  Service: Gynecology;  Laterality: N/A;   INTRAMEDULLARY (IM) NAIL INTERTROCHANTERIC Left 01/22/2018   Procedure: ORIF LEFT INTERTROCHANTRIC HIP FX;  Surgeon: Mcarthur Rossetti, MD;  Location: [place];  Service: Orthopedics;  Laterality: Left;   KNEE ARTHROSCOPY W/ MENISCECTOMY Right 06-19-2002   LUMBAR [place] SURGERY  1990   PACEMAKER IMPLANT N/A 12/16/2020   Procedure: PACEMAKER IMPLANT;  Surgeon: Thompson Grayer, MD;  Location: [place] [facility];  Service: Cardiovascular;  Laterality: N/A;   REMOVAL OF URINARY SLING N/A 09/13/2013   Procedure: REMOVAL OF RETAINED PESSARY;  Surgeon: Jorja Loa, MD;  Location: [facility];  Service: Urology;  Laterality: N/A;   Social History:   reports that she has never smoked. She has never used smokeless tobacco. She reports that she does not drink alcohol and does not use drugs.  Family History  Problem Relation Age of Onset   Heart disease Mother    Heart disease Father 47   Hyperlipidemia Father    Diabetes Father    Cirrhosis Sister    Diabetes Brother    Heart disease Brother    Cirrhosis Brother    Pancreatic cancer Brother    Colon cancer Neg Hx    Colon polyps Neg Hx    Esophageal cancer Neg Hx    Stomach cancer Neg Hx    Inflammatory bowel disease Neg Hx     Medications: Patient's Medications  New Prescriptions   No medications on file  Previous Medications   CALCIUM-PHOSPHORUS-VITAMIN D (CALCIUM GUMMIES PO)    Take 2 tablets by mouth daily.   CETIRIZINE (ZYRTEC) 10 MG TABLET    Take 10 mg by mouth daily as needed for allergies.   CHOLECALCIFEROL (VITAMIN D3) 50 MCG (2000 UT) CAPS    Take 1 capsule by mouth daily in the afternoon.   DENOSUMAB (PROLIA) 60 MG/ML SOSY INJECTION    Inject 60 mg into the skin every 6  (six) months.   IRON, FERROUS SULFATE, 325 (65 FE) MG TABS    Take 325 mg by mouth every other day.   MEMANTINE (NAMENDA) 10 MG TABLET    TAKE 1 TABLET BY MOUTH TWICE A DAY   PANTOPRAZOLE (PROTONIX) 40 MG TABLET    TAKE 1 TABLET BY MOUTH DAILY BEFORE BREAKFAST   SENNOSIDES-DOCUSATE SODIUM (SENOKOT-S) 8.6-50 MG TABLET    Take 1 tablet by mouth daily as needed for constipation.   TIMOLOL (TIMOPTIC) 0.5 % OPHTHALMIC SOLUTION    Place 1 drop into both eyes 2 (two) times daily.    TRAMADOL (ULTRAM) 50 MG TABLET    Take 1 tablet (50 mg total) by mouth every 6 (six) hours as needed for moderate pain.   TRAZODONE (DESYREL) 100 MG TABLET  Take 1 tablet (100 mg total) by mouth at bedtime.   VITAMIN E 400 UNIT CAPSULE    Take 400 Units by mouth daily.  Modified Medications   No medications on file  Discontinued Medications   No medications on file    Physical Exam:  There were no vitals filed for this visit. There is no height or weight on file to calculate BMI. Wt Readings from Last 3 Encounters:  04/17/21 110 lb 14.3 oz (50.3 kg)  03/24/21 111 lb (50.3 kg)  02/19/21 105 lb 12.8 oz (48 kg)    Physical Exam Vitals reviewed.  Constitutional:      General: She is not in acute distress. HENT:     Head: Normocephalic.  Eyes:     General:        Right eye: No discharge.        Left eye: No discharge.  Cardiovascular:     Rate and Rhythm: Normal rate and regular rhythm.     Pulses: Normal pulses.     Heart sounds: Normal heart sounds. No murmur heard. Pulmonary:     Effort: Pulmonary effort is normal. No respiratory distress.     Breath sounds: Normal breath sounds. No wheezing.  Abdominal:     General: Bowel sounds are normal. There is no distension.     Palpations: Abdomen is soft.     Tenderness: There is no abdominal tenderness.  Musculoskeletal:     Cervical back: Neck supple.     Right lower leg: No edema.     Left lower leg: No edema.  Skin:    General: Skin is warm and  dry.     Capillary Refill: Capillary refill takes less than 2 seconds.  Neurological:     General: No focal deficit present.     Mental Status: She is alert and oriented to person, place, and time.  Psychiatric:        Mood and Affect: Mood normal.        Behavior: Behavior normal.    Labs reviewed: Basic Metabolic Panel: Recent Labs    05/23/20 1004 08/21/20 1520 12/14/20 0928 12/15/20 1636 12/16/20 0301 04/17/21 2123  NA 142   < > 140  --  138 143  K 3.8   < > 3.5  --  3.7 3.3*  CL 105   < > 103  --  105 106  CO2 32   < > 28  --  26 30  GLUCOSE 83   < > 94  --  83 145*  BUN 12   < > 13  --  13 16  CREATININE 0.60   < > 0.56 0.83 0.87 0.73  CALCIUM 9.3   < > 8.2*  --  7.8* 10.2  TSH 2.54  --   --   --  1.930  --    < > = values in this interval not displayed.   Liver Function Tests: Recent Labs    05/23/20 1004 12/08/20 1536  AST 28 35  ALT 15 21  BILITOT 0.8 1.1  PROT 5.3* 5.6*   No results for input(s): LIPASE, AMYLASE in the last 8760 hours. No results for input(s): AMMONIA in the last 8760 hours. CBC: Recent Labs    10/08/20 1106 12/08/20 1536 12/14/20 0928 12/15/20 1636 04/17/21 2123  WBC 4.0 5.3 4.2 5.5 5.2  NEUTROABS 2,600 2,836  --   --  3.3  HGB 11.7 11.5* 11.9* 13.4 12.8  HCT 34.5* 34.6*  34.5* 39.3 39.6  MCV 91.0 93.3 92.7 91.4 95.2  PLT 148 127* 138* 159 108*   Lipid Panel: No results for input(s): CHOL, HDL, LDLCALC, TRIG, CHOLHDL, LDLDIRECT in the last 8760 hours. TSH: Recent Labs    05/23/20 1004 12/16/20 0301  TSH 2.54 1.930   A1C: Lab Results  Component Value Date   HGBA1C 4.9 01/18/2014     Assessment/Plan 1. Hypokalemia - K+ 3.3 74/08 - Basic Metabolic Panel  2. Shortness of breath - improved - breathing unlabored, sats> 90% on room air  3. Gastroesophageal reflux disease without esophagitis - hgb 12.8 02/03 - reports increased heartburn in past week - given Maalox in ED - will start evening dose of Protonix 20  mg - pantoprazole (PROTONIX) 20 MG tablet; Take 1 tablet (20 mg total) by mouth daily before supper.  Dispense: 30 tablet; Refill: 0 - advised to avoid food triggers - refrain from eating a few hours prior to bedtime  4. Mobitz type 2 second degree heart block - s/p pacemaker 12/2020 - followed by Dr. Caryl Comes  5. Mild dementia without behavioral disturbance - son now lives with her - talking less - napping more during day - no behavioral outbursts - MMSE 25/30 05/2020 - MMSE- future   Total time: 34 minutes. Greater than 50% of total time spent doing patient education on symptom and medication management regarding heartburn, hypokalemia, blood pressure, and sob.   Tests/labs future: MMSE   Next appt: 06/25/2021  [name], [facility] [phone]

## 2021-05-15 ENCOUNTER — Encounter: Payer: Self-pay | Admitting: Orthopedic Surgery

## 2021-05-16 ENCOUNTER — Other Ambulatory Visit: Payer: Self-pay | Admitting: Orthopedic Surgery

## 2021-05-16 DIAGNOSIS — K219 Gastro-esophageal reflux disease without esophagitis: Secondary | ICD-10-CM

## 2021-05-19 ENCOUNTER — Other Ambulatory Visit: Payer: Self-pay | Admitting: Orthopedic Surgery

## 2021-05-19 DIAGNOSIS — G4700 Insomnia, unspecified: Secondary | ICD-10-CM

## 2021-05-19 NOTE — Telephone Encounter (Signed)
High risk warning populated when attempting to approve refill request. Will send to provider to review.  ? ?

## 2021-05-28 ENCOUNTER — Other Ambulatory Visit: Payer: Self-pay | Admitting: Orthopedic Surgery

## 2021-05-29 NOTE — Telephone Encounter (Signed)
Patient has request refill on medication "Pantoprazole '40mg'$ ". Patient last refill dated 05/19/2020. Patient medication has High Risk Warning. Medication pend and sent to PCP Yvonna Alanis, NP for approval.  ?

## 2021-06-18 ENCOUNTER — Ambulatory Visit (INDEPENDENT_AMBULATORY_CARE_PROVIDER_SITE_OTHER): Payer: Medicare Other

## 2021-06-18 DIAGNOSIS — Z950 Presence of cardiac pacemaker: Secondary | ICD-10-CM | POA: Diagnosis not present

## 2021-06-18 LAB — CUP PACEART REMOTE DEVICE CHECK
Battery Remaining Longevity: 104 mo
Battery Remaining Percentage: 95.5 %
Battery Voltage: 3.02 V
Brady Statistic AP VP Percent: 1 %
Brady Statistic AP VS Percent: 1 %
Brady Statistic AS VP Percent: 80 %
Brady Statistic AS VS Percent: 19 %
Brady Statistic RA Percent Paced: 1 %
Brady Statistic RV Percent Paced: 81 %
Date Time Interrogation Session: 20230406044909
Implantable Lead Implant Date: 20221004
Implantable Lead Implant Date: 20221004
Implantable Lead Location: 753859
Implantable Lead Location: 753860
Implantable Pulse Generator Implant Date: 20221004
Lead Channel Impedance Value: 360 Ohm
Lead Channel Impedance Value: 540 Ohm
Lead Channel Pacing Threshold Amplitude: 0.5 V
Lead Channel Pacing Threshold Amplitude: 0.75 V
Lead Channel Pacing Threshold Pulse Width: 0.5 ms
Lead Channel Pacing Threshold Pulse Width: 0.5 ms
Lead Channel Sensing Intrinsic Amplitude: 1.9 mV
Lead Channel Sensing Intrinsic Amplitude: 12 mV
Lead Channel Setting Pacing Amplitude: 2 V
Lead Channel Setting Pacing Amplitude: 2.5 V
Lead Channel Setting Pacing Pulse Width: 0.5 ms
Lead Channel Setting Sensing Sensitivity: 2 mV
Pulse Gen Model: 2272
Pulse Gen Serial Number: 3964696

## 2021-06-25 ENCOUNTER — Ambulatory Visit: Payer: Medicare Other | Admitting: Orthopedic Surgery

## 2021-07-03 NOTE — Progress Notes (Signed)
Remote pacemaker transmission.   

## 2021-07-09 ENCOUNTER — Other Ambulatory Visit: Payer: Self-pay | Admitting: Orthopedic Surgery

## 2021-07-09 DIAGNOSIS — M150 Primary generalized (osteo)arthritis: Secondary | ICD-10-CM

## 2021-07-09 DIAGNOSIS — M159 Polyosteoarthritis, unspecified: Secondary | ICD-10-CM

## 2021-07-09 NOTE — Telephone Encounter (Signed)
Rx signed (02/19/2021), 30/2 refill. No treatment agreement on file. Upcoming appointment on (09/03/21).  ? ?Please advise. ? ?

## 2021-07-23 ENCOUNTER — Ambulatory Visit: Payer: Medicare Other | Admitting: Orthopedic Surgery

## 2021-08-06 ENCOUNTER — Telehealth: Payer: Self-pay | Admitting: *Deleted

## 2021-08-06 NOTE — Telephone Encounter (Signed)
[employer] Prior Authorization for Prolia:  [id number] [place] [id number] Approved 08-06-2021 08-07-2022 [place], [name] [place], [place]

## 2021-08-24 ENCOUNTER — Ambulatory Visit: Payer: Medicare Other

## 2021-09-01 ENCOUNTER — Ambulatory Visit: Payer: Medicare Other

## 2021-09-03 ENCOUNTER — Ambulatory Visit (INDEPENDENT_AMBULATORY_CARE_PROVIDER_SITE_OTHER): Payer: Medicare Other | Admitting: Orthopedic Surgery

## 2021-09-03 ENCOUNTER — Encounter: Payer: Self-pay | Admitting: Orthopedic Surgery

## 2021-09-03 ENCOUNTER — Ambulatory Visit: Payer: Medicare Other

## 2021-09-03 VITALS — BP 136/70 | HR 66 | Temp 96.2°F | Resp 18 | Wt 111.8 lb

## 2021-09-03 DIAGNOSIS — L602 Onychogryphosis: Secondary | ICD-10-CM

## 2021-09-03 DIAGNOSIS — M159 Polyosteoarthritis, unspecified: Secondary | ICD-10-CM | POA: Diagnosis not present

## 2021-09-03 DIAGNOSIS — M810 Age-related osteoporosis without current pathological fracture: Secondary | ICD-10-CM | POA: Diagnosis not present

## 2021-09-03 DIAGNOSIS — I441 Atrioventricular block, second degree: Secondary | ICD-10-CM | POA: Diagnosis not present

## 2021-09-03 DIAGNOSIS — I10 Essential (primary) hypertension: Secondary | ICD-10-CM

## 2021-09-03 DIAGNOSIS — Z9109 Other allergy status, other than to drugs and biological substances: Secondary | ICD-10-CM | POA: Diagnosis not present

## 2021-09-03 DIAGNOSIS — F03A0 Unspecified dementia, mild, without behavioral disturbance, psychotic disturbance, mood disturbance, and anxiety: Secondary | ICD-10-CM | POA: Diagnosis not present

## 2021-09-03 MED ORDER — DENOSUMAB 60 MG/ML ~~LOC~~ SOSY
60.0000 mg | PREFILLED_SYRINGE | Freq: Once | SUBCUTANEOUS | Status: AC
  Administered 2021-09-03: 60 mg via SUBCUTANEOUS

## 2021-09-03 NOTE — Patient Instructions (Addendum)
Try taking zyrtec for 14 days to see if it helps with eye itching  Please schedule with podiatry to have left great toe looked at- Dr. Milinda Pointer  Bone density scheduled with [place]

## 2021-09-03 NOTE — Progress Notes (Addendum)
Careteam: Patient Care Team: Brianna Alanis, NP as PCP - General (Adult Health Nurse Practitioner) [name], MD as PCP - Cardiology (Cardiology)  Seen by: [name], AGNP-C  PLACE OF SERVICE:  [place] Directive information    No Known Allergies  Chief Complaint  Patient presents with   Medical Management of Chronic Issues    Patient returns to the office for 4 month follow up.      HPI: Patient is a 88 y.o. female seen today for medical management of chronic conditions.   Daughter present during encounter.   Left great toe thick and starting to hurt. No skin breakdown. She saw podiatry in 2019.   Hearing aids adjusted. Still having some trouble with hearing. Would like ears checked.   Lives with son and granddaughter.   No behavioral outbursts. Having trouble repeating herself after she just said it. Can still perform ADLs. Ambulates with cane. No recent falls.   Pacemaker check 06/18/2021.   Does not want mammogram anymore due to advanced age.   Received Prolia injection today. Plan to have bone density in next year.   Does not eat well. Drinks one Boost daily. Eats a lot of sweets. Weights stable.    Review of Systems:  Review of Systems  Constitutional:  Negative for chills, fever, malaise/fatigue and weight loss.  HENT:  Positive for hearing loss. Negative for sore throat.   Eyes:  Negative for discharge and redness.       Itching  Respiratory:  Negative for cough, shortness of breath and wheezing.   Cardiovascular:  Negative for chest pain and leg swelling.       Pacemaker  Gastrointestinal:  Negative for abdominal pain, constipation, diarrhea, heartburn, nausea and vomiting.  Genitourinary:  Negative for dysuria and frequency.  Musculoskeletal:  Negative for falls and joint pain.  Skin:  Negative for rash.  Neurological:  Positive for weakness. Negative for dizziness and headaches.  Psychiatric/Behavioral:  Positive for memory  loss. Negative for depression. The patient is not nervous/anxious.     Past Medical History:  Diagnosis Date   Arthritis    Choledocholithiasis    Cirrhosis, cryptogenic (HCC)    FOLLOWED BY DR PYRTLE-- LOV  IN EPIC--  STABLE PER NOTE   Common bile duct stone    Cryptogenic cirrhosis (HCC)    DDD (degenerative disc disease), lumbar    Diverticulosis    Esophageal varix (HCC)    GERD (gastroesophageal reflux disease)    Hiatal hernia    History of colonoscopy with polypectomy    ADENOMATOUS AND HYPERPLASIA POLYPS  (2008  &  2010)   History of hepatitis C    History of positive PPD    + TB SKIN TEST WITHOUT TB   Hyperlipidemia    Hypertension, portal (HCC)    SECONDARY TO CRYPTOGENIC CIRRHOSIS   Internal hemorrhoids    Iron deficiency anemia    Loss of hearing    right ear   Osteoarthritis    hands, knees, and low back   Osteoporosis    Portal hypertension (HCC)    PUD (peptic ulcer disease)    Squamous cell carcinoma of skin 04/07/2017   left cheek   Tubular adenoma of colon    Uterovaginal prolapse, incomplete    Wears glasses    Wears hearing aid    left ear only   Past Surgical History:  Procedure Laterality Date   APPENDECTOMY  1960'S   CHOLECYSTECTOMY  08/25/2011   Procedure: LAPAROSCOPIC CHOLECYSTECTOMY WITH INTRAOPERATIVE CHOLANGIOGRAM;  Surgeon: Zenovia Jarred, MD;  Location: [place];  Service: General;  Laterality: N/A;   ENDOSCOPIC RETROGRADE CHOLANGIOPANCREATOGRAPHY (ERCP) WITH PROPOFOL N/A 11/30/2012   Procedure: ENDOSCOPIC RETROGRADE CHOLANGIOPANCREATOGRAPHY (ERCP) WITH PROPOFOL;  Surgeon: Milus Banister, MD;  Location[facility];  Service: Endoscopy;  Laterality: N/A;   ESOPHAGOGASTRODUODENOSCOPY N/A 05/18/2018   Procedure: ESOPHAGOGASTRODUODENOSCOPY (EGD);  Surgeon: Lin Landsman, MD;  Location: [facility] ENDOSCOPY;  Service: Gastroenterology;  Laterality: N/A;   EXTERNAL EAR SURGERY Left 1980   HYSTEROSCOPY WITH D & C N/A 07/15/2014   Procedure:  DILATATION AND CURETTAGE /HYSTEROSCOPY;  Surgeon: Molli Posey, MD;  Location: [place];  Service: Gynecology;  Laterality: N/A;   INTRAMEDULLARY (IM) NAIL INTERTROCHANTERIC Left 01/22/2018   Procedure: ORIF LEFT INTERTROCHANTRIC HIP FX;  Surgeon: Mcarthur Rossetti, MD;  Location: [place];  Service: Orthopedics;  Laterality: Left;   KNEE ARTHROSCOPY W/ MENISCECTOMY Right 06-19-2002   LUMBAR [place] SURGERY  1990   PACEMAKER IMPLANT N/A 12/16/2020   Procedure: PACEMAKER IMPLANT;  Surgeon: Thompson Grayer, MD;  Location: [place] [facility];  Service: Cardiovascular;  Laterality: N/A;   REMOVAL OF URINARY SLING N/A 09/13/2013   Procedure: REMOVAL OF RETAINED PESSARY;  Surgeon: Jorja Loa, MD;  Location: [facility];  Service: Urology;  Laterality: N/A;   Social History:   reports that she has never smoked. She has never used smokeless tobacco. She reports that she does not drink alcohol and does not use drugs.  Family History  Problem Relation Age of Onset   Heart disease Mother    Heart disease Father 80   Hyperlipidemia Father    Diabetes Father    Cirrhosis Sister    Diabetes Brother    Heart disease Brother    Cirrhosis Brother    Pancreatic cancer Brother    Colon cancer Neg Hx    Colon polyps Neg Hx    Esophageal cancer Neg Hx    Stomach cancer Neg Hx    Inflammatory bowel disease Neg Hx     Medications: Patient's Medications  New Prescriptions   No medications on file  Previous Medications   CALCIUM-PHOSPHORUS-VITAMIN D (CALCIUM GUMMIES PO)    Take 2 tablets by mouth daily.   CETIRIZINE (ZYRTEC) 10 MG TABLET    Take 10 mg by mouth daily as needed for allergies.   CHOLECALCIFEROL (VITAMIN D3) 50 MCG (2000 UT) CAPS    Take 1 capsule by mouth daily in the afternoon.   DENOSUMAB (PROLIA) 60 MG/ML SOSY INJECTION    Inject 60 mg into the skin every 6 (six) months.   IRON, FERROUS SULFATE, 325 (65 FE) MG TABS    Take 325 mg by mouth every  other day.   MEMANTINE (NAMENDA) 10 MG TABLET    TAKE 1 TABLET BY MOUTH TWICE A DAY   PANTOPRAZOLE (PROTONIX) 20 MG TABLET    TAKE 1 TABLET (20 MG TOTAL) BY MOUTH DAILY BEFORE SUPPER.   PANTOPRAZOLE (PROTONIX) 40 MG TABLET    TAKE 1 TABLET BY MOUTH EVERY DAY BEFORE BREAKFAST   SENNOSIDES-DOCUSATE SODIUM (SENOKOT-S) 8.6-50 MG TABLET    Take 1 tablet by mouth daily as needed for constipation.   TIMOLOL (TIMOPTIC) 0.5 % OPHTHALMIC SOLUTION    Place 1 drop into both eyes 2 (two) times daily.    TRAMADOL (ULTRAM) 50 MG TABLET    TAKE 1 TABLET (50 MG TOTAL) BY MOUTH EVERY 6 (  SIX) HOURS AS NEEDED FOR MODERATE PAIN   TRAZODONE (DESYREL) 100 MG TABLET    TAKE 1 TABLET BY MOUTH EVERYDAY AT BEDTIME   VITAMIN E 400 UNIT CAPSULE    Take 400 Units by mouth daily.  Modified Medications   No medications on file  Discontinued Medications   No medications on file    Physical Exam:  There were no vitals filed for this visit. There is no height or weight on file to calculate BMI. Wt Readings from Last 3 Encounters:  04/23/21 111 lb 3.2 oz (50.4 kg)  04/17/21 110 lb 14.3 oz (50.3 kg)  03/24/21 111 lb (50.3 kg)    Physical Exam Vitals reviewed.  HENT:     Head: Normocephalic.     Right Ear: There is impacted cerumen.     Left Ear: There is no impacted cerumen.     Nose: Nose normal.     Mouth/Throat:     Mouth: Mucous membranes are moist.  Eyes:     General:        Right eye: No discharge.        Left eye: No discharge.  Cardiovascular:     Rate and Rhythm: Normal rate and regular rhythm.     Pulses: Normal pulses.     Heart sounds: Normal heart sounds.  Pulmonary:     Effort: Pulmonary effort is normal. No respiratory distress.     Breath sounds: Normal breath sounds. No wheezing.  Abdominal:     General: Bowel sounds are normal. There is no distension.     Palpations: Abdomen is soft.     Tenderness: There is no abdominal tenderness.  Musculoskeletal:     Cervical back: Neck supple.      Right lower leg: No edema.     Left lower leg: No edema.  Feet:     Right foot:     Skin integrity: Erythema present.     Toenail Condition: Right toenails are abnormally thick and long. Fungal disease present.    Left foot:     Skin integrity: Erythema present.     Toenail Condition: Left toenails are abnormally thick and ingrown. Fungal disease present. Skin:    General: Skin is warm and dry.     Capillary Refill: Capillary refill takes less than 2 seconds.  Neurological:     General: No focal deficit present.     Mental Status: She is alert. Mental status is at baseline.     Motor: Weakness present.     Gait: Gait abnormal.     Comments: cane  Psychiatric:        Mood and Affect: Mood normal.        Behavior: Behavior normal.     Labs reviewed: Basic Metabolic Panel: Recent Labs    12/16/20 0301 04/17/21 2123 04/23/21 0937  NA 138 143 143  K 3.7 3.3* 3.6  CL 105 106 109  CO2 '26 30 30  '$ GLUCOSE 83 145* 84  BUN '13 16 14  '$ CREATININE 0.87 0.73 0.61  CALCIUM 7.8* 10.2 8.3*  TSH 1.930  --   --    Liver Function Tests: Recent Labs    12/08/20 1536  AST 35  ALT 21  BILITOT 1.1  PROT 5.6*   No results for input(s): "LIPASE", "AMYLASE" in the last 8760 hours. No results for input(s): "AMMONIA" in the last 8760 hours. CBC: Recent Labs    10/08/20 1106 12/08/20 1536 12/14/20 0928 12/15/20 1636 04/17/21 2123  WBC 4.0 5.3 4.2 5.5 5.2  NEUTROABS 2,600 2,836  --   --  3.3  HGB 11.7 11.5* 11.9* 13.4 12.8  HCT 34.5* 34.6* 34.5* 39.3 39.6  MCV 91.0 93.3 92.7 91.4 95.2  PLT 148 127* 138* 159 108*   Lipid Panel: No results for input(s): "CHOL", "HDL", "LDLCALC", "TRIG", "CHOLHDL", "LDLDIRECT" in the last 8760 hours. TSH: Recent Labs    12/16/20 0301  TSH 1.930   A1C: Lab Results  Component Value Date   HGBA1C 4.9 01/18/2014     Assessment/Plan 1. Mild dementia without behavioral disturbance, psychotic disturbance, mood disturbance, or anxiety,  unspecified dementia type ([place]) - no behavioral outbursts - can still perform ADLs - cont Namenda  2. Essential hypertension - controlled without medication - CBC with Differential/Platelet - CMP  3. Age-related osteoporosis without current pathological fracture - DEXA ordered - cont Prolia- started 06/2019 - denosumab (PROLIA) injection 60 mg - DG Bone Density; Future  4. Mobitz type 2 second degree AV block - s/p pacemaker- last check 04/06  5. Onychauxis - left great toe thick and yellow - skin to second toe compromised - advised to f/u with podiatry  6. Environmental allergies - eye itching x 2 weeks - recommend trial of zyrtec x 14 days  Total time: 31 minutes. Greater than 50% of total time spent doing patient education regarding health maintenance, bone health, dementia, appetite, falls safety.    Next appt: 02/11/2022  Niel Hummer  [facility] [phone]

## 2021-09-04 ENCOUNTER — Other Ambulatory Visit: Payer: Self-pay | Admitting: Orthopedic Surgery

## 2021-09-04 DIAGNOSIS — R748 Abnormal levels of other serum enzymes: Secondary | ICD-10-CM

## 2021-09-04 LAB — COMPREHENSIVE METABOLIC PANEL
AG Ratio: 1.5 (calc) (ref 1.0–2.5)
ALT: 24 U/L (ref 6–29)
AST: 49 U/L — ABNORMAL HIGH (ref 10–35)
Albumin: 3.1 g/dL — ABNORMAL LOW (ref 3.6–5.1)
Alkaline phosphatase (APISO): 105 U/L (ref 37–153)
BUN: 9 mg/dL (ref 7–25)
CO2: 27 mmol/L (ref 20–32)
Calcium: 8.5 mg/dL — ABNORMAL LOW (ref 8.6–10.4)
Chloride: 108 mmol/L (ref 98–110)
Creat: 0.66 mg/dL (ref 0.60–0.95)
Globulin: 2.1 g/dL (calc) (ref 1.9–3.7)
Glucose, Bld: 93 mg/dL (ref 65–99)
Potassium: 4 mmol/L (ref 3.5–5.3)
Sodium: 142 mmol/L (ref 135–146)
Total Bilirubin: 1.1 mg/dL (ref 0.2–1.2)
Total Protein: 5.2 g/dL — ABNORMAL LOW (ref 6.1–8.1)

## 2021-09-04 LAB — CBC WITH DIFFERENTIAL/PLATELET
Absolute Monocytes: 444 cells/uL (ref 200–950)
Basophils Absolute: 20 cells/uL (ref 0–200)
Basophils Relative: 0.5 %
Eosinophils Absolute: 152 cells/uL (ref 15–500)
Eosinophils Relative: 3.8 %
HCT: 36.8 % (ref 35.0–45.0)
Hemoglobin: 12.6 g/dL (ref 11.7–15.5)
Lymphs Abs: 852 cells/uL (ref 850–3900)
MCH: 31.3 pg (ref 27.0–33.0)
MCHC: 34.2 g/dL (ref 32.0–36.0)
MCV: 91.5 fL (ref 80.0–100.0)
MPV: 11.4 fL (ref 7.5–12.5)
Monocytes Relative: 11.1 %
Neutro Abs: 2532 cells/uL (ref 1500–7800)
Neutrophils Relative %: 63.3 %
Platelets: 113 10*3/uL — ABNORMAL LOW (ref 140–400)
RBC: 4.02 10*6/uL (ref 3.80–5.10)
RDW: 12.6 % (ref 11.0–15.0)
Total Lymphocyte: 21.3 %
WBC: 4 10*3/uL (ref 3.8–10.8)

## 2021-09-16 ENCOUNTER — Encounter: Payer: Self-pay | Admitting: Podiatry

## 2021-09-16 ENCOUNTER — Ambulatory Visit: Payer: Medicare Other | Admitting: Podiatry

## 2021-09-16 DIAGNOSIS — B351 Tinea unguium: Secondary | ICD-10-CM

## 2021-09-16 DIAGNOSIS — M79676 Pain in unspecified toe(s): Secondary | ICD-10-CM | POA: Diagnosis not present

## 2021-09-16 NOTE — Progress Notes (Signed)
Subjective:  Patient ID: Brianna Barnes, female    DOB: 23-Jul-1928,  MRN: [id number] HPI Chief Complaint  Patient presents with   Nail Problem    Hallux nail left - thick and discolored x months, rubbing against the 2nd toe, using toe spacer   New Patient (Initial Visit)    Est pt 2019    88 y.o. female presents with the above complaint.   ROS: Denies fever chills nausea vomiting muscle aches pains calf pain back pain chest pain shortness of breath.  Past Medical History:  Diagnosis Date   Arthritis    Choledocholithiasis    Cirrhosis, cryptogenic (HCC)    FOLLOWED BY DR PYRTLE-- LOV  IN EPIC--  STABLE PER NOTE   Common bile duct stone    Cryptogenic cirrhosis (HCC)    DDD (degenerative disc disease), lumbar    Diverticulosis    Esophageal varix (HCC)    GERD (gastroesophageal reflux disease)    Hiatal hernia    History of colonoscopy with polypectomy    ADENOMATOUS AND HYPERPLASIA POLYPS  (2008  &  2010)   History of hepatitis C    History of positive PPD    + TB SKIN TEST WITHOUT TB   Hyperlipidemia    Hypertension, portal (HCC)    SECONDARY TO CRYPTOGENIC CIRRHOSIS   Internal hemorrhoids    Iron deficiency anemia    Loss of hearing    right ear   Osteoarthritis    hands, knees, and low back   Osteoporosis    Portal hypertension (HCC)    PUD (peptic ulcer disease)    Squamous cell carcinoma of skin 04/07/2017   left cheek   Tubular adenoma of colon    Uterovaginal prolapse, incomplete    Wears glasses    Wears hearing aid    left ear only   Past Surgical History:  Procedure Laterality Date   APPENDECTOMY  1960'S   CHOLECYSTECTOMY  08/25/2011   Procedure: LAPAROSCOPIC CHOLECYSTECTOMY WITH INTRAOPERATIVE CHOLANGIOGRAM;  Surgeon: Zenovia Jarred, MD;  Location: [place];  Service: General;  Laterality: N/A;   ENDOSCOPIC RETROGRADE CHOLANGIOPANCREATOGRAPHY (ERCP) WITH PROPOFOL N/A 11/30/2012   Procedure: ENDOSCOPIC RETROGRADE CHOLANGIOPANCREATOGRAPHY  (ERCP) WITH PROPOFOL;  Surgeon: Milus Banister, MD;  Location: [facility];  Service: Endoscopy;  Laterality: N/A;   ESOPHAGOGASTRODUODENOSCOPY N/A 05/18/2018   Procedure: ESOPHAGOGASTRODUODENOSCOPY (EGD);  Surgeon: Lin Landsman, MD;  Location: [facility] ENDOSCOPY;  Service: Gastroenterology;  Laterality: N/A;   EXTERNAL EAR SURGERY Left 1980   HYSTEROSCOPY WITH D & C N/A 07/15/2014   Procedure: DILATATION AND CURETTAGE /HYSTEROSCOPY;  Surgeon: Molli Posey, MD;  Location: [place];  Service: Gynecology;  Laterality: N/A;   INTRAMEDULLARY (IM) NAIL INTERTROCHANTERIC Left 01/22/2018   Procedure: ORIF LEFT INTERTROCHANTRIC HIP FX;  Surgeon: Mcarthur Rossetti, MD;  Location: [place];  Service: Orthopedics;  Laterality: Left;   KNEE ARTHROSCOPY W/ MENISCECTOMY Right 06-19-2002   LUMBAR [place] SURGERY  1990   PACEMAKER IMPLANT N/A 12/16/2020   Procedure: PACEMAKER IMPLANT;  Surgeon: Thompson Grayer, MD;  Location: [place] CV LAB;  Service: Cardiovascular;  Laterality: N/A;   REMOVAL OF URINARY SLING N/A 09/13/2013   Procedure: REMOVAL OF RETAINED PESSARY;  Surgeon: Jorja Loa, MD;  Location: [facility];  Service: Urology;  Laterality: N/A;    Current Outpatient Medications:    Calcium-Phosphorus-Vitamin D (CALCIUM GUMMIES PO), Take 2 tablets by mouth daily., Disp: , Rfl:    cetirizine (ZYRTEC) 10 MG tablet,  Take 10 mg by mouth daily as needed for allergies., Disp: , Rfl:    Cholecalciferol (VITAMIN D3) 50 MCG (2000 UT) CAPS, Take 1 capsule by mouth daily in the afternoon., Disp: , Rfl:    denosumab (PROLIA) 60 MG/ML SOSY injection, Inject 60 mg into the skin every 6 (six) months., Disp: , Rfl:    Iron, Ferrous Sulfate, 325 (65 Fe) MG TABS, Take 325 mg by mouth every other day., Disp: 30 tablet, Rfl: 6   memantine (NAMENDA) 10 MG tablet, TAKE 1 TABLET BY MOUTH TWICE A DAY, Disp: 180 tablet, Rfl: 4   pantoprazole (PROTONIX) 20 MG tablet, TAKE 1 TABLET  (20 MG TOTAL) BY MOUTH DAILY BEFORE SUPPER., Disp: 90 tablet, Rfl: 1   pantoprazole (PROTONIX) 40 MG tablet, TAKE 1 TABLET BY MOUTH EVERY DAY BEFORE BREAKFAST, Disp: 90 tablet, Rfl: 3   sennosides-docusate sodium (SENOKOT-S) 8.6-50 MG tablet, Take 1 tablet by mouth daily as needed for constipation., Disp: , Rfl:    timolol (TIMOPTIC) 0.5 % ophthalmic solution, Place 1 drop into both eyes 2 (two) times daily. , Disp: , Rfl:    traMADol (ULTRAM) 50 MG tablet, TAKE 1 TABLET (50 MG TOTAL) BY MOUTH EVERY 6 (SIX) HOURS AS NEEDED FOR MODERATE PAIN, Disp: 30 tablet, Rfl: 2   traZODone (DESYREL) 100 MG tablet, TAKE 1 TABLET BY MOUTH EVERYDAY AT BEDTIME, Disp: 90 tablet, Rfl: 1   vitamin E 400 UNIT capsule, Take 400 Units by mouth daily., Disp: , Rfl:   No Known Allergies Review of Systems Objective:  There were no vitals filed for this visit.  General: Well developed, nourished, in no acute distress, alert and oriented x3   Dermatological: Skin is warm, dry and supple bilateral. Nails x 10 are thick yellow dystrophic-like mycotic hallux nail left is severely mycotic with distal onycholysis and hypertrophic.  Remaining integument appears unremarkable at this time. There are no open sores, no preulcerative lesions, no rash or signs of infection present.  Vascular: Dorsalis Pedis artery and Posterior Tibial artery pedal pulses are 2/4 bilateral with immedate capillary fill time. Pedal hair growth present. No varicosities and no lower extremity edema present bilateral.   Neruologic: Grossly intact via light touch bilateral. Vibratory intact via tuning fork bilateral. Protective threshold with Semmes Wienstein monofilament intact to all pedal sites bilateral. Patellar and Achilles deep tendon reflexes 2+ bilateral. No Babinski or clonus noted bilateral.   Musculoskeletal: No gross boney pedal deformities bilateral. No pain, crepitus, or limitation noted with foot and ankle range of motion bilateral. Muscular  strength 5/5 in all groups tested bilateral.  Gait: Unassisted, Nonantalgic.    Radiographs:  None taken  Assessment & Plan:   Assessment: Pain in limb secondary to onychomycosis.  Plan: Debridement of nails 1 through 5 bilateral covered service secondary to pain follow-up with me in 3 months or as needed     Jance Siek T. [place], [place]

## 2021-09-17 ENCOUNTER — Ambulatory Visit (INDEPENDENT_AMBULATORY_CARE_PROVIDER_SITE_OTHER): Payer: Medicare Other

## 2021-09-17 DIAGNOSIS — I441 Atrioventricular block, second degree: Secondary | ICD-10-CM | POA: Diagnosis not present

## 2021-09-17 LAB — CUP PACEART REMOTE DEVICE CHECK
Battery Remaining Longevity: 100 mo
Battery Remaining Percentage: 95 %
Battery Voltage: 3.01 V
Brady Statistic AP VP Percent: 1 %
Brady Statistic AP VS Percent: 1 %
Brady Statistic AS VP Percent: 90 %
Brady Statistic AS VS Percent: 9.3 %
Brady Statistic RA Percent Paced: 1 %
Brady Statistic RV Percent Paced: 91 %
Date Time Interrogation Session: 20230706040015
Implantable Lead Implant Date: 20221004
Implantable Lead Implant Date: 20221004
Implantable Lead Location: 753859
Implantable Lead Location: 753860
Implantable Pulse Generator Implant Date: 20221004
Lead Channel Impedance Value: 380 Ohm
Lead Channel Impedance Value: 540 Ohm
Lead Channel Pacing Threshold Amplitude: 0.5 V
Lead Channel Pacing Threshold Amplitude: 0.75 V
Lead Channel Pacing Threshold Pulse Width: 0.5 ms
Lead Channel Pacing Threshold Pulse Width: 0.5 ms
Lead Channel Sensing Intrinsic Amplitude: 1.9 mV
Lead Channel Sensing Intrinsic Amplitude: 12 mV
Lead Channel Setting Pacing Amplitude: 2 V
Lead Channel Setting Pacing Amplitude: 2.5 V
Lead Channel Setting Pacing Pulse Width: 0.5 ms
Lead Channel Setting Sensing Sensitivity: 2 mV
Pulse Gen Model: 2272
Pulse Gen Serial Number: 3964696

## 2021-09-25 ENCOUNTER — Other Ambulatory Visit: Payer: Medicare Other

## 2021-09-29 ENCOUNTER — Other Ambulatory Visit: Payer: Medicare Other

## 2021-09-29 ENCOUNTER — Other Ambulatory Visit: Payer: Self-pay

## 2021-09-29 DIAGNOSIS — R748 Abnormal levels of other serum enzymes: Secondary | ICD-10-CM | POA: Diagnosis not present

## 2021-09-29 LAB — HEPATIC FUNCTION PANEL
AG Ratio: 1.5 (calc) (ref 1.0–2.5)
ALT: 21 U/L (ref 6–29)
AST: 43 U/L — ABNORMAL HIGH (ref 10–35)
Albumin: 3.1 g/dL — ABNORMAL LOW (ref 3.6–5.1)
Alkaline phosphatase (APISO): 112 U/L (ref 37–153)
Bilirubin, Direct: 0.3 mg/dL — ABNORMAL HIGH (ref 0.0–0.2)
Globulin: 2.1 g/dL (calc) (ref 1.9–3.7)
Indirect Bilirubin: 1.2 mg/dL (calc) (ref 0.2–1.2)
Total Bilirubin: 1.5 mg/dL — ABNORMAL HIGH (ref 0.2–1.2)
Total Protein: 5.2 g/dL — ABNORMAL LOW (ref 6.1–8.1)

## 2021-10-05 NOTE — Progress Notes (Signed)
Remote pacemaker transmission.   

## 2021-10-19 DIAGNOSIS — H401132 Primary open-angle glaucoma, bilateral, moderate stage: Secondary | ICD-10-CM | POA: Diagnosis not present

## 2021-11-09 ENCOUNTER — Other Ambulatory Visit: Payer: Self-pay | Admitting: Orthopedic Surgery

## 2021-11-09 DIAGNOSIS — G4700 Insomnia, unspecified: Secondary | ICD-10-CM

## 2021-11-09 DIAGNOSIS — N8111 Cystocele, midline: Secondary | ICD-10-CM | POA: Diagnosis not present

## 2021-11-09 DIAGNOSIS — K219 Gastro-esophageal reflux disease without esophagitis: Secondary | ICD-10-CM

## 2021-11-10 NOTE — Telephone Encounter (Signed)
Patient has request refill on medication Pantoprazole '20mg'$ , and Trazodone '100mg'$ . Patient medications have many High Risk Warnings. Medications pend and sent to PCP [place], [name] E, NPo for approval. Please Advise.

## 2021-12-17 ENCOUNTER — Ambulatory Visit: Payer: Medicare Other

## 2021-12-29 ENCOUNTER — Ambulatory Visit (INDEPENDENT_AMBULATORY_CARE_PROVIDER_SITE_OTHER): Payer: Medicare Other

## 2021-12-29 DIAGNOSIS — I441 Atrioventricular block, second degree: Secondary | ICD-10-CM | POA: Diagnosis not present

## 2021-12-30 LAB — CUP PACEART REMOTE DEVICE CHECK
Battery Remaining Longevity: 97 mo
Battery Remaining Longevity: 97 mo
Battery Remaining Percentage: 92 %
Battery Remaining Percentage: 92 %
Battery Voltage: 3.01 V
Battery Voltage: 3.01 V
Brady Statistic AP VP Percent: 1.9 %
Brady Statistic AP VP Percent: 1.9 %
Brady Statistic AP VS Percent: 1 %
Brady Statistic AP VS Percent: 1 %
Brady Statistic AS VP Percent: 92 %
Brady Statistic AS VP Percent: 92 %
Brady Statistic AS VS Percent: 6.1 %
Brady Statistic AS VS Percent: 6.1 %
Brady Statistic RA Percent Paced: 2 %
Brady Statistic RA Percent Paced: 2 %
Brady Statistic RV Percent Paced: 94 %
Brady Statistic RV Percent Paced: 94 %
Date Time Interrogation Session: 20231005040012
Date Time Interrogation Session: 20231005040012
Implantable Lead Implant Date: 20221004
Implantable Lead Implant Date: 20221004
Implantable Lead Implant Date: 20221004
Implantable Lead Implant Date: 20221004
Implantable Lead Location: 753859
Implantable Lead Location: 753860
Implantable Lead Location: 753859
Implantable Lead Location: 753860
Implantable Pulse Generator Implant Date: 20221004
Implantable Pulse Generator Implant Date: 20221004
Lead Channel Impedance Value: 360 Ohm
Lead Channel Impedance Value: 540 Ohm
Lead Channel Impedance Value: 360 Ohm
Lead Channel Impedance Value: 540 Ohm
Lead Channel Pacing Threshold Amplitude: 0.5 V
Lead Channel Pacing Threshold Amplitude: 0.75 V
Lead Channel Pacing Threshold Amplitude: 0.5 V
Lead Channel Pacing Threshold Amplitude: 0.75 V
Lead Channel Pacing Threshold Pulse Width: 0.5 ms
Lead Channel Pacing Threshold Pulse Width: 0.5 ms
Lead Channel Pacing Threshold Pulse Width: 0.5 ms
Lead Channel Pacing Threshold Pulse Width: 0.5 ms
Lead Channel Sensing Intrinsic Amplitude: 1.8 mV
Lead Channel Sensing Intrinsic Amplitude: 12 mV
Lead Channel Sensing Intrinsic Amplitude: 1.8 mV
Lead Channel Sensing Intrinsic Amplitude: 12 mV
Lead Channel Setting Pacing Amplitude: 2 V
Lead Channel Setting Pacing Amplitude: 2.5 V
Lead Channel Setting Pacing Amplitude: 2 V
Lead Channel Setting Pacing Amplitude: 2.5 V
Lead Channel Setting Pacing Pulse Width: 0.5 ms
Lead Channel Setting Pacing Pulse Width: 0.5 ms
Lead Channel Setting Sensing Sensitivity: 2 mV
Lead Channel Setting Sensing Sensitivity: 2 mV
Pulse Gen Model: 2272
Pulse Gen Model: 2272
Pulse Gen Serial Number: 3964696
Pulse Gen Serial Number: 3964696

## 2022-01-12 NOTE — Progress Notes (Signed)
Remote pacemaker transmission.   

## 2022-02-11 ENCOUNTER — Telehealth: Payer: Self-pay

## 2022-02-11 ENCOUNTER — Ambulatory Visit (INDEPENDENT_AMBULATORY_CARE_PROVIDER_SITE_OTHER): Payer: Medicare Other | Admitting: Orthopedic Surgery

## 2022-02-11 ENCOUNTER — Encounter: Payer: Self-pay | Admitting: Orthopedic Surgery

## 2022-02-11 VITALS — Ht <= 58 in | Wt 110.0 lb

## 2022-02-11 DIAGNOSIS — Z0000 Encounter for general adult medical examination without abnormal findings: Secondary | ICD-10-CM

## 2022-02-11 NOTE — Progress Notes (Signed)
Subjective:   Brianna Barnes is a 88 y.o. female who presents for Medicare Annual (Subsequent) preventive examination.  Place of Service: [place] Provider: [name], AGNP-C   Review of Systems     Cardiac Risk Factors include: hypertension;sedentary lifestyle     Objective:    Today's Vitals   02/11/22 1303  Weight: 110 lb (49.9 kg)  Height: '4\' 10"'$  (1.473 m)   Body mass index is 22.99 kg/m.     02/11/2022   11:20 AM 04/23/2021    8:18 AM 04/17/2021    8:58 PM 12/16/2020    6:00 PM 12/14/2020    9:39 AM 08/25/2020   12:13 PM 08/21/2020    2:31 PM  Advanced Directives  Does Patient Have a Medical Advance Directive? Yes Yes Yes No No Yes Yes  Type of Paramedic of [place];Living will Healthcare Power of [place];Living will [place];Living will  Does patient want to make changes to medical advance directive? No - Patient declined No - Patient declined     No - Patient declined  Copy of [place] in Chart? Yes - validated most recent copy scanned in chart (See row information) Yes - validated most recent copy scanned in chart (See row information)     Yes - validated most recent copy scanned in chart (See row information)    Current Medications (verified) Outpatient Encounter Medications as of 02/11/2022  Medication Sig   Calcium-Phosphorus-Vitamin D (CALCIUM GUMMIES PO) Take 2 tablets by mouth daily.   cetirizine (ZYRTEC) 10 MG tablet Take 10 mg by mouth daily as needed for allergies.   Cholecalciferol (VITAMIN D3) 50 MCG (2000 UT) CAPS Take 1 capsule by mouth daily in the afternoon.   denosumab (PROLIA) 60 MG/ML SOSY injection Inject 60 mg into the skin every 6 (six) months.   Iron, Ferrous Sulfate, 325 (65 Fe) MG TABS Take 325 mg by mouth every other day.   memantine (NAMENDA) 10 MG tablet TAKE 1 TABLET BY MOUTH TWICE A DAY    pantoprazole (PROTONIX) 20 MG tablet TAKE 1 TABLET (20 MG TOTAL) BY MOUTH DAILY BEFORE SUPPER.   pantoprazole (PROTONIX) 40 MG tablet TAKE 1 TABLET BY MOUTH EVERY DAY BEFORE BREAKFAST   sennosides-docusate sodium (SENOKOT-S) 8.6-50 MG tablet Take 1 tablet by mouth daily as needed for constipation.   timolol (TIMOPTIC) 0.5 % ophthalmic solution Place 1 drop into both eyes 2 (two) times daily.    traMADol (ULTRAM) 50 MG tablet TAKE 1 TABLET (50 MG TOTAL) BY MOUTH EVERY 6 (SIX) HOURS AS NEEDED FOR MODERATE PAIN   traZODone (DESYREL) 100 MG tablet TAKE 1 TABLET BY MOUTH EVERYDAY AT BEDTIME   vitamin E 400 UNIT capsule Take 400 Units by mouth daily.   No facility-administered encounter medications on file as of 02/11/2022.    Allergies (verified) Patient has no known allergies.   History: Past Medical History:  Diagnosis Date   Arthritis    Choledocholithiasis    Cirrhosis, cryptogenic (HCC)    FOLLOWED BY DR PYRTLE-- LOV  IN EPIC--  STABLE PER NOTE   Common bile duct stone    Cryptogenic cirrhosis (HCC)    DDD (degenerative disc disease), lumbar    Diverticulosis    Esophageal varix (HCC)    GERD (gastroesophageal reflux disease)    Hiatal hernia    History of colonoscopy with polypectomy    ADENOMATOUS AND  HYPERPLASIA POLYPS  (2008  &  2010)   History of hepatitis C    History of positive PPD    + TB SKIN TEST WITHOUT TB   Hyperlipidemia    Hypertension, portal (HCC)    SECONDARY TO CRYPTOGENIC CIRRHOSIS   Internal hemorrhoids    Iron deficiency anemia    Loss of hearing    right ear   Osteoarthritis    hands, knees, and low back   Osteoporosis    Portal hypertension (HCC)    PUD (peptic ulcer disease)    Squamous cell carcinoma of skin 04/07/2017   left cheek   Tubular adenoma of colon    Uterovaginal prolapse, incomplete    Wears glasses    Wears hearing aid    left ear only   Past Surgical History:  Procedure Laterality Date   APPENDECTOMY  1960'S    CHOLECYSTECTOMY  08/25/2011   Procedure: LAPAROSCOPIC CHOLECYSTECTOMY WITH INTRAOPERATIVE CHOLANGIOGRAM;  Surgeon: Zenovia Jarred, MD;  Location: [place];  Service: General;  Laterality: N/A;   ENDOSCOPIC RETROGRADE CHOLANGIOPANCREATOGRAPHY (ERCP) WITH PROPOFOL N/A 11/30/2012   Procedure: ENDOSCOPIC RETROGRADE CHOLANGIOPANCREATOGRAPHY (ERCP) WITH PROPOFOL;  Surgeon: Milus Banister, MD;  [facility];  Service: Endoscopy;  Laterality: N/A;   ESOPHAGOGASTRODUODENOSCOPY N/A 05/18/2018   Procedure: ESOPHAGOGASTRODUODENOSCOPY (EGD);  Surgeon: Lin Landsman, MD;  Location: [facility] ENDOSCOPY;  Service: Gastroenterology;  Laterality: N/A;   EXTERNAL EAR SURGERY Left 1980   HYSTEROSCOPY WITH D & C N/A 07/15/2014   Procedure: DILATATION AND CURETTAGE /HYSTEROSCOPY;  Surgeon: Molli Posey, MD;  Location: [place];  Service: Gynecology;  Laterality: N/A;   INTRAMEDULLARY (IM) NAIL INTERTROCHANTERIC Left 01/22/2018   Procedure: ORIF LEFT INTERTROCHANTRIC HIP FX;  Surgeon: Mcarthur Rossetti, MD;  Location: [place];  Service: Orthopedics;  Laterality: Left;   KNEE ARTHROSCOPY W/ MENISCECTOMY Right 06-19-2002   LUMBAR [place] SURGERY  1990   PACEMAKER IMPLANT N/A 12/16/2020   Procedure: PACEMAKER IMPLANT;  Surgeon: Thompson Grayer, MD;  Location: [place] [facility];  Service: Cardiovascular;  Laterality: N/A;   REMOVAL OF URINARY SLING N/A 09/13/2013   Procedure: REMOVAL OF RETAINED PESSARY;  Surgeon: Jorja Loa, MD;  Location: [facility];  Service: Urology;  Laterality: N/A;   Family History  Problem Relation Age of Onset   Heart disease Mother    Heart disease Father 90   Hyperlipidemia Father    Diabetes Father    Cirrhosis Sister    Diabetes Brother    Heart disease Brother    Cirrhosis Brother    Pancreatic cancer Brother    Colon cancer Neg Hx    Colon polyps Neg Hx    Esophageal cancer Neg Hx    Stomach cancer Neg Hx    Inflammatory bowel  disease Neg Hx    Social History   Socioeconomic History   Marital status: Widowed    Spouse name: Not on file   Number of children: 3   Years of education: Not on file   Highest education level: Not on file  Occupational History   Occupation: Retired, Licensed conveyancer  Tobacco Use   Smoking status: Never   Smokeless tobacco: Never  Vaping Use   Vaping Use: Never used  Substance and Sexual Activity   Alcohol use: No   Drug use: No   Sexual activity: Not Currently  Other Topics Concern   Not on file  Social History Narrative       Social  Determinants of Health   Financial Resource Strain: Low Risk  (02/11/2022)   Overall Financial Resource Strain (CARDIA)    Difficulty of Paying Living Expenses: Not hard at all  Food Insecurity: No Food Insecurity (02/11/2022)   Hunger Vital Sign    Worried About Running Out of Food in the Last Year: Never true    Ran Out of Food in the Last Year: Never true  Transportation Needs: No Transportation Needs (02/11/2022)   PRAPARE - Hydrologist (Medical): No    Lack of Transportation (Non-Medical): No  Physical Activity: Inactive (02/11/2022)   Exercise Vital Sign    Days of Exercise per Week: 0 days    Minutes of Exercise per Session: 0 min  Stress: No Stress Concern Present (02/11/2022)   [place]    Feeling of Stress : Only a little  Social Connections: Moderately Integrated (02/11/2022)   Social Connection and Isolation Panel [NHANES]    Frequency of Communication with Friends and Family: More than three times a week    Frequency of Social Gatherings with Friends and Family: Not on file    Attends Religious Services: More than 4 times per year    Active Member of [employer] or Organizations: Yes    Attends Archivist Meetings: Never    Marital Status: Widowed    Tobacco Counseling Counseling given: Not Answered   Clinical  Intake:  Pre-visit preparation completed: Yes  Pain : No/denies pain     Nutritional Status: BMI of 19-24  Normal Nutritional Risks: None Diabetes: No  How often do you need to have someone help you when you read instructions, pamphlets, or other written materials from your doctor or pharmacy?: 4 - Often What is the last grade level you completed in school?: 10th grade  Diabetic?No  Interpreter Needed?: No      Activities of Daily Living    02/11/2022    1:10 PM  In your present state of health, do you have any difficulty performing the following activities:  Hearing? 1  Vision? 0  Difficulty concentrating or making decisions? 1  Walking or climbing stairs? 1  Dressing or bathing? 1  Doing errands, shopping? 1  Preparing Food and eating ? Y  Using the Toilet? Y  In the past six months, have you accidently leaked urine? Y  Do you have problems with loss of bowel control? N  Managing your Medications? Y  Managing your Finances? Y  Housekeeping or managing your Housekeeping? Y    Patient Care Team: Yvonna Alanis, NP as PCP - General (Adult Health Nurse Practitioner) [name], MD as PCP - Cardiology (Cardiology) Leandrew Koyanagi, MD as Referring Physician (Ophthalmology)  Indicate any recent Medical Services you may have received from other than [facility] providers in the past year (date may be approximate).     Assessment:   This is a routine wellness examination for Brianna Barnes.  Hearing/Vision screen Hearing Screening - Comments:: Hearing is totally gone, hearing aids do not help  Vision Screening - Comments:: Last eye exam less than 12 months ago, [facility] in [place] issues and exercise activities discussed: Exercise limited by: neurologic condition(s);cardiac condition(s)   Goals Addressed             This Visit's Progress    DIET - INCREASE WATER INTAKE   On track    Patient Stated   On track  01/08/2019, Patient will  maintain and continue medications as prescribed.        Depression Screen    02/11/2022    1:08 PM 02/11/2022   12:53 PM 09/03/2021    9:37 AM 04/23/2021    9:10 AM 01/29/2021   10:42 AM 01/29/2021   10:27 AM 04/29/2020   11:30 AM  PHQ 2/9 Scores  PHQ - 2 Score 0 0 0 0 0 0 0    Fall Risk    02/11/2022    1:09 PM 02/11/2022   12:52 PM 09/03/2021    9:37 AM 04/23/2021    9:09 AM 02/19/2021    8:56 AM  Fall Risk   Falls in the past year? 0 0 0 0 0  Number falls in past yr: 0 0 0 0 0  Injury with Fall? 0 0 1 0 0  Risk for fall due to : History of fall(s);Impaired balance/gait No Fall Risks No Fall Risks No Fall Risks History of fall(s)  Follow up Falls evaluation completed;Education provided Falls evaluation completed Falls evaluation completed Falls evaluation completed Falls evaluation completed;Education provided;Falls prevention discussed    FALL RISK PREVENTION PERTAINING TO THE HOME:  Any stairs in or around the home? No  If so, are there any without handrails? No  Home free of loose throw rugs in walkways, pet beds, electrical cords, etc? Yes  Adequate lighting in your home to reduce risk of falls? Yes   ASSISTIVE DEVICES UTILIZED TO PREVENT FALLS:  Life alert? Yes  Use of a cane, walker or w/c? Yes  Grab bars in the bathroom? Yes  Shower chair or bench in shower? Yes  Elevated toilet seat or a handicapped toilet? Yes   TIMED UP AND GO:  Was the test performed? .  Length of time to ambulate 10 feet: N/a sec.   Gait slow and steady with assistive device  Cognitive Function:    01/29/2021   10:46 AM 05/30/2020    3:39 PM 01/08/2019    2:26 PM  MMSE - Mini Mental State Exam  Not completed: Refused  Unable to complete  Orientation to time  5   Orientation to Place  5   Registration  3   Attention/ Calculation  0   Recall  3   Language- name 2 objects  2   Language- repeat  1   Language- follow 3 step command  3   Language- read & follow direction  1    Write a sentence  1   Copy design  1   Total score  25         02/11/2022   12:54 PM 01/29/2021   10:29 AM  6CIT Screen  What Year? 0 points 0 points  What month? 0 points 0 points  What time? 0 points 0 points  Count back from 20 0 points 0 points  Months in reverse 2 points 0 points  Repeat phrase 10 points 8 points  Total Score 12 points 8 points    Immunizations Immunization History  Administered Date(s) Administered   Fluad Quad(high Dose 65+) 11/21/2018   Influenza Split 12/27/2011   Influenza Whole 01/13/2009, 12/27/2011   Influenza, High Dose Seasonal PF 11/29/2016, 12/05/2017, 11/28/2019, 12/02/2020   Influenza, Seasonal, Injecte, Preservative Fre 12/18/2014, 11/27/2015   Influenza-Unspecified 11/17/2016, 11/21/2018   PFIZER Comirnaty(Gray Top)Covid-19 Tri-Sucrose Vaccine 06/19/2020   PFIZER(Purple Top)SARS-COV-2 Vaccination 06/28/2019, 07/24/2019, 02/14/2020   Pfizer Covid-19 Vaccine Bivalent Booster 6yr & up 03/04/2021   Pneumococcal Conjugate-13  01/25/2014   Pneumococcal Polysaccharide-23 03/15/2005   Td 03/15/2002   Tdap 02/25/2017   Zoster, Live 07/14/2011    TDAP status: Up to date  Flu Vaccine status: Declined, Education has been provided regarding the importance of this vaccine but patient still declined. Advised may receive this vaccine at local pharmacy or Health Dept. Aware to provide a copy of the vaccination record if obtained from local pharmacy or Health Dept. Verbalized acceptance and understanding.  Pneumococcal vaccine status: Up to date  Covid-19 vaccine status: Information provided on how to obtain vaccines.   Qualifies for Shingles Vaccine? Yes   Zostavax completed Yes   Shingrix Completed?: No.    Education has been provided regarding the importance of this vaccine. Patient has been advised to call insurance company to determine out of pocket expense if they have not yet received this vaccine. Advised may also receive vaccine at local  pharmacy or Health Dept. Verbalized acceptance and understanding.  Screening Tests Health Maintenance  Topic Date Due   INFLUENZA VACCINE  10/13/2021   COVID-19 Vaccine (6 - 2023-24 season) 11/13/2021   Medicare Annual Wellness (AWV)  02/12/2023   DTaP/Tdap/Td (3 - Td or Tdap) 02/26/2027   Pneumonia Vaccine 70+ Years old  Completed   DEXA SCAN  Completed   HPV VACCINES  Aged Out   MAMMOGRAM  Discontinued   Zoster Vaccines- Shingrix  Discontinued    Health Maintenance  Health Maintenance Due  Topic Date Due   INFLUENZA VACCINE  10/13/2021   COVID-19 Vaccine (6 - 2023-24 season) 11/13/2021    Colorectal cancer screening: No longer required.   Mammogram status: No longer required due to advanced age.  Bone Density status: Completed 2021. Results reflect: Bone density results: OSTEOPOROSIS. Repeat every 2 years.  Lung Cancer Screening: (Low Dose CT Chest recommended if Age 76-80 years, 30 pack-year currently smoking OR have quit w/in 15years.) does not qualify.   Lung Cancer Screening Referral: No  Additional Screening:  Hepatitis C Screening: does not qualify; Completed   Vision Screening: Recommended annual ophthalmology exams for early detection of glaucoma and other disorders of the eye. Is the patient up to date with their annual eye exam?  Yes  Who is the provider or what is the name of the office in which the patient attends annual eye exams? Dr. Mordecai Rasmussen If pt is not established with a provider, would they like to be referred to a provider to establish care? No .   Dental Screening: Recommended annual dental exams for proper oral hygiene  Community Resource Referral / Chronic Care Management: CRR required this visit?  No   CCM required this visit?  No      Plan:     I have personally reviewed and noted the following in the patient's chart:   Medical and social history Use of alcohol, tobacco or illicit drugs  Current medications and supplements  including opioid prescriptions. Patient is not currently taking opioid prescriptions. Functional ability and status Nutritional status Physical activity Advanced directives List of other physicians Hospitalizations, surgeries, and ER visits in previous 12 months Vitals Screenings to include cognitive, depression, and falls Referrals and appointments  In addition, I have reviewed and discussed with patient certain preventive protocols, quality metrics, and best practice recommendations. A written personalized care plan for preventive services as well as general preventive health recommendations were provided to patient.  Telephone Note  I connected with Brianna Barnes by telephone and verified that I am speaking with the correct  person using two identifiers.  Location: [place] Provider: Yvonna Alanis, NP    I discussed the limitations, risks, security and privacy concerns of performing an evaluation and management service by telephone and the availability of in person appointments. I also discussed with the patient that there may be a patient responsible charge related to this service. The patient expressed understanding and agreed to proceed.   I discussed the assessment and treatment plan with the patient. The patient was provided an opportunity to ask questions and all were answered. The patient agreed with the plan and demonstrated an understanding of the instructions.   The patient was advised to call back or seek an in-person evaluation if the symptoms worsen or if the condition fails to improve as anticipated.  I provided 16 minutes of non-face-to-face time during this encounter.  Yvonna Alanis, NP Avs printed and mailed      Yvonna Alanis, NP   02/11/2022   Nurse Notes: recommend flu vaccine 03/11/2022 visit

## 2022-02-11 NOTE — Progress Notes (Signed)
   This service is provided via telemedicine  No vital signs collected/recorded due to the encounter was a telemedicine visit.   Location of patient (ex: home, work):  Home  Patient consents to a telephone visit: Yes, see telephone visit dated 02/11/22  Location of the provider (ex: office, home):  [facility], Office   Name of any referring provider:  N/A  Names of all persons participating in the telemedicine service and their role in the encounter:  S.Chrae B/CMA, [name], NP, daughter Neoma Laming), Abbe [place] (son in law), and Patient   Time spent on call:  9 min with medical assistant

## 2022-02-11 NOTE — Telephone Encounter (Signed)
Ms. skarlett, sedlacek are scheduled for a virtual visit with your provider today.    Just as we do with appointments in the office, we must obtain your consent to participate.  Your consent will be active for this visit and any virtual visit you may have with one of our providers in the next 365 days.    If you have a MyChart account, I can also send a copy of this consent to you electronically.  All virtual visits are billed to your insurance company just like a traditional visit in the office.  As this is a virtual visit, video technology does not allow for your provider to perform a traditional examination.  This may limit your provider's ability to fully assess your condition.  If your provider identifies any concerns that need to be evaluated in person or the need to arrange testing such as labs, EKG, etc, we will make arrangements to do so.    Although advances in technology are sophisticated, we cannot ensure that it will always work on either your end or our end.  If the connection with a video visit is poor, we may have to switch to a telephone visit.  With either a video or telephone visit, we are not always able to ensure that we have a secure connection.   I need to obtain your verbal consent now.   Are you willing to proceed with your visit today?   Adilynne Casmira Cramer has provided verbal consent on 02/11/2022 for a virtual visit (video or telephone).   Leigh Aurora Coburn, [place] 02/11/2022  2:31 PM

## 2022-03-11 ENCOUNTER — Ambulatory Visit (INDEPENDENT_AMBULATORY_CARE_PROVIDER_SITE_OTHER): Payer: Medicare Other | Admitting: Orthopedic Surgery

## 2022-03-11 ENCOUNTER — Encounter: Payer: Self-pay | Admitting: Orthopedic Surgery

## 2022-03-11 VITALS — BP 122/70 | HR 72 | Temp 96.6°F | Ht <= 58 in | Wt 111.0 lb

## 2022-03-11 DIAGNOSIS — F03A0 Unspecified dementia, mild, without behavioral disturbance, psychotic disturbance, mood disturbance, and anxiety: Secondary | ICD-10-CM | POA: Diagnosis not present

## 2022-03-11 DIAGNOSIS — I441 Atrioventricular block, second degree: Secondary | ICD-10-CM | POA: Diagnosis not present

## 2022-03-11 DIAGNOSIS — R5383 Other fatigue: Secondary | ICD-10-CM

## 2022-03-11 DIAGNOSIS — M810 Age-related osteoporosis without current pathological fracture: Secondary | ICD-10-CM

## 2022-03-11 DIAGNOSIS — Z23 Encounter for immunization: Secondary | ICD-10-CM | POA: Diagnosis not present

## 2022-03-11 DIAGNOSIS — I10 Essential (primary) hypertension: Secondary | ICD-10-CM

## 2022-03-11 MED ORDER — DENOSUMAB 60 MG/ML ~~LOC~~ SOSY
60.0000 mg | PREFILLED_SYRINGE | Freq: Once | SUBCUTANEOUS | Status: AC
  Administered 2022-03-11: 60 mg via SUBCUTANEOUS

## 2022-03-11 NOTE — Progress Notes (Signed)
Careteam: Patient Care Team: Yvonna Alanis, NP as PCP - General (Adult Health Nurse Practitioner) [name], MD as PCP - Cardiology (Cardiology) Leandrew Koyanagi, MD as Referring Physician (Ophthalmology)  Seen by: [name], AGNP-C  PLACE OF SERVICE:  [place] Directive information    No Known Allergies  Chief Complaint  Patient presents with   Medical Management of Chronic Issues    Patient presents today for a 6 month follow-up     HPI: Patient is a 88 y.o. female seen today for medical management of chronic conditions.   Family present during encounter today.   No health concerns.   No behaviors.   No recent falls. Ambulates with walker at home. Uses wheelchair during long distances.   Continues to live with family. They take turns during day to watch over her. They have noticed she gets tired more easily. She is also more forgetful.   Continues to have pacemaker checked, no concerns.   Weights stable.   She got flu vaccine and Prolia today. Discussed getting covid vaccine at local pharmacy.   Review of Systems:  Review of Systems  Constitutional:  Negative for chills and fever.  HENT:  Positive for hearing loss. Negative for congestion and sore throat.   Eyes:  Negative for blurred vision and double vision.  Respiratory:  Negative for cough, shortness of breath and wheezing.   Cardiovascular:  Negative for chest pain and leg swelling.  Gastrointestinal:  Positive for heartburn. Negative for abdominal pain, blood in stool, constipation, diarrhea, nausea and vomiting.  Genitourinary:  Negative for dysuria.  Musculoskeletal:  Negative for falls and joint pain.  Skin:  Negative for rash.  Neurological:  Positive for weakness. Negative for dizziness and headaches.  Psychiatric/Behavioral:  Positive for memory loss. Negative for depression. The patient is not nervous/anxious and does not have insomnia.     Past Medical History:   Diagnosis Date   Arthritis    Choledocholithiasis    Cirrhosis, cryptogenic (HCC)    FOLLOWED BY DR PYRTLE-- LOV  IN EPIC--  STABLE PER NOTE   Common bile duct stone    Cryptogenic cirrhosis (HCC)    DDD (degenerative disc disease), lumbar    Diverticulosis    Esophageal varix (HCC)    GERD (gastroesophageal reflux disease)    Hiatal hernia    History of colonoscopy with polypectomy    ADENOMATOUS AND HYPERPLASIA POLYPS  (2008  &  2010)   History of hepatitis C    History of positive PPD    + TB SKIN TEST WITHOUT TB   Hyperlipidemia    Hypertension, portal (HCC)    SECONDARY TO CRYPTOGENIC CIRRHOSIS   Internal hemorrhoids    Iron deficiency anemia    Loss of hearing    right ear   Osteoarthritis    hands, knees, and low back   Osteoporosis    Portal hypertension (HCC)    PUD (peptic ulcer disease)    Squamous cell carcinoma of skin 04/07/2017   left cheek   Tubular adenoma of colon    Uterovaginal prolapse, incomplete    Wears glasses    Wears hearing aid    left ear only   Past Surgical History:  Procedure Laterality Date   APPENDECTOMY  1960'S   CHOLECYSTECTOMY  08/25/2011   Procedure: LAPAROSCOPIC CHOLECYSTECTOMY WITH INTRAOPERATIVE CHOLANGIOGRAM;  Surgeon: Zenovia Jarred, MD;  Location: [facility] OR;  Service: General;  Laterality: N/A;   ENDOSCOPIC RETROGRADE CHOLANGIOPANCREATOGRAPHY (  ERCP) WITH PROPOFOL N/A 11/30/2012   Procedure: ENDOSCOPIC RETROGRADE CHOLANGIOPANCREATOGRAPHY (ERCP) WITH PROPOFOL;  Surgeon: Milus Banister, MD;  Location: [facility];  Service: Endoscopy;  Laterality: N/A;   ESOPHAGOGASTRODUODENOSCOPY N/A 05/18/2018   Procedure: ESOPHAGOGASTRODUODENOSCOPY (EGD);  Surgeon: Lin Landsman, MD;  Location: [facility] ENDOSCOPY;  Service: Gastroenterology;  Laterality: N/A;   EXTERNAL EAR SURGERY Left 1980   HYSTEROSCOPY WITH D & C N/A 07/15/2014   Procedure: DILATATION AND CURETTAGE /HYSTEROSCOPY;  Surgeon: Molli Posey, MD;  Location: [facility];  Service: Gynecology;  Laterality: N/A;   INTRAMEDULLARY (IM) NAIL INTERTROCHANTERIC Left 01/22/2018   Procedure: ORIF LEFT INTERTROCHANTRIC HIP FX;  Surgeon: Mcarthur Rossetti, MD;  Location: [facility];  Service: Orthopedics;  Laterality: Left;   KNEE ARTHROSCOPY W/ MENISCECTOMY Right 06-19-2002   LUMBAR [place] SURGERY  1990   PACEMAKER IMPLANT N/A 12/16/2020   Procedure: PACEMAKER IMPLANT;  Surgeon: Thompson Grayer, MD;  Location: [facility];  Service: Cardiovascular;  Laterality: N/A;   REMOVAL OF URINARY SLING N/A 09/13/2013   Procedure: REMOVAL OF RETAINED PESSARY;  Surgeon: Jorja Loa, MD;  Location: [facility];  Service: Urology;  Laterality: N/A;   Social History:   reports that she has never smoked. She has never used smokeless tobacco. She reports that she does not drink alcohol and does not use drugs.  Family History  Problem Relation Age of Onset   Heart disease Mother    Heart disease Father 62   Hyperlipidemia Father    Diabetes Father    Cirrhosis Sister    Diabetes Brother    Heart disease Brother    Cirrhosis Brother    Pancreatic cancer Brother    Colon cancer Neg Hx    Colon polyps Neg Hx    Esophageal cancer Neg Hx    Stomach cancer Neg Hx    Inflammatory bowel disease Neg Hx     Medications: Patient's Medications  New Prescriptions   No medications on file  Previous Medications   CALCIUM-PHOSPHORUS-VITAMIN D (CALCIUM GUMMIES PO)    Take 2 tablets by mouth daily.   CETIRIZINE (ZYRTEC) 10 MG TABLET    Take 10 mg by mouth daily as needed for allergies.   CHOLECALCIFEROL (VITAMIN D3) 50 MCG (2000 UT) CAPS    Take 1 capsule by mouth daily in the afternoon.   DENOSUMAB (PROLIA) 60 MG/ML SOSY INJECTION    Inject 60 mg into the skin every 6 (six) months.   IRON, FERROUS SULFATE, 325 (65 FE) MG TABS    Take 325 mg by mouth every other day.   MEMANTINE (NAMENDA) 10 MG TABLET    TAKE 1 TABLET BY MOUTH TWICE A DAY   PANTOPRAZOLE  (PROTONIX) 20 MG TABLET    TAKE 1 TABLET (20 MG TOTAL) BY MOUTH DAILY BEFORE SUPPER.   PANTOPRAZOLE (PROTONIX) 40 MG TABLET    TAKE 1 TABLET BY MOUTH EVERY DAY BEFORE BREAKFAST   SENNOSIDES-DOCUSATE SODIUM (SENOKOT-S) 8.6-50 MG TABLET    Take 1 tablet by mouth daily as needed for constipation.   TIMOLOL (TIMOPTIC) 0.5 % OPHTHALMIC SOLUTION    Place 1 drop into both eyes 2 (two) times daily.    TRAMADOL (ULTRAM) 50 MG TABLET    TAKE 1 TABLET (50 MG TOTAL) BY MOUTH EVERY 6 (SIX) HOURS AS NEEDED FOR MODERATE PAIN   TRAZODONE (DESYREL) 100 MG TABLET    TAKE 1 TABLET BY MOUTH EVERYDAY AT BEDTIME   VITAMIN E 400  UNIT CAPSULE    Take 400 Units by mouth daily.  Modified Medications   No medications on file  Discontinued Medications   No medications on file    Physical Exam:  Vitals:   03/11/22 0935  BP: 122/70  Pulse: 72  Temp: (!) 96.6 F (35.9 C)  SpO2: 97%  Weight: 111 lb (50.3 kg)  Height: '4\' 10"'$  (1.473 m)   Body mass index is 23.2 kg/m. Wt Readings from Last 3 Encounters:  03/11/22 111 lb (50.3 kg)  02/11/22 110 lb (49.9 kg)  09/03/21 111 lb 12.8 oz (50.7 kg)    Physical Exam Vitals reviewed.  Constitutional:      General: She is not in acute distress. HENT:     Head: Normocephalic.     Ears:     Comments: Bilateral hearing aids    Nose: Nose normal.     Mouth/Throat:     Mouth: Mucous membranes are moist.  Eyes:     General:        Right eye: No discharge.        Left eye: No discharge.  Neck:     Thyroid: No thyroid mass or thyromegaly.     Vascular: No carotid bruit.  Cardiovascular:     Rate and Rhythm: Normal rate and regular rhythm.     Pulses: Normal pulses.     Heart sounds: Normal heart sounds.     Comments: Pacemaker left upper chest Pulmonary:     Effort: Pulmonary effort is normal. No respiratory distress.     Breath sounds: Normal breath sounds. No wheezing.  Abdominal:     General: Bowel sounds are normal. There is no distension.     Palpations:  Abdomen is soft.     Tenderness: There is no abdominal tenderness.  Musculoskeletal:     Cervical back: Neck supple.     Right lower leg: No edema.     Left lower leg: No edema.  Lymphadenopathy:     Cervical: No cervical adenopathy.  Skin:    General: Skin is warm and dry.     Capillary Refill: Capillary refill takes less than 2 seconds.  Neurological:     General: No focal deficit present.     Mental Status: She is alert. Mental status is at baseline.     Motor: Weakness present.     Gait: Gait abnormal.     Comments: Walker/wheelchair  Psychiatric:        Mood and Affect: Mood normal.        Behavior: Behavior normal.     Labs reviewed: Basic Metabolic Panel: Recent Labs    04/17/21 2123 04/23/21 0937 09/03/21 1024  NA 143 143 142  K 3.3* 3.6 4.0  CL 106 109 108  CO2 '30 30 27  '$ GLUCOSE 145* 84 93  BUN '16 14 9  '$ CREATININE 0.73 0.61 0.66  CALCIUM 10.2 8.3* 8.5*   Liver Function Tests: Recent Labs    09/03/21 1024 09/29/21 0829  AST 49* 43*  ALT 24 21  BILITOT 1.1 1.5*  PROT 5.2* 5.2*   No results for input(s): "LIPASE", "AMYLASE" in the last 8760 hours. No results for input(s): "AMMONIA" in the last 8760 hours. CBC: Recent Labs    04/17/21 2123 09/03/21 1024  WBC 5.2 4.0  NEUTROABS 3.3 2,532  HGB 12.8 12.6  HCT 39.6 36.8  MCV 95.2 91.5  PLT 108* 113*   Lipid Panel: No results for input(s): "CHOL", "HDL", "LDLCALC", "TRIG", "CHOLHDL", "  LDLDIRECT" in the last 8760 hours. TSH: No results for input(s): "TSH" in the last 8760 hours. A1C:/ Lab Results  Component Value Date   HGBA1C 4.9 01/18/2014     Assessment/Plan 1. Age-related osteoporosis without current pathological fracture - DEXA ordered- plan to have done 2024 - denosumab (PROLIA) injection 60 mg  2. Need for influenza vaccination - Flu Vaccine QUAD High Dose(Fluad)  3. Mild dementia without behavioral disturbance, psychotic disturbance, mood disturbance, or anxiety, unspecified  dementia type ([place]) - no behaviors - good family support - cont Namenda - MMSE- future  4. Essential hypertension - BUN/creat 9/0.66 08/2021 - controlled without medication  5. Mobitz type 2 second degree AV block - s/p pacemaker 2023 - followed by cardiology - asymptomatic   Future labs/tests: MMSE, cbc/diff, cmp, TSH  Total time: 31 minutes. Greater than 50% of total time spent doing patient education regarding health maintenance, dementia, HTN, osteoporosis.    Next appt: Visit date not found  [place], [facility] [phone]

## 2022-03-11 NOTE — Patient Instructions (Addendum)
Please schedule bone density exam sometime in 2024 at the [facility] [place]  Please get new covid vaccine at local pharmacy

## 2022-03-30 ENCOUNTER — Ambulatory Visit: Payer: Medicare Other | Attending: Internal Medicine

## 2022-03-30 DIAGNOSIS — I441 Atrioventricular block, second degree: Secondary | ICD-10-CM

## 2022-03-30 LAB — CUP PACEART REMOTE DEVICE CHECK
Battery Remaining Longevity: 95 mo
Battery Remaining Percentage: 89 %
Battery Voltage: 3.01 V
Brady Statistic AP VP Percent: 2.2 %
Brady Statistic AP VS Percent: 1 %
Brady Statistic AS VP Percent: 93 %
Brady Statistic AS VS Percent: 4.4 %
Brady Statistic RA Percent Paced: 2.3 %
Brady Statistic RV Percent Paced: 95 %
Date Time Interrogation Session: 20240116020015
Implantable Lead Connection Status: 753985
Implantable Lead Connection Status: 753985
Implantable Lead Implant Date: 20221004
Implantable Lead Implant Date: 20221004
Implantable Lead Location: 753859
Implantable Lead Location: 753860
Implantable Pulse Generator Implant Date: 20221004
Lead Channel Impedance Value: 380 Ohm
Lead Channel Impedance Value: 580 Ohm
Lead Channel Pacing Threshold Amplitude: 0.5 V
Lead Channel Pacing Threshold Amplitude: 0.75 V
Lead Channel Pacing Threshold Pulse Width: 0.5 ms
Lead Channel Pacing Threshold Pulse Width: 0.5 ms
Lead Channel Sensing Intrinsic Amplitude: 12 mV
Lead Channel Sensing Intrinsic Amplitude: 2.3 mV
Lead Channel Setting Pacing Amplitude: 2 V
Lead Channel Setting Pacing Amplitude: 2.5 V
Lead Channel Setting Pacing Pulse Width: 0.5 ms
Lead Channel Setting Sensing Sensitivity: 2 mV
Pulse Gen Model: 2272
Pulse Gen Serial Number: 3964696

## 2022-04-23 NOTE — Progress Notes (Signed)
Remote pacemaker transmission.   

## 2022-04-26 ENCOUNTER — Encounter: Payer: Self-pay | Admitting: Orthopedic Surgery

## 2022-04-26 NOTE — Telephone Encounter (Signed)
Message forwarded to Yvonna Alanis, NP to review and reply to patient and/or family member   Last OV 03/11/2022  Side note: If you are ok to fill out FL2 with no appointment let me know, I will fill out all I can and place in your review and sign folder.

## 2022-04-27 ENCOUNTER — Telehealth: Payer: Self-pay | Admitting: *Deleted

## 2022-04-27 ENCOUNTER — Encounter: Payer: Self-pay | Admitting: Family

## 2022-04-27 ENCOUNTER — Ambulatory Visit (INDEPENDENT_AMBULATORY_CARE_PROVIDER_SITE_OTHER): Payer: Medicare Other | Admitting: Family

## 2022-04-27 VITALS — BP 110/60 | HR 68 | Temp 97.8°F | Resp 16 | Ht <= 58 in | Wt 110.0 lb

## 2022-04-27 DIAGNOSIS — R2681 Unsteadiness on feet: Secondary | ICD-10-CM | POA: Diagnosis not present

## 2022-04-27 DIAGNOSIS — I10 Essential (primary) hypertension: Secondary | ICD-10-CM | POA: Diagnosis not present

## 2022-04-27 DIAGNOSIS — F03A0 Unspecified dementia, mild, without behavioral disturbance, psychotic disturbance, mood disturbance, and anxiety: Secondary | ICD-10-CM

## 2022-04-27 DIAGNOSIS — R5381 Other malaise: Secondary | ICD-10-CM

## 2022-04-27 DIAGNOSIS — M159 Polyosteoarthritis, unspecified: Secondary | ICD-10-CM | POA: Diagnosis not present

## 2022-04-27 DIAGNOSIS — H9193 Unspecified hearing loss, bilateral: Secondary | ICD-10-CM

## 2022-04-27 MED ORDER — TRAMADOL HCL 50 MG PO TABS: 50.0000 mg | ORAL_TABLET | Freq: Four times a day (QID) | ORAL | 2 refills | Status: DC | PRN

## 2022-04-27 NOTE — Telephone Encounter (Signed)
Form and office notes completed.please fax form

## 2022-04-27 NOTE — Progress Notes (Signed)
Provider: Fitz Matsuo FNP-C  Yvonna Alanis, NP  Patient Care Team: Yvonna Alanis, NP as PCP - General (Adult Health Nurse Practitioner) [name], MD as PCP - Cardiology (Cardiology) Leandrew Koyanagi, MD as Referring Physician (Ophthalmology)  Extended Emergency Contact Information Primary Emergency Contact: Lesle Chris Address: [address]          [place], [place] [address] Johnnette Litter of [place] Phone: [phone] Mobile Phone: [phone] Relation: Daughter Secondary Emergency Contact: Flores,Cathy  [place] Phone: [phone] Relation: Daughter  Code Status:  DNR Goals of care: Advanced Directive information    02/11/2022   11:20 AM  Advanced Directives  Does Patient Have a Medical Advance Directive? Yes  Type of Advance Directive [place]  Does patient want to make changes to medical advance directive? No - Patient declined  Copy of [place] in Chart? Yes - validated most recent copy scanned in chart (See row information)     Chief Complaint  Patient presents with   Medical Management of Chronic Issues    Patient is here to discuss care management and possible SNF   Immunizations    Patient is due for updated covid vaccine    HPI:  Pt is a 89 y.o. female seen today for an acute visit for evaluation of deconditioning.she is here with her daughter and Husband who provides HPI information.States patient Physical condition has declined.Has been unsteady on her feet.Daughter was assisting with her shower in the bathroom patient almost fell and daughter was unable to hold.Has a walking shower but getting difficult for patient to walk into the shower. She requires total care assistance with her basic activity of daily Living. Has a significant medical history of Osteoarthritis,unsteady gait,mild dementia,Hypertension,very Hard of Hearing bilateral,Age-related Osteoporosis. She  denies any acute upper respiratory infection or symptoms of UTI  Has had Physical therapy in the past but did not work well due to patient's cognitive impairment.    Past Medical History:  Diagnosis Date   Arthritis    Choledocholithiasis    Cirrhosis, cryptogenic (HCC)    FOLLOWED BY DR PYRTLE-- LOV  IN EPIC--  STABLE PER NOTE   Common bile duct stone    Cryptogenic cirrhosis (HCC)    DDD (degenerative disc disease), lumbar    Diverticulosis    Esophageal varix (HCC)    GERD (gastroesophageal reflux disease)    Hiatal hernia    History of colonoscopy with polypectomy    ADENOMATOUS AND HYPERPLASIA POLYPS  (2008  &  2010)   History of hepatitis C    History of positive PPD    + TB SKIN TEST WITHOUT TB   Hyperlipidemia    Hypertension, portal (HCC)    SECONDARY TO CRYPTOGENIC CIRRHOSIS   Internal hemorrhoids    Iron deficiency anemia    Loss of hearing    right ear   Osteoarthritis    hands, knees, and low back   Osteoporosis    Portal hypertension (HCC)    PUD (peptic ulcer disease)    Squamous cell carcinoma of skin 04/07/2017   left cheek   Tubular adenoma of colon    Uterovaginal prolapse, incomplete    Wears glasses    Wears hearing aid    left ear only   Past Surgical History:  Procedure Laterality Date   APPENDECTOMY  1960'S   CHOLECYSTECTOMY  08/25/2011   Procedure: LAPAROSCOPIC CHOLECYSTECTOMY WITH INTRAOPERATIVE CHOLANGIOGRAM;  Surgeon: Merri Ray  Grandville Silos, MD;  Location: [facility];  Service: General;  Laterality: N/A;   ENDOSCOPIC RETROGRADE CHOLANGIOPANCREATOGRAPHY (ERCP) WITH PROPOFOL N/A 11/30/2012   Procedure: ENDOSCOPIC RETROGRADE CHOLANGIOPANCREATOGRAPHY (ERCP) WITH PROPOFOL;  Surgeon: Milus Banister, MD;  Location: [facility];  Service: Endoscopy;  Laterality: N/A;   ESOPHAGOGASTRODUODENOSCOPY N/A 05/18/2018   Procedure: ESOPHAGOGASTRODUODENOSCOPY (EGD);  Surgeon: Lin Landsman, MD;  Location: [facility];  Service: Gastroenterology;  Laterality:  N/A;   EXTERNAL EAR SURGERY Left 1980   HYSTEROSCOPY WITH D & C N/A 07/15/2014   Procedure: DILATATION AND CURETTAGE /HYSTEROSCOPY;  Surgeon: Molli Posey, MD;  Location: [facility];  Service: Gynecology;  Laterality: N/A;   INTRAMEDULLARY (IM) NAIL INTERTROCHANTERIC Left 01/22/2018   Procedure: ORIF LEFT INTERTROCHANTRIC HIP FX;  Surgeon: Mcarthur Rossetti, MD;  Location: [facility];  Service: Orthopedics;  Laterality: Left;   KNEE ARTHROSCOPY W/ MENISCECTOMY Right 06-19-2002   LUMBAR [place] SURGERY  1990   PACEMAKER IMPLANT N/A 12/16/2020   Procedure: PACEMAKER IMPLANT;  Surgeon: Thompson Grayer, MD;  Location: [facility];  Service: Cardiovascular;  Laterality: N/A;   REMOVAL OF URINARY SLING N/A 09/13/2013   Procedure: REMOVAL OF RETAINED PESSARY;  Surgeon: Jorja Loa, MD;  Location[facility];  Service: Urology;  Laterality: N/A;    No Known Allergies  Outpatient Encounter Medications as of 04/27/2022  Medication Sig   Calcium-Phosphorus-Vitamin D (CALCIUM GUMMIES PO) Take 2 tablets by mouth daily.   cetirizine (ZYRTEC) 10 MG tablet Take 10 mg by mouth daily as needed for allergies.   Cholecalciferol (VITAMIN D3) 50 MCG (2000 UT) CAPS Take 1 capsule by mouth daily in the afternoon.   denosumab (PROLIA) 60 MG/ML SOSY injection Inject 60 mg into the skin every 6 (six) months.   Iron, Ferrous Sulfate, 325 (65 Fe) MG TABS Take 325 mg by mouth every other day.   memantine (NAMENDA) 10 MG tablet TAKE 1 TABLET BY MOUTH TWICE A DAY   pantoprazole (PROTONIX) 20 MG tablet TAKE 1 TABLET (20 MG TOTAL) BY MOUTH DAILY BEFORE SUPPER.   pantoprazole (PROTONIX) 40 MG tablet TAKE 1 TABLET BY MOUTH EVERY DAY BEFORE BREAKFAST   sennosides-docusate sodium (SENOKOT-S) 8.6-50 MG tablet Take 1 tablet by mouth daily as needed for constipation.   timolol (TIMOPTIC) 0.5 % ophthalmic solution Place 1 drop into both eyes 2 (two) times daily.    traMADol (ULTRAM) 50 MG  tablet TAKE 1 TABLET (50 MG TOTAL) BY MOUTH EVERY 6 (SIX) HOURS AS NEEDED FOR MODERATE PAIN   traZODone (DESYREL) 100 MG tablet TAKE 1 TABLET BY MOUTH EVERYDAY AT BEDTIME   vitamin E 400 UNIT capsule Take 400 Units by mouth daily.   No facility-administered encounter medications on file as of 04/27/2022.    Review of Systems  Unable to perform ROS: Dementia (Additional HPI provided by patient's daughter and Husband)  Constitutional:  Negative for appetite change, chills, fatigue, fever and unexpected weight change.  HENT:  Positive for hearing loss. Negative for congestion, dental problem, ear discharge, ear pain, facial swelling, nosebleeds, postnasal drip, rhinorrhea, sinus pressure, sinus pain, sneezing, sore throat, tinnitus and trouble swallowing.   Eyes:  Negative for pain, discharge, redness, itching and visual disturbance.  Respiratory:  Negative for cough, chest tightness, shortness of breath and wheezing.   Cardiovascular:  Negative for chest pain, palpitations and leg swelling.  Gastrointestinal:  Negative for abdominal distention, abdominal pain, blood in stool, constipation, diarrhea, nausea and vomiting.  Endocrine: Negative for cold  intolerance, heat intolerance, polydipsia, polyphagia and polyuria.  Genitourinary:  Negative for difficulty urinating, dysuria, flank pain, frequency and urgency.  Musculoskeletal:  Positive for arthralgias and gait problem. Negative for back pain, joint swelling, myalgias, neck pain and neck stiffness.  Skin:  Negative for color change, pallor, rash and wound.  Neurological:  Negative for dizziness, syncope, speech difficulty, weakness, light-headedness, numbness and headaches.  Hematological:  Does not bruise/bleed easily.  Psychiatric/Behavioral:  Positive for confusion. Negative for agitation, behavioral problems, hallucinations and sleep disturbance. The patient is not nervous/anxious.     Immunization History  Administered Date(s)  Administered   Fluad Quad(high Dose 65+) 11/21/2018, 03/11/2022   Influenza Split 12/27/2011   Influenza Whole 01/13/2009, 12/27/2011   Influenza, High Dose Seasonal PF 11/29/2016, 12/05/2017, 11/28/2019, 12/02/2020   Influenza, Seasonal, Injecte, Preservative Fre 12/18/2014, 11/27/2015   Influenza-Unspecified 11/17/2016, 11/21/2018   PFIZER Comirnaty(Gray Top)Covid-19 Tri-Sucrose Vaccine 06/19/2020   PFIZER(Purple Top)SARS-COV-2 Vaccination 06/28/2019, 07/24/2019, 02/14/2020   Pfizer Covid-19 Vaccine Bivalent Booster 24yr & up 03/04/2021   Pneumococcal Conjugate-13 01/25/2014   Pneumococcal Polysaccharide-23 03/15/2005   Td 03/15/2002   Tdap 02/25/2017   Zoster, Live 07/14/2011   Pertinent  Health Maintenance Due  Topic Date Due   INFLUENZA VACCINE  Completed   DEXA SCAN  Completed   MAMMOGRAM  Discontinued      04/23/2021    9:09 AM 09/03/2021    9:37 AM 02/11/2022   12:52 PM 02/11/2022    1:09 PM 03/11/2022    9:41 AM  Fall Risk  Falls in the past year? 0 0 0 0 0  Was there an injury with Fall? 0 1 0 0 0  Fall Risk Category Calculator 0 1 0 0 0  Fall Risk Category (Retired) Low Low Low Low Low  (RETIRED) Patient Fall Risk Level Low fall risk Low fall risk Low fall risk Low fall risk Low fall risk  Patient at Risk for Falls Due to No Fall Risks No Fall Risks No Fall Risks History of fall(s);Impaired balance/gait No Fall Risks  Fall risk Follow up Falls evaluation completed Falls evaluation completed Falls evaluation completed Falls evaluation completed;Education provided Falls evaluation completed   Functional Status Survey:    Vitals:   04/27/22 1051  BP: 110/60  Pulse: 68  Resp: 16  Temp: 97.8 F (36.6 C)  SpO2: 95%  Weight: 110 lb (49.9 kg)  Height: 4' 10"$  (1.473 m)   Body mass index is 22.99 kg/m. Physical Exam Vitals reviewed.  Constitutional:      General: She is not in acute distress.    Appearance: Normal appearance. She is normal weight. She is not  ill-appearing or diaphoretic.     Comments: Generalized weakness   HENT:     Head: Normocephalic.     Right Ear: Tympanic membrane, ear canal and external ear normal. There is no impacted cerumen.     Left Ear: Tympanic membrane, ear canal and external ear normal. There is no impacted cerumen.     Ears:     Comments:  Right ear partial cerumen impaction  Left ear hearing aid      Nose: Nose normal. No congestion or rhinorrhea.     Mouth/Throat:     Mouth: Mucous membranes are moist.     Pharynx: Oropharynx is clear. No oropharyngeal exudate or posterior oropharyngeal erythema.  Eyes:     General: No scleral icterus.       Right eye: No discharge.  Left eye: No discharge.     Extraocular Movements: Extraocular movements intact.     Conjunctiva/sclera: Conjunctivae normal.     Pupils: Pupils are equal, round, and reactive to light.  Neck:     Vascular: No carotid bruit.  Cardiovascular:     Rate and Rhythm: Normal rate and regular rhythm.     Pulses: Normal pulses.     Heart sounds: Normal heart sounds. No murmur heard.    No friction rub. No gallop.  Pulmonary:     Effort: Pulmonary effort is normal. No respiratory distress.     Breath sounds: Normal breath sounds. No wheezing, rhonchi or rales.  Chest:     Chest wall: No tenderness.  Abdominal:     General: Bowel sounds are normal. There is no distension.     Palpations: Abdomen is soft. There is no mass.     Tenderness: There is no abdominal tenderness. There is no right CVA tenderness, left CVA tenderness, guarding or rebound.  Musculoskeletal:        General: No swelling or tenderness. Normal range of motion.     Cervical back: Normal range of motion. No rigidity or tenderness.     Right lower leg: Edema present.     Left lower leg: Edema present.     Comments: On wheelchair during visit. Bilateral 1+ edema on lower extremities  Unsteady gait   Lymphadenopathy:     Cervical: No cervical adenopathy.  Skin:     General: Skin is warm and dry.     Coloration: Skin is not pale.     Findings: No bruising, erythema, lesion or rash.  Neurological:     Mental Status: She is alert and oriented to person, place, and time. Mental status is at baseline.     Cranial Nerves: No cranial nerve deficit.     Sensory: No sensory deficit.     Motor: No weakness.     Coordination: Coordination normal.     Gait: Gait abnormal.  Psychiatric:        Mood and Affect: Mood normal.        Speech: Speech normal.        Behavior: Behavior normal.        Thought Content: Thought content normal.        Cognition and Memory: Cognition is impaired. Memory is impaired.        Judgment: Judgment normal.     Labs reviewed: Recent Labs    09/03/21 1024  NA 142  K 4.0  CL 108  CO2 27  GLUCOSE 93  BUN 9  CREATININE 0.66  CALCIUM 8.5*   Recent Labs    09/03/21 1024 09/29/21 0829  AST 49* 43*  ALT 24 21  BILITOT 1.1 1.5*  PROT 5.2* 5.2*   Recent Labs    09/03/21 1024  WBC 4.0  NEUTROABS 2,532  HGB 12.6  HCT 36.8  MCV 91.5  PLT 113*   Lab Results  Component Value Date   TSH 1.930 12/16/2020   Lab Results  Component Value Date   HGBA1C 4.9 01/18/2014   Lab Results  Component Value Date   CHOL 179 01/26/2016   HDL 47.00 01/26/2016   LDLCALC 112 (H) 01/26/2016   LDLDIRECT 135.2 06/06/2009   TRIG 96.0 01/26/2016   CHOLHDL 4 01/26/2016    Significant Diagnostic Results in last 30 days:  CUP PACEART REMOTE DEVICE CHECK  Result Date: 03/30/2022 Scheduled remote reviewed. Normal device function.  1  AMS, 12 sec Next remote 91 days- JJB   Assessment/Plan 1. Primary osteoarthritis involving multiple joints Continue on current pain regimen.No side effects reported  PDMP reviewed.Tramadol refilled.  - traMADol (ULTRAM) 50 MG tablet; Take 1 tablet (50 mg total) by mouth every 6 (six) hours as needed for moderate pain.  Dispense: 30 tablet; Refill: 2 - Ambulatory referral to Social Work  2.  Debility Has had progressive declined.Unsteady gait high risk for falls.Almost fell with daughter in the shower.Given her physical and cognitive decline in condition family would like assistance with placement in a skilled Facility.Will refer to social work to assist with placement process. - Ambulatory referral to Social Work  3. Unsteady gait Remains high risk for falls. Will require an Nurse Aid to assist in the meantime until able to transfer to higher level of care.Personal Care services form completed today.  - Ambulatory referral to Social Work  4. Mild dementia without behavioral disturbance, psychotic disturbance, mood disturbance, or anxiety, unspecified dementia type ([place]) Progressive decline POA requested assistance with transfer to higher level of care.she requires a total assistance with her ADL's.   - Ambulatory referral to Social Work  5. Essential hypertension B/p well controlled.   6. Bilateral hearing loss, unspecified hearing loss type Has left hearing aids but still unable to hear.   Family/ staff Communication: Reviewed plan of care with patient's daughter and Husband verbalized understanding.   Labs/tests ordered: None   Next Appointment: Return if symptoms worsen or fail to improve.   Sandrea Hughs, NP

## 2022-04-27 NOTE — Telephone Encounter (Signed)
Form faxed as requested.

## 2022-04-27 NOTE — Telephone Encounter (Signed)
Brianna Barnes requested PCS (Personal Care Service)Form to be filled out for patient.  Filled out form and placed in Brianna Barnes's folder to review, fill out and sign.  To be faxed to [facility] fax:[phone] once completed.

## 2022-05-03 NOTE — Telephone Encounter (Signed)
Brianna Moulding, NP received fax back from [facility] and patient does NOT Qualify for [employer]   [name], NP informed patient's son and gave him the number to [employer] [facility] [place] [phone] and [facility] [phone]

## 2022-05-04 ENCOUNTER — Other Ambulatory Visit: Payer: Self-pay | Admitting: Orthopedic Surgery

## 2022-05-04 DIAGNOSIS — G4700 Insomnia, unspecified: Secondary | ICD-10-CM

## 2022-05-04 DIAGNOSIS — K219 Gastro-esophageal reflux disease without esophagitis: Secondary | ICD-10-CM

## 2022-05-04 NOTE — Telephone Encounter (Signed)
High risk or very high risk warning populated when attempting to refill Trazodone. RX request sent to PCP for review and approval if warranted.

## 2022-05-14 ENCOUNTER — Other Ambulatory Visit: Payer: Self-pay | Admitting: Orthopedic Surgery

## 2022-05-14 NOTE — Telephone Encounter (Signed)
Patient medications has warnings while trying to refill. Medications pend and sent to PCP Yvonna Alanis, NP for approval.

## 2022-05-25 ENCOUNTER — Telehealth: Payer: Self-pay | Admitting: *Deleted

## 2022-05-25 DIAGNOSIS — F03A0 Unspecified dementia, mild, without behavioral disturbance, psychotic disturbance, mood disturbance, and anxiety: Secondary | ICD-10-CM

## 2022-05-25 DIAGNOSIS — R5381 Other malaise: Secondary | ICD-10-CM

## 2022-05-25 NOTE — Progress Notes (Signed)
  Care Coordination   Note   05/25/2022 Name: Greer Wainright MRN: [id number] DOB: [birth year]  Garielle Sunday Klos is a 89 y.o. year old female who sees [place], [name] E, NP for primary care. I reached out to [employer] by phone today to offer care coordination services.  Ms. Besse was given information about Care Coordination services today including:   The Care Coordination services include support from the care team which includes your Nurse Coordinator, Clinical Social Worker, or Pharmacist.  The Care Coordination team is here to help remove barriers to the health concerns and goals most important to you. Care Coordination services are voluntary, and the patient may decline or stop services at any time by request to their care team member.   Care Coordination Consent Status: Patient son in law Joice Lofts DPR on file  agreed to services and verbal consent obtained.   Follow up plan:  Telephone appointment with care coordination team member scheduled for:  05/26/22  Encounter Outcome:  Pt. Scheduled  [place]  Direct Dial: [phone]

## 2022-05-25 NOTE — Telephone Encounter (Signed)
argo, [name] E, NP  You1 hour ago (9:17 AM)    Orders signed.

## 2022-05-25 NOTE — Telephone Encounter (Signed)
Received message from [place], Referral Coordinator:  [name]  [name], [name], [place]; [name], CMA Hey I was working the F/up WQ and was told that these were entered incorrectly can one you assist with re-entering them, if pt still needs this service most are over 37 mths old. Brianna Barnes [id number] - This was sent by [phone] as well.  Can you send this one again as above using [id number]?     Pended Referral and sent to [name] for Approval.

## 2022-05-26 ENCOUNTER — Ambulatory Visit: Payer: Self-pay | Admitting: Licensed Clinical Social Worker

## 2022-05-26 NOTE — Patient Outreach (Signed)
  Care Coordination   Initial Visit Note   05/26/2022 Name: Brianna Barnes MRN: [id number] DOB: Feb 02, 1929  Brianna Barnes is a 89 y.o. year old female who sees Fargo, [name] E, NP for primary care. I spoke with  Mellody Drown Twining's adult children, Neoma Laming and Abbe Amsterdam, by phone today.  What matters to the patients health and wellness today?  Caregiver resources/Aid resources    Goals Addressed             This Visit's Progress    Obtain In Home Aid Support   On track    Activities and task to complete in order to accomplish goals.   Keep all upcoming appointments discussed today Continue with compliance of taking medication prescribed by Doctor Implement healthy coping skills discussed to assist with management of symptoms Review booklet ''When a loved one need long-term care"  Review private pay home care options provided and discussed         SDOH assessments and interventions completed:  Yes  SDOH Interventions Today    Flowsheet Row Most Recent Value  SDOH Interventions   Food Insecurity Interventions Intervention Not Indicated  Housing Interventions Intervention Not Indicated  Transportation Interventions Intervention Not Indicated        Care Coordination Interventions:  Yes, provided  Interventions Today    Flowsheet Row Most Recent Value  Chronic Disease   Chronic disease during today's visit Hypertension (HTN), Other  [Mild Dementia]  General Interventions   General Interventions Discussed/Reviewed General Interventions Discussed, Community Resources, Level of Care  [LCSW introduced self and explained care coordination services. Discussed informative resources for caregivers]  Level of [place]  [LCSW discussed various levels of care and provided resources via [place] Discussed/Reviewed --  [Acknowledged caregiver strain and discussed strategies to support  family]  Safety Interventions   Safety Discussed/Reviewed Safety Discussed  [Patient has DME in home to assist with decreasing fall risk]       Follow up plan: Follow up call scheduled for 2 weeks    Encounter Outcome:  Pt. Visit Completed   Christa See, MSW, [email or web address] Phone [phone] 4:42 PM

## 2022-05-26 NOTE — Patient Instructions (Signed)
Visit Information  Thank you for taking time to visit with me today. Please don't hesitate to contact me if I can be of assistance to you.   Following are the goals we discussed today:   Goals Addressed             This Visit's Progress    Obtain In Home Aid Support   On track    Activities and task to complete in order to accomplish goals.   Keep all upcoming appointments discussed today Continue with compliance of taking medication prescribed by Doctor Implement healthy coping skills discussed to assist with management of symptoms Review booklet ''When a loved one need long-term care"  Review private pay home care options provided and discussed         Our next appointment is by telephone on 3/27 at 3 PM  Please call the care guide team at [phone] if you need to cancel or reschedule your appointment.   If you are experiencing a Mental Health or [place] or need someone to talk to, please call the Suicide and Crisis Lifeline: 988 call 911   Patient verbalizes understanding of instructions and care plan provided today and agrees to view in [place]. Active MyChart status and patient understanding of how to access instructions and care plan via MyChart confirmed with patient.     Christa See, MSW, [email or web address] Phone [phone] 4:42 PM

## 2022-06-02 ENCOUNTER — Telehealth: Payer: Self-pay

## 2022-06-02 ENCOUNTER — Emergency Department (HOSPITAL_COMMUNITY): Payer: Medicare Other

## 2022-06-02 ENCOUNTER — Emergency Department (HOSPITAL_COMMUNITY)
Admission: EM | Admit: 2022-06-02 | Discharge: 2022-06-02 | Disposition: A | Discharge status: Home / Self Care | Payer: Medicare Other | Attending: Emergency Medicine | Admitting: Emergency Medicine

## 2022-06-02 ENCOUNTER — Encounter (HOSPITAL_COMMUNITY): Payer: Self-pay

## 2022-06-02 ENCOUNTER — Encounter: Payer: Self-pay | Admitting: Licensed Clinical Social Worker

## 2022-06-02 DIAGNOSIS — R0602 Shortness of breath: Secondary | ICD-10-CM | POA: Diagnosis not present

## 2022-06-02 DIAGNOSIS — R6889 Other general symptoms and signs: Secondary | ICD-10-CM | POA: Diagnosis not present

## 2022-06-02 DIAGNOSIS — N3000 Acute cystitis without hematuria: Secondary | ICD-10-CM

## 2022-06-02 DIAGNOSIS — R4781 Slurred speech: Secondary | ICD-10-CM | POA: Diagnosis not present

## 2022-06-02 DIAGNOSIS — Z743 Need for continuous supervision: Secondary | ICD-10-CM | POA: Diagnosis not present

## 2022-06-02 DIAGNOSIS — R531 Weakness: Secondary | ICD-10-CM

## 2022-06-02 DIAGNOSIS — I10 Essential (primary) hypertension: Secondary | ICD-10-CM

## 2022-06-02 DIAGNOSIS — I499 Cardiac arrhythmia, unspecified: Secondary | ICD-10-CM | POA: Diagnosis not present

## 2022-06-02 LAB — CBC
HCT: 40.7 % (ref 36.0–46.0)
Hemoglobin: 13.2 g/dL (ref 12.0–15.0)
MCH: 32 pg (ref 26.0–34.0)
MCHC: 32.4 g/dL (ref 30.0–36.0)
MCV: 98.5 fL (ref 80.0–100.0)
Platelets: 86 10*3/uL — ABNORMAL LOW (ref 150–400)
RBC: 4.13 MIL/uL (ref 3.87–5.11)
RDW: 14.3 % (ref 11.5–15.5)
WBC: 4 10*3/uL (ref 4.0–10.5)
nRBC: 0 % (ref 0.0–0.2)

## 2022-06-02 LAB — URINALYSIS, ROUTINE W REFLEX MICROSCOPIC
Bilirubin Urine: NEGATIVE
Glucose, UA: NEGATIVE mg/dL
Ketones, ur: NEGATIVE mg/dL
Nitrite: NEGATIVE
Protein, ur: NEGATIVE mg/dL
Specific Gravity, Urine: 1.011 (ref 1.005–1.030)
pH: 8 (ref 5.0–8.0)

## 2022-06-02 LAB — BRAIN NATRIURETIC PEPTIDE: B Natriuretic Peptide: 234.5 pg/mL — ABNORMAL HIGH (ref 0.0–100.0)

## 2022-06-02 LAB — HEPATIC FUNCTION PANEL
ALT: 27 U/L (ref 0–44)
AST: 55 U/L — ABNORMAL HIGH (ref 15–41)
Albumin: 2.9 g/dL — ABNORMAL LOW (ref 3.5–5.0)
Alkaline Phosphatase: 107 U/L (ref 38–126)
Bilirubin, Direct: 0.4 mg/dL — ABNORMAL HIGH (ref 0.0–0.2)
Indirect Bilirubin: 1.2 mg/dL — ABNORMAL HIGH (ref 0.3–0.9)
Total Bilirubin: 1.6 mg/dL — ABNORMAL HIGH (ref 0.3–1.2)
Total Protein: 5.6 g/dL — ABNORMAL LOW (ref 6.5–8.1)

## 2022-06-02 LAB — BASIC METABOLIC PANEL
Anion gap: 9 (ref 5–15)
BUN: 9 mg/dL (ref 8–23)
CO2: 24 mmol/L (ref 22–32)
Calcium: 7.9 mg/dL — ABNORMAL LOW (ref 8.9–10.3)
Chloride: 108 mmol/L (ref 98–111)
Creatinine, Ser: 0.61 mg/dL (ref 0.44–1.00)
GFR, Estimated: >60 mL/min (ref >60)
Glucose, Bld: 97 mg/dL (ref 70–99)
Potassium: 3.9 mmol/L (ref 3.5–5.1)
Sodium: 141 mmol/L (ref 135–145)

## 2022-06-02 LAB — CBG MONITORING, ED: Glucose-Capillary: 90 mg/dL (ref 70–99)

## 2022-06-02 LAB — TROPONIN I (HIGH SENSITIVITY): Troponin I (High Sensitivity): 11 ng/L (ref <18)

## 2022-06-02 MED ORDER — HYDROCHLOROTHIAZIDE 25 MG PO TABS: 12.5000 mg | ORAL_TABLET | Freq: Every day | ORAL | 0 refills | Status: DC

## 2022-06-02 MED ORDER — CEPHALEXIN 500 MG PO CAPS: 500.0000 mg | ORAL_CAPSULE | Freq: Four times a day (QID) | ORAL | 0 refills | Status: DC

## 2022-06-02 NOTE — ED Triage Notes (Addendum)
Pt bib ems from home; family reports increase in generalized weakness x 2 days; 2 episodes of slurred speech lasting a few minutes one this am and one last night, resolved at this time; no other deficits; no stroke hx; pt denies pain; pt hard of hearing at baseline; eating and drinking normally, no recent illness, no recent falls; 12 lead showed ventricular pacing, infrequent PVCs; hypertensive, 204/90, HR 72, 98% RA, RR 20, cbg 103; some confusion, oriented to self, pt mentating at baseline per family

## 2022-06-02 NOTE — Patient Outreach (Signed)
  Care Coordination   Follow Up Visit Note   06/02/2022 Name: Brianna Barnes MRN: [id number] DOB: 1928-12-05  Brianna Barnes is a 89 y.o. year old female who sees [place], [name] E, NP for primary care. I  did not engage with patient during this encounter  What matters to the patients health and wellness today?  Resources   SDOH assessments and interventions completed:  No     Care Coordination Interventions:  Yes, provided  Interventions Today    Flowsheet Row Most Recent Value  General Interventions   General Interventions Discussed/Reviewed Community Resources  [place] home care agencies]       Follow up plan: Follow up call scheduled for 1-2 weeks    Encounter Outcome:  Pt. Visit Completed   Christa See, MSW, [email or web address] Phone [phone] 4:45 PM

## 2022-06-02 NOTE — ED Provider Notes (Signed)
Family request to have a bedside commode and appear awake for patient to use.  I have reached out to I will transition of care team to help facilitate with obtaining these important DME.   Domenic Moras, PA-C 06/02/22 1739    Fransico Meadow, MD 06/04/22 1106

## 2022-06-02 NOTE — Telephone Encounter (Addendum)
Patient's son Abbe Amsterdam called with concerns about mother's condition. He states that he thinks that patient may have had a stroke. He states that patient has slurred speech and is very weak. I verbally consulted with Marlowe Sax, NP( since this was an urgent matter) to advise patient to call 911. I informed Abbe Amsterdam that he needs to call 911 and agreed. He thought that's what they needed to do, but just wanted to be sure.   Message routed to Marlowe Sax, NP and PCP [name], NP

## 2022-06-02 NOTE — Discharge Instructions (Signed)
1.  Start Keflex 4 times a day as prescribed. 2.  A urine culture will be done to determine if any antibiotic changes needed.  You will be called if there is a change needed. 3.  Your blood pressure has been elevating and you do have some swelling in the legs.  You are being started on a blood pressure medication called hydrochlorothiazide.  Take half a tablet daily.  Elevate your legs is much as possible.  See your doctor soon as possible for continued monitoring of your blood pressure and your response to this medication. 4.  Return to the emergency department if you develop a fever, worsening confusion or other concerning changes.

## 2022-06-02 NOTE — Telephone Encounter (Signed)
Noted  

## 2022-06-02 NOTE — ED Provider Notes (Addendum)
[facility] Provider Note   CSN: [id number] Arrival date & time: 06/02/22  1127     History  Chief Complaint  Patient presents with   Weakness    Brianna Barnes is a 89 y.o. female.  HPI Several weeks patient has some Inc. recent general weakness.  At baseline she can usually get up to assist in going to the bathroom.  She eats regularly although smaller amounts.  Patient has been having increasing confusion consistent with dementia that has advanced gradually.  Family members have noticed this has been worse over the past several days with more findings consistent with sundowning.  And asking more questions as in where is her furniture and where she going to sleep.  The most pronounced change has been the amount of general weakness and decreased ability for mobility.  The patient does not have any acute complaints.  She is alert and very hard of hearing.  She is denying being in pain.  But at baseline would be a poor historian.  Patient is daughter reports that over a number of months patient's blood pressure has been incrementally climbing.  She has not been started on any blood pressure medications.    Home Medications Prior to Admission medications   Medication Sig Start Date End Date Taking? Authorizing Provider  hydrochlorothiazide (HYDRODIURIL) 25 MG tablet Take 0.5 tablets (12.5 mg total) by mouth daily. 06/02/22  Yes Charlesetta Shanks, MD  Calcium-Phosphorus-Vitamin D (CALCIUM GUMMIES PO) Take 2 tablets by mouth daily.    [provider]  cetirizine (ZYRTEC) 10 MG tablet Take 10 mg by mouth daily as needed for allergies.    [provider]  Cholecalciferol (VITAMIN D3) 50 MCG (2000 UT) CAPS Take 1 capsule by mouth daily in the afternoon.    [provider]  denosumab (PROLIA) 60 MG/ML SOSY injection Inject 60 mg into the skin every 6 (six) months.    [provider]  Iron, Ferrous Sulfate, 325  (65 Fe) MG TABS Take 325 mg by mouth every other day. 12/25/20   Fargo, [name] E, NP  memantine (NAMENDA) 10 MG tablet TAKE 1 TABLET BY MOUTH TWICE A DAY 05/14/22   Fargo, [name] E, NP  pantoprazole (PROTONIX) 20 MG tablet TAKE 1 TABLET (20 MG TOTAL) BY MOUTH DAILY BEFORE SUPPER. 05/04/22   Fargo, [name] E, NP  pantoprazole (PROTONIX) 40 MG tablet TAKE 1 TABLET BY MOUTH EVERY DAY BEFORE BREAKFAST 05/14/22   Fargo, [name] E, NP  sennosides-docusate sodium (SENOKOT-S) 8.6-50 MG tablet Take 1 tablet by mouth daily as needed for constipation.    [provider]  timolol (TIMOPTIC) 0.5 % ophthalmic solution Place 1 drop into both eyes 2 (two) times daily.  01/22/15   [provider]  traMADol (ULTRAM) 50 MG tablet Take 1 tablet (50 mg total) by mouth every 6 (six) hours as needed for moderate pain. 04/27/22   Ngetich, Dinah C, NP  traZODone (DESYREL) 100 MG tablet TAKE 1 TABLET BY MOUTH EVERYDAY AT BEDTIME 05/04/22   Fargo, [name] E, NP  vitamin E 400 UNIT capsule Take 400 Units by mouth daily.    [provider]      Allergies    Patient has no known allergies.    Review of Systems   Review of Systems  Physical Exam Updated Vital Signs BP (!) 157/83   Pulse 75   Temp 98.1 F (36.7 C) (Oral)   Resp 17   SpO2  99%  Physical Exam Constitutional:      Comments: Alert.  No acute distress.  Good physical condition for age.  HENT:     Mouth/Throat:     Pharynx: Oropharynx is clear.  Eyes:     Extraocular Movements: Extraocular movements intact.  Cardiovascular:     Rate and Rhythm: Normal rate and regular rhythm.  Pulmonary:     Effort: Pulmonary effort is normal.     Breath sounds: Normal breath sounds.  Abdominal:     General: There is no distension.     Palpations: Abdomen is soft.     Tenderness: There is no abdominal tenderness. There is no guarding.  Musculoskeletal:     Comments: 1+ edema bilateral lower extremities.  Calves soft and nontender.  Skin:    General: Skin is  warm and dry.  Neurological:     Comments: Patient is alert.  She is very hard of hearing.  She assistant following commands.  No focal motor deficits.  Psychiatric:     Comments: Pleasant and calm     ED Results / Procedures / Treatments   Labs (all labs ordered are listed, but only abnormal results are displayed) Labs Reviewed  BASIC METABOLIC PANEL - Abnormal; Notable for the following components:      Result Value   Calcium 7.9 (*)    All other components within normal limits  CBC - Abnormal; Notable for the following components:   Platelets 86 (*)    All other components within normal limits  HEPATIC FUNCTION PANEL - Abnormal; Notable for the following components:   Total Protein 5.6 (*)    Albumin 2.9 (*)    AST 55 (*)    Total Bilirubin 1.6 (*)    Bilirubin, Direct 0.4 (*)    Indirect Bilirubin 1.2 (*)    All other components within normal limits  BRAIN NATRIURETIC PEPTIDE - Abnormal; Notable for the following components:   B Natriuretic Peptide 234.5 (*)    All other components within normal limits  URINALYSIS, ROUTINE W REFLEX MICROSCOPIC  CBG MONITORING, ED  TROPONIN I (HIGH SENSITIVITY)    EKG EKG Interpretation  Date/Time:  Wednesday June 02 2022 11:37:22 EDT Ventricular Rate:  75 PR Interval:  224 QRS Duration: 164 QT Interval:  480 QTC Calculation: 537 R Axis:   -55 Text Interpretation: Sinus rhythm Atrial premature complexes in couplets Prolonged PR interval LVH with IVCD, LAD and secondary repol abnrm Prolonged QT interval no sig change from previous Confirmed by Charlesetta Shanks [phone]) on 06/02/2022 3:46:46 PM  Radiology DG Chest Port 1 View  Result Date: 06/02/2022 CLINICAL DATA:  AMS EXAM: PORTABLE CHEST - 1 VIEW COMPARISON:  04/17/2021 FINDINGS: Cardiac silhouette is prominent. There is pulmonary interstitial prominence with vascular congestion. No focal consolidation. No pneumothorax or pleural effusion identified. Aorta is calcified. There is a  left-sided pacer. IMPRESSION: Findings suggest CHF. Electronically Signed   By: Sammie Bench M.D.   On: 06/02/2022 13:34    Procedures Procedures    Medications Ordered in ED Medications - No data to display  ED Course/ Medical Decision Making/ A&P                             Medical Decision Making Amount and/or Complexity of Data Reviewed Labs: ordered. Radiology: ordered.  Risk Prescription drug management.   Has been experiencing some increasing general weakness and this change over several weeks to months with  more acute worsening over the past few days.  Differential diagnosis including metabolic derangement\infectious etiology such as pneumonia or UTI\CVA\advancing dementia.  Will proceed with broad diagnostic evaluation.  Clinically the patient is alert.  She is not in acute distress.  Her vital signs are stable.  Metabolic panel within normal limits.  CBC normal except platelets 86k.  Troponin 11.  X-ray interpreted radiology and also visually reviewed by myself mild vascular congestion and no focal consolidations.  With patient's incrementally elevating blood pressure over the past number of months, mild vascular congestion and mild peripheral edema, I suspect an element of CHF.  She is not in any distress and does not have hypoxia.  Will plan on starting the patient on hydrochlorothiazide for home use and close monitoring by PCP.  UA is pending.  If urinalysis positive will anticipate treating for UTI.  Clinically patient is stable for outpatient management with family members.  Urinalysis positive WBC count 21-50 with large leuk esterase and good specimen.  At this time will start on Keflex 4 times daily.  Patient has excellent care at home with family members.  They will follow-up with PCP.  Final Clinical Impression(s) / ED Diagnoses Final diagnoses:  Generalized weakness  Essential hypertension    Rx / DC Orders ED Discharge Orders          Ordered     hydrochlorothiazide (HYDRODIURIL) 25 MG tablet  Daily        06/02/22 1559              Charlesetta Shanks, MD 06/02/22 1644    Charlesetta Shanks, MD 06/02/22 1703

## 2022-06-03 ENCOUNTER — Telehealth: Payer: Self-pay

## 2022-06-03 NOTE — Telephone Encounter (Signed)
Kenney Houseman with [facility] says that patient's son called and requested hospice services for patient. She also says that he wanted to know if [name], NP would be willing to be the provider to follow and sign orders for patient. Patient is scheduled for hospice admission appointment tomorrow at 10am. Please advise.  Message sent to [name], NP

## 2022-06-03 NOTE — Telephone Encounter (Signed)
Left message for Kenney Houseman letting her know that [name], NP will be the attending.

## 2022-06-09 ENCOUNTER — Ambulatory Visit: Payer: Self-pay | Admitting: Licensed Clinical Social Worker

## 2022-06-10 NOTE — Patient Outreach (Signed)
  Care Coordination   Follow Up Visit Note   06/09/22 Name: Brianna Barnes MRN: [id number] DOB: Aug 29, 1928  Brianna Barnes is a 89 y.o. year old female who sees Fargo, [name] E, NP for primary care. I spoke with  Brianna Barnes by phone today.  What matters to the patients health and wellness today?  Symptom Management    Goals Addressed             This Visit's Progress    COMPLETED: Obtain In Home Aid Support   On track    Activities and task to complete in order to accomplish goals.   Keep all upcoming appointments discussed today Continue with compliance of taking medication prescribed by Doctor Implement healthy coping skills discussed to assist with management of symptoms         SDOH assessments and interventions completed:  No     Care Coordination Interventions:  Yes, provided  Interventions Today    Flowsheet Row Most Recent Value  General Interventions   General Interventions Discussed/Reviewed General Interventions Reviewed  [Pt has established with hospice. Family has met with Chaplain, SW, and Therapist, sports. Provided DME to promote safety.]  Level of Care Applications  Applications Medicaid  [Family has submitted LTC Medicaid application]  Education Interventions   Applications Medicaid  [Family has submitted LTC Medicaid application]  Mental Health Interventions   Mental Health Discussed/Reviewed Coping Strategies  [Patient has a strong support system]       Follow up plan: No further intervention required.   Encounter Outcome:  Pt. Visit Completed   Christa See, MSW, [email or web address] Phone [phone] 12:24 PM

## 2022-06-10 NOTE — Patient Instructions (Signed)
Visit Information  Thank you for taking time to visit with me today. Please don't hesitate to contact me if I can be of assistance to you.   Following are the goals we discussed today:   Goals Addressed             This Visit's Progress    COMPLETED: Obtain In Home Aid Support   On track    Activities and task to complete in order to accomplish goals.   Keep all upcoming appointments discussed today Continue with compliance of taking medication prescribed by Doctor Implement healthy coping skills discussed to assist with management of symptoms        If you are experiencing a Mental Health or [place] or need someone to talk to, please call the Suicide and Crisis Lifeline: 988 call 911   Patient verbalizes understanding of instructions and care plan provided today and agrees to view in [place]. Active MyChart status and patient understanding of how to access instructions and care plan via MyChart confirmed with patient.     No further follow up required: Patient has completed all goals associated with [place], MSW, [email or web address] Phone [phone] 12:25 PM

## 2022-06-29 ENCOUNTER — Ambulatory Visit (INDEPENDENT_AMBULATORY_CARE_PROVIDER_SITE_OTHER)

## 2022-06-29 DIAGNOSIS — I441 Atrioventricular block, second degree: Secondary | ICD-10-CM | POA: Diagnosis not present

## 2022-06-29 LAB — CUP PACEART REMOTE DEVICE CHECK
Battery Remaining Longevity: 94 mo
Battery Remaining Percentage: 87 %
Battery Voltage: 3.01 V
Brady Statistic AP VP Percent: 2.9 %
Brady Statistic AP VS Percent: 1 %
Brady Statistic AS VP Percent: 93 %
Brady Statistic AS VS Percent: 3.6 %
Brady Statistic RA Percent Paced: 3 %
Brady Statistic RV Percent Paced: 96 %
Date Time Interrogation Session: 20240416020011
Implantable Lead Connection Status: 753985
Implantable Lead Connection Status: 753985
Implantable Lead Implant Date: 20221004
Implantable Lead Implant Date: 20221004
Implantable Lead Location: 753859
Implantable Lead Location: 753860
Implantable Pulse Generator Implant Date: 20221004
Lead Channel Impedance Value: 400 Ohm
Lead Channel Impedance Value: 610 Ohm
Lead Channel Pacing Threshold Amplitude: 0.5 V
Lead Channel Pacing Threshold Amplitude: 0.75 V
Lead Channel Pacing Threshold Pulse Width: 0.5 ms
Lead Channel Pacing Threshold Pulse Width: 0.5 ms
Lead Channel Sensing Intrinsic Amplitude: 2.6 mV
Lead Channel Sensing Intrinsic Amplitude: 6.6 mV
Lead Channel Setting Pacing Amplitude: 2 V
Lead Channel Setting Pacing Amplitude: 2.5 V
Lead Channel Setting Pacing Pulse Width: 0.5 ms
Lead Channel Setting Sensing Sensitivity: 2 mV
Pulse Gen Model: 2272
Pulse Gen Serial Number: 3964696

## 2022-07-26 ENCOUNTER — Other Ambulatory Visit: Payer: Self-pay

## 2022-07-26 DIAGNOSIS — M159 Polyosteoarthritis, unspecified: Secondary | ICD-10-CM

## 2022-07-26 MED ORDER — TRAMADOL HCL 50 MG PO TABS: 50.0000 mg | ORAL_TABLET | Freq: Four times a day (QID) | ORAL | 2 refills | Status: DC | PRN

## 2022-07-26 NOTE — Telephone Encounter (Signed)
Toney Sang, Nurse with [facility] called stating that patient needs refill on tramadol 50mg  tablet. Medication last refilled 05/04/2022.  Medication pended and sent to Hazle Nordmann, NP

## 2022-08-02 NOTE — Progress Notes (Signed)
Remote pacemaker transmission.   

## 2022-08-03 ENCOUNTER — Telehealth: Payer: Self-pay | Admitting: *Deleted

## 2022-08-03 NOTE — Telephone Encounter (Signed)
[employer] Prior Authorization Submitted and APPROVED 08/03/2022-08/03/2023 Authorization #: [id number]  Copy of Authorization sent to [place].

## 2022-08-10 ENCOUNTER — Telehealth: Payer: Self-pay | Admitting: Internal Medicine

## 2022-08-10 NOTE — Telephone Encounter (Signed)
I spoke with the patient son to let him know that the monitor can go to the facility with her.

## 2022-08-10 NOTE — Telephone Encounter (Signed)
Contacted patient to schedule past due 9 month pacemaker check with Dr. Graciela Husbands, spoke with son, Adria Devon, which is on DPR, and he states patient as of a month ago is bed ridden, and unable to come into the office. He states he and his family are in the process of transferring patient into a a skilled facility. Please call and advise of what they should do regarding patient's remote pacemaker transmission.

## 2022-08-12 ENCOUNTER — Telehealth: Payer: Medicare Other | Admitting: *Deleted

## 2022-08-12 NOTE — Telephone Encounter (Signed)
Ok to discontinue injections

## 2022-08-12 NOTE — Telephone Encounter (Signed)
Called patient to schedule a Prolia Injection for June and spoke with Son In [place], Michele Mcalpine and he stated that patient has gone [place] fast and unable to get out of bed now. Stated that it is hard to care for her and they are moving her into a Care Facility.   Family Requesting to discontinue the Prolia Injections.   Please Advise.

## 2022-08-16 DIAGNOSIS — K449 Diaphragmatic hernia without obstruction or gangrene: Secondary | ICD-10-CM | POA: Diagnosis not present

## 2022-08-16 DIAGNOSIS — I10 Essential (primary) hypertension: Secondary | ICD-10-CM | POA: Diagnosis not present

## 2022-08-16 DIAGNOSIS — I509 Heart failure, unspecified: Secondary | ICD-10-CM | POA: Diagnosis not present

## 2022-08-16 DIAGNOSIS — M810 Age-related osteoporosis without current pathological fracture: Secondary | ICD-10-CM | POA: Diagnosis not present

## 2022-08-16 DIAGNOSIS — R609 Edema, unspecified: Secondary | ICD-10-CM | POA: Diagnosis not present

## 2022-08-16 DIAGNOSIS — K219 Gastro-esophageal reflux disease without esophagitis: Secondary | ICD-10-CM | POA: Diagnosis not present

## 2022-08-16 DIAGNOSIS — I441 Atrioventricular block, second degree: Secondary | ICD-10-CM | POA: Diagnosis not present

## 2022-08-16 DIAGNOSIS — E785 Hyperlipidemia, unspecified: Secondary | ICD-10-CM | POA: Diagnosis not present

## 2022-08-17 DIAGNOSIS — E785 Hyperlipidemia, unspecified: Secondary | ICD-10-CM | POA: Diagnosis not present

## 2022-08-19 DIAGNOSIS — I509 Heart failure, unspecified: Secondary | ICD-10-CM | POA: Diagnosis not present

## 2022-08-20 DIAGNOSIS — K219 Gastro-esophageal reflux disease without esophagitis: Secondary | ICD-10-CM | POA: Diagnosis not present

## 2022-08-25 DIAGNOSIS — E8809 Other disorders of plasma-protein metabolism, not elsewhere classified: Secondary | ICD-10-CM | POA: Diagnosis not present

## 2022-08-25 DIAGNOSIS — E876 Hypokalemia: Secondary | ICD-10-CM | POA: Diagnosis not present

## 2022-08-27 DIAGNOSIS — M79621 Pain in right upper arm: Secondary | ICD-10-CM | POA: Diagnosis not present

## 2022-09-09 ENCOUNTER — Ambulatory Visit: Payer: Medicare Other | Admitting: Orthopedic Surgery

## 2022-09-21 DIAGNOSIS — R451 Restlessness and agitation: Secondary | ICD-10-CM | POA: Diagnosis not present

## 2022-09-21 DIAGNOSIS — Z5320 Procedure and treatment not carried out because of patient's decision for unspecified reasons: Secondary | ICD-10-CM | POA: Diagnosis not present

## 2022-09-26 DIAGNOSIS — M79621 Pain in right upper arm: Secondary | ICD-10-CM | POA: Diagnosis not present

## 2022-09-27 DIAGNOSIS — Z79899 Other long term (current) drug therapy: Secondary | ICD-10-CM | POA: Diagnosis not present

## 2022-09-28 ENCOUNTER — Ambulatory Visit: Payer: Medicare Other

## 2022-09-30 DIAGNOSIS — R634 Abnormal weight loss: Secondary | ICD-10-CM | POA: Diagnosis not present

## 2022-10-05 DIAGNOSIS — G4710 Hypersomnia, unspecified: Secondary | ICD-10-CM | POA: Diagnosis not present

## 2022-10-05 DIAGNOSIS — Z79899 Other long term (current) drug therapy: Secondary | ICD-10-CM | POA: Diagnosis not present

## 2022-10-06 DIAGNOSIS — W19XXXA Unspecified fall, initial encounter: Secondary | ICD-10-CM | POA: Diagnosis not present

## 2022-10-06 DIAGNOSIS — R531 Weakness: Secondary | ICD-10-CM | POA: Diagnosis not present

## 2022-10-13 DIAGNOSIS — G8929 Other chronic pain: Secondary | ICD-10-CM | POA: Diagnosis not present

## 2022-10-19 DIAGNOSIS — R52 Pain, unspecified: Secondary | ICD-10-CM | POA: Diagnosis not present

## 2022-11-14 DEATH — deceased

## 2022-12-28 ENCOUNTER — Ambulatory Visit: Payer: Medicare Other

## 2023-02-04 IMAGING — CR DG CHEST 2V
1 series · 2 of 2 positions shown · non-contrast
Comparison: Chest radiograph 01/21/2018

CLINICAL DATA: Shortness of breath.

EXAM:
CHEST - 2 VIEW

[Series 1: dg chest 2 view · 0.14mm/px · 2 of 2 slices shown]
[im 1/2]
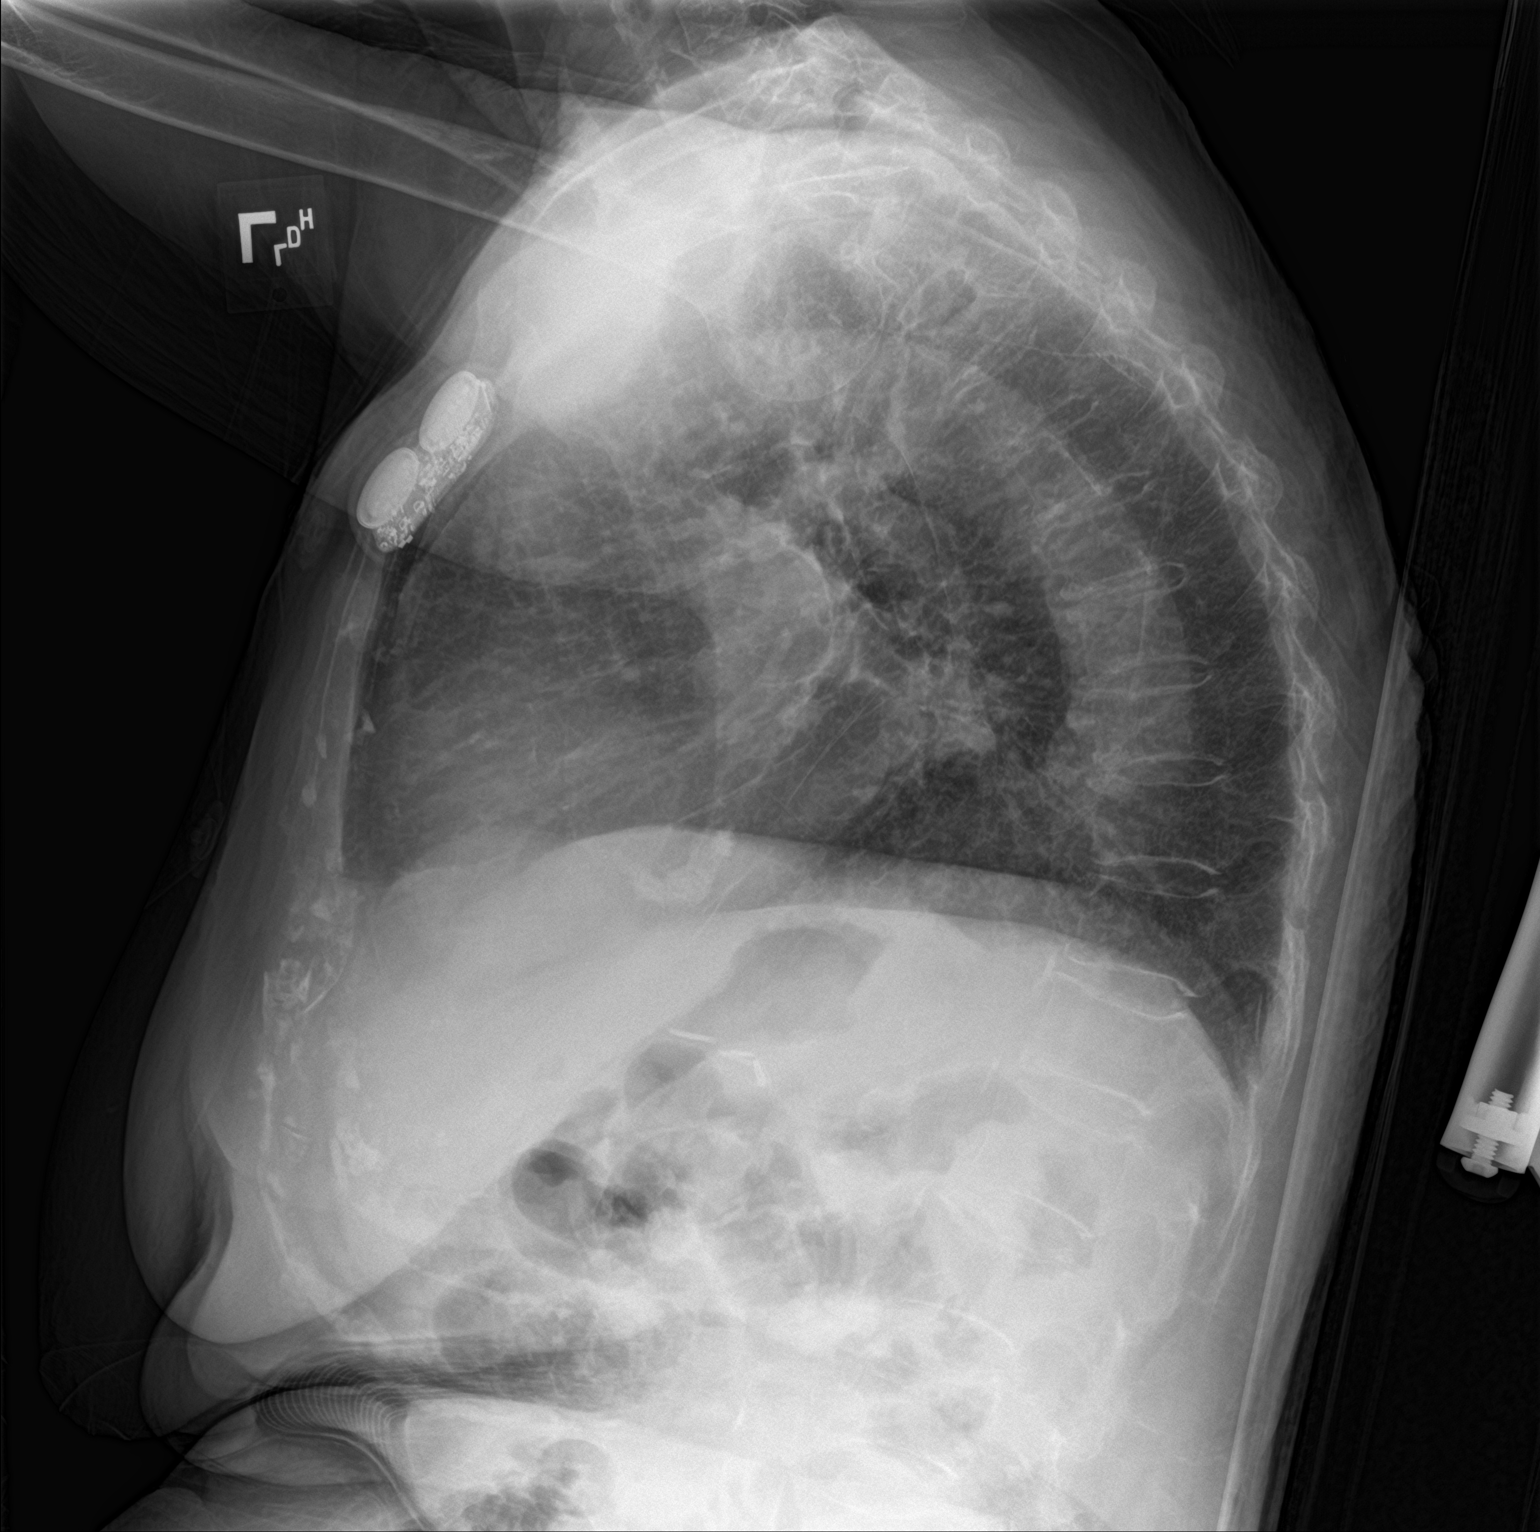
[im 2/2]
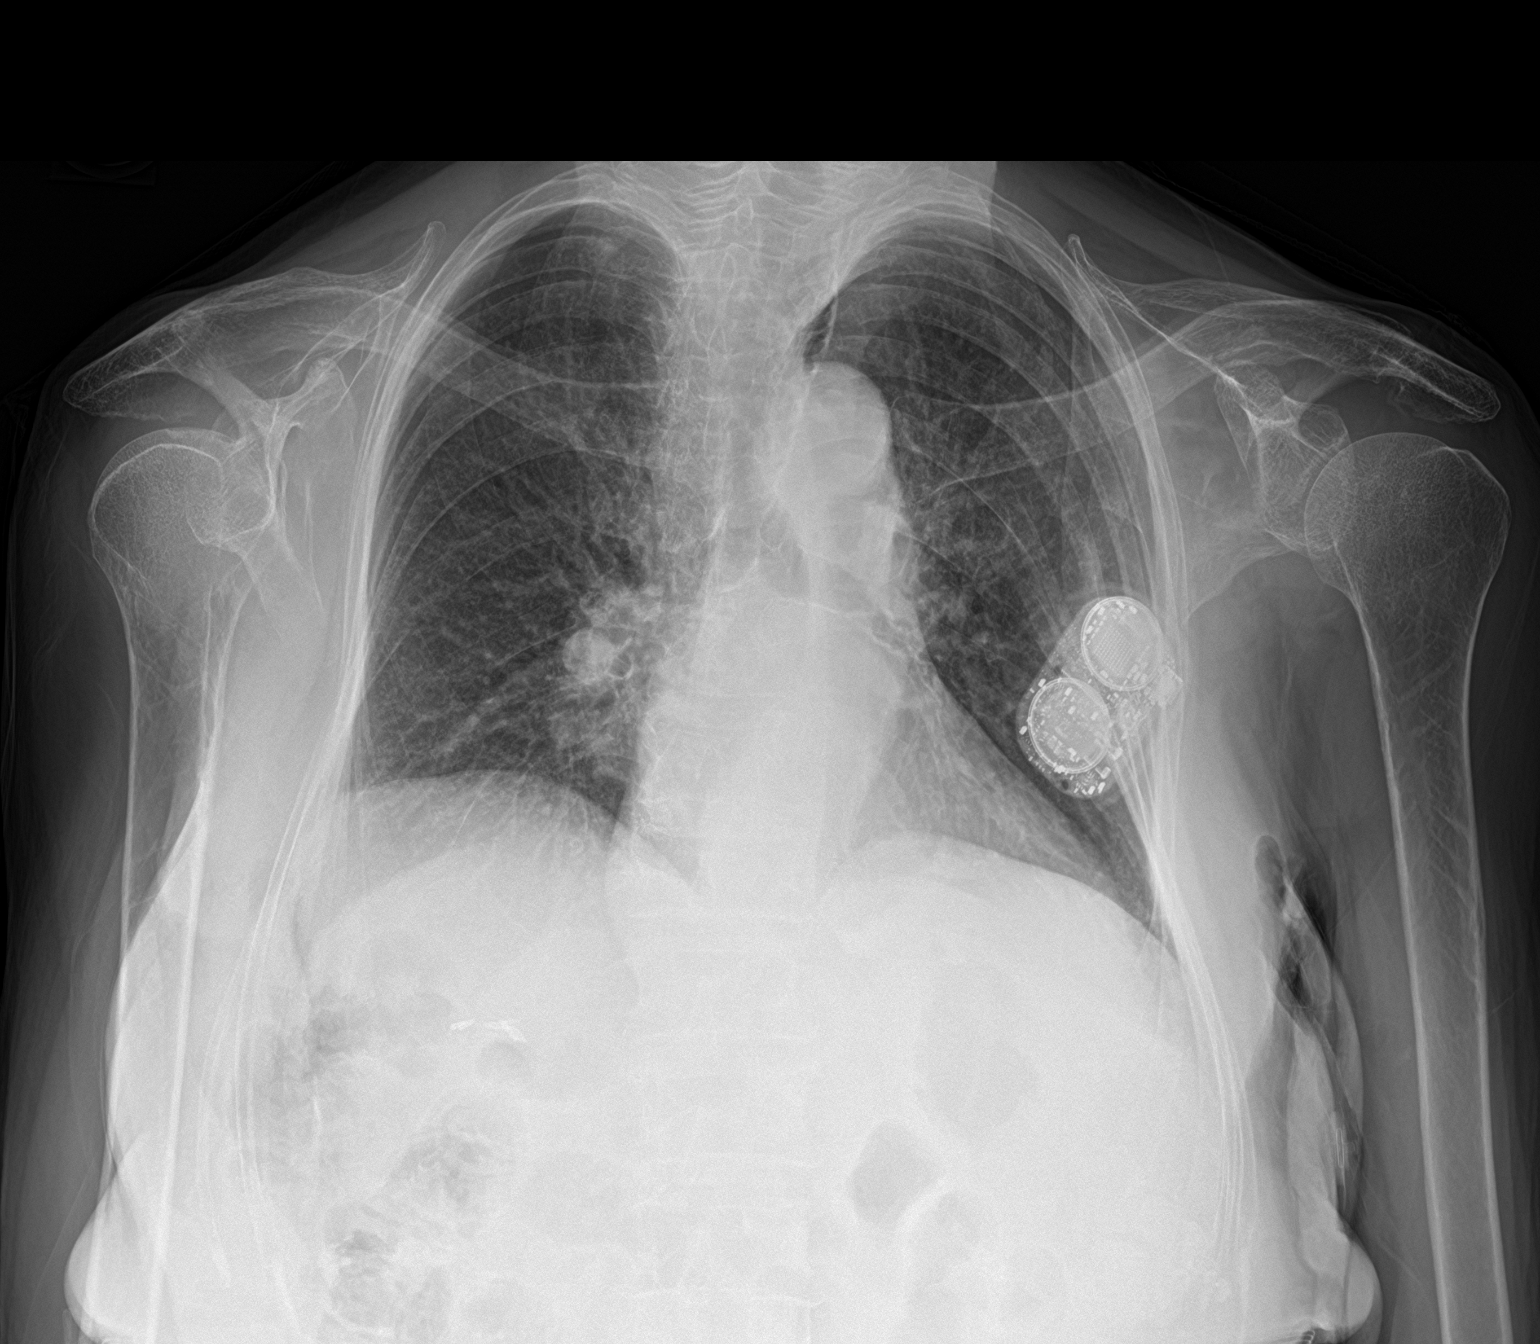

[2 of 2 positions shown; findings below may reference images not displayed]

FINDINGS: Stable enlarged cardiac and mediastinal contours. No large area
pulmonary consolidation. No pleural effusion or pneumothorax.
Cholecystectomy clips.
IMPRESSION: No acute cardiopulmonary process.

## 2023-02-07 IMAGING — DX DG CHEST 2V
2 series · 2 of 2 positions shown · non-contrast
Comparison: Chest radiograph 12/14/2020

CLINICAL DATA: Post pacemaker image

EXAM:
CHEST - 2 VIEW

[chest lat]
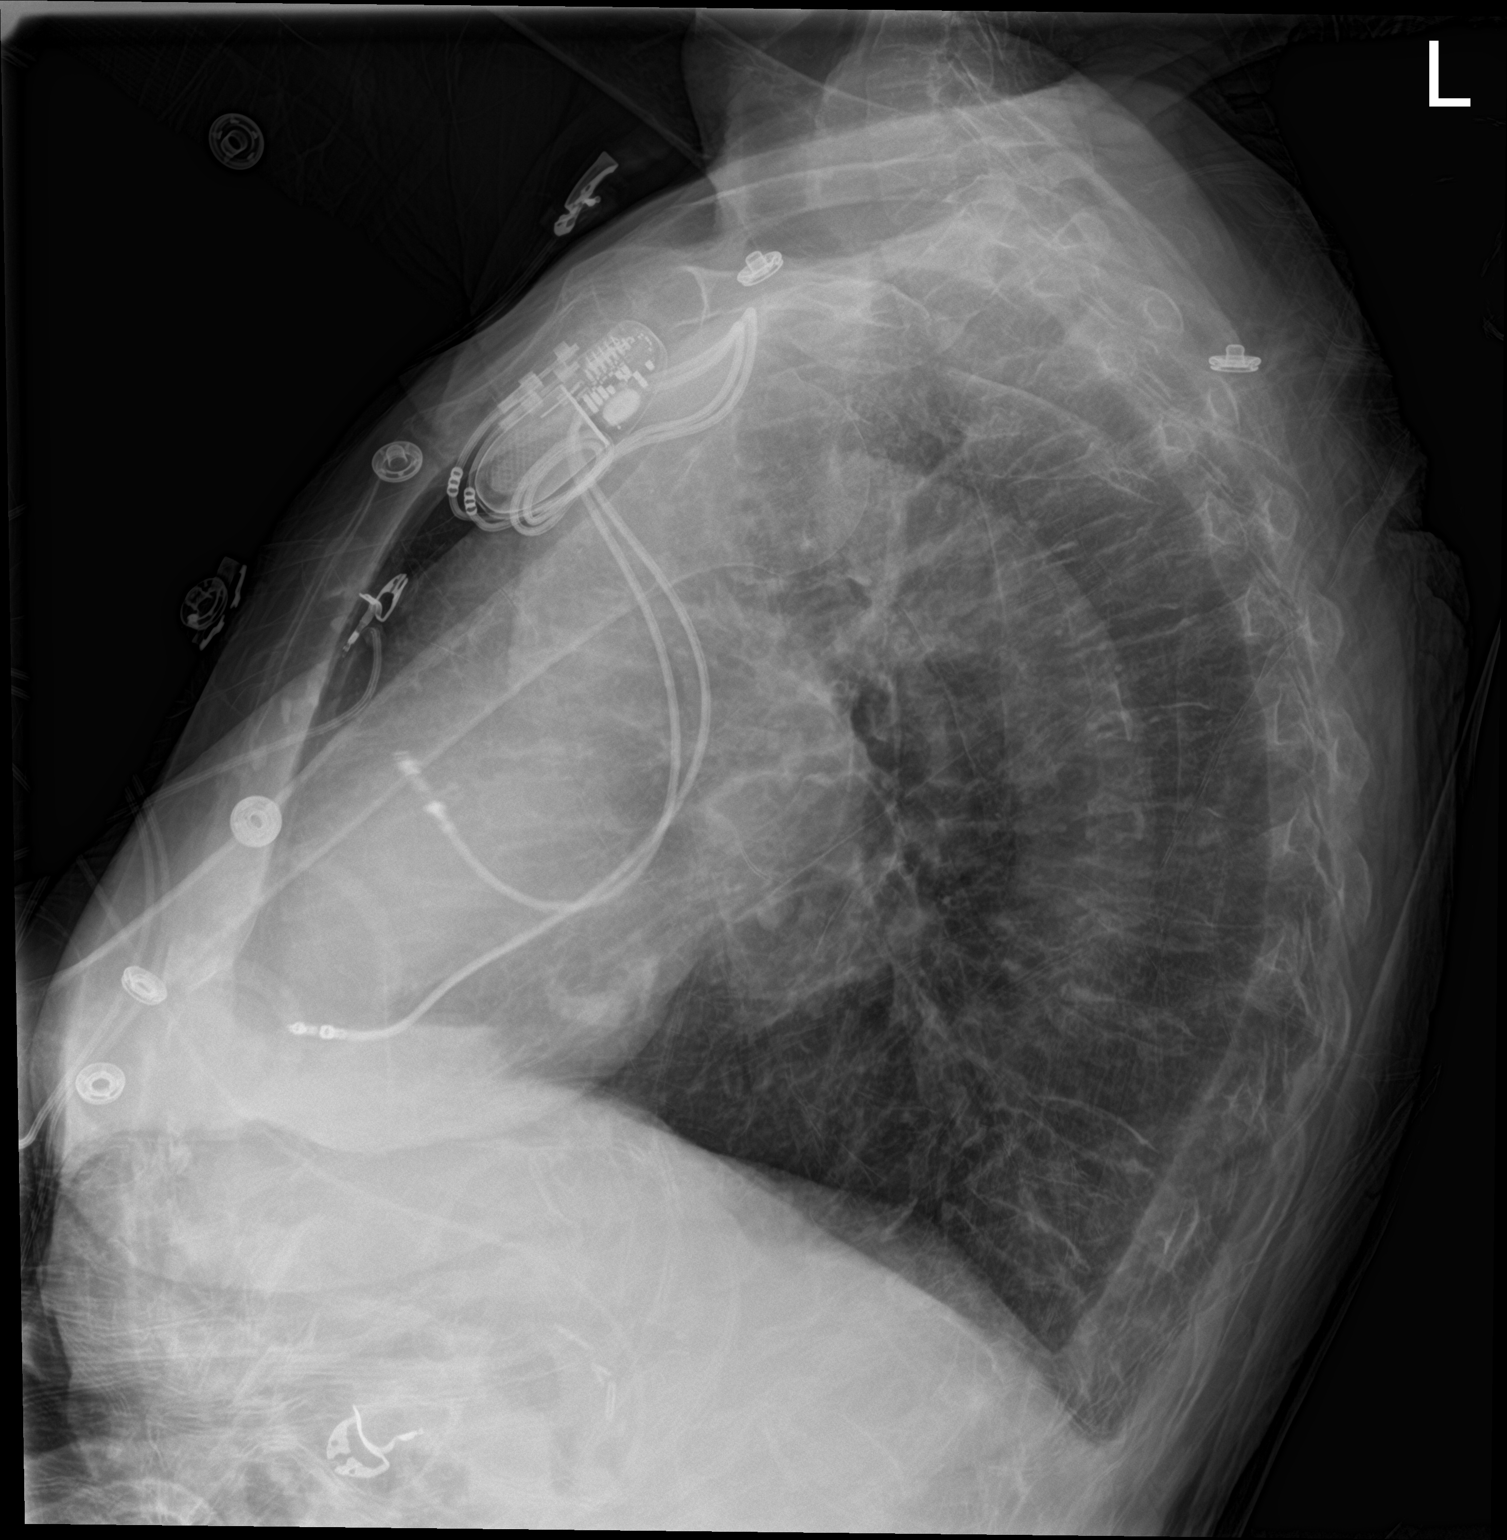

[chest ap]
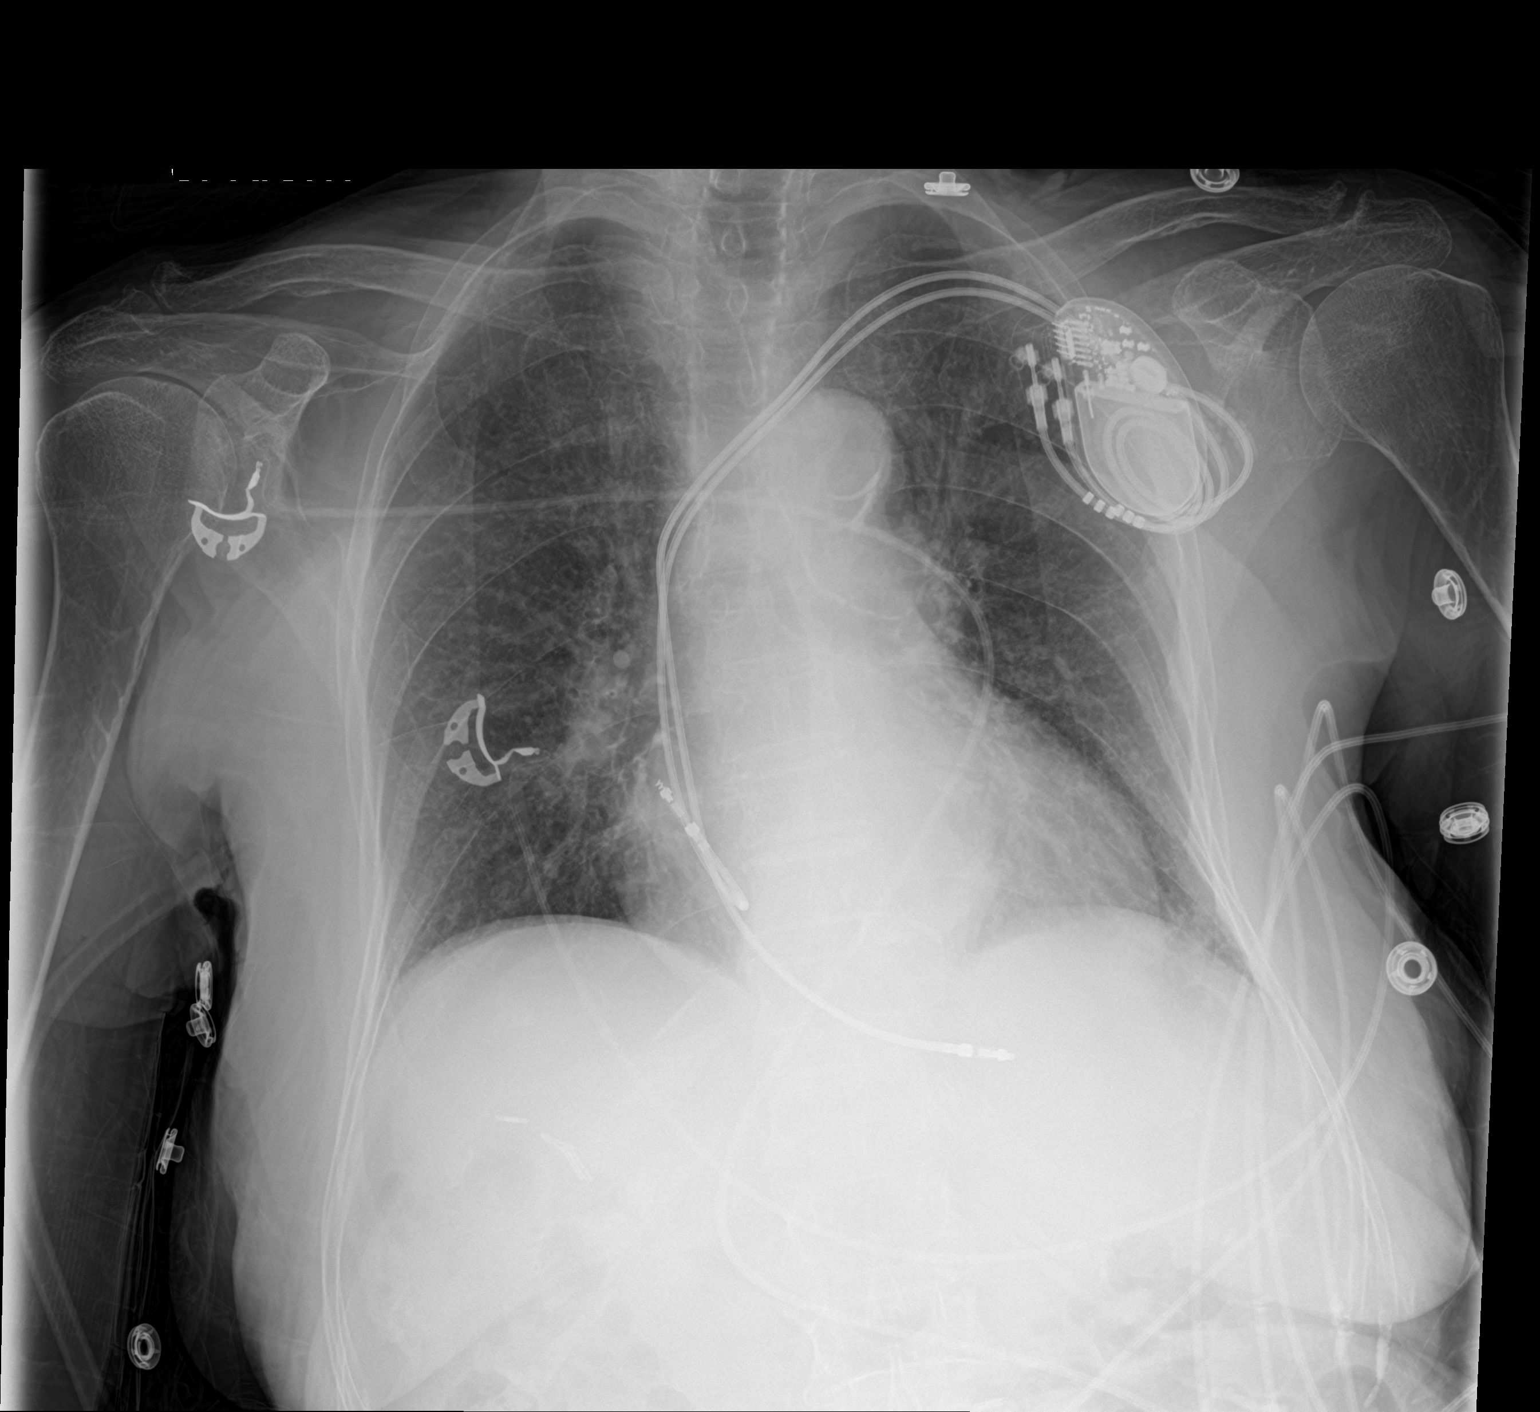

[2 of 2 positions shown; findings below may reference images not displayed]

FINDINGS: The previously seen left chest wall cardiac device has been removed,
and there is a new left chest wall pacemaker with leads terminating
over the expected locations of the right atrium and right ventricle.
There is no evidence of postprocedural complication.

The cardiomediastinal silhouette is stable. There is no new focal
airspace disease. There is no pleural effusion or pneumothorax.

The bones are stable. Right upper quadrant surgical clips are again
noted.
IMPRESSION: Interval placement of a left chest wall pacemaker without evidence
of complication.

## 2023-03-29 ENCOUNTER — Ambulatory Visit: Payer: Medicare Other
